# Patient Record
Sex: Male | Born: 1960 | Race: Black or African American | Hispanic: No | Marital: Married | Smoking: Current some day smoker
Health system: Southern US, Community
[De-identification: ages and names within clinical notes are randomized; demographics above are authoritative.]

## PROBLEM LIST (undated history)

## (undated) DIAGNOSIS — T7840XA Allergy, unspecified, initial encounter: Secondary | ICD-10-CM

## (undated) DIAGNOSIS — M1990 Unspecified osteoarthritis, unspecified site: Secondary | ICD-10-CM

## (undated) DIAGNOSIS — I10 Essential (primary) hypertension: Secondary | ICD-10-CM

## (undated) DIAGNOSIS — I639 Cerebral infarction, unspecified: Secondary | ICD-10-CM

## (undated) DIAGNOSIS — E119 Type 2 diabetes mellitus without complications: Secondary | ICD-10-CM

## (undated) HISTORY — DX: Unspecified osteoarthritis, unspecified site: M19.90

## (undated) HISTORY — DX: Allergy, unspecified, initial encounter: T78.40XA

## (undated) HISTORY — PX: FRACTURE SURGERY: SHX138

## (undated) HISTORY — DX: Essential (primary) hypertension: I10

## (undated) HISTORY — DX: Type 2 diabetes mellitus without complications: E11.9

---

## 2000-12-17 ENCOUNTER — Emergency Department (HOSPITAL_COMMUNITY): Admission: EM | Admit: 2000-12-17 | Discharge: 2000-12-17 | Payer: Self-pay | Admitting: Emergency Medicine

## 2001-05-11 ENCOUNTER — Emergency Department (HOSPITAL_COMMUNITY): Admission: EM | Admit: 2001-05-11 | Discharge: 2001-05-11 | Payer: Self-pay | Admitting: Emergency Medicine

## 2007-08-09 ENCOUNTER — Ambulatory Visit (HOSPITAL_COMMUNITY): Payer: Self-pay | Admitting: Marriage and Family Therapist

## 2007-12-04 ENCOUNTER — Emergency Department (HOSPITAL_COMMUNITY): Admission: EM | Admit: 2007-12-04 | Discharge: 2007-12-04 | Payer: Self-pay | Admitting: Emergency Medicine

## 2013-04-09 ENCOUNTER — Emergency Department (INDEPENDENT_AMBULATORY_CARE_PROVIDER_SITE_OTHER)
Admission: EM | Admit: 2013-04-09 | Discharge: 2013-04-09 | Disposition: A | Discharge status: Home / Self Care | Payer: Self-pay | Source: Home / Self Care | Attending: Emergency Medicine | Admitting: Emergency Medicine

## 2013-04-09 ENCOUNTER — Encounter (HOSPITAL_COMMUNITY): Payer: Self-pay | Admitting: Emergency Medicine

## 2013-04-09 DIAGNOSIS — T148XXA Other injury of unspecified body region, initial encounter: Secondary | ICD-10-CM

## 2013-04-09 DIAGNOSIS — Z23 Encounter for immunization: Secondary | ICD-10-CM

## 2013-04-09 DIAGNOSIS — IMO0002 Reserved for concepts with insufficient information to code with codable children: Secondary | ICD-10-CM

## 2013-04-09 MED ORDER — OXYCODONE-ACETAMINOPHEN 5-325 MG PO TABS: ORAL_TABLET | ORAL | Status: DC

## 2013-04-09 MED ORDER — HYDROCODONE-ACETAMINOPHEN 5-325 MG PO TABS
2.0000 | ORAL_TABLET | Freq: Once | ORAL | Status: AC
  Administered 2013-04-09: 2 via ORAL

## 2013-04-09 MED ORDER — AMOXICILLIN 500 MG PO CAPS: 500.0000 mg | ORAL_CAPSULE | Freq: Three times a day (TID) | ORAL | Status: DC

## 2013-04-09 MED ORDER — HYDROCODONE-ACETAMINOPHEN 5-325 MG PO TABS
ORAL_TABLET | ORAL | Status: AC
  Filled 2013-04-09: qty 2

## 2013-04-09 MED ORDER — TETANUS-DIPHTH-ACELL PERTUSSIS 5-2.5-18.5 LF-MCG/0.5 IM SUSP
0.5000 mL | Freq: Once | INTRAMUSCULAR | Status: AC
  Administered 2013-04-09: 0.5 mL via INTRAMUSCULAR

## 2013-04-09 MED ORDER — TETANUS-DIPHTH-ACELL PERTUSSIS 5-2.5-18.5 LF-MCG/0.5 IM SUSP
INTRAMUSCULAR | Status: AC
  Filled 2013-04-09: qty 0.5

## 2013-04-09 NOTE — Discharge Instructions (Signed)
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
## 2013-04-15 ENCOUNTER — Encounter (HOSPITAL_COMMUNITY): Payer: Self-pay | Admitting: Emergency Medicine

## 2013-04-15 ENCOUNTER — Emergency Department (INDEPENDENT_AMBULATORY_CARE_PROVIDER_SITE_OTHER)
Admission: EM | Admit: 2013-04-15 | Discharge: 2013-04-15 | Disposition: A | Discharge status: Home / Self Care | Payer: Self-pay | Source: Home / Self Care | Attending: Emergency Medicine | Admitting: Emergency Medicine

## 2013-04-15 DIAGNOSIS — S01511A Laceration without foreign body of lip, initial encounter: Secondary | ICD-10-CM

## 2013-04-15 DIAGNOSIS — Z4802 Encounter for removal of sutures: Secondary | ICD-10-CM

## 2013-04-15 MED ORDER — OXYCODONE-ACETAMINOPHEN 5-325 MG PO TABS: ORAL_TABLET | ORAL | Status: DC

## 2013-04-15 MED ORDER — CLINDAMYCIN HCL 300 MG PO CAPS: 300.0000 mg | ORAL_CAPSULE | Freq: Four times a day (QID) | ORAL | Status: DC
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
## 2013-04-15 NOTE — Discharge Instructions (Signed)
Wash with soap and water. Do not apply antibiotic ointment.   °

## 2013-04-15 NOTE — ED Notes (Signed)
Pt is here for a f/u and to have stitches removed Voices no new concerns... Alert w/no signs of acute distress.

## 2014-01-27 ENCOUNTER — Encounter (HOSPITAL_COMMUNITY): Payer: Self-pay | Admitting: Emergency Medicine

## 2014-01-27 ENCOUNTER — Emergency Department (HOSPITAL_COMMUNITY)
Admission: EM | Admit: 2014-01-27 | Discharge: 2014-01-28 | Disposition: A | Discharge status: Home / Self Care | Payer: Self-pay | Attending: Emergency Medicine | Admitting: Emergency Medicine

## 2014-01-27 ENCOUNTER — Emergency Department (HOSPITAL_COMMUNITY): Payer: Self-pay

## 2014-01-27 DIAGNOSIS — R109 Unspecified abdominal pain: Secondary | ICD-10-CM | POA: Insufficient documentation

## 2014-01-27 DIAGNOSIS — Z79899 Other long term (current) drug therapy: Secondary | ICD-10-CM | POA: Insufficient documentation

## 2014-01-27 DIAGNOSIS — K5900 Constipation, unspecified: Secondary | ICD-10-CM | POA: Insufficient documentation

## 2014-01-27 DIAGNOSIS — I10 Essential (primary) hypertension: Secondary | ICD-10-CM | POA: Insufficient documentation

## 2014-01-27 LAB — CBC WITH DIFFERENTIAL/PLATELET
Basophils Absolute: 0.1 10*3/uL (ref 0.0–0.1)
Basophils Relative: 1 % (ref 0–1)
Eosinophils Absolute: 0.2 10*3/uL (ref 0.0–0.7)
Eosinophils Relative: 2 % (ref 0–5)
HCT: 43.2 % (ref 39.0–52.0)
Hemoglobin: 14.5 g/dL (ref 13.0–17.0)
LYMPHS ABS: 3.3 10*3/uL (ref 0.7–4.0)
LYMPHS PCT: 35 % (ref 12–46)
MCH: 29.5 pg (ref 26.0–34.0)
MCHC: 33.6 g/dL (ref 30.0–36.0)
MCV: 88 fL (ref 78.0–100.0)
Monocytes Absolute: 1 10*3/uL (ref 0.1–1.0)
Monocytes Relative: 10 % (ref 3–12)
NEUTROS ABS: 4.9 10*3/uL (ref 1.7–7.7)
NEUTROS PCT: 52 % (ref 43–77)
PLATELETS: 343 10*3/uL (ref 150–400)
RBC: 4.91 MIL/uL (ref 4.22–5.81)
RDW: 13.6 % (ref 11.5–15.5)
WBC: 9.3 10*3/uL (ref 4.0–10.5)

## 2014-01-27 LAB — COMPREHENSIVE METABOLIC PANEL
ALK PHOS: 84 U/L (ref 39–117)
ALT: 16 U/L (ref 0–53)
AST: 15 U/L (ref 0–37)
Albumin: 3.8 g/dL (ref 3.5–5.2)
Anion gap: 13 (ref 5–15)
BUN: 6 mg/dL (ref 6–23)
CO2: 26 meq/L (ref 19–32)
Calcium: 9.1 mg/dL (ref 8.4–10.5)
Chloride: 98 mEq/L (ref 96–112)
Creatinine, Ser: 0.77 mg/dL (ref 0.50–1.35)
GLUCOSE: 282 mg/dL — AB (ref 70–99)
POTASSIUM: 3.9 meq/L (ref 3.7–5.3)
SODIUM: 137 meq/L (ref 137–147)
Total Bilirubin: 0.2 mg/dL — ABNORMAL LOW (ref 0.3–1.2)
Total Protein: 7.5 g/dL (ref 6.0–8.3)

## 2014-01-27 LAB — LIPASE, BLOOD: Lipase: 63 U/L — ABNORMAL HIGH (ref 11–59)

## 2014-01-27 MED ORDER — DICYCLOMINE HCL 10 MG/ML IM SOLN
20.0000 mg | Freq: Once | INTRAMUSCULAR | Status: AC
  Administered 2014-01-27: 20 mg via INTRAMUSCULAR
  Filled 2014-01-27: qty 2

## 2014-01-27 MED ORDER — MORPHINE SULFATE 4 MG/ML IJ SOLN
4.0000 mg | Freq: Once | INTRAMUSCULAR | Status: AC
  Administered 2014-01-27: 4 mg via INTRAVENOUS

## 2014-01-27 MED ORDER — MORPHINE SULFATE 4 MG/ML IJ SOLN
4.0000 mg | Freq: Once | INTRAMUSCULAR | Status: DC
  Filled 2014-01-27: qty 1

## 2014-01-27 NOTE — ED Provider Notes (Signed)
CSN: [id number]     Arrival date & time 01/27/14  2145 History   First MD Initiated Contact with Patient 01/27/14 2205     Chief Complaint  Patient presents with  . Abdominal Pain  . Constipation     (Consider location/radiation/quality/duration/timing/severity/associated sxs/prior Treatment) HPI Comments: Patient is an otherwise healthy 53 year old male presenting to the emergency department for complaint of left-sided waxing and waning sharp cramping abdominal pain. He states his symptoms all began 1 week ago when he became constipated. He states he has attempted to use a laxative and stool softener as advised for previous bouts of constipation. He states he has had some loose nonbloody bowel movements but continues to feel constipated and continued to have the abdominal pain. He states that his abdominal pain worsens when bearing down to have a bowel movement.no abdominal surgical history.  Patient is a 53 y.o. male presenting with abdominal pain and constipation.  Abdominal Pain Associated symptoms: constipation   Associated symptoms: no nausea and no vomiting   Constipation Associated symptoms: abdominal pain   Associated symptoms: no nausea and no vomiting     History reviewed. No pertinent past medical history. History reviewed. No pertinent past surgical history. History reviewed. No pertinent family history. History  Substance Use Topics  . Smoking status: Never Smoker   . Smokeless tobacco: Not on file  . Alcohol Use: Yes     Comment: occasionally    Review of Systems  Gastrointestinal: Positive for abdominal pain and constipation. Negative for nausea and vomiting.  All other systems reviewed and are negative.     Allergies  Review of patient's allergies indicates no known allergies.  Home Medications   Prior to Admission medications   Medication Sig Start Date End Date Taking? Authorizing Provider  acetaminophen (TYLENOL) 500 MG tablet Take 1,000 mg by mouth  every 6 (six) hours as needed (pain).   Yes Historical Provider, MD  amoxicillin (AMOXIL) 500 MG capsule Take 1 capsule (500 mg total) by mouth 3 (three) times daily. Patient not taking: Reported on 01/27/2014 04/09/13   Reuben Likesavid C Keller, MD  clindamycin (CLEOCIN) 300 MG capsule Take 1 capsule (300 mg total) by mouth 4 (four) times daily. Patient not taking: Reported on 01/27/2014 04/15/13   Reuben Likesavid C Keller, MD  dicyclomine (BENTYL) 20 MG tablet Take 1 tablet (20 mg total) by mouth 2 (two) times daily. 01/28/14   Wallis Spizzirri L Shandell Jallow, PA-C  docusate sodium (COLACE) 100 MG capsule Take 1 capsule (100 mg total) by mouth every 12 (twelve) hours. 01/28/14   Ainsleigh Kakos L Diante Barley, PA-C  oxyCODONE-acetaminophen (PERCOCET) 5-325 MG per tablet 1 to 2 tablets every 6 hours as needed for pain. Patient not taking: Reported on 01/27/2014 04/09/13   Reuben Likesavid C Keller, MD  oxyCODONE-acetaminophen (PERCOCET) 5-325 MG per tablet 1 to 2 tablets every 6 hours as needed for pain. Patient not taking: Reported on 01/27/2014 04/15/13   Reuben Likesavid C Keller, MD  polyethylene glycol powder (GLYCOLAX/MIRALAX) powder Take 17 g by mouth 2 (two) times daily. Until daily soft stools  OTC 01/28/14   Trannie Bardales L Kielyn Kardell, PA-C   BP 162/77 mmHg  Pulse 82  Temp(Src) 98.9 F (37.2 C) (Oral)  Resp   SpO2 99% Physical Exam  Constitutional: He is oriented to person, place, and time. He appears well-developed and well-nourished. No distress.  HENT:  Head: Normocephalic and atraumatic.  Right Ear: External ear normal.  Left Ear: External ear normal.  Nose: Nose normal.  Mouth/Throat: Oropharynx is  clear and moist.  Eyes: Conjunctivae are normal.  Neck: Normal range of motion. Neck supple.  Cardiovascular: Normal rate, regular rhythm and normal heart sounds.   Pulmonary/Chest: Effort normal and breath sounds normal.  Abdominal: Soft. He exhibits no distension. There is no tenderness. There is no rebound and no guarding.  Musculoskeletal:  Normal range of motion.  Neurological: He is alert and oriented to person, place, and time.  Skin: Skin is warm and dry. He is not diaphoretic.  Psychiatric: He has a normal mood and affect.  Nursing note and vitals reviewed.   ED Course  Procedures (including critical care time) Medications  dicyclomine (BENTYL) injection 20 mg (20 mg Intramuscular Given 01/27/14 2227)  morphine 4 MG/ML injection 4 mg (4 mg Intravenous Given 01/27/14 2326)    Labs Review Labs Reviewed  COMPREHENSIVE METABOLIC PANEL - Abnormal; Notable for the following:    Glucose, Bld 282 (*)    Total Bilirubin 0.2 (*)    All other components within normal limits  LIPASE, BLOOD - Abnormal; Notable for the following:    Lipase 63 (*)    All other components within normal limits  CBC WITH DIFFERENTIAL    Imaging Review Dg Abd 1 View  01/27/2014   CLINICAL DATA:  Sharp pain in the lateral left abdomen for 1 week. Constipation. Last bowel movement 1 week ago.  EXAM: ABDOMEN - 1 VIEW  COMPARISON:  None.  FINDINGS: Scattered gas and stool in the colon. No small or large bowel distention. No radiopaque stones. Mild degenerative changes in the spine and hips.  IMPRESSION: Nonobstructive bowel gas pattern.   Electronically Signed   By: Burman NievesWilliam  Stevens M.D.   On: 01/27/2014 23:47     EKG Interpretation None      MDM   Final diagnoses:  Abdominal pain  Constipation, unspecified constipation type  Essential hypertension    Filed Vitals:   01/27/14 2330  BP: 162/77  Pulse: 82  Temp:    Afebrile, NAD, non-toxic appearing, AAOx4.  Patient is nontoxic, nonseptic appearing, in no apparent distress.  Patient's pain and other symptoms adequately managed in emergency department. PO tolerated in emergency department without difficulty..  Labs, imaging and vitals reviewed.  Patient does not meet the SIRS or Sepsis criteria.  On repeat exam patient does not have a surgical abdomen and there are no peritoneal signs.  No  indication of appendicitis, bowel obstruction, bowel perforation, cholecystitis, diverticulitis.history and physical exam consistent with constipation. Mild elevation in lipase noted. Patient is not having any epigastric abdominal pain currently to discuss this with the patient advised a clear liquid diet for 1-2 days with follow-up with his PCP to ensure correction of lipase. Patient also noted to be hypertensive in the emergency department, at this time there is no evidence of hypertensive urgency or emergency. Discussed he needed a recheck in 1-2 weeks by his primary care physician. Patient discharged home with symptomatic treatment and given strict instructions for follow-up with their primary care physician.  I have also discussed reasons to return immediately to the ER.  Patient expresses understanding and agrees with plan.       Jeannetta EllisJennifer L Adya Wirz, PA-C 01/28/14 0051  Gerhard Munchobert Lockwood, MD 01/28/14 1520

## 2014-01-27 NOTE — ED Notes (Signed)
Patient arrives with complaint of abdominal pain and constipation. Explains that the pain began about 1 week ago and he has been taking laxatives but hasn't had a regular bowel movement in about 1 week. Denies nausea and vomiting, but states that he has had little appetite. States he ate 4 chicken wings today before feeling full. Pain is in LUQ.

## 2014-01-27 NOTE — ED Notes (Signed)
Patient returned from X-ray 

## 2014-01-27 NOTE — ED Notes (Signed)
Family at bedside. 

## 2014-01-28 MED ORDER — DICYCLOMINE HCL 20 MG PO TABS: 20.0000 mg | ORAL_TABLET | Freq: Two times a day (BID) | ORAL | Status: DC

## 2014-01-28 MED ORDER — DOCUSATE SODIUM 100 MG PO CAPS: 100.0000 mg | ORAL_CAPSULE | Freq: Two times a day (BID) | ORAL | Status: DC

## 2014-01-28 MED ORDER — POLYETHYLENE GLYCOL 3350 17 GM/SCOOP PO POWD: 17.0000 g | Freq: Two times a day (BID) | ORAL | Status: DC

## 2014-01-28 NOTE — Discharge Instructions (Signed)
Please follow up with your primary care physician in 1-2 days. If you do not have one please call [facility] listed above. Please take medications as prescribed. Please read all discharge instructions and return precautions.    Constipation Constipation is when a person has fewer than three bowel movements a week, has difficulty having a bowel movement, or has stools that are dry, hard, or larger than normal. As people grow older, constipation is more common. If you try to fix constipation with medicines that make you have a bowel movement (laxatives), the problem may get worse. Long-term laxative use may cause the muscles of the colon to become weak. A low-fiber diet, not taking in enough fluids, and taking certain medicines may make constipation worse.  CAUSES   Certain medicines, such as antidepressants, pain medicine, iron supplements, antacids, and water pills.   Certain diseases, such as diabetes, irritable bowel syndrome (IBS), thyroid disease, or depression.   Not drinking enough water.   Not eating enough fiber-rich foods.   Stress or travel.   Lack of physical activity or exercise.   Ignoring the urge to have a bowel movement.   Using laxatives too much.  SIGNS AND SYMPTOMS   Having fewer than three bowel movements a week.   Straining to have a bowel movement.   Having stools that are hard, dry, or larger than normal.   Feeling full or bloated.   Pain in the lower abdomen.   Not feeling relief after having a bowel movement.  DIAGNOSIS  Your health care provider will take a medical history and perform a physical exam. Further testing may be done for severe constipation. Some tests may include:  A barium enema X-ray to examine your rectum, colon, and, sometimes, your small intestine.   A sigmoidoscopy to examine your lower colon.   A colonoscopy to examine your entire colon. TREATMENT  Treatment will depend on the  severity of your constipation and what is causing it. Some dietary treatments include drinking more fluids and eating more fiber-rich foods. Lifestyle treatments may include regular exercise. If these diet and lifestyle recommendations do not help, your health care provider may recommend taking over-the-counter laxative medicines to help you have bowel movements. Prescription medicines may be prescribed if over-the-counter medicines do not work.  HOME CARE INSTRUCTIONS   Eat foods that have a lot of fiber, such as fruits, vegetables, whole grains, and beans.  Limit foods high in fat and processed sugars, such as french fries, hamburgers, cookies, candies, and soda.   A fiber supplement may be added to your diet if you cannot get enough fiber from foods.   Drink enough fluids to keep your urine clear or pale yellow.   Exercise regularly or as directed by your health care provider.   Go to the restroom when you have the urge to go. Do not hold it.   Only take over-the-counter or prescription medicines as directed by your health care provider. Do not take other medicines for constipation without talking to your health care provider first.  SEEK IMMEDIATE MEDICAL CARE IF:   You have bright red blood in your stool.   Your constipation lasts for more than 4 days or gets worse.   You have abdominal or rectal pain.   You have thin, pencil-like stools.   You have unexplained weight loss. MAKE SURE YOU:   Understand these instructions.  Will watch your condition.  Will get help right away if you  are not doing well or get worse. Document Released: 12/06/2003 Document Revised: 03/14/2013 Document Reviewed: 12/19/2012 [facility] Patient Information 2015 [place]. This information is not intended to replace advice given to you by your health care provider. Make sure you discuss any questions you have with your health care provider.  High-Fiber Diet Fiber is found in fruits,  vegetables, and grains. A high-fiber diet encourages the addition of more whole grains, legumes, fruits, and vegetables in your diet. The recommended amount of fiber for adult males is 38 g per day. For adult females, it is 25 g per day. Pregnant and lactating women should get 28 g of fiber per day. If you have a digestive or bowel problem, ask your caregiver for advice before adding high-fiber foods to your diet. Eat a variety of high-fiber foods instead of only a select few type of foods.  PURPOSE  To increase stool bulk.  To make bowel movements more regular to prevent constipation.  To lower cholesterol.  To prevent overeating. WHEN IS THIS DIET USED?  It may be used if you have constipation and hemorrhoids.  It may be used if you have uncomplicated diverticulosis (intestine condition) and irritable bowel syndrome.  It may be used if you need help with weight management.  It may be used if you want to add it to your diet as a protective measure against atherosclerosis, diabetes, and cancer. SOURCES OF FIBER  Whole-grain breads and cereals.  Fruits, such as apples, oranges, bananas, berries, prunes, and pears.  Vegetables, such as green peas, carrots, sweet potatoes, beets, broccoli, cabbage, spinach, and artichokes.  Legumes, such split peas, soy, lentils.  Almonds. FIBER CONTENT IN FOODS Starches and Grains / Dietary Fiber (g)  Cheerios, 1 cup / 3 g  Corn Flakes cereal, 1 cup / 0.7 g  Rice crispy treat cereal, 1 cup / 0.3 g  Instant oatmeal (cooked),  cup / 2 g  Frosted wheat cereal, 1 cup / 5.1 g  Brown, long-grain rice (cooked), 1 cup / 3.5 g  White, long-grain rice (cooked), 1 cup / 0.6 g  Enriched macaroni (cooked), 1 cup / 2.5 g Legumes / Dietary Fiber (g)  Baked beans (canned, plain, or vegetarian),  cup / 5.2 g  Kidney beans (canned),  cup / 6.8 g  Pinto beans (cooked),  cup / 5.5 g Breads and Crackers / Dietary Fiber (g)  Plain or honey  graham crackers, 2 squares / 0.7 g  Saltine crackers, 3 squares / 0.3 g  Plain, salted pretzels, 10 pieces / 1.8 g  Whole-wheat bread, 1 slice / 1.9 g  White bread, 1 slice / 0.7 g  Raisin bread, 1 slice / 1.2 g  Plain bagel, 3 oz / 2 g  Flour tortilla, 1 oz / 0.9 g  Corn tortilla, 1 small / 1.5 g  Hamburger or hotdog bun, 1 small / 0.9 g Fruits / Dietary Fiber (g)  Apple with skin, 1 medium / 4.4 g  Sweetened applesauce,  cup / 1.5 g  Banana,  medium / 1.5 g  Grapes, 10 grapes / 0.4 g  Orange, 1 small / 2.3 g  Raisin, 1.5 oz / 1.6 g  Melon, 1 cup / 1.4 g Vegetables / Dietary Fiber (g)  Green beans (canned),  cup / 1.3 g  Carrots (cooked),  cup / 2.3 g  Broccoli (cooked),  cup / 2.8 g  Peas (cooked),  cup / 4.4 g  Mashed potatoes,  cup / 1.6 g  Lettuce, 1 cup / 0.5 g  Corn (canned),  cup / 1.6 g  Tomato,  cup / 1.1 g Document Released: 03/09/2005 Document Revised: 09/08/2011 Document Reviewed: 06/11/2011 [facility] Patient Information 2015 [place], [place]. This information is not intended to replace advice given to you by your health care provider. Make sure you discuss any questions you have with your health care provider.

## 2015-08-07 ENCOUNTER — Encounter (HOSPITAL_COMMUNITY): Payer: Self-pay | Admitting: Emergency Medicine

## 2015-08-07 ENCOUNTER — Ambulatory Visit (HOSPITAL_COMMUNITY)
Admission: EM | Admit: 2015-08-07 | Discharge: 2015-08-07 | Disposition: A | Discharge status: Home / Self Care | Payer: BLUE CROSS/BLUE SHIELD | Attending: Emergency Medicine | Admitting: Emergency Medicine

## 2015-08-07 DIAGNOSIS — J029 Acute pharyngitis, unspecified: Secondary | ICD-10-CM | POA: Insufficient documentation

## 2015-08-07 DIAGNOSIS — R21 Rash and other nonspecific skin eruption: Secondary | ICD-10-CM | POA: Diagnosis not present

## 2015-08-07 LAB — POCT RAPID STREP A: Streptococcus, Group A Screen (Direct): NEGATIVE

## 2015-08-07 MED ORDER — CLOTRIMAZOLE-BETAMETHASONE 1-0.05 % EX CREA: TOPICAL_CREAM | CUTANEOUS | Status: DC

## 2015-08-07 MED ORDER — AMOXICILLIN 500 MG PO CAPS: 500.0000 mg | ORAL_CAPSULE | Freq: Three times a day (TID) | ORAL | Status: DC

## 2015-08-07 NOTE — ED Notes (Signed)
Pt has been suffering from a sore throat, nasal congestion, and a cough for 2-3 days.  Pt denies any fever.  Pt also has a rash bilaterally on his upper thighs and a few spots on his forearms, bilaterally for the last 2-3 months.  He states his brother has been in the hospital for 10 months and has MRSA and he is concerned the rash may be MRSA as well.

## 2015-08-07 NOTE — ED Provider Notes (Signed)
CSN: [id number]     Arrival date & time 08/07/15  1257 History   First MD Initiated Contact with Patient 08/07/15 1316     Chief Complaint  Patient presents with  . URI  . Rash   (Consider location/radiation/quality/duration/timing/severity/associated sxs/prior Treatment)  HPI He is a 55 year old man here for evaluation of sore throat. He states for the last 2-3 days he has had a sore throat, worse with swallowing. This is associated with nasal congestion and cough. He reports some night sweats, but denies fevers. No nausea or vomiting. 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  He also reports a rash on his thighs and arms. He states it's been there 1-2 months. It is not particularly itchy. Some of them are red bumps and others are scaly or plaques. He has tried putting Nizoral shampoo on them without improvement. He is concerned about MRSA as his brother has MRSA in the hospital.  History reviewed. No pertinent past medical history. History reviewed. No pertinent past surgical history. History reviewed. No pertinent family history. Social History  Substance Use Topics  . Smoking status: Never Smoker   . Smokeless tobacco: None  . Alcohol Use: Yes     Comment: occasionally    Review of Systems As in history of present illness Allergies  Review of patient's allergies indicates no known allergies.  Home Medications   Prior to Admission medications   Medication Sig Start Date End Date Taking? Authorizing Provider  acetaminophen (TYLENOL) 500 MG tablet Take 1,000 mg by mouth every 6 (six) hours as needed (pain).    Historical Provider, MD  amoxicillin (AMOXIL) 500 MG capsule Take 1 capsule (500 mg total) by mouth 3 (three) times daily. 08/07/15   Charm RingsErin J Azaryah Oleksy, MD  clotrimazole-betamethasone (LOTRISONE) cream Apply to affected area 2 times daily 08/07/15   Charm RingsErin J Calix Heinbaugh, MD  dicyclomine (BENTYL) 20 MG tablet Take 1 tablet (20 mg total) by mouth 2 (two)  times daily. 01/28/14   Jennifer Piepenbrink, PA-C  docusate sodium (COLACE) 100 MG capsule Take 1 capsule (100 mg total) by mouth every 12 (twelve) hours. 01/28/14   Jennifer Piepenbrink, PA-C  polyethylene glycol powder (GLYCOLAX/MIRALAX) powder Take 17 g by mouth 2 (two) times daily. Until daily soft stools  OTC 01/28/14   Francee PiccoloJennifer Piepenbrink, PA-C   Meds Ordered and Administered this Visit  Medications - No data to display  BP 150/85 mmHg  Pulse 96  Temp(Src) 98.5 F (36.9 C) (Oral)  SpO2 96% No data found.   Physical Exam  Constitutional: He is oriented to person, place, and time. He appears well-developed and well-nourished. No distress.  HENT:  Mouth/Throat: No oropharyngeal exudate.  Oropharynx with beefy erythema. Nasal discharge present. Nasal mucosa is nonerythematous. TMs normal bilaterally.  Neck: Neck supple.  Cardiovascular: Normal rate, regular rhythm and normal heart sounds.   No murmur heard. Pulmonary/Chest: Effort normal and breath sounds normal. No respiratory distress. He has no wheezes. He has no rales.  Lymphadenopathy:    He has no cervical adenopathy.  Neurological: He is alert and oriented to person, place, and time.  Skin:  He has 2 small erythematous papules on the right thigh. He also has a 1 x 1 cm scaly plaque in the right proximal thigh. This is a deep red color. He has a half centimeter lesion on the right forearm that is slightly erythematous and has a slight shine to it. He has a similar lesion  on the left forearm.    ED Course  Procedures (including critical care time)  Labs Review Labs Reviewed  POCT RAPID STREP A    Imaging Review No results found.   MDM   1. Pharyngitis   2. 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 Some of the spots appear to be bug bites. Some may be fungal. Trial of clotrimazole/betamethasone cream. If not improving, follow-up with  dermatology.    Charm Rings, MD 08/07/15 [phone]

## 2015-08-07 NOTE — Discharge Instructions (Signed)
Rapid strep test is negative. Given your work and how your throat looks, we are going to start you on antibiotics just in case. Take amoxicillin 3 times a day for 10 days. We will call you with your culture results in 2-3 days.  The rash does not look like MRSA. Some of them look like bug bites and others look like they might be a fungal infection.  Use the clotrimazole-betamethasone cream twice a day for the next week. If this is not improving, you will need to see a dermatologist.

## 2015-08-10 LAB — CULTURE, GROUP A STREP (THRC)

## 2017-03-17 ENCOUNTER — Emergency Department (HOSPITAL_COMMUNITY): Payer: Self-pay

## 2017-03-17 ENCOUNTER — Encounter (HOSPITAL_COMMUNITY): Payer: Self-pay | Admitting: Emergency Medicine

## 2017-03-17 ENCOUNTER — Emergency Department (HOSPITAL_COMMUNITY)
Admission: EM | Admit: 2017-03-17 | Discharge: 2017-03-17 | Disposition: A | Discharge status: Home / Self Care | Payer: Self-pay | Attending: Physician Assistant | Admitting: Physician Assistant

## 2017-03-17 ENCOUNTER — Other Ambulatory Visit: Payer: Self-pay

## 2017-03-17 DIAGNOSIS — Z79899 Other long term (current) drug therapy: Secondary | ICD-10-CM | POA: Insufficient documentation

## 2017-03-17 DIAGNOSIS — J069 Acute upper respiratory infection, unspecified: Secondary | ICD-10-CM | POA: Insufficient documentation

## 2017-03-17 LAB — CBC WITH DIFFERENTIAL/PLATELET
BASOS ABS: 0 10*3/uL (ref 0.0–0.1)
Basophils Relative: 0 %
Eosinophils Absolute: 0.3 10*3/uL (ref 0.0–0.7)
Eosinophils Relative: 3 %
HEMATOCRIT: 43.2 % (ref 39.0–52.0)
Hemoglobin: 14.5 g/dL (ref 13.0–17.0)
LYMPHS ABS: 2.7 10*3/uL (ref 0.7–4.0)
LYMPHS PCT: 22 %
MCH: 31.2 pg (ref 26.0–34.0)
MCHC: 33.6 g/dL (ref 30.0–36.0)
MCV: 92.9 fL (ref 78.0–100.0)
MONO ABS: 1.3 10*3/uL — AB (ref 0.1–1.0)
MONOS PCT: 11 %
NEUTROS ABS: 7.7 10*3/uL (ref 1.7–7.7)
Neutrophils Relative %: 64 %
Platelets: 259 10*3/uL (ref 150–400)
RBC: 4.65 MIL/uL (ref 4.22–5.81)
RDW: 14 % (ref 11.5–15.5)
WBC: 12 10*3/uL — ABNORMAL HIGH (ref 4.0–10.5)

## 2017-03-17 LAB — COMPREHENSIVE METABOLIC PANEL
ALBUMIN: 3.8 g/dL (ref 3.5–5.0)
ALT: 45 U/L (ref 17–63)
ANION GAP: 11 (ref 5–15)
AST: 37 U/L (ref 15–41)
Alkaline Phosphatase: 86 U/L (ref 38–126)
BUN: 5 mg/dL — ABNORMAL LOW (ref 6–20)
CHLORIDE: 96 mmol/L — AB (ref 101–111)
CO2: 29 mmol/L (ref 22–32)
Calcium: 8.8 mg/dL — ABNORMAL LOW (ref 8.9–10.3)
Creatinine, Ser: 0.75 mg/dL (ref 0.61–1.24)
GFR calc Af Amer: >60 mL/min (ref >60)
GFR calc non Af Amer: >60 mL/min (ref >60)
GLUCOSE: 272 mg/dL — AB (ref 65–99)
POTASSIUM: 3.3 mmol/L — AB (ref 3.5–5.1)
SODIUM: 136 mmol/L (ref 135–145)
TOTAL PROTEIN: 7.5 g/dL (ref 6.5–8.1)
Total Bilirubin: 1.1 mg/dL (ref 0.3–1.2)

## 2017-03-17 LAB — I-STAT CG4 LACTIC ACID, ED: LACTIC ACID, VENOUS: 1.57 mmol/L (ref 0.5–1.9)

## 2017-03-17 MED ORDER — ALBUTEROL SULFATE HFA 108 (90 BASE) MCG/ACT IN AERS
2.0000 | INHALATION_SPRAY | Freq: Once | RESPIRATORY_TRACT | Status: AC
  Administered 2017-03-17: 2 via RESPIRATORY_TRACT
  Filled 2017-03-17: qty 6.7

## 2017-03-17 MED ORDER — AZITHROMYCIN 250 MG PO TABS
ORAL_TABLET | ORAL | 0 refills | Status: DC
Start: 2017-03-17 — End: 2019-10-02

## 2017-03-17 MED ORDER — AZITHROMYCIN 250 MG PO TABS
500.0000 mg | ORAL_TABLET | Freq: Once | ORAL | Status: AC
  Administered 2017-03-17: 500 mg via ORAL
  Filled 2017-03-17: qty 2

## 2017-03-17 MED ORDER — AEROCHAMBER PLUS FLO-VU MEDIUM MISC
1.0000 | Freq: Once | Status: DC
  Filled 2017-03-17: qty 1

## 2017-03-17 MED ORDER — GUAIFENESIN-CODEINE 100-10 MG/5ML PO SOLN: 5.0000 mL | Freq: Four times a day (QID) | ORAL | 0 refills | Status: DC | PRN

## 2017-03-17 NOTE — ED Notes (Signed)
Pt reports has been taking over the counter medication without relief.

## 2017-03-17 NOTE — ED Notes (Signed)
Patient is very hostile to nursing and tech staff. Patient states he is frustrated with wait and lack of personal attention. Patient states that [facility]" and [facility] Patient requests that he be allowed to leave at this time.

## 2017-03-17 NOTE — ED Triage Notes (Signed)
Pt to ER for 2-3 days for nasal congestion and productive cough with yellow/green sputum.

## 2017-03-17 NOTE — ED Provider Notes (Signed)
[facility] EMERGENCY DEPARTMENT Provider Note   CSN: [id number] Arrival date & time: 03/17/17  1238     History   Chief Complaint Chief Complaint  Patient presents with  . Cough  . Nasal Congestion    HPI HAYVEN FATIMA is a 56 y.o. male.  HPI   Patient is a 56 year old male presented with cough and congestion.  Patient's been sick for 3 weeks.  He reports he is been taking over the counter medicine.  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  He reports reports that he has been running himself ragged.  He says he had nasal congestion cough unable to sleep much because of the cough.  Mild headaches.  Denies any chest pain.  Patient travels from work but only locally.  No leg swelling  Also strange report of a family member dying from unknown causes in the middle the night.  Investigated by homicide.  History reviewed. No pertinent past medical history.  There are no active problems to display for this patient.   History reviewed. No pertinent surgical history.     Home Medications    Prior to Admission medications   Medication Sig Start Date End Date Taking? Authorizing Provider  Ascorbic Acid (VITAMIN C) 100 MG tablet Take 100 mg by mouth daily.   Yes [provider]  Phenylephrine-Pheniramine-DM [facility] COLD & COUGH) 01-10-19 MG PACK Take 1 packet by mouth daily as needed (flu like symptoms).   Yes [provider]  vitamin B-12 (CYANOCOBALAMIN) 100 MCG tablet Take 100 mcg by mouth daily.   Yes [provider]  amoxicillin (AMOXIL) 500 MG capsule Take 1 capsule (500 mg total) by mouth 3 (three) times daily. Patient not taking: Reported on 03/17/2017 08/07/15   Charm Rings, MD  clotrimazole-betamethasone Thurmond Butts) cream Apply to affected area 2 times daily Patient not taking: Reported on 03/17/2017 08/07/15   Charm Rings, MD  dicyclomine (BENTYL) 20 MG tablet Take 1 tablet (20 mg total)  by mouth 2 (two) times daily. 01/28/14   Piepenbrink, Victorino Dike, PA-C  docusate sodium (COLACE) 100 MG capsule Take 1 capsule (100 mg total) by mouth every 12 (twelve) hours. Patient not taking: Reported on 03/17/2017 01/28/14   Piepenbrink, Victorino Dike, PA-C  polyethylene glycol powder (GLYCOLAX/MIRALAX) powder Take 17 g by mouth 2 (two) times daily. Until daily soft stools  OTC Patient not taking: Reported on 03/17/2017 01/28/14   Francee Piccolo, PA-C    Family History History reviewed. No pertinent family history.  Social History Social History   Tobacco Use  . Smoking status: Never Smoker  . Smokeless tobacco: Never Used  Substance Use Topics  . Alcohol use: Yes    Comment: occasionally  . Drug use: No     Allergies   Patient has no known allergies.   Review of Systems Review of Systems  Constitutional: Positive for fatigue. Negative for activity change.  HENT: Positive for congestion.   Respiratory: Positive for cough and shortness of breath.   Cardiovascular: Negative for chest pain.  Gastrointestinal: Negative for abdominal pain.  All other systems reviewed and are negative.    Physical Exam Updated Vital Signs BP (!) 197/87 (BP Location: Right Arm)   Pulse (!) 108   Temp 98.7 F (37.1 C) (Oral)   Resp 16   SpO2 (!) 88% Comment: Pt sts slight difficulty breathing, but NAD. NF notified.   Physical Exam  Constitutional: He is oriented  to person, place, and time. He appears well-nourished.  HENT:  Head: Normocephalic.  Mouth/Throat: Oropharynx is clear and moist.  Eyes: Conjunctivae are normal.  Neck: No tracheal deviation present.  Cardiovascular:  Slight tachycardia.  Pulmonary/Chest: Effort normal. No stridor. No respiratory distress.  Normal effort.  Occasional wheeze.  Abdominal: Soft. There is no tenderness. There is no guarding.  Musculoskeletal: Normal range of motion. He exhibits no edema.  Neurological: He is oriented to person, place, and  time. No cranial nerve deficit.  Skin: Skin is warm and dry. No rash noted. He is not diaphoretic.  Psychiatric: He has a normal mood and affect. His behavior is normal.  Nursing note and vitals reviewed.    ED Treatments / Results  Labs (all labs ordered are listed, but only abnormal results are displayed) Labs Reviewed  COMPREHENSIVE METABOLIC PANEL - Abnormal; Notable for the following components:      Result Value   Potassium 3.3 (*)    Chloride 96 (*)    Glucose, Bld 272 (*)    BUN 5 (*)    Calcium 8.8 (*)    All other components within normal limits  CBC WITH DIFFERENTIAL/PLATELET - Abnormal; Notable for the following components:   WBC 12.0 (*)    Monocytes Absolute 1.3 (*)    All other components within normal limits  I-STAT CG4 LACTIC ACID, ED  I-STAT CG4 LACTIC ACID, ED    EKG  EKG Interpretation None       Radiology Dg Chest 2 View  Result Date: 03/17/2017 CLINICAL DATA:  Productive cough.  Shortness of breath. EXAM: CHEST  2 VIEW COMPARISON:  12/04/2007. FINDINGS: Mediastinum and hilar structures are normal. Cardiomegaly with mild pulmonary venous congestion. Low lung volumes with mild basilar atelectasis. No pleural effusion or pneumothorax. IMPRESSION: 1.  Cardiomegaly with mild pulmonary venous congestion. 2.  Mild left mid lung field subsegmental atelectasis. Electronically Signed   By: Maisie Fushomas  Register   On: 03/17/2017 13:16    Procedures Procedures (including critical care time)  Medications Ordered in ED Medications  albuterol (PROVENTIL HFA;VENTOLIN HFA) 108 (90 Base) MCG/ACT inhaler 2 puff (not administered)  azithromycin (ZITHROMAX) tablet 500 mg (not administered)  AEROCHAMBER PLUS FLO-VU MEDIUM MISC 1 each (not administered)     Initial Impression / Assessment and Plan / ED Course  I have reviewed the triage vital signs and the nursing notes.  Pertinent labs & imaging results that were available during my care of the patient were reviewed  by me and considered in my medical decision making (see chart for details).    Patient is a 56 year old male presented with cough and congestion.  Patient's been sick for 3 weeks.  He reports he is been taking over the counter medicine.  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  He reports reports that he has been running himself ragged.  He says he had nasal congestion cough unable to sleep much because of the cough.  Mild headaches.  Denies any chest pain.  Patient travels from work but only locally.  No leg swelling  Also strange report of a family member dying from unknown causes in the middle the night.  Investigated by homicide.  4:42 PM Do not suspect ACS or PE in this gentleman given the symptoms of congestion.  Will give patient cough medicine.  Will give inhaler for inhaler for symptoms.  We will treat him presumptively for bronchitis with azithro.  Patient still  tachycardic however does not want to wait for fluids or recheck.  Will allow him to go home with strict return precautions.  Final Clinical Impressions(s) / ED Diagnoses   Final diagnoses:  None    ED Discharge Orders    None       Yatzari Jonsson, Cindee Saltourteney Lyn, MD 03/17/17 1723

## 2017-03-17 NOTE — ED Notes (Signed)
Patient refused to sign discharge paper.

## 2017-03-17 NOTE — ED Notes (Signed)
Dahlia ClientHannah, RN at [facility] requested pt be placed on 2L O2 via nasal cannula. Pt O2 up to 94% and HR 102, RR 16.

## 2017-03-17 NOTE — ED Notes (Signed)
Patient had removed all cabling and has resisted getting more vitals. Talk with patient and he allowed us to take vitals.

## 2017-03-17 NOTE — Discharge Instructions (Signed)
We think you likely have a viral infection.  However going to give antibiotics in the case that is bronchitis.  Please use the inhaler every 4-6 hours as needed to help with symptoms.  Additionally use the cough syrup with codeine.  Make sure that no children use it.  Follow-up with your having any increase in symptoms.

## 2018-08-22 DIAGNOSIS — Z8616 Personal history of COVID-19: Secondary | ICD-10-CM | POA: Insufficient documentation

## 2018-09-14 DIAGNOSIS — I2510 Atherosclerotic heart disease of native coronary artery without angina pectoris: Secondary | ICD-10-CM | POA: Diagnosis present

## 2019-10-02 ENCOUNTER — Telehealth (INDEPENDENT_AMBULATORY_CARE_PROVIDER_SITE_OTHER): Payer: 59 | Admitting: Primary Care

## 2019-10-02 ENCOUNTER — Other Ambulatory Visit: Payer: Self-pay

## 2019-10-02 ENCOUNTER — Encounter (INDEPENDENT_AMBULATORY_CARE_PROVIDER_SITE_OTHER): Payer: Self-pay | Admitting: Primary Care

## 2019-10-02 DIAGNOSIS — E119 Type 2 diabetes mellitus without complications: Secondary | ICD-10-CM | POA: Diagnosis not present

## 2019-10-02 DIAGNOSIS — W57XXXA Bitten or stung by nonvenomous insect and other nonvenomous arthropods, initial encounter: Secondary | ICD-10-CM | POA: Diagnosis not present

## 2019-10-02 DIAGNOSIS — Z7689 Persons encountering health services in other specified circumstances: Secondary | ICD-10-CM

## 2019-10-02 NOTE — Progress Notes (Signed)
Virtual Visit via Telephone Note  I connected with Brent Young on 10/02/19 at  1:50 PM EDT by telephone and verified that I am speaking with the correct person using two identifiers.  I connected with Brent Young by  and verified that I am speaking with the correct person using two identifiers. Patient is at home I discussed the limitations, risks, security and privacy concerns of performing an evaluation and management service by telephone and the availability of in person appointments. I also discussed with the patient that there may be a patient responsible charge related to this service. The patient expressed understanding and agreed to proceed.  Gwinda Passe Np-C at office I History of Present Illness: [name] is a 59 year old male started trying to have a virtual visit but had to change to tele . He is establishing care . 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 He wanted to know the symptoms of [place]  fever- Asked him what was his symptoms none. Than I explain symptoms of a tick bite. Pt last A1c was 11/2018 = 10.0 No past medical history on file.  Current Outpatient Medications on File Prior to Visit  Medication Sig Dispense Refill  . amLODipine (NORVASC) 10 MG tablet Take 10 mg by mouth daily.    . Ascorbic Acid (VITAMIN C) 100 MG tablet Take 100 mg by mouth daily.    Marland Kitchen gabapentin (NEURONTIN) 300 MG capsule Take 300 mg by mouth 3 (three) times daily.    Marland Kitchen glimepiride (AMARYL) 2 MG tablet Take 2 mg by mouth every morning.    . hydrochlorothiazide (HYDRODIURIL) 25 MG tablet Take 25 mg by mouth daily.    Marland Kitchen losartan (COZAAR) 25 MG tablet Take 25 mg by mouth daily.    . metFORMIN (GLUCOPHAGE) 1000 MG tablet Take 1,000 mg by mouth 2 (two) times daily.     No current facility-administered medications on file prior to visit.   Observations/Objective: Review of Systems   Musculoskeletal:       Sore feet walked several acres     Assessment and Plan: Brent Young was seen today for new patient (initial visit) and edema.  Diagnoses and all orders for this visit:  Encounter to establish care Gwinda Passe, NP-C will be your  (PCP) she is mastered prepared . Able to diagnosed and treatment also  answer health concern as well as continuing care of varied medical conditions, not limited by cause, organ system, or diagnosis.   Tick bite, initial encounter Advised to wear long sox to his knees and high top boots to go to his knees and discussed s/s to be aware 1. You have symptoms of a disease, such as: ? Pain in a muscle, joint, or bone. ? Trouble walking or moving your legs. ? Numbness in your legs. ? Inability to move (paralysis). ? A red rash that makes a circle (bull's-eye rash). ? Redness and swelling where the tick bit you. ? A fever. ? Throwing up (vomiting) over and over. ? Diarrhea. ? Weight loss. ? Tender and swollen lymph glands. ? Shortness of breath. ? Cough. ? Belly pain (abdominal pain). ? Headache. ? Being more tired than normal. ? A change in how alert (conscious) you are. ? Confusion.  Type 2 diabetes mellitus without complication, without long-term current use of insulin (HCC) He currently has diabetes medications and states taking  as directed Last A1C was 10 a year ago indicating uncontrolled. Patient to schedule in person appoint for follow up and labs.Reminded to decrease foods that are high in carbohydrates are the following rice, potatoes, breads, sugars, and pastas.  Reduction in the intake (eating) will assist in lowering your blood sugars..    Follow Up Instructions:    I discussed the assessment and treatment plan with the patient. The patient was provided an opportunity to ask questions and all were answered. The patient agreed with the plan and demonstrated an understanding of the instructions.   The patient was  advised to call back or seek an in-person evaluation if the symptoms worsen or if the condition fails to improve as anticipated.  I provided 20 minutes of non-face-to-face time during this encounter.   Grayce Sessions, NP

## 2019-10-02 NOTE — Progress Notes (Signed)
What are the symptoms of [place] fever- pt believes he may have been bitten by a tik    Pt last A1c was 11/2018 = 10.0

## 2019-10-11 ENCOUNTER — Other Ambulatory Visit: Payer: Self-pay

## 2019-10-11 ENCOUNTER — Encounter (INDEPENDENT_AMBULATORY_CARE_PROVIDER_SITE_OTHER): Payer: Self-pay | Admitting: Primary Care

## 2019-10-11 ENCOUNTER — Ambulatory Visit (INDEPENDENT_AMBULATORY_CARE_PROVIDER_SITE_OTHER): Payer: 59 | Admitting: Primary Care

## 2019-10-11 VITALS — BP 146/83 | HR 97 | Temp 98.0°F | Ht 72.5 in | Wt 278.0 lb

## 2019-10-11 DIAGNOSIS — Z114 Encounter for screening for human immunodeficiency virus [HIV]: Secondary | ICD-10-CM

## 2019-10-11 DIAGNOSIS — Z125 Encounter for screening for malignant neoplasm of prostate: Secondary | ICD-10-CM

## 2019-10-11 DIAGNOSIS — B377 Candidal sepsis: Secondary | ICD-10-CM

## 2019-10-11 DIAGNOSIS — E119 Type 2 diabetes mellitus without complications: Secondary | ICD-10-CM | POA: Diagnosis not present

## 2019-10-11 DIAGNOSIS — Z1211 Encounter for screening for malignant neoplasm of colon: Secondary | ICD-10-CM

## 2019-10-11 DIAGNOSIS — Z1159 Encounter for screening for other viral diseases: Secondary | ICD-10-CM

## 2019-10-11 LAB — POCT GLYCOSYLATED HEMOGLOBIN (HGB A1C): Hemoglobin A1C: 10.1 % — AB (ref 4.0–5.6)

## 2019-10-11 LAB — GLUCOSE, POCT (MANUAL RESULT ENTRY): POC Glucose: 173 mg/dl — AB (ref 70–99)

## 2019-10-11 MED ORDER — HYDROCHLOROTHIAZIDE 25 MG PO TABS: 25.0000 mg | ORAL_TABLET | Freq: Every day | ORAL | 1 refills | Status: DC

## 2019-10-11 MED ORDER — LOSARTAN POTASSIUM 25 MG PO TABS: 25.0000 mg | ORAL_TABLET | Freq: Every day | ORAL | 1 refills | Status: DC

## 2019-10-11 MED ORDER — METFORMIN HCL 1000 MG PO TABS: 1000.0000 mg | ORAL_TABLET | Freq: Two times a day (BID) | ORAL | 1 refills | Status: DC

## 2019-10-11 MED ORDER — AMLODIPINE BESYLATE 10 MG PO TABS: 10.0000 mg | ORAL_TABLET | Freq: Every day | ORAL | 1 refills | Status: DC

## 2019-10-11 MED ORDER — OXCARBAZEPINE 300 MG PO TABS: 300.0000 mg | ORAL_TABLET | Freq: Three times a day (TID) | ORAL | 1 refills | Status: DC

## 2019-10-11 MED ORDER — JANUMET 50-1000 MG PO TABS: 1.0000 | ORAL_TABLET | Freq: Two times a day (BID) | ORAL | 1 refills | Status: DC

## 2019-10-11 MED ORDER — GLIMEPIRIDE 4 MG PO TABS: 4.0000 mg | ORAL_TABLET | Freq: Every morning | ORAL | 1 refills | Status: DC

## 2019-10-11 MED ORDER — FLUCONAZOLE 150 MG PO TABS: 150.0000 mg | ORAL_TABLET | Freq: Once | ORAL | 0 refills | Status: AC

## 2019-10-11 MED FILL — JANUMET 50-1,000 MG TABLET: 50-1000 | 30 days supply | Qty: 60 | Fill #0

## 2019-10-11 MED FILL — GLIMEPIRIDE 4 MG TABS: 4 | 90 days supply | Qty: 90 | Fill #0

## 2019-10-11 MED FILL — FLUCONAZOLE 150 MG TABLET: 150 | 1 days supply | Qty: 1 | Fill #0

## 2019-10-11 NOTE — Patient Instructions (Signed)
How is vertigo treated?--If your doctor knows what is causing your vertigo, he or she will probably try to treat that problem directly. For instance, if you have calcium deposits in your inner ear, the doctor might try to get them out by moving your head in a specific way. Your doctor can also give you medicines that might help your vertigo and relieve nausea and vomiting. If your vertigo is really bad, your doctor might also suggest a treatment called "balance rehabilitation." This treatment teaches you exercises that can help you cope with your vertigo.  What can I do on my own to deal with my vertigo?--If you have trouble standing or walking because of vertigo, you are at risk of falling. To reduce the risk of falls, make your home as safe as possible. Get rid of loose electrical cords, clutter, and slippery rugs. Also, make sure that you wear sturdy, non-slip shoes, and that your walkways are clear and well lit.

## 2019-10-11 NOTE — Progress Notes (Signed)
Established Patient Office Visit  Subjective:  Patient ID: Brent Young, male    DOB: [birth year]  Age: 59 y.o. MRN: [id number]  CC:  Chief Complaint  Patient presents with  . Diabetes    Pt. is here for diabetes follow up.     HPI [name] presents for management of Type 2 Diabetes he admits to polyphagia. Elevated Bp -Denies shortness of breath, headaches, chest pain or lower extremity edema. He is concern with vertigo balance being off when he bends over -  History reviewed. No pertinent past medical history.  History reviewed. No pertinent surgical history.  History reviewed. No pertinent family history.  Social History   Socioeconomic History  . Marital status: Married    Spouse name: Not on file  . Number of children: Not on file  . Years of education: Not on file  . Highest education level: Not on file  Occupational History  . Not on file  Tobacco Use  . Smoking status: Never Smoker  . Smokeless tobacco: Never Used  Substance and Sexual Activity  . Alcohol use: Yes    Comment: occasionally  . Drug use: No  . Sexual activity: Not on file  Other Topics Concern  . Not on file  Social History Narrative  . Not on file   Social Determinants of Health   Financial Resource Strain:   . Difficulty of Paying Living Expenses:   Food Insecurity:   . Worried About Programme researcher, broadcasting/film/video in the Last Year:   . Barista in the Last Year:   Transportation Needs:   . Freight forwarder (Medical):   Marland Kitchen Lack of Transportation (Non-Medical):   Physical Activity:   . Days of Exercise per Week:   . Minutes of Exercise per Session:   Stress:   . Feeling of Stress :   Social Connections:   . Frequency of Communication with Friends and Family:   . Frequency of Social Gatherings with Friends and Family:   . Attends Religious Services:   . Active Member of Clubs or Organizations:   . Attends Banker Meetings:   Marland Kitchen Marital Status:    Intimate Partner Violence:   . Fear of Current or Ex-Partner:   . Emotionally Abused:   Marland Kitchen Physically Abused:   . Sexually Abused:     Outpatient Medications Prior to Visit  Medication Sig Dispense Refill  . Ascorbic Acid (VITAMIN C) 100 MG tablet Take 100 mg by mouth daily.    Marland Kitchen amLODipine (NORVASC) 10 MG tablet Take 10 mg by mouth daily.    Marland Kitchen gabapentin (NEURONTIN) 300 MG capsule Take 300 mg by mouth 3 (three) times daily.    Marland Kitchen glimepiride (AMARYL) 2 MG tablet Take 4 mg by mouth every morning.    . hydrochlorothiazide (HYDRODIURIL) 25 MG tablet Take 25 mg by mouth daily.    Marland Kitchen losartan (COZAAR) 25 MG tablet Take 25 mg by mouth daily.    . metFORMIN (GLUCOPHAGE) 1000 MG tablet Take 1,000 mg by mouth 2 (two) times daily.     No facility-administered medications prior to visit.    No Known Allergies  ROS Review of Systems  Gastrointestinal: Positive for abdominal distention and constipation.  Endocrine: Positive for polyphagia.  Neurological: Positive for dizziness and syncope.  All other systems reviewed and are negative.     Objective:     Vitals:   10/11/19 0956  BP: (!) 146/83  Pulse: 97  Temp: 98 F (36.7 C)  TempSrc: Oral  SpO2: 97%  Weight: 278 lb (126.1 kg)  Height: 6' 0.5" (1.842 m)   Physical Exam General: Vital signs reviewed.  Patient is well-developed obese male and well-nourished, in no acute distress and cooperative with exam.  Head: Normocephalic and atraumatic. Eyes: EOMI, conjunctivae normal, no scleral icterus.  Neck: Supple, trachea midline, normal ROM, no JVD, masses, thyromegaly, or carotid bruit present.  Cardiovascular: RRR, S1 normal, S2 normal, no murmurs, gallops, or rubs. Pulmonary/Chest: Clear to auscultation bilaterally, no wheezes, rales, or rhonchi. Abdominal: Soft, non-tender, distended, BS +, no masses, organomegaly, or guarding present.  Musculoskeletal: No joint deformities, erythema, or stiffness, ROM full and  nontender. Extremities: No lower extremity edema bilaterally,  pulses symmetric and intact bilaterally. No cyanosis or clubbing. Neurological: A&O x3, Strength is normal and symmetric bilaterally, cranial nerve II-XII are grossly intact, no focal motor deficit, sensory intact to light touch bilaterally.  Skin: Warm, dry and intact. No rashes or erythema. Psychiatric: Normal mood and affect. speech and behavior is normal. Cognition and memory are normal.  BP (!) 146/83 (BP Location: Left Arm, Patient Position: Sitting, Cuff Size: Normal)   Pulse 97   Temp 98 F (36.7 C) (Oral)   Ht 6' 0.5" (1.842 m)   Wt 278 lb (126.1 kg)   SpO2 97%   BMI 37.19 kg/m  Wt Readings from Last 3 Encounters:  10/11/19 278 lb (126.1 kg)     Health Maintenance Due  Topic Date Due  . Hepatitis C Screening  Never done  . COVID-19 Vaccine (1) Never done  . HIV Screening  Never done  . COLONOSCOPY  Never done    There are no preventive care reminders to display for this patient.  No results found for: TSH Lab Results  Component Value Date   WBC 12.0 (H) 03/17/2017   HGB 14.5 03/17/2017   HCT 43.2 03/17/2017   MCV 92.9 03/17/2017   PLT 259 03/17/2017   Lab Results  Component Value Date   NA 136 03/17/2017   K 3.3 (L) 03/17/2017   CO2 29 03/17/2017   GLUCOSE 272 (H) 03/17/2017   BUN 5 (L) 03/17/2017   CREATININE 0.75 03/17/2017   BILITOT 1.1 03/17/2017   ALKPHOS 86 03/17/2017   AST 37 03/17/2017   ALT 45 03/17/2017   PROT 7.5 03/17/2017   ALBUMIN 3.8 03/17/2017   CALCIUM 8.8 (L) 03/17/2017   ANIONGAP 11 03/17/2017   No results found for: CHOL No results found for: HDL No results found for: LDLCALC No results found for: TRIG No results found for: [facility] Lab Results  Component Value Date   HGBA1C 10.1 (A) 10/11/2019      Assessment & Plan:  [name] was seen today for diabetes.  Diagnoses and all orders for this visit:  Type 2 diabetes mellitus without complication, without  long-term current use of insulin (HCC) Goal of therapy: Less than 6.5 hemoglobin A1c.  Reference clinical  Decrease foods that are high in carbohydrates are the following rice, potatoes, breads, sugars, and pastas.  Reduction in the intake (eating) will assist in lowering your blood sugars. -     Glucose (CBG) -     HgB A1c 10 uncontrolled out of medication for appromately 3 months . He is adamant about not taking insulin we discussed complication. He is actively excerning and monitor his intake. Increased Amaryl 4mg  daily. Discontinue metformin change to janumet 50/1000 twice daily   Need for hepatitis C  screening test Health maintenance care gap  -     Hepatitis C Antibody  Colon cancer screening Normal colon cancer screening.  CDC recommends colorectal screening from ages 50-75.   Encounter for screening for HIV Health maintenance care gap   Other orders -     sitaGLIPtin-metformin (JANUMET) 50-1000 MG tablet; Take 1 tablet by mouth 2 (two) times daily with a meal. -     amLODipine (NORVASC) 10 MG tablet; Take 1 tablet (10 mg total) by mouth daily. -     hydrochlorothiazide (HYDRODIURIL) 25 MG tablet; Take 1 tablet (25 mg total) by mouth daily. -     losartan (COZAAR) 25 MG tablet; Take 1 tablet (25 mg total) by mouth daily. -     Oxcarbazepine (TRILEPTAL) 300 MG tablet; Take 1 tablet (300 mg total) by mouth 3 (three) times daily. -     glimepiride (AMARYL) 4 MG tablet; Take 1 tablet (4 mg total) by mouth every morning.    Meds ordered this encounter  Medications  . DISCONTD: metFORMIN (GLUCOPHAGE) 1000 MG tablet    Sig: Take 1 tablet (1,000 mg total) by mouth 2 (two) times daily.    Dispense:  180 tablet    Refill:  1  . DISCONTD: amLODipine (NORVASC) 10 MG tablet    Sig: Take 1 tablet (10 mg total) by mouth daily.    Dispense:  90 tablet    Refill:  1  . DISCONTD: hydrochlorothiazide (HYDRODIURIL) 25 MG tablet    Sig: Take 1 tablet (25 mg total) by mouth daily.     Dispense:  90 tablet    Refill:  1  . DISCONTD: losartan (COZAAR) 25 MG tablet    Sig: Take 1 tablet (25 mg total) by mouth daily.    Dispense:  90 tablet    Refill:  1  . DISCONTD: Oxcarbazepine (TRILEPTAL) 300 MG tablet    Sig: Take 1 tablet (300 mg total) by mouth 3 (three) times daily.    Dispense:  270 tablet    Refill:  1  . sitaGLIPtin-metformin (JANUMET) 50-1000 MG tablet    Sig: Take 1 tablet by mouth 2 (two) times daily with a meal.    Dispense:  120 tablet    Refill:  1  . amLODipine (NORVASC) 10 MG tablet    Sig: Take 1 tablet (10 mg total) by mouth daily.    Dispense:  90 tablet    Refill:  1  . hydrochlorothiazide (HYDRODIURIL) 25 MG tablet    Sig: Take 1 tablet (25 mg total) by mouth daily.    Dispense:  90 tablet    Refill:  1  . losartan (COZAAR) 25 MG tablet    Sig: Take 1 tablet (25 mg total) by mouth daily.    Dispense:  90 tablet    Refill:  1  . Oxcarbazepine (TRILEPTAL) 300 MG tablet    Sig: Take 1 tablet (300 mg total) by mouth 3 (three) times daily.    Dispense:  270 tablet    Refill:  1  . glimepiride (AMARYL) 4 MG tablet    Sig: Take 1 tablet (4 mg total) by mouth every morning.    Dispense:  90 tablet    Refill:  1    Follow-up: Return in about 3 months (around 01/11/2020) for in person. dm.    Grayce Sessions, NP

## 2019-10-12 ENCOUNTER — Telehealth: Payer: Self-pay | Admitting: Gastroenterology

## 2019-10-12 LAB — LIPID PANEL
Chol/HDL Ratio: 3.3 ratio (ref 0.0–5.0)
Cholesterol, Total: 132 mg/dL (ref 100–199)
HDL: 40 mg/dL (ref >39)
LDL Chol Calc (NIH): 79 mg/dL (ref 0–99)
Triglycerides: 64 mg/dL (ref 0–149)
VLDL Cholesterol Cal: 13 mg/dL (ref 5–40)

## 2019-10-12 LAB — CBC WITH DIFFERENTIAL/PLATELET
Basophils Absolute: 0.1 10*3/uL (ref 0.0–0.2)
Basos: 1 %
EOS (ABSOLUTE): 0.1 10*3/uL (ref 0.0–0.4)
Eos: 1 %
Hematocrit: 45.7 % (ref 37.5–51.0)
Hemoglobin: 15.1 g/dL (ref 13.0–17.7)
Immature Grans (Abs): 0 10*3/uL (ref 0.0–0.1)
Immature Granulocytes: 0 %
Lymphocytes Absolute: 3 10*3/uL (ref 0.7–3.1)
Lymphs: 39 %
MCH: 31.4 pg (ref 26.6–33.0)
MCHC: 33 g/dL (ref 31.5–35.7)
MCV: 95 fL (ref 79–97)
Monocytes Absolute: 1 10*3/uL — ABNORMAL HIGH (ref 0.1–0.9)
Monocytes: 13 %
Neutrophils Absolute: 3.5 10*3/uL (ref 1.4–7.0)
Neutrophils: 46 %
Platelets: 269 10*3/uL (ref 150–450)
RBC: 4.81 x10E6/uL (ref 4.14–5.80)
RDW: 13.1 % (ref 11.6–15.4)
WBC: 7.7 10*3/uL (ref 3.4–10.8)

## 2019-10-12 LAB — CMP14+EGFR
ALT: 19 IU/L (ref 0–44)
AST: 22 IU/L (ref 0–40)
Albumin/Globulin Ratio: 1.5 (ref 1.2–2.2)
Albumin: 4.7 g/dL (ref 3.8–4.9)
Alkaline Phosphatase: 160 IU/L — ABNORMAL HIGH (ref 48–121)
BUN/Creatinine Ratio: 17 (ref 10–24)
BUN: 14 mg/dL (ref 8–27)
Bilirubin Total: 0.8 mg/dL (ref 0.0–1.2)
CO2: 33 mmol/L — ABNORMAL HIGH (ref 20–29)
Calcium: 9.7 mg/dL (ref 8.6–10.2)
Chloride: 94 mmol/L — ABNORMAL LOW (ref 96–106)
Creatinine, Ser: 0.84 mg/dL (ref 0.76–1.27)
GFR calc Af Amer: 110 mL/min/{1.73_m2} (ref >59)
GFR calc non Af Amer: 95 mL/min/{1.73_m2} (ref >59)
Globulin, Total: 3.1 g/dL (ref 1.5–4.5)
Glucose: 171 mg/dL — ABNORMAL HIGH (ref 65–99)
Potassium: 4 mmol/L (ref 3.5–5.2)
Sodium: 143 mmol/L (ref 134–144)
Total Protein: 7.8 g/dL (ref 6.0–8.5)

## 2019-10-12 LAB — MICROALBUMIN / CREATININE URINE RATIO
Creatinine, Urine: 129.9 mg/dL
Microalb/Creat Ratio: 48 mg/g creat — ABNORMAL HIGH (ref 0–29)
Microalbumin, Urine: 61.8 ug/mL

## 2019-10-12 LAB — HIV ANTIBODY (ROUTINE TESTING W REFLEX): HIV Screen 4th Generation wRfx: NONREACTIVE

## 2019-10-12 LAB — HEPATITIS C ANTIBODY: Hep C Virus Ab: <0.1 s/co ratio (ref 0.0–0.9)

## 2019-10-12 LAB — PSA: Prostate Specific Ag, Serum: 0.5 ng/mL (ref 0.0–4.0)

## 2019-10-12 NOTE — Telephone Encounter (Signed)
I reviewed the records in Epic. I do not see any records from gastroenterologist. If this is for a screening colonoscopy, he may schedule the procedure. Thanks.

## 2019-10-12 NOTE — Telephone Encounter (Signed)
Hey Dr Orvan Falconer, this pt is being referred by [facility] for a colon cancer screening. This pt was seen at [facility] on 01/2019. Records are in epic for you to review. Please advise on scheduling

## 2019-10-13 ENCOUNTER — Encounter: Payer: Self-pay | Admitting: Gastroenterology

## 2019-10-19 ENCOUNTER — Telehealth (INDEPENDENT_AMBULATORY_CARE_PROVIDER_SITE_OTHER): Payer: Self-pay

## 2019-10-19 NOTE — Telephone Encounter (Signed)
-----   Message from Grayce Sessions, NP sent at 10/18/2019  1:54 PM EDT ----- Hepatitis results were negative, hemoglobin and hematocrit are normal.  Cholesterol panel was normal.  Elevation in LIVER enzyme alkaline phosphate but other enzymes are normal AST/ALT

## 2019-10-19 NOTE — Telephone Encounter (Signed)
Contacted patient. He verified date of birth. He is aware of all lab results per PCP. He verbalized understanding. Maryjean Morn, CMA

## 2019-11-08 ENCOUNTER — Telehealth: Payer: Self-pay

## 2019-11-08 NOTE — Telephone Encounter (Signed)
Patient no-showed today's appointment; appointment was for 11/08/2019 at 1:00 pm;  Attempted to leave a message for patient to call back to reschedule appt for pre visit on three separate occassions; no show letter will be sent to patient to call office and reschedule pre visit and procedure; pre visit appt and procedure has been cancelled;

## 2019-12-01 ENCOUNTER — Ambulatory Visit: Payer: 59

## 2019-12-01 ENCOUNTER — Other Ambulatory Visit: Payer: Self-pay

## 2019-12-01 VITALS — Ht 72.0 in | Wt 288.8 lb

## 2019-12-01 DIAGNOSIS — Z1211 Encounter for screening for malignant neoplasm of colon: Secondary | ICD-10-CM

## 2019-12-01 MED ORDER — SUTAB 1479-225-188 MG PO TABS: 1.0000 | ORAL_TABLET | ORAL | 0 refills | Status: DC

## 2019-12-01 NOTE — Progress Notes (Signed)
Denies allergies to eggs or soy products. Denies complication of anesthesia or sedation. Denies use of weight loss medication. Denies use of O2.   Emmi instructions given for colonoscopy.  Patient states that he completed his covid vaccination in July 2021. Patient took the J&J through [facility].

## 2019-12-13 ENCOUNTER — Encounter: Payer: 59 | Admitting: Gastroenterology

## 2020-01-09 ENCOUNTER — Other Ambulatory Visit: Payer: Self-pay

## 2020-01-09 ENCOUNTER — Encounter: Payer: Self-pay | Admitting: Gastroenterology

## 2020-01-09 ENCOUNTER — Ambulatory Visit: Payer: 59 | Admitting: Gastroenterology

## 2020-01-09 VITALS — BP 195/119 | HR 100 | Temp 96.2°F | Ht 73.0 in | Wt 288.0 lb

## 2020-01-09 MED ORDER — SODIUM CHLORIDE 0.9 % IV SOLN: 500.0000 mL | Freq: Once | INTRAVENOUS | Status: DC

## 2020-01-09 NOTE — Progress Notes (Signed)
Patient came in this morning for a colonoscopy and it was noted that his oxygen saturation was in the upper 70's to upper 80's. MD was made aware and talked to the patient. After further review the prep was not sufficient as well. MD wants patient to see his PCP and his O2 and then we will reschedule his colonoscopy.   Patient understands that he needs to go to his PCP and evaluate his O2. He agreed and said he would call today.

## 2020-01-09 NOTE — Progress Notes (Signed)
Vital signs checked by:SP  The patient states no changes in medical or surgical history since pre-visit screening on 12/01/2019.

## 2020-01-10 ENCOUNTER — Ambulatory Visit
Admission: EM | Admit: 2020-01-10 | Discharge: 2020-01-10 | Disposition: A | Discharge status: Other Acute Inpatient Hospital | Payer: 59

## 2020-01-10 NOTE — ED Triage Notes (Signed)
Pt states he fell on an escalator on Sunday evening and has left shoulder pain from the fall. Pt complains of no other injuries. Pt is aox4 and ambulatory.

## 2020-01-11 ENCOUNTER — Ambulatory Visit (INDEPENDENT_AMBULATORY_CARE_PROVIDER_SITE_OTHER): Payer: 59 | Admitting: Primary Care

## 2020-01-11 DIAGNOSIS — S42254A Nondisplaced fracture of greater tuberosity of right humerus, initial encounter for closed fracture: Secondary | ICD-10-CM | POA: Insufficient documentation

## 2020-01-11 DIAGNOSIS — J9601 Acute respiratory failure with hypoxia: Secondary | ICD-10-CM | POA: Insufficient documentation

## 2020-01-17 ENCOUNTER — Ambulatory Visit (INDEPENDENT_AMBULATORY_CARE_PROVIDER_SITE_OTHER): Payer: Self-pay | Admitting: *Deleted

## 2020-01-17 NOTE — Telephone Encounter (Signed)
Per initial encounter:  Pt is calling and requesting an appt with michelle today. Pt was d/c from hospital 2 days ago. Pt has duplicate medictions. Pt has stop taking all medication due to fogginess. Please advice also crm has been sent to office     Contacted pt regarding his concerns; he is not sure what medications to take; reviewed medications with pt per d/c instructions:   Patient Instructions:  Current Discharge Medication List   START taking these medications  Details  furosemide (LASIX) 40 MG tablet Take 1 tablet (40 mg total) by mouth at bedtime. Qty: 30 tablet, Refills: 2   oxyCODONE (ROXICODONE) 5 MG immediate release tablet Take 1 tablet (5 mg total) by mouth every 8 (eight) hours as needed for up to 5 days for Moderate pain (4-6). Qty: 15 tablet, Refills: 0   potassium chloride ER (KLOR-CON-M, K-DUR) 10 MEQ extended release tablet Take 1 tablet (10 mEq total) by mouth daily. Qty: 30 tablet, Refills: 2    CONTINUE these medications which have CHANGED  Details  aspirin 81 MG EC tablet *ANTIPLATELET* Take 1 tablet (81 mg total) by mouth daily. Qty: 90 tablet, Refills: 0   losartan (COZAAR) 50 MG tablet Take 0.5 tablets (25 mg total) by mouth daily. Qty: 15 tablet, Refills: 2  Associated Diagnoses: Essential hypertension    CONTINUE these medications which have NOT CHANGED  Details  amLODIPine (NORVASC) 10 MG tablet TAKE 1 TABLET BY MOUTH EVERY DAY Qty: 30 tablet, Refills: 11  Associated Diagnoses: Essential hypertension   gabapentin (NEURONTIN) 300 MG capsule TAKE 1 CAPSULE BY MOUTH THREE TIMES A DAY Qty: 90 capsule, Refills: 3  Associated Diagnoses: Type 2 diabetes mellitus with diabetic autonomic neuropathy, without long-term current use of insulin (HCC)   glimepiride (AMARYL) 4 MG tablet Take 4 mg by mouth daily before breakfast.   hydroCHLOROthiazide (HYDRODIURIL) 25 MG tablet TAKE 1 TABLET BY MOUTH EVERY DAY Qty: 30 tablet, Refills: 11  Associated  Diagnoses: Essential hypertension   metFORMIN (GLUCOPHAGE) 1000 MG tablet TAKE 1 TABLET (1,000 MG TOTAL) BY MOUTH 2 TIMES DAILY WITH MEALS. Qty: 60 tablet, Refills: 2  Associated Diagnoses: Type 2 diabetes mellitus without complication, unspecified whether long term insulin use (HCC)   OXcarbazepine (TRILEPTAL) 300 MG tablet Take 300 mg by mouth 3 times daily.    STOP taking these medications   SITagliptin-metFORMIN (JANUMET) 50-1,000 mg per tablet   The pt repeated the medications for accuracy; pt also notified of appt with Gwinda Passe, [facility], on 01/18/20 at 1430 (confirmed by Mallory); he verbalized understanding, and will bring his medications with him for review; will route to office for notification of encounter  Reason for Disposition . Caller has medicine question only, adult not sick, AND triager answers question  Answer Assessment - Initial Assessment Questions 1. NAME of MEDICATION: "What medicine are you calling about?"     All discharge medication 2. QUESTION: "What is your question?" (e.g., medication refill, side effect)  how should I take them? 3. PRESCRIBING HCP: "Who prescribed it?" Reason: if prescribed by specialist, call should be referred to that group.     Recent hospital discharge 4. SYMPTOMS: "Do you have any symptoms?"      5. SEVERITY: If symptoms are present, ask "Are they mild, moderate or severe?"      6. PREGNANCY:  "Is there any chance that you are pregnant?" "When was your last menstrual period?"    n/a  Protocols used: MEDICATION QUESTION CALL-A-AH

## 2020-01-17 NOTE — Telephone Encounter (Signed)
Patient has appt scheduled for 10/28.

## 2020-01-18 ENCOUNTER — Ambulatory Visit (INDEPENDENT_AMBULATORY_CARE_PROVIDER_SITE_OTHER): Payer: 59 | Admitting: Primary Care

## 2020-01-18 ENCOUNTER — Encounter (INDEPENDENT_AMBULATORY_CARE_PROVIDER_SITE_OTHER): Payer: Self-pay | Admitting: Primary Care

## 2020-01-18 ENCOUNTER — Other Ambulatory Visit: Payer: Self-pay

## 2020-01-18 VITALS — BP 121/81 | HR 99 | Temp 97.5°F

## 2020-01-18 DIAGNOSIS — E0821 Diabetes mellitus due to underlying condition with diabetic nephropathy: Secondary | ICD-10-CM

## 2020-01-18 DIAGNOSIS — E119 Type 2 diabetes mellitus without complications: Secondary | ICD-10-CM | POA: Diagnosis not present

## 2020-01-18 DIAGNOSIS — R739 Hyperglycemia, unspecified: Secondary | ICD-10-CM

## 2020-01-18 DIAGNOSIS — Z760 Encounter for issue of repeat prescription: Secondary | ICD-10-CM

## 2020-01-18 DIAGNOSIS — W19XXXD Unspecified fall, subsequent encounter: Secondary | ICD-10-CM | POA: Diagnosis not present

## 2020-01-18 DIAGNOSIS — I272 Pulmonary hypertension, unspecified: Secondary | ICD-10-CM

## 2020-01-18 LAB — POCT GLYCOSYLATED HEMOGLOBIN (HGB A1C): Hemoglobin A1C: 10.8 % — AB (ref 4.0–5.6)

## 2020-01-18 LAB — POCT URINALYSIS DIP (CLINITEK)
Bilirubin, UA: NEGATIVE
Blood, UA: NEGATIVE
Glucose, UA: 500 mg/dL — AB
Ketones, POC UA: NEGATIVE mg/dL
Leukocytes, UA: NEGATIVE
Nitrite, UA: NEGATIVE
POC PROTEIN,UA: NEGATIVE
Spec Grav, UA: 1.015 (ref 1.010–1.025)
Urobilinogen, UA: 0.2 E.U./dL
pH, UA: 5 (ref 5.0–8.0)

## 2020-01-18 LAB — GLUCOSE, POCT (MANUAL RESULT ENTRY): POC Glucose: 327 mg/dl — AB (ref 70–99)

## 2020-01-18 MED ORDER — GLIMEPIRIDE 4 MG PO TABS: 4.0000 mg | ORAL_TABLET | Freq: Every morning | ORAL | 1 refills | Status: DC

## 2020-01-18 MED ORDER — ATORVASTATIN CALCIUM 10 MG PO TABS: 10.0000 mg | ORAL_TABLET | Freq: Every day | ORAL | 3 refills | Status: DC

## 2020-01-18 MED ORDER — JANUMET 50-1000 MG PO TABS: 1.0000 | ORAL_TABLET | Freq: Two times a day (BID) | ORAL | 1 refills | Status: DC

## 2020-01-18 NOTE — Progress Notes (Signed)
Established Patient Office Visit  Subjective:  Patient ID: Brent Young, male    DOB: Apr 26, 1960  Age: 59 y.o. MRN: [id number]  CC:  Chief Complaint  Patient presents with  . Diabetes    HPI [name] is a 59 year old male who presents for the management of type 2 diabetes.  He has stopped taking all diabetic medication due to duplicates and unclear of what he needed to be on.  A1c today has increased to 10.8.  He endorses increase in polyuria, polyphagia and polydipsia.  Blood pressure is unremarkable 121/81. Denies shortness of breath, headaches, chest pain or lower extremity edema  Past Medical History:  Diagnosis Date  . Allergy   . Arthritis   . Diabetes mellitus without complication (HCC)   . Hypertension     Past Surgical History:  Procedure Laterality Date  . FRACTURE SURGERY Left    Patient fractured left arm    Family History  Problem Relation Age of Onset  . Colon cancer Maternal Grandmother   . Esophageal cancer Neg Hx   . Stomach cancer Neg Hx   . Rectal cancer Neg Hx     Social History   Socioeconomic History  . Marital status: Married    Spouse name: Not on file  . Number of children: Not on file  . Years of education: Not on file  . Highest education level: Not on file  Occupational History  . Not on file  Tobacco Use  . Smoking status: Never Smoker  . Smokeless tobacco: Never Used  Vaping Use  . Vaping Use: Never used  Substance and Sexual Activity  . Alcohol use: Yes    Comment: occasionally  . Drug use: No  . Sexual activity: Not on file  Other Topics Concern  . Not on file  Social History Narrative  . Not on file   Social Determinants of Health   Financial Resource Strain:   . Difficulty of Paying Living Expenses: Not on file  Food Insecurity:   . Worried About Programme researcher, broadcasting/film/video in the Last Year: Not on file  . Ran Out of Food in the Last Year: Not on file  Transportation Needs:   . Lack of Transportation  (Medical): Not on file  . Lack of Transportation (Non-Medical): Not on file  Physical Activity:   . Days of Exercise per Week: Not on file  . Minutes of Exercise per Session: Not on file  Stress:   . Feeling of Stress : Not on file  Social Connections:   . Frequency of Communication with Friends and Family: Not on file  . Frequency of Social Gatherings with Friends and Family: Not on file  . Attends Religious Services: Not on file  . Active Member of Clubs or Organizations: Not on file  . Attends Banker Meetings: Not on file  . Marital Status: Not on file  Intimate Partner Violence:   . Fear of Current or Ex-Partner: Not on file  . Emotionally Abused: Not on file  . Physically Abused: Not on file  . Sexually Abused: Not on file    Outpatient Medications Prior to Visit  Medication Sig Dispense Refill  . amLODipine (NORVASC) 10 MG tablet Take 1 tablet (10 mg total) by mouth daily. 90 tablet 1  . aspirin EC 81 MG tablet Take 81 mg by mouth daily. Swallow whole.    . furosemide (LASIX) 40 MG tablet Take 40 mg by mouth at bedtime.    Marland Kitchen  hydrochlorothiazide (HYDRODIURIL) 25 MG tablet Take 1 tablet (25 mg total) by mouth daily. 90 tablet 1  . KLOR-CON M10 10 MEQ tablet Take 10 mEq by mouth daily.    Marland Kitchen losartan (COZAAR) 25 MG tablet Take 1 tablet (25 mg total) by mouth daily. 90 tablet 1  . magnesium oxide (MAG-OX) 400 MG tablet Take 400 mg by mouth daily.    . Oxcarbazepine (TRILEPTAL) 300 MG tablet Take 1 tablet (300 mg total) by mouth 3 (three) times daily. 270 tablet 1  . oxyCODONE (OXY IR/ROXICODONE) 5 MG immediate release tablet Take 5 mg by mouth every 8 (eight) hours as needed.    . gabapentin (NEURONTIN) 300 MG capsule Take 300 mg by mouth 3 (three) times daily.    Marland Kitchen glimepiride (AMARYL) 4 MG tablet Take 1 tablet (4 mg total) by mouth every morning. 90 tablet 1  . sitaGLIPtin-metformin (JANUMET) 50-1000 MG tablet Take 1 tablet by mouth 2 (two) times daily with a meal.  120 tablet 1  . Ascorbic Acid (VITAMIN C) 100 MG tablet Take 100 mg by mouth daily.    Marland Kitchen aspirin 81 MG EC tablet Take 81 mg by mouth daily.     Facility-Administered Medications Prior to Visit  Medication Dose Route Frequency Provider Last Rate Last Admin  . 0.9 %  sodium chloride infusion  500 mL Intravenous Once Tressia Danas, MD        No Known Allergies  ROS Review of Systems  Respiratory: Positive for shortness of breath.        With exertion   Endocrine: Positive for polydipsia, polyphagia and polyuria.  Musculoskeletal:       Shoulder pain      Objective:    Physical Exam Vitals reviewed.  HENT:     Head: Normocephalic.     Nose: Nose normal.  Eyes:     Extraocular Movements: Extraocular movements intact.  Cardiovascular:     Rate and Rhythm: Normal rate and regular rhythm.  Pulmonary:     Effort: Pulmonary effort is normal.     Breath sounds: Normal breath sounds.  Abdominal:     General: Bowel sounds are normal. There is distension.     Palpations: Abdomen is soft.  Musculoskeletal:     Cervical back: Normal range of motion and neck supple.     Comments: Right arm fx in a sling  Good capillary reflex   Skin:    General: Skin is warm and dry.  Neurological:     Mental Status: He is alert and oriented to person, place, and time.  Psychiatric:        Mood and Affect: Mood normal.        Behavior: Behavior normal.        Thought Content: Thought content normal.        Judgment: Judgment normal.     BP 121/81 (BP Location: Left Arm, Patient Position: Sitting, Cuff Size: Normal)   Pulse 99   Temp (!) 97.5 F (36.4 C) (Temporal)   SpO2 95%  Wt Readings from Last 3 Encounters:  01/09/20 288 lb (130.6 kg)  12/01/19 288 lb 12.8 oz (131 kg)  10/11/19 278 lb (126.1 kg)     Health Maintenance Due  Topic Date Due  . COVID-19 Vaccine (1) Never done  . COLONOSCOPY  Never done    There are no preventive care reminders to display for this  patient.  No results found for: TSH Lab Results  Component Value Date  WBC 7.7 10/11/2019   HGB 15.1 10/11/2019   HCT 45.7 10/11/2019   MCV 95 10/11/2019   PLT 269 10/11/2019   Lab Results  Component Value Date   NA 143 10/11/2019   K 4.0 10/11/2019   CO2 33 (H) 10/11/2019   GLUCOSE 171 (H) 10/11/2019   BUN 14 10/11/2019   CREATININE 0.84 10/11/2019   BILITOT 0.8 10/11/2019   ALKPHOS 160 (H) 10/11/2019   AST 22 10/11/2019   ALT 19 10/11/2019   PROT 7.8 10/11/2019   ALBUMIN 4.7 10/11/2019   CALCIUM 9.7 10/11/2019   ANIONGAP 11 03/17/2017   Lab Results  Component Value Date   CHOL 132 10/11/2019   Lab Results  Component Value Date   HDL 40 10/11/2019   Lab Results  Component Value Date   LDLCALC 79 10/11/2019   Lab Results  Component Value Date   TRIG 64 10/11/2019   Lab Results  Component Value Date   CHOLHDL 3.3 10/11/2019   Lab Results  Component Value Date   HGBA1C 10.8 (A) 01/18/2020      Assessment & Plan:  Kennith was seen today for diabetes.  Diagnoses and all orders for this visit:  Type 2 diabetes mellitus without complication, without long-term current use of insulin (HCC) -     HgB A1c -     Glucose (CBG) -     glimepiride (AMARYL) 4 MG tablet; Take 1 tablet (4 mg total) by mouth every morning. -     sitaGLIPtin-metformin (JANUMET) 50-1000 MG tablet; Take 1 tablet by mouth 2 (two) times daily with a meal.  Hyperglycemia -     POCT URINALYSIS DIP (CLINITEK) -     glimepiride (AMARYL) 4 MG tablet; Take 1 tablet (4 mg total) by mouth every morning. -     sitaGLIPtin-metformin (JANUMET) 50-1000 MG tablet; Take 1 tablet by mouth 2 (two) times daily with a meal.  Fall, subsequent encounter acute hypoxic respiratory failure after falling and hurting his right shoulder. X-ray indicated  nondisplaced fracture involving the greater tuberosity of the humerus   Moderate to severe pulmonary hypertension (HCC) Acute hypoxic respiratory failure  likely secondary to volume overload #Right Ventricular Volume Overload #Moderate Pulmonary Hypertension  refer to cardiology  Medication refills Meds ordered this encounter  Medications  . glimepiride (AMARYL) 4 MG tablet    Sig: Take 1 tablet (4 mg total) by mouth every morning.    Dispense:  90 tablet    Refill:  1  . sitaGLIPtin-metformin (JANUMET) 50-1000 MG tablet    Sig: Take 1 tablet by mouth 2 (two) times daily with a meal.    Dispense:  120 tablet    Refill:  1   Reviewed hospital encounter from [facility] labs, imaging Follow-up: Return in about 3 months (around 04/19/2020) for DM/fasting labs.    Grayce Sessions, NP

## 2020-01-18 NOTE — Patient Instructions (Signed)

## 2020-01-31 ENCOUNTER — Other Ambulatory Visit (INDEPENDENT_AMBULATORY_CARE_PROVIDER_SITE_OTHER): Payer: Self-pay | Admitting: Primary Care

## 2020-01-31 NOTE — Telephone Encounter (Signed)
Medication Refill - Medication: Pt is calling requesting Oxycodone HCL for his shoulder fracture pain   oxyCODONE (OXY IR/ROXICODONE) 5 MG immediate release tablet  Pt is completely out, still in severe pain    Has the patient contacted their pharmacy? Yes.   (Agent: If no, request that the patient contact the pharmacy for the refill.) (Agent: If yes, when and what did the pharmacy advise?)  Preferred Pharmacy (with phone number or street name):  [facility] [phone] G[place] Otto, [place] - [address]  [address] [place] [address]  Phone: [phone] Fax: [phone]     Agent: Please be advised that RX refills may take up to 3 business days. We ask that you follow-up with your pharmacy.

## 2020-01-31 NOTE — Telephone Encounter (Signed)
Requested medication (s) are due for refill today: Yes  Requested medication (s) are on the active medication list: No  Last refill:  12/2019  Future visit scheduled: Yes  Notes to clinic:  Unable to refill, cannot delegate     Requested Prescriptions  Pending Prescriptions Disp Refills   oxyCODONE (OXY IR/ROXICODONE) 5 MG immediate release tablet 30 tablet     Sig: Take 1 tablet (5 mg total) by mouth every 8 (eight) hours as needed for up to 5 days.      Not Delegated - Analgesics:  Opioid Agonists Failed - 01/31/2020  4:25 PM      Failed - This refill cannot be delegated      Failed - Urine Drug Screen completed in last 360 days      Passed - Valid encounter within last 6 months    Recent Outpatient Visits           1 week ago Type 2 diabetes mellitus without complication, without long-term current use of insulin (HCC)   [facility] Gwinda Passe P, NP   3 months ago Type 2 diabetes mellitus without complication, without long-term current use of insulin (HCC)   [facility] Grayce Sessions, NP   4 months ago Encounter to establish care   [facility] Grayce Sessions, NP       Future Appointments             In 2 months Randa Evens, Kinnie Scales, NP [facility]

## 2020-02-01 ENCOUNTER — Telehealth: Payer: Self-pay | Admitting: Primary Care

## 2020-02-01 NOTE — Telephone Encounter (Signed)
Copied from CRM [id number]. Topic: General - Inquiry >> Feb 01, 2020 11:49 AM Daphine Deutscher D wrote: Reason for CRM: Pt called saying he needing clarification on his medications saying he is not sure what he is supposed to be taking and that some medications have been added  CB#  [phone]

## 2020-02-02 ENCOUNTER — Ambulatory Visit (INDEPENDENT_AMBULATORY_CARE_PROVIDER_SITE_OTHER): Payer: 59 | Admitting: Primary Care

## 2020-02-02 ENCOUNTER — Encounter (INDEPENDENT_AMBULATORY_CARE_PROVIDER_SITE_OTHER): Payer: Self-pay

## 2020-02-02 DIAGNOSIS — Z09 Encounter for follow-up examination after completed treatment for conditions other than malignant neoplasm: Secondary | ICD-10-CM | POA: Diagnosis not present

## 2020-02-02 DIAGNOSIS — Z760 Encounter for issue of repeat prescription: Secondary | ICD-10-CM

## 2020-02-02 DIAGNOSIS — E119 Type 2 diabetes mellitus without complications: Secondary | ICD-10-CM

## 2020-02-02 DIAGNOSIS — Z1211 Encounter for screening for malignant neoplasm of colon: Secondary | ICD-10-CM | POA: Diagnosis not present

## 2020-02-02 DIAGNOSIS — K7030 Alcoholic cirrhosis of liver without ascites: Secondary | ICD-10-CM

## 2020-02-04 MED ORDER — LOSARTAN POTASSIUM 25 MG PO TABS: 25.0000 mg | ORAL_TABLET | Freq: Every day | ORAL | 1 refills | Status: DC

## 2020-02-04 MED ORDER — HYDROCHLOROTHIAZIDE 25 MG PO TABS: 25.0000 mg | ORAL_TABLET | Freq: Every day | ORAL | 1 refills | Status: DC

## 2020-02-04 MED ORDER — AMLODIPINE BESYLATE 10 MG PO TABS: 10.0000 mg | ORAL_TABLET | Freq: Every day | ORAL | 1 refills | Status: DC

## 2020-02-04 MED ORDER — OXCARBAZEPINE 300 MG PO TABS: 300.0000 mg | ORAL_TABLET | Freq: Three times a day (TID) | ORAL | 1 refills | Status: DC

## 2020-02-04 NOTE — Progress Notes (Signed)
Established Patient Office Visit  Subjective:  Patient ID: Brent Young, male    DOB: 07-Aug-1960  Age: 59 y.o. MRN: [id number]  CC: No chief complaint on file.   HPI MR.Brent Young is a 59 year old male who presents for medication refills.  Will review medication with patient's 90 days were given with additional refills.  Medication will be renewed again at this visit.  He voices no complaints or concerns.  Since last office visit with PCP hospitalized for shortness of breath, shoulder pain and speech problem diagnosed with atypical pneumonia with acute respiratory failure with hypoxia and generalized weakness and shoulder injury subsequent a fall with Closed nondisplaced fracture of greater tuberosity of right humerus, He did present with arm in a sling with good capillary refills Past Medical History:  Diagnosis Date  . Allergy   . Arthritis   . Diabetes mellitus without complication (HCC)   . Hypertension     Past Surgical History:  Procedure Laterality Date  . FRACTURE SURGERY Left    Patient fractured left arm    Family History  Problem Relation Age of Onset  . Colon cancer Maternal Grandmother   . Esophageal cancer Neg Hx   . Stomach cancer Neg Hx   . Rectal cancer Neg Hx     Social History   Socioeconomic History  . Marital status: Married    Spouse name: Not on file  . Number of children: Not on file  . Years of education: Not on file  . Highest education level: Not on file  Occupational History  . Not on file  Tobacco Use  . Smoking status: Never Smoker  . Smokeless tobacco: Never Used  Vaping Use  . Vaping Use: Never used  Substance and Sexual Activity  . Alcohol use: Yes    Comment: occasionally  . Drug use: No  . Sexual activity: Not on file  Other Topics Concern  . Not on file  Social History Narrative  . Not on file   Social Determinants of Health   Financial Resource Strain:   . Difficulty of Paying Living Expenses: Not on file   Food Insecurity:   . Worried About Programme researcher, broadcasting/film/video in the Last Year: Not on file  . Ran Out of Food in the Last Year: Not on file  Transportation Needs:   . Lack of Transportation (Medical): Not on file  . Lack of Transportation (Non-Medical): Not on file  Physical Activity:   . Days of Exercise per Week: Not on file  . Minutes of Exercise per Session: Not on file  Stress:   . Feeling of Stress : Not on file  Social Connections:   . Frequency of Communication with Friends and Family: Not on file  . Frequency of Social Gatherings with Friends and Family: Not on file  . Attends Religious Services: Not on file  . Active Member of Clubs or Organizations: Not on file  . Attends Banker Meetings: Not on file  . Marital Status: Not on file  Intimate Partner Violence:   . Fear of Current or Ex-Partner: Not on file  . Emotionally Abused: Not on file  . Physically Abused: Not on file  . Sexually Abused: Not on file    Outpatient Medications Prior to Visit  Medication Sig Dispense Refill  . aspirin EC 81 MG tablet Take 81 mg by mouth daily. Swallow whole.    Marland Kitchen atorvastatin (LIPITOR) 10 MG tablet Take 1 tablet (10 mg  total) by mouth daily. 90 tablet 3  . furosemide (LASIX) 40 MG tablet Take 40 mg by mouth at bedtime.    Mar[place] Kitchen glimepiride (AMARYL) 4 MG tablet Take 1 tablet (4 mg total) by mouth every morning. 90 tablet 1  . KLOR-CON M10 10 MEQ tablet Take 10 mEq by mouth daily.    . magnesium oxide (MAG-OX) 400 MG tablet Take 400 mg by mouth daily.    . sitaGLIPtin-metformin (JANUMET) 50-1000 MG tablet Take 1 tablet by mouth 2 (two) times daily with a meal. 120 tablet 1  . amLODipine (NORVASC) 10 MG tablet Take 1 tablet (10 mg total) by mouth daily. 90 tablet 1  . hydrochlorothiazide (HYDRODIURIL) 25 MG tablet Take 1 tablet (25 mg total) by mouth daily. 90 tablet 1  . losartan (COZAAR) 25 MG tablet Take 1 tablet (25 mg total) by mouth daily. 90 tablet 1  . Oxcarbazepine  (TRILEPTAL) 300 MG tablet Take 1 tablet (300 mg total) by mouth 3 (three) times daily. 270 tablet 1   Facility-Administered Medications Prior to Visit  Medication Dose Route Frequency Provider Last Rate Last Admin  . 0.9 %  sodium chloride infusion  500 mL Intravenous Once Tressia Danas, MD        No Known Allergies  ROS Review of Systems  Musculoskeletal:       Shoulder pain status post fall  Neurological:       Numbness and tingling diabetic neuropathy  All other systems reviewed and are negative.     Objective:    Physical Exam Vitals reviewed.  Constitutional:      Appearance: Normal appearance.  HENT:     Head: Normocephalic.     Nose: Nose normal.  Cardiovascular:     Rate and Rhythm: Normal rate and regular rhythm.  Pulmonary:     Effort: Pulmonary effort is normal.     Breath sounds: Normal breath sounds.  Abdominal:     General: Bowel sounds are normal. There is distension.     Palpations: Abdomen is soft.  Musculoskeletal:        General: Normal range of motion.     Cervical back: Normal range of motion.     Comments: Decreased range of motion on right side upper extremity  Skin:    General: Skin is warm and dry.  Neurological:     Mental Status: He is alert and oriented to person, place, and time.  Psychiatric:        Mood and Affect: Mood normal.        Behavior: Behavior normal.        Thought Content: Thought content normal.        Judgment: Judgment normal.     There were no vitals taken for this visit. Wt Readings from Last 3 Encounters:  01/09/20 288 lb (130.6 kg)  12/01/19 288 lb 12.8 oz (131 kg)  10/11/19 278 lb (126.1 kg)     Health Maintenance Due  Topic Date Due  . COVID-19 Vaccine (1) Never done  . COLONOSCOPY  Never done    There are no preventive care reminders to display for this patient.  No results found for: TSH Lab Results  Component Value Date   WBC 7.7 10/11/2019   HGB 15.1 10/11/2019   HCT 45.7 10/11/2019    MCV 95 10/11/2019   PLT 269 10/11/2019   Lab Results  Component Value Date   NA 143 10/11/2019   K 4.0 10/11/2019   CO2 33 (  H) 10/11/2019   GLUCOSE 171 (H) 10/11/2019   BUN 14 10/11/2019   CREATININE 0.84 10/11/2019   BILITOT 0.8 10/11/2019   ALKPHOS 160 (H) 10/11/2019   AST 22 10/11/2019   ALT 19 10/11/2019   PROT 7.8 10/11/2019   ALBUMIN 4.7 10/11/2019   CALCIUM 9.7 10/11/2019   ANIONGAP 11 03/17/2017   Lab Results  Component Value Date   CHOL 132 10/11/2019   Lab Results  Component Value Date   HDL 40 10/11/2019   Lab Results  Component Value Date   LDLCALC 79 10/11/2019   Lab Results  Component Value Date   TRIG 64 10/11/2019   Lab Results  Component Value Date   CHOLHDL 3.3 10/11/2019   Lab Results  Component Value Date   HGBA1C 10.8 (A) 01/18/2020      Assessment & Plan:  Diagnoses and all orders for this visit:  Medication refill -     Oxcarbazepine (TRILEPTAL) 300 MG tablet; Take 1 tablet (300 mg total) by mouth 3 (three) times daily. -     hydrochlorothiazide (HYDRODIURIL) 25 MG tablet; Take 1 tablet (25 mg total) by mouth daily. -     losartan (COZAAR) 25 MG tablet; Take 1 tablet (25 mg total) by mouth daily. -     amLODipine (NORVASC) 10 MG tablet; Take 1 tablet (10 mg total) by mouth daily.  Type 2 diabetes mellitus without complication, without long-term current use of insulin (HCC) Uncontrolled diabetes willful reluctance he to insulin during hospitalization he was placed on 20 units of Lantus daily and to continue glimepiride 4 mg and Metformin Metformin had been discontinued and replaced with Janumet 50/1000 twice daily.  Hospital recommended GLP-1 versus an SGLT2    Colon cancer screening -     Ambulatory referral to Gastroenterology  Hospital discharge follow-up Presented to the emergency room at [facility] with acute hypoxic respiratory failure after falling and hurting his right shoulder he was trying to get on  a escalator. Recommended follow-ups orthopedist in 2 weeks and a referral will place for gastrology.  Alcoholic cirrhosis, unspecified whether ascites present (HCC) Incidental finding of liver nodularity on CT scan of the chest was further assessed with ultrasound for ascites.  Elevated liver enzymes but hepatic panel was negative  -     Ambulatory referral to Gastroenterology    Meds ordered this encounter  Medications  . Oxcarbazepine (TRILEPTAL) 300 MG tablet    Sig: Take 1 tablet (300 mg total) by mouth 3 (three) times daily.    Dispense:  270 tablet    Refill:  1  . hydrochlorothiazide (HYDRODIURIL) 25 MG tablet    Sig: Take 1 tablet (25 mg total) by mouth daily.    Dispense:  90 tablet    Refill:  1  . losartan (COZAAR) 25 MG tablet    Sig: Take 1 tablet (25 mg total) by mouth daily.    Dispense:  90 tablet    Refill:  1  . amLODipine (NORVASC) 10 MG tablet    Sig: Take 1 tablet (10 mg total) by mouth daily.    Dispense:  90 tablet    Refill:  1    Follow-up: Return in about 3 months (around 05/04/2020) for HTN/DM/.    Gray[place] Sessions, NP

## 2020-02-28 DIAGNOSIS — D649 Anemia, unspecified: Secondary | ICD-10-CM | POA: Insufficient documentation

## 2020-02-28 DIAGNOSIS — R29810 Facial weakness: Secondary | ICD-10-CM | POA: Insufficient documentation

## 2020-02-29 DIAGNOSIS — I160 Hypertensive urgency: Secondary | ICD-10-CM | POA: Insufficient documentation

## 2020-03-26 ENCOUNTER — Encounter: Payer: Self-pay | Admitting: Occupational Therapy

## 2020-03-26 ENCOUNTER — Ambulatory Visit: Payer: 59 | Attending: Physical Medicine and Rehabilitation

## 2020-03-26 ENCOUNTER — Other Ambulatory Visit: Payer: Self-pay

## 2020-03-26 ENCOUNTER — Ambulatory Visit: Payer: 59 | Admitting: Occupational Therapy

## 2020-03-26 DIAGNOSIS — R41844 Frontal lobe and executive function deficit: Secondary | ICD-10-CM | POA: Diagnosis present

## 2020-03-26 DIAGNOSIS — R2681 Unsteadiness on feet: Secondary | ICD-10-CM | POA: Diagnosis present

## 2020-03-26 DIAGNOSIS — R41840 Attention and concentration deficit: Secondary | ICD-10-CM

## 2020-03-26 DIAGNOSIS — R278 Other lack of coordination: Secondary | ICD-10-CM | POA: Insufficient documentation

## 2020-03-26 DIAGNOSIS — M25511 Pain in right shoulder: Secondary | ICD-10-CM | POA: Diagnosis present

## 2020-03-26 DIAGNOSIS — R262 Difficulty in walking, not elsewhere classified: Secondary | ICD-10-CM | POA: Insufficient documentation

## 2020-03-26 DIAGNOSIS — M6281 Muscle weakness (generalized): Secondary | ICD-10-CM

## 2020-03-26 DIAGNOSIS — I69851 Hemiplegia and hemiparesis following other cerebrovascular disease affecting right dominant side: Secondary | ICD-10-CM | POA: Insufficient documentation

## 2020-03-26 DIAGNOSIS — M25611 Stiffness of right shoulder, not elsewhere classified: Secondary | ICD-10-CM | POA: Diagnosis present

## 2020-03-26 DIAGNOSIS — R2689 Other abnormalities of gait and mobility: Secondary | ICD-10-CM | POA: Diagnosis present

## 2020-03-26 NOTE — Therapy (Signed)
[facility] [address] [place], [place], [address] Phone: [phone]   Fax:  [phone]  Physical Therapy Evaluation  Patient Details  Name: Brent Young MRN: [id number] Date of Birth: [birth year] Referring Provider (PT): Alison Murray, MD.   Encounter Date: 03/26/2020   PT End of Session - 03/26/20 0927    Visit Number 1    Number of Visits 13    Date for PT Re-Evaluation 06/24/20   POC for 8 weeks, Cert for 90 days   Authorization Type [employer]    PT Start Time [phone]    PT Stop Time 1013    PT Time Calculation (min) 45 min    Equipment Utilized During Treatment Gait belt    Activity Tolerance Patient tolerated treatment well    Behavior During Therapy [facility] for tasks assessed/performed           Past Medical History:  Diagnosis Date  . Allergy   . Arthritis   . Diabetes mellitus without complication (HCC)   . Hypertension     Past Surgical History:  Procedure Laterality Date  . FRACTURE SURGERY Left    Patient fractured left arm    There were no vitals filed for this visit.    Subjective Assessment - 03/26/20 0929    Subjective Patient had hospitlization in October 2021 with acute hypoxic respirtatory failure along with R humerus fracture. Patient reports that he went back to ED in December on for right facial weakess and slurred speech. Patient recieved inpatient rehab services and was discharged home. Patient has a sling for the R Humerus Fracture, and does still have pain in the shoulder. Patient reports that he is having difficulty with the stairs, and is worried about falling. Patient reports no falls since being home. Reports that he feels balance is off. Patient not ambulating with cane into session, but reports using a cane at home.    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 Yes    Pain Score 7     Pain Location Shoulder    Pain Orientation Right;Anterior    Pain Descriptors / Indicators Aching    Pain Type Chronic pain    Pain Onset More than a month ago    Pain Frequency Intermittent    Aggravating Factors  movement    Pain Relieving Factors rest              [facility] PT Assessment - 03/26/20 0001      Assessment   Medical Diagnosis CVA - Left Pons    Referring Provider (PT) Alison Murray, MD.    Onset Date/Surgical Date 03/12/20    Hand Dominance Right    Prior Therapy Inpatient Rehab      Precautions   Precautions Fall      Restrictions   Weight Bearing Restrictions Yes    RUE Weight Bearing Non weight bearing    Other Position/Activity Restrictions has not been cleared according to OT      Balance Screen   Has the patient fallen in the past 6 months Yes    How many times? 3-4    Has the patient had a decrease in activity level because of a fear of falling?  Yes  Is the patient reluctant to leave their home because of a fear of falling?  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    Leisure Golf      Cognition   Overall Cognitive Status Within Functional Limits for tasks assessed      Observation/Other Assessments   Focus on  Therapeutic Outcomes (FOTO)  58%      Sensation   Light Touch Impaired by gross assessment    Additional Comments Impaired sensation at bottom of feet due to diabetic neuropathy      Coordination   Gross Motor Movements are Fluid and Coordinated No      ROM / Strength   AROM / PROM / Strength AROM;Strength      AROM   Overall AROM  Within functional limits for tasks performed      Strength   Overall Strength Deficits    Strength Assessment Site Hip;Knee;Ankle    Right/Left Hip Right;Left    Right Hip Flexion 4+/5    Right Hip ABduction 4/5    Right Hip ADduction 4+/5    Left Hip Flexion 4-/5    Left Hip ABduction 4+/5    Left Hip ADduction 4/5    Right/Left Knee Right;Left    Right Knee Flexion 4+/5    Right Knee Extension 4+/5    Left Knee Flexion 4+/5    Left Knee Extension 4+/5    Right/Left Ankle Right;Left    Right Ankle Dorsiflexion 4-/5    Left Ankle Dorsiflexion 4-/5      Bed Mobility   Bed Mobility --   reports challenging, but no assistance required     Transfers   Transfers Sit to Stand;Stand to Sit    Sit to Stand 5: Supervision    Five time sit to stand comments  31.07 secs with UE support from standard chair.    Stand to Sit 5: Supervision      Ambulation/Gait   Ambulation/Gait Yes    Ambulation/Gait Assistance 5: Supervision    Ambulation/Gait Assistance Details completed ambulation without AD, mild fatigue after ambulation    Ambulation Distance (Feet) 115 Feet    Assistive device None    Gait Pattern Step-through pattern    Ambulation Surface Level;Indoor    Gait velocity 12 secs = 2.73 ft/sec    Stairs Yes    Stairs Assistance 4: Min guard;4: Min assist    Stairs Assistance Details (indicate cue type and reason) completed ascend/descend with LUE to potential NWB on RUE. patient able to ascend with reciprocal pattern, but complete step to pattern with descent.    Stair Management Technique One rail Left;Alternating pattern;Step to  pattern;Forwards    Number of Stairs 4    Height of Stairs 6  Standardized Balance Assessment   Standardized Balance Assessment Timed Up and Go Test;Berg Balance Test      Berg Balance Test   Sit to Stand Able to stand  independently using hands    Standing Unsupported Able to stand safely 2 minutes    Sitting with Back Unsupported but Feet Supported on Floor or Stool Able to sit safely and securely 2 minutes    Stand to Sit Controls descent by using hands    Transfers Able to transfer safely, definite need of hands    Standing Unsupported with Eyes Closed Able to stand 10 seconds with supervision    Standing Unsupported with Feet Together Able to place feet together independently and stand for 1 minute with supervision    From Standing, Reach Forward with Outstretched Arm Can reach forward >12 cm safely (5")    From Standing Position, Pick up Object from Floor Able to pick up shoe, needs supervision    From Standing Position, Turn to Look Behind Over each Shoulder Turn sideways only but maintains balance    Turn 360 Degrees Able to turn 360 degrees safely but slowly    Standing Unsupported, Alternately Place Feet on Step/Stool Able to complete 4 steps without aid or supervision    Standing Unsupported, One Foot in Front Able to take small step independently and hold 30 seconds    Standing on One Leg Tries to lift leg/unable to hold 3 seconds but remains standing independently    Total Score 38    Berg comment: 38/56      Timed Up and Go Test   TUG Normal TUG    Normal TUG (seconds) 14.81                      Objective measurements completed on examination: See above findings.               PT Education - 03/26/20 1021    Education Details Educated on [employer]) Educated Patient    Methods Explanation    Comprehension Verbalized understanding            PT Short Term Goals - 03/26/20 1210      PT SHORT TERM GOAL #1    Title Patient will be independent with initial balance/strengthening HEP (all STGs Due: 04/23/20)    Baseline no HEP established    Time 4    Period Weeks    Status New    Target Date 04/23/20      PT SHORT TERM GOAL #2   Title Patient will improve 5x sit <> stand to </= 25 seconds to demonstrate improved mobility and balance.    Baseline 31.07 secs w/ UE    Time 4    Period Weeks    Status New      PT SHORT TERM GOAL #3   Title Patient will improve Berg Balance to >/= 42/56 to demonstrate improved balance and reduced risk for falls    Baseline 38/56    Time 4    Period Weeks    Status New      PT SHORT TERM GOAL #4   Title Patient will demonstrate ability to ascend/descend 12 stairs with SPC vs. rail with supervision for improved household mobility    Baseline L Rail CGA x 4 stairs    Time 4    Period Weeks    Status New  PT Long Term Goals - 03/26/20 1213      PT LONG TERM GOAL #1   Title Patient will be independent with Final HEP for strengthening/balance (All LTGs Due: 05/21/20)    Baseline no HEP established    Time 8    Period Weeks    Status New    Target Date 05/21/20      PT LONG TERM GOAL #2   Title Patient will improve Berg Balance to >/= 45/56 to demonstrate improved balance and reduced risk for falls    Baseline 38/56    Time 8    Period Weeks    Status New      PT LONG TERM GOAL #3   Title Patient will improve TUG to </= 12 seconds with LRAD to demonstrate reduced fall risk    Baseline 14.81 secs    Time 8    Period Weeks    Status New      PT LONG TERM GOAL #4   Title Patient will improve 5x sit <> stand to </= 20 seconds to demonstrate improved mobility and reduced fall risk    Baseline 31.07 secs    Time 8    Period Weeks    Status New      PT LONG TERM GOAL #5   Title Patient will improve gait speed to >/= 3.0 ft/sec to demonstrate improved community mobility    Baseline 2.78 ft/sec    Time 8    Period Weeks    Status New       Additional Long Term Goals   Additional Long Term Goals Yes      PT LONG TERM GOAL #6   Title Patient will improve FOTO to >/= 72% upon discharge    Baseline 58%    Time 8    Period Weeks    Status New                  Plan - 03/26/20 1205    Clinical Impression Statement Patient is a 60 y.o. male that was referred to Neuro OPPT services for CVA. 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 Patient ambulating into session without AD, but reports using SPC for functional mobility. Upon evaluation patient scored 38/56 on Berg Balance, 14.81 secs on TUG, and 31.07 secs on 5x sit <> stand, all outcome measures showing patient is at an increased risk for falls. Patient presents with the following impairments: abnormal gait, decreased balance, impaired sensation, decreased strength, decreased coordination, impaired UE functional use, and increased risk for falls. patient will benefit from skilled PT services to address impairments, maximize functional mobility, and reduce fall risk.    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, obesity    Examination-Activity Limitations Bathing;Dressing;Locomotion Level;Stand;Stairs    Examination-Participation Restrictions Occupation;Driving;Community Activity    Stability/Clinical Decision Making Evolving/Moderate complexity    Clinical Decision Making Moderate    Rehab Potential Good    PT Frequency 1x / week    PT Duration 4 weeks   initially due to transportation, followed by 2x/week for 4 weeks   PT Treatment/Interventions ADLs/Self Care Home Management;Cryotherapy;Electrical Stimulation;Moist Heat;DME  Instruction;Gait training;Stair training;Functional mobility training;Therapeutic activities;Therapeutic exercise;Balance training;Neuromuscular  re-education;Patient/family education;Orthotic Fit/Training;Manual techniques;Vestibular;Passive range of motion    PT Next Visit Plan Initiate HEP focused on strength/balance. Train stairs with SPC due to NWB as of right now on RUE    Recommended Other Services Occupational Therapy, Speech Therapy    Consulted and Agree with Plan of Care Patient           Patient will benefit from skilled therapeutic intervention in order to improve the following deficits and impairments:  Abnormal gait,Decreased balance,Decreased endurance,Difficulty walking,Impaired sensation,Pain,Impaired UE functional use,Decreased strength,Decreased safety awareness,Decreased knowledge of use of DME,Decreased coordination,Decreased activity tolerance,Dizziness  Visit Diagnosis: Muscle weakness (generalized)  Unsteadiness on feet  Difficulty in walking, not elsewhere classified  Other abnormalities of gait and mobility     Problem List There are no problems to display for this patient.   Jones Bales, PT, DPT 03/26/2020, 12:17 PM  [place] [address] [place], [place], [address] Phone: [phone]   Fax:  [phone]  Name: Brent Young MRN: [id number] Date of Birth: Nov 18, 1960

## 2020-03-26 NOTE — Therapy (Signed)
[facility] [address] [place], [place], [address] Phone: [phone]   Fax:  [phone]  Occupational Therapy Evaluation  Patient Details  Name: Brent Young MRN: [id number] Date of Birth: Jan 26, 1961 No data recorded  Encounter Date: 03/26/2020   OT End of Session - 03/26/20 1050    Visit Number 1    Number of Visits 13    Date for OT Re-Evaluation 05/21/20    Authorization Type [employer]    OT Start Time 6962    OT Stop Time 1100    OT Time Calculation (min) 45 min    Activity Tolerance Patient tolerated treatment well    Behavior During Therapy [facility] for tasks assessed/performed           Past Medical History:  Diagnosis Date  . Allergy   . Arthritis   . Diabetes mellitus without complication ([facility])   . Hypertension     Past Surgical History:  Procedure Laterality Date  . FRACTURE SURGERY Left    Patient fractured left arm    There were no vitals filed for this visit.   Subjective Assessment - 03/26/20 1046    Subjective  Pt reports difficulty with his RUE s/p fall in Oct 2021 resulting in fx humerus. Pt also reports difficulty with word finding s/p CVA in Dec 2021. 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    Pertinent History PMH: CVA, HTN, RUE fx humerus (12/2019), Covid Dec 2020, Peripheral Neuropathy, CHF, cirrhosis of liver    Limitations No driving. 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 Yes    Pain Score 7     Pain Location Shoulder    Pain Orientation Right;Anterior    Pain Descriptors / Indicators Aching    Pain Type Chronic pain    Pain Onset More than a month ago    Pain Frequency Constant    Aggravating Factors  movement    Pain Relieving Factors rest             [facility] OT Assessment - 03/26/20 1022      Assessment   Medical Diagnosis CVA  - left pons    Onset Date/Surgical Date 03/12/20    Hand Dominance Right    Prior Therapy inpatient rehab      Precautions   Precautions Fall      Restrictions   Weight Bearing Restrictions Yes    RUE Weight Bearing Non weight bearing   has not been cleared since initial onset 01/10/20     Balance Screen   Has the patient fallen in the past 6 months Yes    How many times? [place] expects to be discharged to: Private residence    Living Arrangements Parent   mother, son, sister and niece and grand nieces/nephews   Available Help at Discharge Family   son is primary assistance   Type of Home House    Home Access Stairs   1   Home Layout Two level   bed and bath upstairs   Bathroom Shower/Tub Tub/Shower unit    Home Equipment Toilet riser;Cane - single point;Shower seat   toilet riser not installed yet. 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    Leisure [place], gardening      ADL   Eating/Feeding Needs assist with cutting food    Grooming Modified independent   increased time and has to take breaks   Upper Body Bathing Modified independent    Lower Body Bathing Modified independent    Upper Body Dressing Increased time    Lower Body Dressing Minimal assistance   son assists with socks and shoes   Toilet Transfer Supervision/safety    Toileting - Clothing Manipulation Increased time    Toileting -  Hygiene Increase time;Minimal assistance   not receiving assistance but reports difficulty   Tub/Shower Transfer Min guard      IADL   Prior Level of Function Shopping independent    Shopping Needs to be accompanied on any shopping trip   son is currently doing the grocery shopping   Prior Level of Function Light Housekeeping independent    Light Housekeeping Needs help with all home maintenance  tasks   does some laundry but has trouble taking it back upstairs   Prior Level of Function Meal Prep independently    Meal Prep Prepares adequate meal if supplied with ingredients   some help with RUE   Prior Level of Function Community Mobility driving independenly prior    [employer] Relies on family or friends for transportation    Prior Level of Function Medication Managment independent    Medication Management Is responsible for taking medication in correct dosages at correct time   uses pill box and fills up weekly   Prior Level of Function Financial Management independent    Financial Management Requires supervision/minimal cuing   son is taking care of finances at this time     Mobility   Mobility Status Comments ambulation but high risk of falls      Vision - History   Baseline Vision Wears glasses all the time   glasses currently broken. wearing Rx sunglasses at times   Additional Comments pt does not report any vision changes.      Cognition   Overall Cognitive Status No family/caregiver present to determine baseline cognitive functioning    Memory Impaired   unable to recall dates, precautions and overall condition     Observation/Other Assessments   Focus on Therapeutic Outcomes (FOTO)  58%      Sensation   Light Touch Appears Intact   BUE. Reports some numbness on left pinky and bilateral feet d/t neuropathy   Hot/Cold Appears Intact      Coordination   9 Hole Peg Test Right;Left    Right 9 Hole Peg Test 35.34    Left 9 Hole Peg Test 32.25      ROM / Strength   AROM / PROM / Strength AROM;Strength      AROM   Overall AROM  Deficits    Overall AROM Comments RUE Deficits; LUE WFL    AROM Assessment Site Shoulder    Right/Left Shoulder Right;Left    Right Shoulder Flexion 110 Degrees    Right Shoulder ABduction 100 Degrees    Left Shoulder Flexion 150 Degrees    Left Shoulder ABduction 130 Degrees      Strength   Overall Strength Deficits    Overall  Strength Comments RUE unable to test d/t unclear precautions; LUE WFL      Hand Function  Right Hand Grip (lbs) unabe to test    Left Hand Grip (lbs) 54.6                             OT Short Term Goals - 03/26/20 1453      OT SHORT TERM GOAL #1   Title Pt will be independent with HEP 04/23/20    Time 4    Period Weeks    Status New    Target Date 04/23/20      OT SHORT TERM GOAL #2   Title Pt will perform LB dressing (with socks and shoe) with supervision    Baseline min A    Time 4    Period Weeks    Status New      OT SHORT TERM GOAL #3   Title Pt will verbalize understanding of memory compensatory strategies    Time 4    Period Weeks    Status New      OT SHORT TERM GOAL #4   Title Pt will demonstrate improved shoulder flexion of at least 120 degrees    Baseline 110 RUE    Time 4    Period Weeks    Status New      OT SHORT TERM GOAL #5   Title Pt will perform environmental scanning in a moderately distracting environment with 75% accuracy.    Time 4    Period Weeks    Status New             OT Long Term Goals - 03/26/20 1652      OT LONG TERM GOAL #1   Title Pt will be independent with updated HEP 05/21/2020    Time 8    Period Weeks    Status New    Target Date 05/21/20      OT LONG TERM GOAL #2   Title Pt will perform physical and cognitive task simultaneously with 95% accuracy    Time 8    Period Weeks    Status New      OT LONG TERM GOAL #3   Title Pt will complete mod complex cooking with supervision and good safety awareness    Time 8    Period Weeks    Status New      OT LONG TERM GOAL #4   Title Pt will demonstrate improved shoulder flexion of at least 130 degrees to obtain object from high level shelf with pain equal to or less than 5/10    Baseline RUE 110    Time 8    Period Weeks    Status New      OT LONG TERM GOAL #5   Title Pt will improve RUE range of motion and report greater ease and comfort for toilet  hygiene.    Time 8    Period Weeks    Status New      Long Term Additional Goals   Additional Long Term Goals Yes      OT LONG TERM GOAL #6   Title Pt will complete all basic ADLs with mod I    Time 8    Period Weeks    Status New                 Plan - 03/26/20 1052    Clinical Impression Statement Pt is a 60 year old male presenting to Neuro OPOT s/p CVA 02/2020 with PMH positive for  HTN, DM, peripheral neuropathy, cirrhosis of the liver, RUE humerus fracture s/p fall 12/2019 with unresolved and unclear follow up. Pt presents with limited range of motion and strength in RUE however strength was not assessed d/t unclear weight bearing precautions. Pt has decrease independence with ADLs and IADLs d/t deficit areas. Skilled OT is recommended to target listed areas of deficit and increase indepenedence and decrease caregiver burden upon discharge.    OT Occupational Profile and History Problem Focused Assessment - Including review of records relating to presenting problem    Occupational performance deficits (Please refer to evaluation for details): ADL's    Body Structure / Function / Physical Skills ADL;Balance;Decreased knowledge of precautions;Decreased knowledge of use of DME;IADL;GMC;Strength;Pain;UE functional use;ROM;Mobility;FMC    Cognitive Skills Thought;Understand;Problem Solve;Memory    Rehab Potential Good    Clinical Decision Making Limited treatment options, no task modification necessary    Comorbidities Affecting Occupational Performance: None    Modification or Assistance to Complete Evaluation  No modification of tasks or assist necessary to complete eval    OT Frequency --   1x/week for 4 weeks and 2x/week for 4 weeks   OT Duration 8 weeks   1x/week for 4 weeks then 2x/week for 4 weeks or total of 12 visits over extended period d/t scheduling difficulties   OT Treatment/Interventions Self-care/ADL training;Cryotherapy;Aquatic Therapy;Moist Heat;Electrical  Stimulation;Ultrasound;Fluidtherapy;Paraffin;Therapeutic exercise;Neuromuscular education;DME and/or AE instruction;Manual Therapy;Functional Mobility Training;Passive range of motion;Cognitive remediation/compensation;Therapeutic activities;Patient/family education;Energy conservation    Plan supine shoulder HEP, get  clarification on shoulder precautions    Consulted and Agree with Plan of Care Patient           Patient will benefit from skilled therapeutic intervention in order to improve the following deficits and impairments:   Body Structure / Function / Physical Skills: ADL,Balance,Decreased knowledge of precautions,Decreased knowledge of use of DME,IADL,GMC,Strength,Pain,UE functional use,ROM,Mobility,FMC Cognitive Skills: Thought,Understand,Problem Solve,Memory     Visit Diagnosis: Hemiplegia and hemiparesis following other cerebrovascular disease affecting right dominant side (HCC) - Plan: Ot plan of care cert/re-cert, CANCELED: Ot plan of care cert/re-cert  Muscle weakness (generalized) - Plan: Ot plan of care cert/re-cert, CANCELED: Ot plan of care cert/re-cert  Unsteadiness on feet - Plan: Ot plan of care cert/re-cert, CANCELED: Ot plan of care cert/re-cert  Other abnormalities of gait and mobility - Plan: Ot plan of care cert/re-cert, CANCELED: Ot plan of care cert/re-cert  Other lack of coordination - Plan: Ot plan of care cert/re-cert, CANCELED: Ot plan of care cert/re-cert  Attention and concentration deficit - Plan: Ot plan of care cert/re-cert, CANCELED: Ot plan of care cert/re-cert  Frontal lobe and executive function deficit - Plan: Ot plan of care cert/re-cert, CANCELED: Ot plan of care cert/re-cert  Stiffness of right shoulder, not elsewhere classified - Plan: Ot plan of care cert/re-cert, CANCELED: Ot plan of care cert/re-cert  Acute pain of right shoulder - Plan: Ot plan of care cert/re-cert, CANCELED: Ot plan of care cert/re-cert    Problem List There are  no problems to display for this patient.   Osborne Casco Ronalee Scheunemann MOT, OTR/L  03/26/2020, 5:09 PM  [facility] [address] [place], [place], [address] Phone: [phone]   Fax:  [phone]  Name: Brent Young MRN: [id number] Date of Birth: March 10, 1961

## 2020-04-04 ENCOUNTER — Ambulatory Visit: Payer: 59 | Admitting: Occupational Therapy

## 2020-04-04 ENCOUNTER — Ambulatory Visit: Payer: 59 | Admitting: Physical Therapy

## 2020-04-09 ENCOUNTER — Ambulatory Visit: Payer: 59

## 2020-04-09 ENCOUNTER — Ambulatory Visit: Payer: 59 | Admitting: Occupational Therapy

## 2020-04-16 ENCOUNTER — Ambulatory Visit (INDEPENDENT_AMBULATORY_CARE_PROVIDER_SITE_OTHER): Payer: 59 | Admitting: Primary Care

## 2020-04-16 ENCOUNTER — Other Ambulatory Visit: Payer: Self-pay

## 2020-04-16 ENCOUNTER — Encounter (INDEPENDENT_AMBULATORY_CARE_PROVIDER_SITE_OTHER): Payer: Self-pay | Admitting: Primary Care

## 2020-04-16 VITALS — BP 180/119 | HR 105 | Temp 97.3°F | Ht 73.0 in | Wt 265.8 lb

## 2020-04-16 DIAGNOSIS — I10 Essential (primary) hypertension: Secondary | ICD-10-CM | POA: Diagnosis not present

## 2020-04-16 DIAGNOSIS — E0821 Diabetes mellitus due to underlying condition with diabetic nephropathy: Secondary | ICD-10-CM

## 2020-04-16 DIAGNOSIS — E119 Type 2 diabetes mellitus without complications: Secondary | ICD-10-CM | POA: Diagnosis not present

## 2020-04-16 DIAGNOSIS — Z760 Encounter for issue of repeat prescription: Secondary | ICD-10-CM

## 2020-04-16 DIAGNOSIS — M25511 Pain in right shoulder: Secondary | ICD-10-CM

## 2020-04-16 DIAGNOSIS — G8929 Other chronic pain: Secondary | ICD-10-CM

## 2020-04-16 DIAGNOSIS — Z09 Encounter for follow-up examination after completed treatment for conditions other than malignant neoplasm: Secondary | ICD-10-CM

## 2020-04-16 LAB — POCT CBG (FASTING - GLUCOSE)-MANUAL ENTRY: Glucose Fasting, POC: 10.8 mg/dL — AB (ref 70–99)

## 2020-04-16 LAB — POCT GLYCOSYLATED HEMOGLOBIN (HGB A1C): Hemoglobin A1C: 10.8 % — AB (ref 4.0–5.6)

## 2020-04-16 MED ORDER — CLONIDINE HCL 0.1 MG PO TABS
0.2000 mg | ORAL_TABLET | Freq: Once | ORAL | Status: AC
  Administered 2020-04-16: 0.2 mg via ORAL

## 2020-04-16 MED ORDER — GABAPENTIN 100 MG PO CAPS: 100.0000 mg | ORAL_CAPSULE | Freq: Three times a day (TID) | ORAL | 3 refills | Status: DC

## 2020-04-16 MED ORDER — GLIMEPIRIDE 4 MG PO TABS: 4.0000 mg | ORAL_TABLET | Freq: Every morning | ORAL | 1 refills | Status: DC

## 2020-04-16 MED ORDER — JANUMET 50-1000 MG PO TABS: 1.0000 | ORAL_TABLET | Freq: Two times a day (BID) | ORAL | 1 refills | Status: DC

## 2020-04-16 NOTE — Progress Notes (Signed)
Subjective:  Patient ID: Brent Young, male    DOB: 06-25-1960  Age: 60 y.o. MRN: [id number]  CC: Follow-up (Diabetes /)   HPI Brent Young presents for follow-up of diabetes. Patient does not check blood sugar at home on a regular bases. Blood pressure elevated tx with .2mg  of clonidine in office. He did not take his Bp medication this morning. Denies shortness of breath, headaches, chest pain or lower extremity edema, sudden onset, vision changes, unilateral weakness, dizziness, paresthesias  He complains of right shoulder pain which happen falling on the escalator in October 2021 . Follow up from the hospital. Admit date to the hospital was 02/28/2020 , patient was discharged to rehab facility for 1 week and asked to have rehab at home. Discharge dx CVA (cerebral vascular accident) -with resultant slurred speech and right facial droop. Compliant with meds - Yes Checking CBGs? Yes  Fasting avg -  120-350 Exercising regularly? - Yes Watching carbohydrate intake? - No Neuropathy ? - yES Hypoglycemic events - No:   Pertinent ROS:  Polyuria - Yes Polydipsia - No Vision problems - Yes but he has broken his prescription glasses  Medications as noted below. Taking them regularly without complication/adverse reaction being reported today.   History Brent Young has a past medical history of Allergy, Arthritis, Diabetes mellitus without complication (HCC), and Hypertension.   He has a past surgical history that includes Fracture surgery (Left).   His family history includes Colon cancer in his maternal grandmother.He reports that he has never smoked. He has never used smokeless tobacco. He reports current alcohol use. He reports that he does not use drugs.  Current Outpatient Medications on File Prior to Visit  Medication Sig Dispense Refill  . aspirin EC 81 MG tablet Take 81 mg by mouth daily. Swallow whole.    Viviann Spare atorvastatin (LIPITOR) 10 MG tablet Take 1 tablet (10 mg total) by mouth  daily. 90 tablet 3  . clopidogrel (PLAVIX) 75 MG tablet Take 75 mg by mouth daily.    . hydrochlorothiazide (HYDRODIURIL) 25 MG tablet Take 1 tablet (25 mg total) by mouth daily. 90 tablet 1  . Oxcarbazepine (TRILEPTAL) 300 MG tablet Take 1 tablet (300 mg total) by mouth 3 (three) times daily. 270 tablet 1  . amLODipine (NORVASC) 10 MG tablet Take 1 tablet (10 mg total) by mouth daily. 90 tablet 1  . furosemide (LASIX) 40 MG tablet Take 40 mg by mouth at bedtime.    Marland Kitchen KLOR-CON M10 10 MEQ tablet Take 10 mEq by mouth daily.    Marland Kitchen losartan (COZAAR) 25 MG tablet Take 1 tablet (25 mg total) by mouth daily. 90 tablet 1  . magnesium oxide (MAG-OX) 400 MG tablet Take 400 mg by mouth daily.    . metoprolol succinate (TOPROL-XL) 100 MG 24 hr tablet Take 100 mg by mouth daily.    Marland Kitchen oxyCODONE (OXY IR/ROXICODONE) 5 MG immediate release tablet SMARTSIG:1 By Mouth 4-5 Times Daily    . potassium chloride (KLOR-CON) 10 MEQ tablet Take 10 mEq by mouth 2 (two) times daily.     Current Facility-Administered Medications on File Prior to Visit  Medication Dose Route Frequency Provider Last Rate Last Admin  . 0.9 %  sodium chloride infusion  500 mL Intravenous Once Marland Kitchen, MD        ROS Review of Systems  Genitourinary: Positive for dysuria.  Musculoskeletal:       Right shoulder pain s/p fall  All other systems reviewed and  are negative.   Objective:  BP (!) 180/119 (BP Location: Left Arm, Patient Position: Sitting, Cuff Size: Large)   Pulse (!) 105   Temp (!) 97.3 F (36.3 C) (Temporal)   Ht 6\' 1"  (1.854 m)   Wt 265 lb 12.8 oz (120.6 kg)   SpO2 93%   BMI 35.07 kg/m   BP Readings from Last 3 Encounters:  04/16/20 (!) 180/119  01/18/20 121/81  01/10/20 (!) 174/121    Wt Readings from Last 3 Encounters:  04/16/20 265 lb 12.8 oz (120.6 kg)  01/09/20 288 lb (130.6 kg)  12/01/19 288 lb 12.8 oz (131 kg)    Physical Exam Vitals reviewed.  Constitutional:      Appearance: He is obese.   HENT:     Right Ear: External ear normal.     Left Ear: External ear normal.  Eyes:     Extraocular Movements: Extraocular movements intact.  Cardiovascular:     Rate and Rhythm: Normal rate and regular rhythm.  Pulmonary:     Effort: Pulmonary effort is normal.     Breath sounds: Normal breath sounds.  Abdominal:     General: Bowel sounds are normal. There is distension.     Palpations: Abdomen is soft.  Musculoskeletal:        General: Normal range of motion.     Cervical back: Normal range of motion.  Skin:    General: Skin is warm and dry.  Neurological:     Mental Status: He is alert and oriented to person, place, and time.  Psychiatric:        Mood and Affect: Mood normal.        Behavior: Behavior normal.        Thought Content: Thought content normal.        Judgment: Judgment normal.     Lab Results  Component Value Date   HGBA1C 10.8 (A) 01/18/2020   HGBA1C 10.1 (A) 10/11/2019    Lab Results  Component Value Date   WBC 7.7 10/11/2019   HGB 15.1 10/11/2019   HCT 45.7 10/11/2019   PLT 269 10/11/2019   GLUCOSE 171 (H) 10/11/2019   CHOL 132 10/11/2019   TRIG 64 10/11/2019   HDL 40 10/11/2019   LDLCALC 79 10/11/2019   ALT 19 10/11/2019   AST 22 10/11/2019   NA 143 10/11/2019   K 4.0 10/11/2019   CL 94 (L) 10/11/2019   CREATININE 0.84 10/11/2019   BUN 14 10/11/2019   CO2 33 (H) 10/11/2019   HGBA1C 10.8 (A) 01/18/2020     Assessment & Plan:   Brent Young was seen today for follow-up.  Diagnoses and all orders for this visit: Brent Young was seen today for follow-up.  Diagnoses and all orders for this visit:  Type 2 diabetes mellitus without complication, without long-term current use of insulin (HCC) ADA recommends the following therapeutic goals for glycemic control related to A1c measurements: Goal of therapy: Less than 6.5 hemoglobin A1c.  Reference clinical practice recommendations. Foods that are high in carbohydrates are the following rice,  potatoes, breads, sugars, and pastas.  Reduction in the intake (eating) will assist in lowering your blood sugars. -     Glucose (CBG), Fasting -     HgB A1c -     glimepiride (AMARYL) 4 MG tablet; Take 1 tablet (4 mg total) by mouth every morning. -     sitaGLIPtin-metformin (JANUMET) 50-1000 MG tablet; Take 1 tablet by mouth 2 (two) times daily with a  meal.  Diabetic nephropathy associated with secondary diabetes mellitus (HCC) Prescribed gabpentin 100mg  TID for numbness and tingling bilateral feet   Medication refill -     glimepiride (AMARYL) 4 MG tablet; Take 1 tablet (4 mg total) by mouth every morning. -     sitaGLIPtin-metformin (JANUMET) 50-1000 MG tablet; Take 1 tablet by mouth 2 (two) times daily with a meal.  Essential hypertension Unclear if Bp is uncontrol current 4 Bp medications are prescribe states he is taking them as order reviewed list all were 3 months with 1 refill. Patient will return for Bp ck on medication. blood pressure goal is less than 130/80, low-sodium, DASH diet, medication compliance, 150 minutes of moderate intensity exercise per week. Discussed medication compliance, adverse effects -      Chronic right shoulder pain Decrease ROM increase pain with lifting unable to reach behind him -     Ambulatory referral to Orthopedic Surgery  Hospital discharge follow-up  Vascular to follow after d/c. Continue DAPT x 90 days till 05/29/2020. After 90 days De-escalate to aspirin 325mg  daily form 05/30/2020. Scheduled follow up with neurology. Patient is unclear were dx of seizure explained in his hospital not and he has not been taking Oxc arbazepine (TRILEPTAL) 300 MG tablet. Defer to neurology   I am having A. Groom maintain his furosemide, magnesium oxide, [place]-Con M10, aspirin EC, glimepiride, Janumet, atorvastatin, Oxcarbazepine, hydrochlorothiazide, losartan, amLODipine, clopidogrel, metoprolol succinate,  and potassium chloride. Meds ordered this  encounter  Medications  . glimepiride (AMARYL) 4 MG tablet    Sig: Take 1 tablet (4 mg total) by mouth every morning.    Dispense:  90 tablet    Refill:  1  . sitaGLIPtin-metformin (JANUMET) 50-1000 MG tablet    Sig: Take 1 tablet by mouth 2 (two) times daily with a meal.    Dispense:  120 tablet    Refill:  1  . gabapentin (NEURONTIN) 100 MG capsule    Sig: Take 1 capsule (100 mg total) by mouth 3 (three) times daily.    Dispense:  90 capsule    Refill:  3     Follow-up:   Return in about 4 weeks (around 05/14/2020) for BP recheck .  The above assessment and management plan was discussed with the patient. The patient verbalized understanding of and has agreed to the management plan. Patient is aware to call the clinic if symptoms fail to improve or worsen. Patient is aware when to return to the clinic for a follow-up visit. Patient educated on when it is appropriate to go to the emergency department.   Viviann Spare, NP-C

## 2020-04-16 NOTE — Patient Instructions (Signed)
Diabetes Mellitus Action Plan Following a diabetes action plan is a way for you to manage your diabetes (diabetes mellitus) symptoms. The plan is color-coded to help you understand what actions you need to take based on any symptoms you are having.  If you have symptoms in the red zone, you need medical care right away.  If you have symptoms in the yellow zone, you are having problems.  If you have symptoms in the green zone, you are doing well. Learning about and understanding diabetes can take time. Follow the plan that you develop with your health care provider. Know the target range for your blood sugar (glucose) level, and review your treatment plan with your health care provider at each visit. The target range for my blood sugar level is __________________________ mg/dL. Red zone Get medical help right away if you have any of the following symptoms:  A blood sugar test result that is below 54 mg/dL (3 mmol/L).  A blood sugar test result that is at or above 240 mg/dL (13.3 mmol/L) for 2 days in a row.  Confusion or trouble thinking clearly.  Difficulty breathing.  Sickness or a fever for 2 or more days that is not getting better.  Moderate or large ketone levels in your urine.  Feeling tired or having no energy. If you have any red zone symptoms, do not wait to see if the symptoms will go away. Get medical help right away. Call your local emergency services (911 in the [place] Do not drive yourself to the hospital. If you have severely low blood sugar (severe hypoglycemia) and you cannot eat or drink, you may need glucagon. Make sure a family member or close friend knows how to check your blood sugar and how to give you glucagon. You may need to be treated in a hospital for this condition.   Yellow zone If you have any of the following symptoms, your diabetes is not under control and you may need to make some changes:  A blood sugar test result that is at or above 240 mg/dL (13.3  mmol/L) for 2 days in a row.  Blood sugar test results that are below 70 mg/dL (3.9 mmol/L).  Other symptoms of hypoglycemia, such as: ? Shaking or feeling light-headed. ? Confusion or irritability. ? Feeling hungry. ? Having a fast heartbeat. If you have any yellow zone symptoms:  Treat your hypoglycemia by eating or drinking 15 grams of a rapid-acting carbohydrate. Follow the 15:15 rule: ? Take 15 grams of a rapid-acting carbohydrate, such as:  1 tube of glucose gel.  4 glucose pills.  4 oz (120 mL) of fruit juice.  4 oz (120 mL) of regular (not diet) soda. ? Check your blood sugar 15 minutes after you take the carbohydrate. ? If the repeat blood sugar test is still at or below 70 mg/dL (3.9 mmol/L), take 15 grams of a carbohydrate again. ? If your blood sugar does not increase above 70 mg/dL (3.9 mmol/L) after 3 tries, get medical help right away. ? After your blood sugar returns to normal, eat a meal or a snack within 1 hour.  Keep taking your daily medicines as told by your health care provider.  Check your blood sugar more often than you normally would. ? Write down your results. ? Call your health care provider if you have trouble keeping your blood sugar in your target range.   Green zone These signs mean you are doing well and you can continue what you   are doing to manage your diabetes:  Your blood sugar is within your personal target range. For most people, a blood sugar level before a meal (preprandial) should be 80-130 mg/dL (4.4-7.2 mmol/L).  You feel well, and you are able to do daily activities. If you are in the green zone, continue to manage your diabetes as told by your health care provider. To do this:  Eat a healthy diet.  Exercise regularly.  Check your blood sugar as told by your health care provider.  Take your medicines as told by your health care provider.   Where to find more information  American Diabetes Association (ADA):  diabetes.org  Association of Diabetes Care & Education Specialists (ADCES): diabeteseducator.org Summary  Following a diabetes action plan is a way for you to manage your diabetes symptoms. The plan is color-coded to help you understand what actions you need to take based on any symptoms you are having.  Follow the plan that you develop with your health care provider. Make sure you know your personal target blood sugar level.  Review your treatment plan with your health care provider at each visit. This information is not intended to replace advice given to you by your health care provider. Make sure you discuss any questions you have with your health care provider. Document Revised: 09/14/2019 Document Reviewed: 09/14/2019 Elsevier Patient Education  2021 Elsevier Inc.  

## 2020-04-17 LAB — CBC WITH DIFFERENTIAL/PLATELET
Basophils Absolute: 0.1 10*3/uL (ref 0.0–0.2)
Basos: 1 %
EOS (ABSOLUTE): 0.1 10*3/uL (ref 0.0–0.4)
Eos: 1 %
Hematocrit: 51.5 % — ABNORMAL HIGH (ref 37.5–51.0)
Hemoglobin: 16.9 g/dL (ref 13.0–17.7)
Immature Grans (Abs): 0 10*3/uL (ref 0.0–0.1)
Immature Granulocytes: 0 %
Lymphocytes Absolute: 2.4 10*3/uL (ref 0.7–3.1)
Lymphs: 32 %
MCH: 31 pg (ref 26.6–33.0)
MCHC: 32.8 g/dL (ref 31.5–35.7)
MCV: 94 fL (ref 79–97)
Monocytes Absolute: 0.9 10*3/uL (ref 0.1–0.9)
Monocytes: 11 %
Neutrophils Absolute: 4.1 10*3/uL (ref 1.4–7.0)
Neutrophils: 55 %
Platelets: 239 10*3/uL (ref 150–450)
RBC: 5.46 x10E6/uL (ref 4.14–5.80)
RDW: 13.6 % (ref 11.6–15.4)
WBC: 7.5 10*3/uL (ref 3.4–10.8)

## 2020-04-17 LAB — COMPREHENSIVE METABOLIC PANEL
ALT: 26 IU/L (ref 0–44)
AST: 30 IU/L (ref 0–40)
Albumin/Globulin Ratio: 1.2 (ref 1.2–2.2)
Albumin: 4.7 g/dL (ref 3.8–4.9)
Alkaline Phosphatase: 176 IU/L — ABNORMAL HIGH (ref 44–121)
BUN/Creatinine Ratio: 13 (ref 10–24)
BUN: 10 mg/dL (ref 8–27)
Bilirubin Total: 1.4 mg/dL — ABNORMAL HIGH (ref 0.0–1.2)
CO2: 23 mmol/L (ref 20–29)
Calcium: 9.7 mg/dL (ref 8.6–10.2)
Chloride: 91 mmol/L — ABNORMAL LOW (ref 96–106)
Creatinine, Ser: 0.8 mg/dL (ref 0.76–1.27)
GFR calc Af Amer: 112 mL/min/{1.73_m2} (ref >59)
GFR calc non Af Amer: 97 mL/min/{1.73_m2} (ref >59)
Globulin, Total: 3.9 g/dL (ref 1.5–4.5)
Glucose: 331 mg/dL — ABNORMAL HIGH (ref 65–99)
Potassium: 3.9 mmol/L (ref 3.5–5.2)
Sodium: 136 mmol/L (ref 134–144)
Total Protein: 8.6 g/dL — ABNORMAL HIGH (ref 6.0–8.5)

## 2020-04-17 LAB — LIPID PANEL
Chol/HDL Ratio: 4.5 ratio (ref 0.0–5.0)
Cholesterol, Total: 181 mg/dL (ref 100–199)
HDL: 40 mg/dL (ref >39)
LDL Chol Calc (NIH): 115 mg/dL — ABNORMAL HIGH (ref 0–99)
Triglycerides: 147 mg/dL (ref 0–149)
VLDL Cholesterol Cal: 26 mg/dL (ref 5–40)

## 2020-04-18 ENCOUNTER — Ambulatory Visit: Payer: 59 | Admitting: Occupational Therapy

## 2020-04-18 ENCOUNTER — Ambulatory Visit: Payer: 59 | Admitting: Physical Therapy

## 2020-04-19 ENCOUNTER — Telehealth (INDEPENDENT_AMBULATORY_CARE_PROVIDER_SITE_OTHER): Payer: Self-pay

## 2020-04-19 NOTE — Telephone Encounter (Signed)
-----   Message from Grayce Sessions, NP sent at 04/18/2020  2:00 PM EST ----- Your LDL is not in range. Your LDL is the bad cholesterol that can lead to heart attack and stroke. To lower your number you can decrease your fatty foods, red meat, cheese, milk and increase fiber like whole grains and veggies.   Patient is suppose to be taking atorvastatin 10mg  at bedtime Alkaline Phosphatase is elevated indication of liver function if drinking alcohol consider stopping or taking tylenol

## 2020-04-19 NOTE — Telephone Encounter (Signed)
Patient is aware that cholesterol is elevated. Advised him to be sure to take atorvastatin at bedtime. Increase veggies and whole grains and avoid red meat, fatty foods, cheese and milk. Stop taking tylenol due to elevated liver function. He verbalized understanding of results. Maryjean Morn, CMA

## 2020-04-22 ENCOUNTER — Ambulatory Visit: Payer: 59 | Admitting: Pharmacist

## 2020-04-23 ENCOUNTER — Ambulatory Visit: Payer: Self-pay

## 2020-04-23 ENCOUNTER — Ambulatory Visit: Payer: Self-pay | Admitting: Occupational Therapy

## 2020-04-24 ENCOUNTER — Ambulatory Visit: Payer: 59 | Admitting: Orthopedic Surgery

## 2020-04-30 ENCOUNTER — Other Ambulatory Visit: Payer: Self-pay

## 2020-04-30 ENCOUNTER — Ambulatory Visit: Payer: Self-pay

## 2020-04-30 ENCOUNTER — Ambulatory Visit: Payer: Self-pay | Attending: Physical Medicine and Rehabilitation

## 2020-04-30 ENCOUNTER — Ambulatory Visit: Payer: Self-pay | Admitting: Occupational Therapy

## 2020-04-30 ENCOUNTER — Encounter: Payer: Self-pay | Admitting: Occupational Therapy

## 2020-04-30 VITALS — BP 148/86 | HR 82

## 2020-04-30 DIAGNOSIS — R1313 Dysphagia, pharyngeal phase: Secondary | ICD-10-CM

## 2020-04-30 DIAGNOSIS — R278 Other lack of coordination: Secondary | ICD-10-CM

## 2020-04-30 DIAGNOSIS — R41844 Frontal lobe and executive function deficit: Secondary | ICD-10-CM | POA: Insufficient documentation

## 2020-04-30 DIAGNOSIS — R471 Dysarthria and anarthria: Secondary | ICD-10-CM

## 2020-04-30 DIAGNOSIS — I69851 Hemiplegia and hemiparesis following other cerebrovascular disease affecting right dominant side: Secondary | ICD-10-CM

## 2020-04-30 DIAGNOSIS — M25611 Stiffness of right shoulder, not elsewhere classified: Secondary | ICD-10-CM

## 2020-04-30 DIAGNOSIS — R2689 Other abnormalities of gait and mobility: Secondary | ICD-10-CM

## 2020-04-30 DIAGNOSIS — M6281 Muscle weakness (generalized): Secondary | ICD-10-CM

## 2020-04-30 DIAGNOSIS — R2681 Unsteadiness on feet: Secondary | ICD-10-CM | POA: Insufficient documentation

## 2020-04-30 DIAGNOSIS — R41840 Attention and concentration deficit: Secondary | ICD-10-CM

## 2020-04-30 DIAGNOSIS — R262 Difficulty in walking, not elsewhere classified: Secondary | ICD-10-CM | POA: Insufficient documentation

## 2020-04-30 DIAGNOSIS — M25511 Pain in right shoulder: Secondary | ICD-10-CM | POA: Insufficient documentation

## 2020-04-30 NOTE — Therapy (Signed)
[facility] [address] [place], [place], [address] Phone: [phone]   Fax:  [phone]  Occupational Therapy Treatment  Patient Details  Name: Brent Young MRN: [id number] Date of Birth: 1960-09-17 No data recorded  Encounter Date: 04/30/2020   OT End of Session - 04/30/20 1017    Visit Number 2    Number of Visits 13    Date for OT Re-Evaluation 05/21/20    Authorization Type [employer]    OT Start Time 1017    OT Stop Time 1100    OT Time Calculation (min) 43 min    Activity Tolerance Patient tolerated treatment well    Behavior During Therapy [facility] for tasks assessed/performed           Past Medical History:  Diagnosis Date  . Allergy   . Arthritis   . Diabetes mellitus without complication (HCC)   . Hypertension     Past Surgical History:  Procedure Laterality Date  . FRACTURE SURGERY Left    Patient fractured left arm    There were no vitals filed for this visit.   Subjective Assessment - 04/30/20 1018    Subjective  I feel a litle bit of pain when I pull up my pants and reaching behind. I couldn't get a confirmation about my appt and then I missed it.    Pertinent History PMH: CVA, HTN, RUE fx humerus (12/2019), Covid Dec 2020, Peripheral Neuropathy, CHF, cirrhosis of liver    Limitations No driving. 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 Yes    Pain Score 3     Pain Location Shoulder    Pain Orientation Right    Pain Descriptors / Indicators Aching    Pain Type Chronic pain    Pain Onset More than a month ago    Pain Frequency Constant    Aggravating Factors  movement - go to 7-8 with using. worse at night                        OT Treatments/Exercises (OP) - 04/30/20 1025      ADLs   Medication Management reviewed strategies and tips for medication  management - pt verbalized understanding      Exercises   Exercises Shoulder      Shoulder Exercises: Seated   Flexion AAROM;Both;10 reps   using unweighted cane   Other Seated Exercises closed chain with unweighted dowel, shoulder flexion, rotation, chest press, circumduction, diagonals x 10      Shoulder Exercises: ROM/Strengthening   Other ROM/Strengthening Exercises towel stretch behind back      Functional Reaching Activities   Mid Level rubber washers at varying heights for repetition with RUE shoulder for increase in shoulder range of motion and conditioning                  OT Education - 04/30/20 1022    Education Details Reviewed goals and medication management strategies    Person(s) Educated Patient    Methods Explanation    Comprehension Verbalized understanding            OT Short Term Goals - 04/30/20 1029      OT SHORT TERM GOAL #1   Title Pt will be independent with HEP 04/23/20    Time 4  Period Weeks    Status On-going    Target Date 04/23/20      OT SHORT TERM GOAL #2   Title Pt will perform LB dressing (with socks and shoe) with supervision    Baseline min A    Time 4    Period Weeks    Status On-going      OT SHORT TERM GOAL #3   Title Pt will verbalize understanding of memory compensatory strategies    Time 4    Period Weeks    Status New      OT SHORT TERM GOAL #4   Title Pt will demonstrate improved shoulder flexion of at least 120 degrees    Baseline 110 RUE    Time 4    Period Weeks    Status On-going      OT SHORT TERM GOAL #5   Title Pt will perform environmental scanning in a moderately distracting environment with 75% accuracy.    Time 4    Period Weeks    Status New             OT Long Term Goals - 03/26/20 1652      OT LONG TERM GOAL #1   Title Pt will be independent with updated HEP 05/21/2020    Time 8    Period Weeks    Status New    Target Date 05/21/20      OT LONG TERM GOAL #2   Title Pt will  perform physical and cognitive task simultaneously with 95% accuracy    Time 8    Period Weeks    Status New      OT LONG TERM GOAL #3   Title Pt will complete mod complex cooking with supervision and good safety awareness    Time 8    Period Weeks    Status New      OT LONG TERM GOAL #4   Title Pt will demonstrate improved shoulder flexion of at least 130 degrees to obtain object from high level shelf with pain equal to or less than 5/10    Baseline RUE 110    Time 8    Period Weeks    Status New      OT LONG TERM GOAL #5   Title Pt will improve RUE range of motion and report greater ease and comfort for toilet hygiene.    Time 8    Period Weeks    Status New      Long Term Additional Goals   Additional Long Term Goals Yes      OT LONG TERM GOAL #6   Title Pt will complete all basic ADLs with mod I    Time 8    Period Weeks    Status New                 Plan - 04/30/20 1052    Clinical Impression Statement Pt is progressing towards goals with increased range of motion and functional use of RUE.    OT Occupational Profile and History Problem Focused Assessment - Including review of records relating to presenting problem    Occupational performance deficits (Please refer to evaluation for details): ADL's    Body Structure / Function / Physical Skills ADL;Balance;Decreased knowledge of precautions;Decreased knowledge of use of DME;IADL;GMC;Strength;Pain;UE functional use;ROM;Mobility;FMC    Cognitive Skills Thought;Understand;Problem Solve;Memory    Rehab Potential Good    Clinical Decision Making Limited treatment options, no task modification necessary  Comorbidities Affecting Occupational Performance: None    Modification or Assistance to Complete Evaluation  No modification of tasks or assist necessary to complete eval    OT Frequency --   1x/week for 4 weeks and 2x/week for 4 weeks   OT Duration 8 weeks   1x/week for 4 weeks then 2x/week for 4 weeks or total  of 12 visits over extended period d/t scheduling difficulties   OT Treatment/Interventions Self-care/ADL training;Cryotherapy;Aquatic Therapy;Moist Heat;Electrical Stimulation;Ultrasound;Fluidtherapy;Paraffin;Therapeutic exercise;Neuromuscular education;DME and/or AE instruction;Manual Therapy;Functional Mobility Training;Passive range of motion;Cognitive remediation/compensation;Therapeutic activities;Patient/family education;Energy conservation    Plan supine shoulder HEP, get  clarification on shoulder precautions    Consulted and Agree with Plan of Care Patient           Patient will benefit from skilled therapeutic intervention in order to improve the following deficits and impairments:   Body Structure / Function / Physical Skills: ADL,Balance,Decreased knowledge of precautions,Decreased knowledge of use of DME,IADL,GMC,Strength,Pain,UE functional use,ROM,Mobility,FMC Cognitive Skills: Thought,Understand,Problem Solve,Memory     Visit Diagnosis: 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   Junious Dresser MOT, OTR/L  04/30/2020, 12:06 PM  [facility] [address] [place], [place], [address] Phone: [phone]   Fax:  [phone]  Name: TEVON BERHANE MRN: [id number] Date of Birth: 03-03-1961

## 2020-04-30 NOTE — Therapy (Signed)
[facility] [address] [place], [place], [address] Phone: [phone]   Fax:  [phone]  Speech Language Pathology Evaluation  Patient Details  Name: Brent Young MRN: [id number] Date of Birth: 19-Oct-1960 Referring Provider (SLP): Alison Murray, MD   Encounter Date: 04/30/2020   End of Session - 04/30/20 1312    Visit Number 1    Number of Visits 17    Date for SLP Re-Evaluation 07/26/20    SLP Start Time 1103    SLP Stop Time  1145    SLP Time Calculation (min) 42 min    Activity Tolerance Patient tolerated treatment well           Past Medical History:  Diagnosis Date  . Allergy   . Arthritis   . Diabetes mellitus without complication (HCC)   . Hypertension     Past Surgical History:  Procedure Laterality Date  . FRACTURE SURGERY Left    Patient fractured left arm    There were no vitals filed for this visit.   Subjective Assessment - 04/30/20 1101    Subjective "My sister tells me I slur my words."              SLP Evaluation [facility] - 04/30/20 1101      SLP Visit Information   SLP Received On 04/30/20    Referring Provider (SLP) Alison Murray, MD    Onset Date 02-29-20    Medical Diagnosis lt pontine CVA, lt parietal CVA      Subjective   Subjective "My speech was the thing that sent me to the hospital - my sister said my speech was slurred."      Pain Assessment   Currently in Pain? Yes    Pain Score 3     Pain Location Shoulder    Pain Orientation Right    Pain Type Chronic pain    Pain Onset More than a month ago    Pain Frequency Constant      Balance Screen   Has the patient fallen in the past 6 months Yes    How many times? 1      Prior Functional Status   Cognitive/Linguistic Baseline Within functional limits    Type of Home House     Lives With Alone    Education PhD    Vocation Full time employment   Nanoscience     Cognition   Overall Cognitive Status  Within Functional Limits for tasks assessed      Auditory Comprehension   Overall Auditory Comprehension Appears within functional limits for tasks assessed      Verbal Expression   Overall Verbal Expression Appears within functional limits for tasks assessed      Oral Motor/Sensory Function   Overall Oral Motor/Sensory Function Impaired    Labial ROM Within Functional Limits    Labial Symmetry Within Functional Limits    Labial Coordination Reduced    Lingual ROM Within Functional Limits    Lingual Symmetry Within Functional Limits    Lingual Strength Reduced Right    Lingual Coordination Reduced      Motor Speech   Overall Motor Speech Impaired    Articulation Within functional limitis    Intelligibility Intelligible    Effective Techniques Over-articulate                    Pt had modified barium swallow (MBS) at [facility] outpatient services on 03-12-20. Pt tells SLP today  that frequency of cough has decreased since d/c from this time (late December 2021). Results copied below:  Today's MBS findings revealed mild pharyngeal phase dysphagia. While aspiration was not visualized during the confines of this study, chronic penetration with thin liquids was observed with and without use of strategies/postures. The following penetration events were noted: - Penetration of thin liquids during the swallow via cup - Flash penetration of thin liquids during the swallow via straw - Penetration of thin liquids before and during the swallow via straw with use of chin tuck posture - Penetration of thin liquids during the swallow with use of head turn to the R  Pharyngeal deficits include: - Delayed initiation of the swallow with delayed epiglottic inversion - Reduced vestibular closure - Intermittent backflow of barium into the pyriform sinuses contributing up to min residues after the swallow  At this time, SLP recommends continuing with a diet of regular consistency and thin  liquids and meds whole in puree. Fortuantely, Pt has been without any respiratory compromise to date. Pt notes he has the tendency to eat/drink finds and states that when he slows down and takes smaller sips, his symptoms of dysphagia reduce significantly. If Pt can continue with this behavioral change of taking small bites/sips and consuming PO at a slow rate, Pt should be safe to continue thin liquids. Offer H2O above other liquids. Ongoing ST services warranted in inpatient rehab setting.   Prognosis: Good Prognosis Considerations: Co-morbidities  Recommendations:  Nutritional Recommendation: Regular diet;Thin Liquids/0 Recommended Form of Meds: Whole;With puree Compensatory Swallowing Strategies: Feed only when fully awake/alert;Remain upright 20-30 min after eating;Upright as possible for oral intake;Watch for swallow before next bite/sip;Small, single bites/sips;Eat/feed slowly           SLP Education - 04/30/20 1638    Education Details eval results, possible goals, effortful swallow, dysarthria HEP (strength)    Person(s) Educated Patient    Methods Explanation;Demonstration;Verbal cues;Handout    Comprehension Verbalized understanding;Returned demonstration;Verbal cues required;Need further instruction            SLP Short Term Goals - 04/30/20 1641      SLP SHORT TERM GOAL #1   Title pt will maintain 100% intelligibility in 15 minutes mod complex/complex conversation with dysarthria compensations PRN over 3 visits    Time 4    Period Weeks    Status New      SLP SHORT TERM GOAL #2   Title pt will complete HEP for oral articulator strength with rare min A in 2 visits    Time 4    Period Weeks    Status New      SLP SHORT TERM GOAL #3   Title pt will demonstrate no overt s/sx aspiration during PO liquids during 3 therapy sessions    Time 4    Period Weeks    Status New            SLP Long Term Goals - 04/30/20 1646      SLP LONG TERM GOAL #1   Title pt  will maintain 100% intelligibility in 15 minutes complex conversation with dysarthria compensations PRN over 3 visits    Time 8    Period Weeks   or 17 sessions, for all LTGs   Status New      SLP LONG TERM GOAL #2   Title pt will complete HEP for oral articulator strength with rare min A in 2 visits    Time 8    Period  Weeks    Status New      SLP LONG TERM GOAL #3   Title pt will demo WNL procedure in dysphagia HEP over 2 sessions    Time 8    Period Weeks    Status New            Plan - 04/30/20 1312    Clinical Impression Statement Brent Young presents today with mild dysarthria and possibly mild dysphagia (reports coughing frequency has decr'd since modified (MBSS) in late December but pt is still "careful" when he swallows); pt was 100% intelligible in mod complex conversation today throughout the eval, however Brent Young demonstrates some oral weakness and incoordination and will benefit from skilled ST with an HEP targeting strengthening his articulators in order to improve speech clarity to PLOF, and for training how to habitualize compensatory measures for his dysarthria. Today, pt was also provided effortful swallow to incr muscle strength to keep tight seal of epiglottis over trachea Brent Young will be taught next session). Pt reports, "My sister tells me I slur my words".    Speech Therapy Frequency 2x / week    Duration 8 weeks   or 17 visits   Treatment/Interventions Aspiration precaution training;Pharyngeal strengthening exercises;Diet toleration management by SLP;Trials of upgraded texture/liquids;Internal/external aids;Patient/family education;Compensatory strategies;SLP instruction and feedback;Functional tasks;Oral motor exercises    Potential to Achieve Goals Good    Potential Considerations --    SLP Home Exercise Plan provided today    Consulted and Agree with Plan of Care Patient           Patient will benefit from skilled therapeutic intervention in order  to improve the following deficits and impairments:   Dysarthria and anarthria  Dysphagia, pharyngeal phase    Problem List There are no problems to display for this patient.   [facility] ,MS, CCC-SLP  04/30/2020, 4:49 PM  [facility] [address] [place], [place], [address] Phone: [phone]   Fax:  [phone]  Name: Brent Young MRN: [id number] Date of Birth: September 12, 1960

## 2020-04-30 NOTE — Patient Instructions (Addendum)
.         Lie on back holding wand. Raise arms over head. Hold 5sec. Repeat 10 times per set.  Do 2-3 sessions per day.            Press-Up With Wand   Press wand up until elbows are straight, then reach wand over head to a pain free range. Hold 5 seconds. Repeat 10 times. Do 2-3 sessions per day.      

## 2020-04-30 NOTE — Patient Instructions (Signed)
Speech Exercises  Repeat each phrase 2-3 times, 2-3 times a day  Call the cat "Buttercup" A calendar of [place] Four floors to cover Yellow oil ointment Fellow lovers of felines Catastrophe in [place] Plump plumbers' plums The church's chimes chimed Telling time 'til eleven Five valve levers Keep the gate closed Go see that guy Fat cows give milk [employer] Gophers Fat frogs flip freely [employer] into bed Get that game to [employer] Thick thistles stick together Cinnamon aluminum linoleum Black bugs blood Lovely lemon linament Red leather, yellow leather  Big grocery buggy    Purple baby carriage [facility] Proper copper coffee pot Ripe purple cabbage Three free throws [employer] tackled  [employer] dipped the dessert  [employer] Buckle that [employer] Shirts shrink, shells shouldn't [place] 49ers Take the tackle box File the flash message Give me five flapjacks Fundamental relatives Dye the pets purple Talking [place] time after time Dark chocolate chunks Political landscape of the kingdom Actuary genius We played yo-yos yesterday   Effortful swallow  - Swallow like you are swallowing a golf ball, or a saltine with peanut butter 10-15x, 2-3x a day

## 2020-04-30 NOTE — Therapy (Signed)
[facility] [address] [place], [place], [address] Phone: [phone]   Fax:  [phone]  Physical Therapy Treatment  Patient Details  Name: Brent Young MRN: [id number] Date of Birth: 1960/07/05 Referring Provider (PT): Alison Murray, MD.   Encounter Date: 04/30/2020   PT End of Session - 04/30/20 0939    Visit Number 2    Number of Visits 13    Date for PT Re-Evaluation 06/24/20   POC for 8 weeks, Cert for 90 days   Authorization Type [employer]    PT Start Time 0930    PT Stop Time 1012    PT Time Calculation (min) 42 min    Equipment Utilized During Treatment Gait belt    Activity Tolerance Patient tolerated treatment well    Behavior During Therapy [facility] for tasks assessed/performed           Past Medical History:  Diagnosis Date  . Allergy   . Arthritis   . Diabetes mellitus without complication (HCC)   . Hypertension     Past Surgical History:  Procedure Laterality Date  . FRACTURE SURGERY Left    Patient fractured left arm    Vitals:   04/30/20 0941 04/30/20 1005  BP: 130/82 (!) 148/86  Pulse: 80 82     Subjective Assessment - 04/30/20 0932    Subjective Patient has been seen by PCP. They scheduled an orthopedic doctor, but he did not attend this appointment. Patient has not rescheduled at this time. No other new changes/complaints since last visit. No falls.    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 Yes    Pain Score 3    during rest; 7-8 with movement   Pain Location Shoulder    Pain Orientation Right    Pain Descriptors / Indicators Aching    Pain Type Chronic pain    Pain Onset More than a month ago    Aggravating Factors  movement    Pain Relieving Factors rest               [facility] Adult PT Treatment/Exercise - 04/30/20 0001      Transfers   Transfers Sit to Stand;Stand to Sit    Sit to Stand 5: Supervision    Stand to Sit 5: Supervision    Comments completed 2 x 5 reps of sit <> stands from mat with UE support on legs. Rest break required between sets due to fatigue.      Ambulation/Gait   Ambulation/Gait Yes    Ambulation/Gait Assistance 5: Supervision    Ambulation/Gait Assistance Details completed ambulation into therapy session with [facility] however patient carrying SPC instead of utilziing. increased balance challenge. PT educating on use of SPC for improved safety.    Ambulation Distance (Feet) 50 Feet    Assistive device Straight cane    Gait Pattern Step-through pattern    Ambulation Surface Level;Indoor      Exercises   Exercises Other Exercises    Other Exercises  Established initial HEP focused on seated/standing strengthening.           Completed all of the following exercises as established initial HEP. Verbal cues required for proper completion. PT educating on continue to NWB on RUE at this  time.   Access Code: [email or web address] URL: [email or web address] Date: 04/30/2020 Prepared by: Jethro Bastos  Exercises Sit to Stand with Hands on Knees - 1 x daily - 5 x weekly - 3 sets - 5 reps Seated March with Resistance - 1 x daily - 5 x weekly - 2 sets - 10 reps Standing Heel Raise with Support - 1 x daily - 5 x weekly - 2 sets - 10 reps Standing Hip Abduction with Counter Support - 1 x daily - 5 x weekly - 2 sets - 10 reps        PT Education - 04/30/20 1103    Education Details Educated on Initial HEP    Person(s) Educated Patient    Methods Explanation;Demonstration;Handout    Comprehension Verbalized understanding;Returned demonstration;Verbal cues required            PT Short Term Goals - 04/30/20 1105      PT SHORT TERM GOAL #1   Title Patient will be independent with initial balance/strengthening HEP  (all STGs Due: 05/21/20)    Baseline no HEP established    Time 4    Period Weeks    Status New    Target Date 05/21/20      PT SHORT TERM GOAL #2   Title Patient will improve 5x sit <> stand to </= 25 seconds to demonstrate improved mobility and balance.    Baseline 31.07 secs w/ UE    Time 4    Period Weeks    Status New      PT SHORT TERM GOAL #3   Title Patient will improve Berg Balance to >/= 42/56 to demonstrate improved balance and reduced risk for falls    Baseline 38/56    Time 4    Period Weeks    Status New      PT SHORT TERM GOAL #4   Title Patient will demonstrate ability to ascend/descend 12 stairs with SPC vs. rail with supervision for improved household mobility    Baseline L Rail CGA x 4 stairs    Time 4    Period Weeks    Status New             PT Long Term Goals - 04/30/20 1106      PT LONG TERM GOAL #1   Title Patient will be independent with Final HEP for strengthening/balance (All LTGs Due: 06/11/20)    Baseline no HEP established    Time 8    Period Weeks    Status New    Target Date 06/11/20      PT LONG TERM GOAL #2   Title Patient will improve Berg Balance to >/= 45/56 to demonstrate improved balance and reduced risk for falls    Baseline 38/56    Time 8    Period Weeks    Status New      PT LONG TERM GOAL #3   Title Patient will improve TUG to </= 12 seconds with LRAD to demonstrate reduced fall risk    Baseline 14.81 secs    Time 8    Period Weeks    Status New      PT LONG TERM GOAL #4   Title Patient will improve 5x sit <> stand to </= 20 seconds to demonstrate improved mobility and reduced fall risk    Baseline 31.07 secs    Time 8    Period Weeks    Status New      PT LONG TERM GOAL #  5   Title Patient will improve gait speed to >/= 3.0 ft/sec to demonstrate improved community mobility    Baseline 2.78 ft/sec    Time 8    Period Weeks    Status New      PT LONG TERM GOAL #6   Title Patient will improve FOTO to >/= 72%  upon discharge    Baseline 58%    Time 8    Period Weeks    Status New                 Plan - 04/30/20 1103    Clinical Impression Statement Today's skilled PT session focused on establishing initial HEP for strengthening activites in seated/standing. Patient tolreating well, but did require frequent rest breaks due to fatigue and SOB. Vitals were WNL. Patient ambulating into session with SPC but not utilizing AD appropraitely, PT educating on proper use for improved balance and safety. Will continue to progress toward all LTGs. STG and LTG goal date pushed back due to absence from therapy services.    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, obesity    Examination-Activity Limitations Bathing;Dressing;Locomotion Level;Stand;Stairs    Examination-Participation Restrictions Occupation;Driving;Community Activity    Stability/Clinical Decision Making Evolving/Moderate complexity    Rehab Potential Good    PT Frequency 1x / week    PT Duration 4 weeks   initially due to transportation, followed by 2x/week for 4 weeks   PT Treatment/Interventions ADLs/Self Care Home Management;Cryotherapy;Electrical Stimulation;Moist Heat;DME Instruction;Gait training;Stair training;Functional mobility training;Therapeutic activities;Therapeutic exercise;Balance training;Neuromuscular re-education;Patient/family education;Orthotic Fit/Training;Manual techniques;Vestibular;Passive range of motion    PT Next Visit Plan How was HEP? 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   Tempie Donning, PT, DPT 04/30/2020, 11:09 AM  [facility] [address] [place], [place], [address] Phone: [phone]   Fax:  [phone]  Name: Brent Young MRN: [id number] Date of Birth: Sep 12, 1960

## 2020-04-30 NOTE — Patient Instructions (Signed)
Access Code: [email or web address] URL: [email or web address] Date: 04/30/2020 Prepared by: Jethro Bastos  Exercises Sit to Stand with Hands on Knees - 1 x daily - 5 x weekly - 3 sets - 5 reps Seated March with Resistance - 1 x daily - 5 x weekly - 2 sets - 10 reps Standing Heel Raise with Support - 1 x daily - 5 x weekly - 2 sets - 10 reps Standing Hip Abduction with Counter Support - 1 x daily - 5 x weekly - 2 sets - 10 reps

## 2020-05-02 ENCOUNTER — Ambulatory Visit (INDEPENDENT_AMBULATORY_CARE_PROVIDER_SITE_OTHER): Payer: Self-pay | Admitting: Surgical

## 2020-05-02 ENCOUNTER — Encounter: Payer: Self-pay | Admitting: Surgical

## 2020-05-02 ENCOUNTER — Ambulatory Visit: Payer: Self-pay | Admitting: Occupational Therapy

## 2020-05-02 ENCOUNTER — Ambulatory Visit: Payer: Self-pay

## 2020-05-02 ENCOUNTER — Other Ambulatory Visit: Payer: Self-pay

## 2020-05-02 ENCOUNTER — Encounter: Payer: Self-pay | Admitting: Occupational Therapy

## 2020-05-02 ENCOUNTER — Ambulatory Visit (INDEPENDENT_AMBULATORY_CARE_PROVIDER_SITE_OTHER): Payer: Self-pay

## 2020-05-02 VITALS — BP 144/88

## 2020-05-02 DIAGNOSIS — R2689 Other abnormalities of gait and mobility: Secondary | ICD-10-CM

## 2020-05-02 DIAGNOSIS — M25511 Pain in right shoulder: Secondary | ICD-10-CM

## 2020-05-02 DIAGNOSIS — R471 Dysarthria and anarthria: Secondary | ICD-10-CM

## 2020-05-02 DIAGNOSIS — M6281 Muscle weakness (generalized): Secondary | ICD-10-CM

## 2020-05-02 DIAGNOSIS — R262 Difficulty in walking, not elsewhere classified: Secondary | ICD-10-CM

## 2020-05-02 DIAGNOSIS — I69851 Hemiplegia and hemiparesis following other cerebrovascular disease affecting right dominant side: Secondary | ICD-10-CM

## 2020-05-02 DIAGNOSIS — R2681 Unsteadiness on feet: Secondary | ICD-10-CM

## 2020-05-02 DIAGNOSIS — S42251S Displaced fracture of greater tuberosity of right humerus, sequela: Secondary | ICD-10-CM

## 2020-05-02 DIAGNOSIS — M25611 Stiffness of right shoulder, not elsewhere classified: Secondary | ICD-10-CM

## 2020-05-02 DIAGNOSIS — R1313 Dysphagia, pharyngeal phase: Secondary | ICD-10-CM

## 2020-05-02 DIAGNOSIS — R278 Other lack of coordination: Secondary | ICD-10-CM

## 2020-05-02 DIAGNOSIS — R41840 Attention and concentration deficit: Secondary | ICD-10-CM

## 2020-05-02 NOTE — Progress Notes (Signed)
Office Visit Note   Patient: Brent Young           Date of Birth: Aug 08, 1960           MRN: [id number] Visit Date: 05/02/2020 Requested by: Grayce Sessions, NP [address] [place],  [place] [address] PCP: Grayce Sessions, NP  Subjective: Chief Complaint  Patient presents with  . Other    Eval right shoulder s/p injury 12/2019 Prior treatment for injury done in [facility]    HPI: Brent Young is a 60 y.o. male who presents to the office complaining of right shoulder pain.  Patient states that he fell down an escalator in October 2021 which resulted in a fracture to the right shoulder.  He was hospitalized and seen by an orthopedic physician who recommended nonoperative treatment with no significant displacement of the fracture at that time.  Saw an orthopedic provider from [facility].  He states that he has had significant improvement in pain and function in the last several months but he has somewhat hit a plateau at this point.  Localizes pain to the anterior and lateral aspects of the right shoulder.  Denies any significant radiation of pain, neck pain.  Occasional numbness and tingling in the left pinky but this is the only such symptoms that he feels.  He enjoys playing golf and bicycling but he also had a recent CVA which has prevented him from doing the activities he wants to do.  He was initially on dual antiplatelet therapy following the CVA but now he is just on aspirin.  He is currently in physical therapy where they are primarily working with him on passive and active range of motion.  He states that he was referred here by his therapist so that he can have an evaluation to see if he is okay to start strengthening.  Reports his therapy facility is a current facility.  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  Does have a history of diabetes with his last A1c around 10.              ROS: All systems reviewed are negative as they relate to the chief  complaint within the history of present illness.  Patient denies fevers or chills.  Assessment & Plan: Visit Diagnoses:  1. Closed displaced fracture of greater tuberosity of right humerus, sequela   2. Right shoulder pain, unspecified chronicity     Plan: Patient is a 60 year old male who presents for evaluation of right shoulder injury from October 2021.  Hospital records from that time reviewed.  Seems patient sustained a nondisplaced greater tuberosity fracture of the right shoulder.  He was treated nonoperatively and today's radiographs do show evidence of prior fracture but seems that the fracture is healed well with some minor displacement.  There is also some sclerosis that extends from the greater tuberosity to the inferior articular surface of the humeral head with some fragmentation in this area on the axillary view that suggest there may have been some further fracture.  No evidence of any CT scan that was available in the records.  Regardless, he has excellent function of the right shoulder with active range of motion and passive range of motion of his abduction and forward flexion of the right shoulder equivalent to each other.  Excellent strength of all the rotator cuff muscles.  Should be okay for patient to start some strengthening exercises in his therapy regimen.  Plan for him  to follow-up in 6 weeks for clinical recheck with Dr. August Saucer.  He will follow-up sooner if he has any concerns or new problems that arise.  He also complains of left shoulder pain so that will be evaluated with radiographs and exam on the next visit.  Follow-Up Instructions: No follow-ups on file.   Orders:  Orders Placed This Encounter  Procedures  . XR Shoulder Right   No orders of the defined types were placed in this encounter.     Procedures: No procedures performed   Clinical Data: No additional findings.  Objective: Vital Signs: There were no vitals taken for this visit.  Physical Exam:   Constitutional: Patient appears well-developed HEENT:  Head: Normocephalic Eyes:EOM are normal Neck: Normal range of motion Cardiovascular: Normal rate Pulmonary/chest: Effort normal Neurologic: Patient is alert Skin: Skin is warm Psychiatric: Patient has normal mood and affect  Ortho Exam:  Passive ROM Right Left  External Rotation  30  45  Abduction  85  115  Forward Flexion  130  180  No crepitus with ROM 5/5 motor strength of right supraspinatus, infraspinatus, subscapularis 5/5 motor strength of grip strength, finger abduction, pronation/supination, bicep, tricep, deltoid bilaterally No tenderness to palpation over the bicipital groove, AC joint Positive Neers and negative hawkins impingement signs.  Negative O'Brien sign.  Negative drop arm test. No pain with c-spine ROM  Specialty Comments:  No specialty comments available.  Imaging: No results found.   PMFS History: There are no problems to display for this patient.  Past Medical History:  Diagnosis Date  . Allergy   . Arthritis   . Diabetes mellitus without complication (HCC)   . Hypertension     Family History  Problem Relation Age of Onset  . Colon cancer Maternal Grandmother   . Esophageal cancer Neg Hx   . Stomach cancer Neg Hx   . Rectal cancer Neg Hx     Past Surgical History:  Procedure Laterality Date  . FRACTURE SURGERY Left    Patient fractured left arm   Social History   Occupational History  . Not on file  Tobacco Use  . Smoking status: Never Smoker  . Smokeless tobacco: Never Used  Vaping Use  . Vaping Use: Never used  Substance and Sexual Activity  . Alcohol use: Yes    Comment: occasionally  . Drug use: No  . Sexual activity: Not on file

## 2020-05-02 NOTE — Therapy (Signed)
[facility] [address] [place], [place], [address] Phone: [phone]   Fax:  [phone]  Occupational Therapy Treatment  Patient Details  Name: Brent Young MRN: [id number] Date of Birth: 1960/10/12 No data recorded  Encounter Date: 05/02/2020   OT End of Session - 05/02/20 1017    Visit Number 3    Number of Visits 13    Date for OT Re-Evaluation 05/21/20    Authorization Type [employer]    OT Start Time 1017    OT Stop Time 1055    OT Time Calculation (min) 38 min    Activity Tolerance Patient tolerated treatment well    Behavior During Therapy [facility] for tasks assessed/performed           Past Medical History:  Diagnosis Date  . Allergy   . Arthritis   . Diabetes mellitus without complication (HCC)   . Hypertension     Past Surgical History:  Procedure Laterality Date  . FRACTURE SURGERY Left    Patient fractured left arm    There were no vitals filed for this visit.   Subjective Assessment - 05/02/20 1017    Subjective  "Shoulder is a little nagging"    Pertinent History PMH: CVA, HTN, RUE fx humerus (12/2019), Covid Dec 2020, Peripheral Neuropathy, CHF, cirrhosis of liver    Limitations No driving. 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 Yes    Pain Score 7     Pain Location Shoulder    Pain Orientation Right    Pain Descriptors / Indicators Nagging    Pain Type Acute pain    Pain Onset More than a month ago    Pain Frequency Intermittent    Aggravating Factors  7/10 with reaching behind, 5/10 in front                        OT Treatments/Exercises (OP) - 05/02/20 1039      Exercises   Exercises Work Hardening      Shoulder Exercises: Seated   Other Seated Exercises using medium size ball for shoulder flexion, horizontal abudction, chest press x 10 reps.  Shoulder extension x 10 with RUE      Shoulder Exercises: ROM/Strengthening   Ball on Wall x 10      Work Hardening Exercises   UBE (Upper Arm Bike) level 2 x 6 minutes forward and x 6 minutes backward. minimal pain reported - mostly sore/fatigue                    OT Short Term Goals - 05/02/20 1037      OT SHORT TERM GOAL #1   Title Pt will be independent with HEP 04/23/20    Time 4    Period Weeks    Status On-going    Target Date 04/23/20      OT SHORT TERM GOAL #2   Title Pt will perform LB dressing (with socks and shoe) with supervision    Baseline min A    Time 4    Period Weeks    Status On-going      OT SHORT TERM GOAL #3   Title Pt will verbalize understanding of memory compensatory strategies    Time 4    Period Weeks  Status On-going      OT SHORT TERM GOAL #4   Title Pt will demonstrate improved shoulder flexion of at least 120 degrees    Baseline 110 RUE    Time 4    Period Weeks    Status On-going      OT SHORT TERM GOAL #5   Title Pt will perform environmental scanning in a moderately distracting environment with 75% accuracy.    Time 4    Period Weeks    Status New             OT Long Term Goals - 03/26/20 1652      OT LONG TERM GOAL #1   Title Pt will be independent with updated HEP 05/21/2020    Time 8    Period Weeks    Status New    Target Date 05/21/20      OT LONG TERM GOAL #2   Title Pt will perform physical and cognitive task simultaneously with 95% accuracy    Time 8    Period Weeks    Status New      OT LONG TERM GOAL #3   Title Pt will complete mod complex cooking with supervision and good safety awareness    Time 8    Period Weeks    Status New      OT LONG TERM GOAL #4   Title Pt will demonstrate improved shoulder flexion of at least 130 degrees to obtain object from high level shelf with pain equal to or less than 5/10    Baseline RUE 110    Time 8    Period Weeks    Status New      OT LONG TERM GOAL  #5   Title Pt will improve RUE range of motion and report greater ease and comfort for toilet hygiene.    Time 8    Period Weeks    Status New      Long Term Additional Goals   Additional Long Term Goals Yes      OT LONG TERM GOAL #6   Title Pt will complete all basic ADLs with mod I    Time 8    Period Weeks    Status New                 Plan - 05/02/20 1037    Clinical Impression Statement Pt is progressing towards goals. Pt is going to orthopedist today to get clarification for precautions for RUE.    OT Occupational Profile and History Problem Focused Assessment - Including review of records relating to presenting problem    Occupational performance deficits (Please refer to evaluation for details): ADL's    Body Structure / Function / Physical Skills ADL;Balance;Decreased knowledge of precautions;Decreased knowledge of use of DME;IADL;GMC;Strength;Pain;UE functional use;ROM;Mobility;FMC    Cognitive Skills Thought;Understand;Problem Solve;Memory    Rehab Potential Good    Clinical Decision Making Limited treatment options, no task modification necessary    Comorbidities Affecting Occupational Performance: None    Modification or Assistance to Complete Evaluation  No modification of tasks or assist necessary to complete eval    OT Frequency --   1x/week for 4 weeks and 2x/week for 4 weeks   OT Duration 8 weeks   1x/week for 4 weeks then 2x/week for 4 weeks or total of 12 visits over extended period d/t scheduling difficulties   OT Treatment/Interventions Self-care/ADL training;Cryotherapy;Aquatic Therapy;Moist Heat;Electrical Stimulation;Ultrasound;Fluidtherapy;Paraffin;Therapeutic exercise;Neuromuscular education;DME and/or AE instruction;Manual Therapy;Functional Mobility Training;Passive  range of motion;Cognitive remediation/compensation;Therapeutic activities;Patient/family education;Energy conservation    Plan supine shoulder HEP, get  clarification on shoulder  precautions    Consulted and Agree with Plan of Care Patient           Patient will benefit from skilled therapeutic intervention in order to improve the following deficits and impairments:   Body Structure / Function / Physical Skills: ADL,Balance,Decreased knowledge of precautions,Decreased knowledge of use of DME,IADL,GMC,Strength,Pain,UE functional use,ROM,Mobility,FMC Cognitive Skills: Thought,Understand,Problem Solve,Memory     Visit Diagnosis: 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   Junious Dresser MOT, OTR/L  05/02/2020, 11:38 AM  [facility] [address] [place], [place], [address] Phone: [phone]   Fax:  [phone]  Name: Brent Young MRN: [id number] Date of Birth: 1960/10/28

## 2020-05-02 NOTE — Therapy (Signed)
[facility] [address] [place], [place], [address] Phone: [phone]   Fax:  [phone]  Speech Language Pathology Treatment  Patient Details  Name: Brent Young MRN: [id number] Date of Birth: [birth year] Referring Provider (SLP): Alison Murray, MD   Encounter Date: 05/02/2020   End of Session - 05/02/20 0930    Visit Number 2    Number of Visits 17    Date for SLP Re-Evaluation 07/26/20    SLP Start Time 0847    SLP Stop Time  0930    SLP Time Calculation (min) 43 min    Activity Tolerance Patient tolerated treatment well           Past Medical History:  Diagnosis Date  . Allergy   . Arthritis   . Diabetes mellitus without complication (HCC)   . Hypertension     Past Surgical History:  Procedure Laterality Date  . FRACTURE SURGERY Left    Patient fractured left arm    There were no vitals filed for this visit.   Subjective Assessment - 05/02/20 0851    Subjective "I'm having a rough day" re: insurance copay    Currently in Pain? No/denies                 ADULT SLP TREATMENT - 05/02/20 0857      General Information   Behavior/Cognition Alert;Cooperative;Pleasant mood      Treatment Provided   Treatment provided Cognitive-Linquistic;Dysphagia      Dysphagia Treatment   Treatment Methods Therapeutic exercise;Patient/caregiver education    Other treatment/comments SLP provided handout and education re: Mendelsohn manuever for mild pharyngeal deficits/ epiglottic inversion and oral strengthening exercises for articulators. SLP provided extensive modeling and tactile cues for accurate demonstration of Mendelsohn.      Cognitive-Linquistic Treatment   Treatment focused on Dysarthria;Patient/family/caregiver education    Skilled Treatment Pt inquired about co-pay for OP therapeutic services. SLP explained ST charge for this session. SLP provided resources and recommended calling  insurance provided with questions. HEP updated for dysarthria for additional exercises to target. SLP modeling provided for recommended labial and lingual exercises. Min A required for oral stregthening exercises for accuracy.      Assessment / Recommendations / Plan   Plan Continue with current plan of care      Progression Toward Goals   Progression toward goals Progressing toward goals            SLP Education - 05/02/20 0920    Education Details Dysarthria and dysphagia HEP, Mendelsohn, oral stregthening exercises    Person(s) Educated Patient    Methods Explanation;Demonstration;Tactile cues;Verbal cues;Handout    Comprehension Verbalized understanding;Returned demonstration;Tactile cues required;Verbal cues required;Need further instruction            SLP Short Term Goals - 05/02/20 0929      SLP SHORT TERM GOAL #1   Title pt will maintain 100% intelligibility in 15 minutes mod complex/complex conversation with dysarthria compensations PRN over 3 visits    Time 4    Period Weeks    Status On-going      SLP SHORT TERM GOAL #2   Title pt will complete HEP for oral articulator strength with rare min A in 2 visits    Time 4    Period Weeks    Status On-going      SLP SHORT TERM GOAL #3   Title pt will demonstrate no overt s/sx aspiration during PO liquids during 3 therapy  sessions    Time 4    Period Weeks    Status On-going            SLP Long Term Goals - 05/02/20 0941      SLP LONG TERM GOAL #1   Title pt will maintain 100% intelligibility in 15 minutes complex conversation with dysarthria compensations PRN over 3 visits    Time 8    Period Weeks   or 17 sessions, for all LTGs   Status On-going      SLP LONG TERM GOAL #2   Title pt will complete HEP for oral articulator strength with rare min A in 2 visits    Time 8    Period Weeks    Status On-going      SLP LONG TERM GOAL #3   Title pt will demo WNL procedure in dysphagia HEP over 2 sessions     Time 8    Period Weeks    Status On-going            Plan - 05/02/20 0925    Clinical Impression Statement Brent Young presents today with mild dysarthria and possibly mild dysphagia(reports coughing frequency has decr'd since MBS in late December but pt is still "careful" when he swallows); pt was 100% intelligible in mod complex conversation today throughout session, however Peirce demonstrates some oral weakness and incoordination and will benefit from skilled ST with an HEP targeting strengthening his articulators in order to improve speech clarity to PLOF, and for training how to habitualize compensatory measures for his dysarthria. Today, SLP educated and modeled Mendelsohn exercises for pharyngeal deficits and labial/lingual exercises for oral strengthening. Recommend skilled ST to maximize communication and swallow function.    Speech Therapy Frequency 2x / week    Duration 8 weeks    Treatment/Interventions Compensatory strategies;Cueing hierarchy;Functional tasks;Patient/family education;Pharyngeal strengthening exercises;SLP instruction and feedback    Potential to Achieve Goals Good    Potential Considerations Cooperation/participation level    SLP Home Exercise Plan updated HEP for dysarthria and dysphagia    Consulted and Agree with Plan of Care Patient           Patient will benefit from skilled therapeutic intervention in order to improve the following deficits and impairments:   Dysarthria and anarthria  Dysphagia, pharyngeal phase    Problem List There are no problems to display for this patient.  Session documented by Janann Colonel, MA CCC-SLP; however, treatment provided by Verdie Mosher, MA CCC-SLP  Janann Colonel 05/02/2020, 9:49 AM  [facility] [address] [place], [place], [address] Phone: [phone]   Fax:  [phone]   Name: Brent Young MRN: [id number] Date of Birth:  April 09, 1960

## 2020-05-02 NOTE — Patient Instructions (Addendum)
Purpose: To improve lip and tongue strength in order to help people understand you. These exercises can be done while looking in a mirror.  Do them twice a day.  Lips  1. Press hard and briefly hold all the beginning sounds in these words: Repeat each set 2 times     "ma ma ma"    "boo boo boo"  "pa pa pa."          "moo moo moo" "bye bye bye"  "pie pie pie"          "me me me"  "bee bee bee"  "pea pea pea"   2. Pucker your lips tightly and say "OOOO," then smile wide and say "EEEE" x10 repetitions.  Repeat _2_ times.  3. Practice whistling for __30__ seconds.  4. "Blow out" candles.  Repeat _10___ times.  5. Blow kisses - make them LONG and LOUD!  Repeat __10__ times.   Tip of Tongue  1. Say the sound  "ta ta ta," "la la la,"          and "Research scientist (physical sciences)."  Repeat __2__ times.               "tee tee tee" "lee lee lee"   "dee dee dee"        "too too too" "loo loo loo"  "do do do"  2. Say "time," "took," "take," "town," and "Tom."  Repeat __2__ times.  3. Say "long," "look," "low," "lay" and "lie."  Repeat _2__ times.  4. Say "name," "neck," "not," "new," and "no."  Repeat __2_ times.  5. Say "down," "dip," "dot," "Don," and "do."  Repeat __2__ times   Back of Tongue  1. Say the sounds "ka ka ka" and "go go go."   Repeat ___2_ times.       "cow cow cow" "guy guy guy"       "koo koo koo" "goo goo goo"  2. Say "kick," "cake," "Jae Dire," "key," "keep," "kite," and "Ken."  Repeat __2__ times.  3. Say "gate," "gag," "got," "get," "gone," and "go."  Repeat __2__ times.      [employer]  . Try to do a long, slow swallow.  At the height of your swallow (when you gulp), hold your throat in the upright position for 3-5 seconds, then relax. . Aim for 3-4 sets of 10 swallows, with a 5 minute rest in between.

## 2020-05-02 NOTE — Patient Instructions (Signed)
Access Code: [email or web address] URL: [email or web address] Date: 05/02/2020 Prepared by: Jethro Bastos  Exercises Sit to Stand with Hands on Knees - 1 x daily - 5 x weekly - 3 sets - 5 reps Seated March with Resistance - 1 x daily - 5 x weekly - 2 sets - 10 reps Standing Heel Raise with Support - 1 x daily - 5 x weekly - 2 sets - 10 reps Standing Hip Abduction with Counter Support - 1 x daily - 5 x weekly - 2 sets - 10 reps Standing with Head Rotation on Pillow - 1 x daily - 5 x weekly - 2 sets - 10 reps

## 2020-05-02 NOTE — Therapy (Signed)
[facility] [address] [place], [place], [address] Phone: [phone]   Fax:  [phone]  Physical Therapy Treatment  Patient Details  Name: Brent Young MRN: [id number] Date of Birth: 1960-10-07 Referring Provider (PT): Alison Murray, MD.   Encounter Date: 05/02/2020   PT End of Session - 05/02/20 0933    Visit Number 3    Number of Visits 13    Date for PT Re-Evaluation 06/24/20   POC for 8 weeks, Cert for 90 days   Authorization Type [employer]    PT Start Time [phone]   patient talking with supervisor about self pay and billing prior to session and needing to use bathroom as well   PT Stop Time 1015    PT Time Calculation (min) 27 min    Equipment Utilized During Treatment Gait belt    Activity Tolerance Patient tolerated treatment well    Behavior During Therapy [facility] for tasks assessed/performed           Past Medical History:  Diagnosis Date  . Allergy   . Arthritis   . Diabetes mellitus without complication (HCC)   . Hypertension     Past Surgical History:  Procedure Laterality Date  . FRACTURE SURGERY Left    Patient fractured left arm    Vitals:   05/02/20 0953  BP: (!) 144/88     Subjective Assessment - 05/02/20 0932    Subjective Patient got apppt scheduled with Orthopedic Doctor for Shoulder. Reports exercises went well.    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 No/denies    Pain Onset More than a month ago                [facility] Adult PT Treatment/Exercise - 05/02/20 0001      Ambulation/Gait   Ambulation/Gait Yes    Ambulation/Gait Assistance 5: Supervision;4: Min guard    Ambulation/Gait Assistance Details completed ambulation x 230 ft around therapy gym with PT  providing CGA and verbal cues for improved step length to promote reciprocal gait pattern.    Ambulation Distance (Feet) 230 Feet    Assistive device None    Gait Pattern Step-through pattern    Ambulation Surface Level;Indoor    Gait Comments intermittent balance challenge at times but able to maintain balance without assistance from PT.      Neuro Re-ed    Neuro Re-ed Details  Completed toe taps to 6" steps, 2 x 10 reps. Icreased challengewith SLS on RLE, with CGA required due to imbalance. Intermittent rest breaks required due to SOB (5/10 noted), Vitals WNL.               Balance Exercises - 05/02/20 0001      Balance Exercises: Standing   Standing Eyes Opened Narrow base of support (BOS);Head turns;Foam/compliant surface;Solid surface;Limitations    Standing Eyes Opened Limitations initially on firm surface with narrow BOS completed horizontal/vertical head turns x 10 reps each direction. Progressed to completed with narrow BOS on complaint surface, x 10 reps of horizontal/vertical head turns. continue to demo increased challenge with vertical > horizontal.    Standing Eyes Closed Narrow base of support (BOS);Head turns;Foam/compliant surface;Solid surface;Limitations    Standing Eyes Closed Limitations with narrow BOS on firm  surface completed eyes closed 2 x 30 seconds, minimal sway noted. progressed to completing on complaint surface (blue mat) with eyes closed 3 x 30 seconds increased sway noted on complaint surface. With feet shoulder width on complaint surface completed standing eye closed with horizontal/vertical head turns 2 x 10 reps each direction. Increased challenge with vertical > horizontal.    Other Standing Exercises Comments Add standing head turns with eyes closed to HEP. Educated to complete activity in corner with chair placed in front for improved safety.          Access Code: [email or web address] URL: [email or web address] Date: 05/02/2020 Prepared by:  Jethro Bastos  Exercises Sit to Stand with Hands on Knees - 1 x daily - 5 x weekly - 3 sets - 5 reps Seated March with Resistance - 1 x daily - 5 x weekly - 2 sets - 10 reps Standing Heel Raise with Support - 1 x daily - 5 x weekly - 2 sets - 10 reps Standing Hip Abduction with Counter Support - 1 x daily - 5 x weekly - 2 sets - 10 reps Standing with Head Rotation on Pillow - 1 x daily - 5 x weekly - 2 sets - 10 reps     PT Education - 05/02/20 1053    Education Details HEP Update    Person(s) Educated Patient    Methods Explanation;Demonstration;Handout    Comprehension Verbalized understanding;Returned demonstration            PT Short Term Goals - 04/30/20 1105      PT SHORT TERM GOAL #1   Title Patient will be independent with initial balance/strengthening HEP (all STGs Due: 05/21/20)    Baseline no HEP established    Time 4    Period Weeks    Status New    Target Date 05/21/20      PT SHORT TERM GOAL #2   Title Patient will improve 5x sit <> stand to </= 25 seconds to demonstrate improved mobility and balance.    Baseline 31.07 secs w/ UE    Time 4    Period Weeks    Status New      PT SHORT TERM GOAL #3   Title Patient will improve Berg Balance to >/= 42/56 to demonstrate improved balance and reduced risk for falls    Baseline 38/56    Time 4    Period Weeks    Status New      PT SHORT TERM GOAL #4   Title Patient will demonstrate ability to ascend/descend 12 stairs with SPC vs. rail with supervision for improved household mobility    Baseline L Rail CGA x 4 stairs    Time 4    Period Weeks    Status New             PT Long Term Goals - 04/30/20 1106      PT LONG TERM GOAL #1   Title Patient will be independent with Final HEP for strengthening/balance (All LTGs Due: 06/11/20)    Baseline no HEP established    Time 8    Period Weeks    Status New    Target Date 06/11/20      PT LONG TERM GOAL #2   Title Patient will improve Berg Balance to >/=  45/56 to demonstrate improved balance and reduced risk for falls    Baseline 38/56    Time 8    Period Weeks    Status New  PT LONG TERM GOAL #3   Title Patient will improve TUG to </= 12 seconds with LRAD to demonstrate reduced fall risk    Baseline 14.81 secs    Time 8    Period Weeks    Status New      PT LONG TERM GOAL #4   Title Patient will improve 5x sit <> stand to </= 20 seconds to demonstrate improved mobility and reduced fall risk    Baseline 31.07 secs    Time 8    Period Weeks    Status New      PT LONG TERM GOAL #5   Title Patient will improve gait speed to >/= 3.0 ft/sec to demonstrate improved community mobility    Baseline 2.78 ft/sec    Time 8    Period Weeks    Status New      PT LONG TERM GOAL #6   Title Patient will improve FOTO to >/= 72% upon discharge    Baseline 58%    Time 8    Period Weeks    Status New                 Plan - 05/02/20 0933    Clinical Impression Statement Session limited due to patient using bathroom prior to session and speaking with Supervisor regarding billing questions. Completed gait training without AD working toward improved step length bilaterally, intermittent CGA required for proper completion. Continued balance actviites working on complaint surfaces and promoting improved SLS. Increased challenge noted with RLE SLS activity. Will continue to progress toward all LTGs.    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, obesity    Examination-Activity Limitations Bathing;Dressing;Locomotion Level;Stand;Stairs    Examination-Participation Restrictions Occupation;Driving;Community Activity    Stability/Clinical Decision Making Evolving/Moderate complexity    Rehab Potential Good    PT Frequency 1x / week    PT Duration 4 weeks    initially due to transportation, followed by 2x/week for 4 weeks   PT Treatment/Interventions ADLs/Self Care Home Management;Cryotherapy;Electrical Stimulation;Moist Heat;DME Instruction;Gait training;Stair training;Functional mobility training;Therapeutic activities;Therapeutic exercise;Balance training;Neuromuscular re-education;Patient/family education;Orthotic Fit/Training;Manual techniques;Vestibular;Passive range of motion    PT Next Visit Plan How was HEP? Continued balance/strengthening    Consulted and Agree with Plan of Care Patient           Patient will benefit from skilled therapeutic intervention in order to improve the following deficits and impairments:  Abnormal gait,Decreased balance,Decreased endurance,Difficulty walking,Impaired sensation,Pain,Impaired UE functional use,Decreased strength,Decreased safety awareness,Decreased knowledge of use of DME,Decreased coordination,Decreased activity tolerance,Dizziness  Visit Diagnosis: Difficulty in walking, not elsewhere classified  Muscle weakness (generalized)  Other abnormalities of gait and mobility  Unsteadiness on feet     Problem List There are no problems to display for this patient.   Tempie Donning, PT, DPT 05/02/2020, 10:55 AM  [facility] [address] [place], [place], [address] Phone: [phone]   Fax:  [phone]  Name: Brent Young MRN: [id number] Date of Birth: [birth year]

## 2020-05-07 ENCOUNTER — Ambulatory Visit: Payer: Self-pay

## 2020-05-07 ENCOUNTER — Ambulatory Visit: Payer: Self-pay | Admitting: Occupational Therapy

## 2020-05-09 ENCOUNTER — Ambulatory Visit: Payer: Self-pay

## 2020-05-09 ENCOUNTER — Other Ambulatory Visit: Payer: Self-pay

## 2020-05-09 ENCOUNTER — Ambulatory Visit: Payer: Self-pay | Admitting: Occupational Therapy

## 2020-05-09 VITALS — BP 176/92 | HR 88

## 2020-05-09 DIAGNOSIS — R262 Difficulty in walking, not elsewhere classified: Secondary | ICD-10-CM

## 2020-05-09 DIAGNOSIS — R2681 Unsteadiness on feet: Secondary | ICD-10-CM

## 2020-05-09 DIAGNOSIS — M6281 Muscle weakness (generalized): Secondary | ICD-10-CM

## 2020-05-09 DIAGNOSIS — R2689 Other abnormalities of gait and mobility: Secondary | ICD-10-CM

## 2020-05-09 NOTE — Therapy (Signed)
[facility] [address] [place], [place], [address] Phone: [phone]   Fax:  [phone]  Physical Therapy Arrived - No Charge  Patient Details  Name: Brent Young MRN: [id number] Date of Birth: 1960/10/14 Referring Provider (PT): Alison Murray, MD.   Encounter Date: 05/09/2020   PT End of Session - 05/09/20 0930    Visit Number 3   arrived - no charge   Number of Visits 13    Date for PT Re-Evaluation 06/24/20   POC for 8 weeks, Cert for 90 days   Authorization Type [employer]    PT Start Time 0930    PT Stop Time --    PT Time Calculation (min) --    Equipment Utilized During Treatment Gait belt    Activity Tolerance Patient tolerated treatment well    Behavior During Therapy [facility] for tasks assessed/performed           Past Medical History:  Diagnosis Date  . Allergy   . Arthritis   . Diabetes mellitus without complication (HCC)   . Hypertension     Past Surgical History:  Procedure Laterality Date  . FRACTURE SURGERY Left    Patient fractured left arm    Vitals:   05/09/20 0942 05/09/20 0952  BP: (!) 179/96 (!) 176/92  Pulse: 98 88     Subjective Assessment - 05/09/20 0931    Subjective Patient reports that appointment with Orthopedic Doctor went well. Patient reports that he would like to decrease freq to 1x/week. Patient reports that he tripped and fell yesterday, shoulder is sore. No injuries reported. Reports that he did not take his BP medications this morning.    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 Yes    Pain Score 5     Pain Location Shoulder    Pain Orientation Right    Pain Descriptors / Indicators Aching    Pain Type Acute pain    Pain Onset Yesterday             Patient arrived to therapy session today. Upon BP assessment, elevated BP readings noted including 176/96, followed by 176/92 after 5 minute rest break. Patient reporting no concerning signs/symptoms. Upon further questioning patient reporting that he did not take his BP medications this morning due to reports of side effects. PT educating on importance of taking medications as prescribed due to prior history of CVA. Patient verbalizing understanding. Patient reporting that if medication continues to produce side effects will reach out to MD regarding switching medications, PT verbalizing agreement. PT provided OT with clearance noted from Orthopedic Doctor. Patient also requesting to decrease freq to 1x/week, and PT helped patient get scheduled additional visits. PT educating to continue to monitor BP and if any concerning signs/symptoms of CVA ("BE FAST") arise to go to ED or call 911.        PT Short Term Goals - 04/30/20 1105      PT SHORT TERM GOAL #1   Title Patient will be independent with initial balance/strengthening HEP (all STGs Due: 05/21/20)    Baseline no HEP established    Time 4    Period Weeks    Status New    Target Date 05/21/20  PT SHORT TERM GOAL #2   Title Patient will improve 5x sit <> stand to </= 25 seconds to demonstrate improved mobility and balance.    Baseline 31.07 secs w/ UE    Time 4    Period Weeks    Status New      PT SHORT TERM GOAL #3   Title Patient will improve Berg Balance to >/= 42/56 to demonstrate improved balance and reduced risk for falls    Baseline 38/56    Time 4    Period Weeks    Status New      PT SHORT TERM GOAL #4   Title Patient will demonstrate ability to ascend/descend 12 stairs with SPC vs. rail with supervision for improved household mobility    Baseline L Rail CGA x 4 stairs    Time 4    Period Weeks    Status New             PT Long Term Goals - 04/30/20 1106      PT LONG TERM GOAL #1   Title Patient  will be independent with Final HEP for strengthening/balance (All LTGs Due: 06/11/20)    Baseline no HEP established    Time 8    Period Weeks    Status New    Target Date 06/11/20      PT LONG TERM GOAL #2   Title Patient will improve Berg Balance to >/= 45/56 to demonstrate improved balance and reduced risk for falls    Baseline 38/56    Time 8    Period Weeks    Status New      PT LONG TERM GOAL #3   Title Patient will improve TUG to </= 12 seconds with LRAD to demonstrate reduced fall risk    Baseline 14.81 secs    Time 8    Period Weeks    Status New      PT LONG TERM GOAL #4   Title Patient will improve 5x sit <> stand to </= 20 seconds to demonstrate improved mobility and reduced fall risk    Baseline 31.07 secs    Time 8    Period Weeks    Status New      PT LONG TERM GOAL #5   Title Patient will improve gait speed to >/= 3.0 ft/sec to demonstrate improved community mobility    Baseline 2.78 ft/sec    Time 8    Period Weeks    Status New      PT LONG TERM GOAL #6   Title Patient will improve FOTO to >/= 72% upon discharge    Baseline 58%    Time 8    Period Weeks    Status New                  Patient will benefit from skilled therapeutic intervention in order to improve the following deficits and impairments:     Visit Diagnosis: Other abnormalities of gait and mobility  Unsteadiness on feet  Muscle weakness (generalized)  Difficulty in walking, not elsewhere classified     Problem List There are no problems to display for this patient.   Tempie Donning, PT, DPT 05/09/2020, 9:52 AM  [facility] [address] [place], [place], [address] Phone: [phone]   Fax:  [phone]  Name: HADRIEL NORTHUP MRN: [id number] Date of Birth: [birth year]

## 2020-05-14 ENCOUNTER — Ambulatory Visit: Payer: Self-pay

## 2020-05-14 ENCOUNTER — Ambulatory Visit: Payer: Self-pay | Admitting: Occupational Therapy

## 2020-05-15 ENCOUNTER — Telehealth (INDEPENDENT_AMBULATORY_CARE_PROVIDER_SITE_OTHER): Payer: Self-pay | Admitting: Primary Care

## 2020-05-15 ENCOUNTER — Encounter (INDEPENDENT_AMBULATORY_CARE_PROVIDER_SITE_OTHER): Payer: Self-pay | Admitting: Primary Care

## 2020-05-15 ENCOUNTER — Other Ambulatory Visit: Payer: Self-pay

## 2020-05-15 DIAGNOSIS — E119 Type 2 diabetes mellitus without complications: Secondary | ICD-10-CM

## 2020-05-15 DIAGNOSIS — I10 Essential (primary) hypertension: Secondary | ICD-10-CM

## 2020-05-15 MED ORDER — METFORMIN HCL 1000 MG PO TABS: 1000.0000 mg | ORAL_TABLET | Freq: Two times a day (BID) | ORAL | 3 refills | Status: DC

## 2020-05-15 MED ORDER — SITAGLIPTIN PHOSPHATE 50 MG PO TABS: 50.0000 mg | ORAL_TABLET | Freq: Every day | ORAL | 1 refills | Status: DC

## 2020-05-15 NOTE — Progress Notes (Signed)
I connected with  Brent Young on 05/15/20 by a video enabled telemedicine application and verified that I am speaking with the correct person using two identifiers.   I discussed the limitations of evaluation and management by telemedicine. The patient expressed understanding and agreed to proceed.     Pt states he takes his medication daily  Unable to check today he is in [place]  He has taken medication today  Pt does not wasn't combo pill for DM wants metformin

## 2020-05-15 NOTE — Progress Notes (Signed)
Telephone Note  I connected with [name] on 05/15/20 at 10:30 AM EST by telephone and verified that I am speaking with the correct person using two identifiers.  Location: Patient: Brent Young  Provider: Grayce Sessions @[facility]    I discussed the limitations, risks, security and privacy concerns of performing an evaluation and management service by telephone and the availability of in person appointments. I also discussed with the patient that there may be a patient responsible charge related to this service. The patient expressed understanding and agreed to proceed.   History of Present Illness: Brent Young is a 60 year old male having a visit for Bp not taking Bp at home he has a monitor but he also has therapy PT/OT/ST  weekly and they take his Bp at visit. He admits not taking 67 would not pay for it. Change to 2 separate medications. Patient is to leave a message if issues with Januvia    Observations/Objective: There were no vitals taken for this visit.  Pertinent negative and positive noted in HPI   Assessment and Plan: Diagnoses and all orders for this visit:  Type 2 diabetes mellitus without complication, without long-term current use of insulin (HCC) Medication change due to insurance did not want to pay for combo , will try sending in separate. Monitor foods that are high in carbohydrates are the following rice, potatoes, breads, sugars, and pastas.  Reduction in the intake (eating) will assist in lowering your blood sugars. -     metFORMIN (GLUCOPHAGE) 1000 MG tablet; Take 1 tablet (1,000 mg total) by mouth 2 (two) times daily with a meal. -     sitaGLIPtin (JANUVIA) 50 MG tablet; Take 1 tablet (50 mg total) by mouth daily.  Essential hypertension States no reading over 130/90 when therapist checks weekly feels medication is work and monitoring low-sodium, DASH diet, medication compliance, 150 minutes of moderate intensity exercise per  week. Discussed medication compliance, adverse effects.   Follow Up Instructions:    I discussed the assessment and treatment plan with the patient. The patient was provided an opportunity to ask questions and all were answered. The patient agreed with the plan and demonstrated an understanding of the instructions.   The patient was advised to call back or seek an in-person evaluation if the symptoms worsen or if the condition fails to improve as anticipated.  I provided 20  minutes of non-face-to-face time during this encounter.   [employer], NP

## 2020-05-16 ENCOUNTER — Ambulatory Visit: Payer: Self-pay | Admitting: Occupational Therapy

## 2020-05-16 ENCOUNTER — Ambulatory Visit: Payer: Self-pay

## 2020-05-16 ENCOUNTER — Ambulatory Visit (INDEPENDENT_AMBULATORY_CARE_PROVIDER_SITE_OTHER): Payer: Self-pay | Admitting: *Deleted

## 2020-05-16 ENCOUNTER — Telehealth (INDEPENDENT_AMBULATORY_CARE_PROVIDER_SITE_OTHER): Payer: Self-pay | Admitting: Primary Care

## 2020-05-16 NOTE — Telephone Encounter (Signed)
Sent to PCP ?

## 2020-05-16 NOTE — Telephone Encounter (Signed)
Pt called stating his BP was 141/84 on 05/15/20 at 1330 and 119/90 on 05/16/20 at 1330; these readings were obtained at [facility]; they were taken on his left upper arm; the pt says he was recently released from rehab and resumed his meds 3 weeks ago: Metoprolol XL 100 mg (daily), Cozaar 100 mg (daily), HCTZ 25 mg (daily), Amlodipine 10 mg daily (evening),  Furosemide 40 mg daily (evening) The pt says he has "vertigo" x 6 months; he had multiple falls with the last one being last week;he also has dizziness the pt says he does not have dizziness when he is sitting (room 3-4 months ago; dizziness was last week); the pt says he is in currently not having vertigo or dizziness; recommendations made per nurse triage protocol; he would like an office visit after 05/21/20; decision tree completed; pt offered and accepted appt with Gwinda Passe. [facility], 05/28/20 at 1030; pt aso placed on wait list for a sooner appt; will route to office for notification of encounter; he can be contacted at [phone].   Reason for Disposition . Dizziness is a chronic symptom (recurrent or ongoing AND present > 4 weeks)  Answer Assessment - Initial Assessment Questions 1. DESCRIPTION: "Describe your dizziness."     Light headed and vertigo 2. LIGHTHEADED: "Do you feel lightheaded?" (e.g., somewhat faint, woozy, weak upon standing)    Light headed last week; vertigo 3- 83months ao 3. VERTIGO: "Do you feel like either you or the room is spinning or tilting?" (i.e. vertigo)   4. SEVERITY: "How bad is it?"  "Do you feel like you are going to faint?" "Can you stand and walk?"   - MILD: Feels slightly dizzy, but walking normally.   - MODERATE: Feels very unsteady when walking, but not falling; interferes with normal activities (e.g., school, work) .   - SEVERE: Unable to walk without falling, or requires assistance to walk without falling; feels like passing out now.       5. ONSET:  "When did the dizziness begin?"      6. AGGRAVATING  FACTORS: "Does anything make it worse?" (e.g., standing, change in head position)    7. HEART RATE: "Can you tell me your heart rate?" "How many beats in 15 seconds?"  (Note: not all patients can do this)       8. CAUSE: "What do you think is causing the dizziness?"      9. RECURRENT SYMPTOM: "Have you had dizziness before?" If Yes, ask: "When was the last time?" "What happened that time?"     Ongoing x 3-6 months 10. OTHER SYMPTOMS: "Do you have any other symptoms?" (e.g., fever, chest pain, vomiting, diarrhea, bleeding)       no 11. PREGNANCY: "Is there any chance you are pregnant?" "When was your last menstrual period?"     n/a  Protocols used: DIZZINESS [facility]

## 2020-05-16 NOTE — Telephone Encounter (Signed)
Pt called to speak with Marcelino Duster about his RX for sitaGLIPtin (JANUVIA) 50 MG tablet  Being $300 a month copay / pt states this is too expensive and he didn't pick that medication up /he asked for another option/ please advise    Pt reported his BP as 2.23.22 -  BP of 141/84 2.24.22 - BP of 119/90 and experiencing dizziness and vertigo

## 2020-05-17 ENCOUNTER — Encounter (INDEPENDENT_AMBULATORY_CARE_PROVIDER_SITE_OTHER): Payer: Self-pay | Admitting: Primary Care

## 2020-05-17 NOTE — Telephone Encounter (Signed)
Sent to PCP ?

## 2020-05-17 NOTE — Progress Notes (Signed)
Called patient did not answer phone. Bp reading is normal the dizziness if from body adjusting from elevation to normal. If continues will re-evaluate Bp meds. If unable to afford [place]. Check insurance for what  Medication is covered

## 2020-05-23 ENCOUNTER — Ambulatory Visit: Payer: Self-pay | Admitting: Physical Therapy

## 2020-05-23 ENCOUNTER — Ambulatory Visit: Payer: Self-pay | Admitting: Occupational Therapy

## 2020-05-28 ENCOUNTER — Encounter (INDEPENDENT_AMBULATORY_CARE_PROVIDER_SITE_OTHER): Payer: Self-pay | Admitting: Primary Care

## 2020-05-28 ENCOUNTER — Other Ambulatory Visit: Payer: Self-pay

## 2020-05-28 ENCOUNTER — Ambulatory Visit (INDEPENDENT_AMBULATORY_CARE_PROVIDER_SITE_OTHER): Payer: Self-pay | Admitting: Primary Care

## 2020-05-28 VITALS — BP 127/84 | HR 116 | Temp 97.3°F | Ht 73.0 in | Wt 271.2 lb

## 2020-05-28 DIAGNOSIS — I10 Essential (primary) hypertension: Secondary | ICD-10-CM

## 2020-05-28 DIAGNOSIS — E0821 Diabetes mellitus due to underlying condition with diabetic nephropathy: Secondary | ICD-10-CM

## 2020-05-28 DIAGNOSIS — Z760 Encounter for issue of repeat prescription: Secondary | ICD-10-CM

## 2020-05-28 DIAGNOSIS — E119 Type 2 diabetes mellitus without complications: Secondary | ICD-10-CM

## 2020-05-28 DIAGNOSIS — E612 Magnesium deficiency: Secondary | ICD-10-CM

## 2020-05-28 MED ORDER — GABAPENTIN 300 MG PO CAPS: 300.0000 mg | ORAL_CAPSULE | Freq: Three times a day (TID) | ORAL | 1 refills | Status: DC

## 2020-05-28 MED ORDER — HYDROCHLOROTHIAZIDE 25 MG PO TABS: 25.0000 mg | ORAL_TABLET | Freq: Every day | ORAL | 1 refills | Status: DC

## 2020-05-28 MED ORDER — AMLODIPINE BESYLATE 10 MG PO TABS: 10.0000 mg | ORAL_TABLET | Freq: Every day | ORAL | 1 refills | Status: DC

## 2020-05-28 MED ORDER — LOSARTAN POTASSIUM 25 MG PO TABS: 25.0000 mg | ORAL_TABLET | Freq: Every day | ORAL | 1 refills | Status: DC

## 2020-05-28 MED ORDER — METFORMIN HCL 1000 MG PO TABS: 1000.0000 mg | ORAL_TABLET | Freq: Two times a day (BID) | ORAL | 3 refills | Status: DC

## 2020-05-28 MED ORDER — ATORVASTATIN CALCIUM 10 MG PO TABS: 10.0000 mg | ORAL_TABLET | Freq: Every day | ORAL | 3 refills | Status: DC

## 2020-05-28 NOTE — Progress Notes (Signed)
Established Patient Office Visit  Subjective:  Patient ID: Brent Young, male    DOB: [birth year]  Age: 60 y.o. MRN: [id number]  CC:  Chief Complaint  Patient presents with   Blood Pressure Check   Dizziness    HPI Mr.Brent Young  Is a 60 year old male who presents for hypertension management and review medications. Patient is confused on what he is on. He has several duplicates of the same medication and forgets what he has taken and retakes meds. All the same meds were combined in the pill bottle informed do not pick up any medications until he can see the bottom of the bill bottle. The medication that were discontinued was taken to [facility] for disposal. Denies shortness of breath, headaches, chest pain or lower extremity edema, sudden onset, vision changes, unilateral weakness, dizziness, paresthesias  Past Medical History:  Diagnosis Date   Allergy    Arthritis    Diabetes mellitus without complication ([place])    Hypertension     Past Surgical History:  Procedure Laterality Date   FRACTURE SURGERY Left    Patient fractured left arm    Family History  Problem Relation Age of Onset   Colon cancer Maternal Grandmother    Esophageal cancer Neg Hx    Stomach cancer Neg Hx    Rectal cancer Neg Hx     Social History   Socioeconomic History   Marital status: Married    Spouse name: Not on file   Number of children: Not on file   Years of education: Not on file   Highest education level: Not on file  Occupational History   Not on file  Tobacco Use   Smoking status: Never Smoker   Smokeless tobacco: Never Used  Vaping Use   Vaping Use: Never used  Substance and Sexual Activity   Alcohol use: Yes    Comment: occasionally   Drug use: No   Sexual activity: Not on file  Other Topics Concern   Not on file  Social History Narrative   Not on file   Social Determinants of Health   Financial Resource Strain: Not on file  Food Insecurity:  Not on file  Transportation Needs: Not on file  Physical Activity: Not on file  Stress: Not on file  Social Connections: Not on file  Intimate Partner Violence: Not on file    Outpatient Medications Prior to Visit  Medication Sig Dispense Refill   aspirin EC 81 MG tablet Take 81 mg by mouth daily. Swallow whole.     glimepiride (AMARYL) 4 MG tablet Take 1 tablet (4 mg total) by mouth every morning. 90 tablet 1   Oxcarbazepine (TRILEPTAL) 300 MG tablet Take 1 tablet (300 mg total) by mouth 3 (three) times daily. 270 tablet 1   sitaGLIPtin (JANUVIA) 50 MG tablet Take 1 tablet (50 mg total) by mouth daily. 90 tablet 1   amLODipine (NORVASC) 10 MG tablet Take 1 tablet (10 mg total) by mouth daily. 90 tablet 1   atorvastatin (LIPITOR) 10 MG tablet Take 1 tablet (10 mg total) by mouth daily. 90 tablet 3   clopidogrel (PLAVIX) 75 MG tablet Take 75 mg by mouth daily.     furosemide (LASIX) 40 MG tablet Take 40 mg by mouth at bedtime.     gabapentin (NEURONTIN) 100 MG capsule Take 1 capsule (100 mg total) by mouth 3 (three) times daily. 90 capsule 3   hydrochlorothiazide (HYDRODIURIL) 25 MG tablet Take 1 tablet (  25 mg total) by mouth daily. 90 tablet 1   KLOR-CON M10 10 MEQ tablet Take 10 mEq by mouth daily.     losartan (COZAAR) 25 MG tablet Take 1 tablet (25 mg total) by mouth daily. 90 tablet 1   magnesium oxide (MAG-OX) 400 MG tablet Take 400 mg by mouth daily.     metFORMIN (GLUCOPHAGE) 1000 MG tablet Take 1 tablet (1,000 mg total) by mouth 2 (two) times daily with a meal. 180 tablet 3   metoprolol succinate (TOPROL-XL) 100 MG 24 hr tablet Take 100 mg by mouth daily.     oxyCODONE (OXY IR/ROXICODONE) 5 MG immediate release tablet SMARTSIG:1 By Mouth 4-5 Times Daily     potassium chloride (KLOR-CON) 10 MEQ tablet Take 10 mEq by mouth 2 (two) times daily.     0.9 %  sodium chloride infusion      No facility-administered medications prior to visit.    No Known  Allergies  ROS Review of Systems Pertinent positive and negative noted in HPI    Objective:     Wt Readings from Last 3 Encounters:  05/28/20 271 lb 3.2 oz (123 kg)  04/16/20 265 lb 12.8 oz (120.6 kg)  01/09/20 288 lb (130.6 kg)   Physical Exam General: Vital signs reviewed.  Patient is well-developed and well-nourished, obese male, in no acute distress and cooperative with exam.  Head: Normocephalic and atraumatic. Eyes: EOMI, conjunctivae normal  Neck: Supple, trachea midline, normal ROM, no JVD.  Cardiovascular: RRR, S1 normal, S2 normal, no murmurs, gallops, or rubs. Pulmonary/Chest: Clear to auscultation bilaterally, no wheezes, rales, or rhonchi. Abdominal: Soft, non-tender, non-distended, BS + Musculoskeletal: No joint deformities, erythema, or stiffness, ROM full and nontender. Extremities: No lower extremity edema bilaterally Neurological: A&O x3, Strength is normal and symmetric bilaterally Skin: Warm, dry and intact. No rashes or erythema. Psychiatric: Normal mood and affect. speech and behavior is normal. Cognition and memory decline  Health Maintenance Due  Topic Date Due   COLONOSCOPY (Pts 45-70yr Insurance coverage will need to be confirmed)  Never done   COVID-19 Vaccine (2 - Moderna 3-dose series) 04/07/2020    There are no preventive care reminders to display for this patient.  No results found for: TSH Lab Results  Component Value Date   WBC 7.5 04/16/2020   HGB 16.9 04/16/2020   HCT 51.5 (H) 04/16/2020   MCV 94 04/16/2020   PLT 239 04/16/2020   Lab Results  Component Value Date   NA 136 04/16/2020   K 3.9 04/16/2020   CO2 23 04/16/2020   GLUCOSE 331 (H) 04/16/2020   BUN 10 04/16/2020   CREATININE 0.80 04/16/2020   BILITOT 1.4 (H) 04/16/2020   ALKPHOS 176 (H) 04/16/2020   AST 30 04/16/2020   ALT 26 04/16/2020   PROT 8.6 (H) 04/16/2020   ALBUMIN 4.7 04/16/2020   CALCIUM 9.7 04/16/2020   ANIONGAP 11 03/17/2017   Lab Results   Component Value Date   CHOL 181 04/16/2020   Lab Results  Component Value Date   HDL 40 04/16/2020   Lab Results  Component Value Date   LDLCALC 115 (H) 04/16/2020   Lab Results  Component Value Date   TRIG 147 04/16/2020   Lab Results  Component Value Date   CHOLHDL 4.5 04/16/2020   Lab Results  Component Value Date   HGBA1C 10.8 (A) 04/16/2020      Assessment & Plan:  [place] seen today for blood pressure check and dizziness.  Diagnoses and all orders for this visit:  Essential hypertension Blood pressure well controlled on tripple therapy met guidelines <130/80. Continue to follow  low-sodium, DASH diet, medication compliance, 150 minutes of moderate intensity exercise per week. Discussed medication compliance, adverse effects. -     CMP14+EGFR; Future  Medication refill -     hydrochlorothiazide (HYDRODIURIL) 25 MG tablet; Take 1 tablet (25 mg total) by mouth daily. -     losartan (COZAAR) 25 MG tablet; Take 1 tablet (25 mg total) by mouth daily. -     amLODipine (NORVASC) 10 MG tablet; Take 1 tablet (10 mg total) by mouth daily.  Type 2 diabetes mellitus without complication, without long-term current use of insulin (HCC)  Goal of therapy: Less than 6.5 hemoglobin A1c.  10.8 uncontrolled  On oral medications  Adjusted previous visit. Follow up he needs to be on insulin to decrease diabetes complications . Monitor foods that are high in carbohydrates are the following rice, potatoes, breads, sugars, and pastas.  Reduction in the intake (eating) will assist in lowering your blood sugars. -     gabapentin (NEURONTIN) 300 MG capsule; Take 1 capsule (300 mg total) by mouth 3 (three) times daily. -     atorvastatin (LIPITOR) 10 MG tablet; Take 1 tablet (10 mg total) by mouth daily. -     metFORMIN (GLUCOPHAGE) 1000 MG tablet; Take 1 tablet (1,000 mg total) by mouth 2 (two) times daily with a meal.  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  Magnesium deficiency -     Magnesium; Future     Meds ordered this encounter  Medications   hydrochlorothiazide (HYDRODIURIL) 25 MG tablet    Sig: Take 1 tablet (25 mg total) by mouth daily.    Dispense:  90 tablet    Refill:  1   losartan (COZAAR) 25 MG tablet    Sig: Take 1 tablet (25 mg total) by mouth daily.    Dispense:  90 tablet    Refill:  1   gabapentin (NEURONTIN) 300 MG capsule    Sig: Take 1 capsule (300 mg total) by mouth 3 (three) times daily.    Dispense:  90 capsule    Refill:  1   amLODipine (NORVASC) 10 MG tablet    Sig: Take 1 tablet (10 mg total) by mouth daily.    Dispense:  90 tablet    Refill:  1   atorvastatin (LIPITOR) 10 MG tablet    Sig: Take 1 tablet (10 mg total) by mouth daily.    Dispense:  90 tablet    Refill:  3   metFORMIN (GLUCOPHAGE) 1000 MG tablet    Sig: Take 1 tablet (1,000 mg total) by mouth 2 (two) times daily with a meal.    Dispense:  180 tablet    Refill:  3    Follow-up: Return in about 1 month (around 06/28/2020) for DM/ A1C/ .    Kerin Perna, NP

## 2020-05-29 ENCOUNTER — Ambulatory Visit: Payer: Self-pay | Admitting: Occupational Therapy

## 2020-05-29 ENCOUNTER — Ambulatory Visit: Payer: Self-pay | Attending: Physical Medicine and Rehabilitation

## 2020-05-29 ENCOUNTER — Encounter: Payer: Self-pay | Admitting: Occupational Therapy

## 2020-05-29 VITALS — BP 118/74 | HR 118

## 2020-05-29 DIAGNOSIS — R1313 Dysphagia, pharyngeal phase: Secondary | ICD-10-CM | POA: Insufficient documentation

## 2020-05-29 DIAGNOSIS — M25611 Stiffness of right shoulder, not elsewhere classified: Secondary | ICD-10-CM | POA: Insufficient documentation

## 2020-05-29 DIAGNOSIS — R278 Other lack of coordination: Secondary | ICD-10-CM | POA: Insufficient documentation

## 2020-05-29 DIAGNOSIS — I69851 Hemiplegia and hemiparesis following other cerebrovascular disease affecting right dominant side: Secondary | ICD-10-CM | POA: Insufficient documentation

## 2020-05-29 DIAGNOSIS — R2681 Unsteadiness on feet: Secondary | ICD-10-CM | POA: Insufficient documentation

## 2020-05-29 DIAGNOSIS — R471 Dysarthria and anarthria: Secondary | ICD-10-CM | POA: Insufficient documentation

## 2020-05-29 DIAGNOSIS — M25511 Pain in right shoulder: Secondary | ICD-10-CM

## 2020-05-29 DIAGNOSIS — M6281 Muscle weakness (generalized): Secondary | ICD-10-CM

## 2020-05-29 DIAGNOSIS — R262 Difficulty in walking, not elsewhere classified: Secondary | ICD-10-CM | POA: Insufficient documentation

## 2020-05-29 DIAGNOSIS — R2689 Other abnormalities of gait and mobility: Secondary | ICD-10-CM | POA: Insufficient documentation

## 2020-05-29 NOTE — Therapy (Signed)
[facility] [address] [place], [place], [address] Phone: [phone]   Fax:  [phone]  Occupational Therapy Treatment  Patient Details  Name: Brent Young MRN: [id number] Date of Birth: July 16, 1960 No data recorded  Encounter Date: 05/29/2020   OT End of Session - 05/29/20 1102    Visit Number 4    Number of Visits 13    Date for OT Re-Evaluation 05/21/20    Authorization Type self pay    OT Start Time 1102    OT Stop Time 1145    OT Time Calculation (min) 43 min    Activity Tolerance Patient tolerated treatment well    Behavior During Therapy [facility] for tasks assessed/performed           Past Medical History:  Diagnosis Date  . Allergy   . Arthritis   . Diabetes mellitus without complication (HCC)   . Hypertension     Past Surgical History:  Procedure Laterality Date  . FRACTURE SURGERY Left    Patient fractured left arm    There were no vitals filed for this visit.   Subjective Assessment - 05/29/20 1107    Subjective  "just my shoulder a little bit"    Pertinent History PMH: CVA, HTN, RUE fx humerus (12/2019), Covid Dec 2020, Peripheral Neuropathy, CHF, cirrhosis of liver    Limitations No driving. 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 Yes    Pain Score 3     Pain Location Shoulder    Pain Orientation Right    Pain Descriptors / Indicators Aching    Pain Type Acute pain    Pain Onset More than a month ago    Pain Frequency Intermittent    Aggravating Factors  sleeping/malpositioning    Pain Relieving Factors rest                        OT Treatments/Exercises (OP) - 05/29/20 1316      ADLs   Toileting education provided on timed void schedules for addressing difficulty with incontinence and making it to the bathroom. pt at increased risk for falls d/t rushing  to the bathroom.    Medication Management assisted and educated patient on medication management and identifying what medications are for and possible side effects for managing symptoms (i.e. frequent urination)      Shoulder Exercises: Seated   Other Seated Exercises isometric shoulder with unweighted cane for shoulder flexion, extension and chest press      Shoulder Exercises: Standing   Other Standing Exercises wall push ups x 5 with SBA for stability. Pt did isometric holds of ball on wall x 5. wall raises with ball x 10                    OT Short Term Goals - 05/29/20 1110      OT SHORT TERM GOAL #1   Title Pt will be independent with HEP 04/23/20    Time 4    Period Weeks    Status On-going    Target Date 04/23/20      OT SHORT TERM GOAL #2   Title Pt will perform LB dressing (with socks and shoe) with supervision    Baseline min A    Time 4    Period  Weeks    Status Achieved      OT SHORT TERM GOAL #3   Title Pt will verbalize understanding of memory compensatory strategies    Time 4    Period Weeks    Status Achieved      OT SHORT TERM GOAL #4   Title Pt will demonstrate improved shoulder flexion of at least 120 degrees    Baseline 110 RUE    Time 4    Period Weeks    Status Achieved   120* with RUe     OT SHORT TERM GOAL #5   Title Pt will perform environmental scanning in a moderately distracting environment with 75% accuracy.    Time 4    Period Weeks    Status New             OT Long Term Goals - 05/29/20 1116      OT LONG TERM GOAL #1   Title Pt will be independent with updated HEP 05/21/2020    Time 8    Period Weeks    Status New      OT LONG TERM GOAL #2   Title Pt will perform physical and cognitive task simultaneously with 95% accuracy    Time 8    Period Weeks    Status New      OT LONG TERM GOAL #3   Title Pt will complete mod complex cooking with supervision and good safety awareness    Time 8    Period Weeks    Status  New      OT LONG TERM GOAL #4   Title Pt will demonstrate improved shoulder flexion of at least 130 degrees to obtain object from high level shelf with pain equal to or less than 5/10    Baseline RUE 110    Time 8    Period Weeks    Status On-going   120* with RUE shoulder     OT LONG TERM GOAL #5   Title Pt will improve RUE range of motion and report greater ease and comfort for toilet hygiene.    Time 8    Period Weeks    Status On-going   still a struggle but getting easier     OT LONG TERM GOAL #6   Title Pt will complete all basic ADLs with mod I    Time 8    Period Weeks    Status Achieved   pt completing independently but takes increased time                Plan - 05/29/20 1429    Clinical Impression Statement Pt is progressing towards goals. Pt continues ot have some pain in RUE shoulder with strengthening so d/c strengthening with weights and moved to isometrics.    OT Occupational Profile and History Problem Focused Assessment - Including review of records relating to presenting problem    Occupational performance deficits (Please refer to evaluation for details): ADL's    Body Structure / Function / Physical Skills ADL;Balance;Decreased knowledge of precautions;Decreased knowledge of use of DME;IADL;GMC;Strength;Pain;UE functional use;ROM;Mobility;FMC    Cognitive Skills Thought;Understand;Problem Solve;Memory    Rehab Potential Good    Clinical Decision Making Limited treatment options, no task modification necessary    Comorbidities Affecting Occupational Performance: None    Modification or Assistance to Complete Evaluation  No modification of tasks or assist necessary to complete eval    OT Frequency --   1x/week for 4 weeks and  2x/week for 4 weeks   OT Duration 8 weeks   1x/week for 4 weeks then 2x/week for 4 weeks or total of 12 visits over extended period d/t scheduling difficulties   OT Treatment/Interventions Self-care/ADL training;Cryotherapy;Aquatic  Therapy;Moist Heat;Electrical Stimulation;Ultrasound;Fluidtherapy;Paraffin;Therapeutic exercise;Neuromuscular education;DME and/or AE instruction;Manual Therapy;Functional Mobility Training;Passive range of motion;Cognitive remediation/compensation;Therapeutic activities;Patient/family education;Energy conservation    Plan supine shoulder HEP, light strengthening of RUE    Consulted and Agree with Plan of Care Patient           Patient will benefit from skilled therapeutic intervention in order to improve the following deficits and impairments:   Body Structure / Function / Physical Skills: ADL,Balance,Decreased knowledge of precautions,Decreased knowledge of use of DME,IADL,GMC,Strength,Pain,UE functional use,ROM,Mobility,FMC Cognitive Skills: Thought,Understand,Problem Solve,Memory     Visit Diagnosis: Muscle weakness (generalized)  Other lack of coordination  Stiffness of right shoulder, not elsewhere classified  Acute pain of right shoulder  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   Junious Dresser MOT, OTR/L  05/29/2020, 2:34 PM  [place] [facility] [address] [place], [place], [address] Phone: [phone]   Fax:  [phone]  Name: Brent Young MRN: [id number] Date of Birth: November 20, 1960

## 2020-05-29 NOTE — Therapy (Signed)
[facility] [address] [place], [place], [address] Phone: [phone]   Fax:  [phone]  Physical Therapy Treatment  Patient Details  Name: Brent Young MRN: [id number] Date of Birth: 13-Feb-1961 Referring Provider (PT): Leandro Reasoner, MD.   Encounter Date: 05/29/2020   PT End of Session - 05/29/20 1028    Visit Number 4    Number of Visits 13    Date for PT Re-Evaluation 06/24/20   POC for 8 weeks, Cert for 90 days   Authorization Type [employer]    PT Start Time 1025   patient arriving late   PT Stop Time 1100    PT Time Calculation (min) 35 min    Equipment Utilized During Treatment Gait belt    Activity Tolerance Patient tolerated treatment well    Behavior During Therapy WFL for tasks assessed/performed           Past Medical History:  Diagnosis Date  . Allergy   . Arthritis   . Diabetes mellitus without complication ([place])   . Hypertension     Past Surgical History:  Procedure Laterality Date  . FRACTURE SURGERY Left    Patient fractured left arm    Vitals:   05/29/20 1029 05/29/20 1057  BP: (!) 156/88 118/74  Pulse: (!) 101 (!) 118     Subjective Assessment - 05/29/20 1031    Subjective Patient reports had MD appointment yesterday, and it went well. No new changes. BP has been a little better, did take medications this morning.    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 No/denies    Pain Onset Yesterday                [facility] Adult PT Treatment/Exercise - 05/29/20 0001      Transfers   Transfers Sit to Stand;Stand to Sit    Sit to Stand 5: Supervision    Five time sit to stand comments  16.50 secs with UE support from mat at standard chair height.    Stand to Sit 5:  Supervision      Ambulation/Gait   Ambulation/Gait Yes    Ambulation/Gait Assistance 5: Supervision    Ambulation/Gait Assistance Details ambulation around therapy gym x 115 ft with supervisoin. PT continue to provide verbal cues for improved and equal step length bilaterally.    Ambulation Distance (Feet) 115 Feet    Assistive device None    Gait Pattern Step-through pattern    Ambulation Surface Level;Indoor    Stairs Yes    Stairs Assistance 5: Supervision;4: Min guard    Stairs Assistance Details (indicate cue type and reason) completed initially ascending with single rail and step to pattern x 8 steps, second set completed alternating pattern x 8 steps, increased balance challenge noted with reciprocal.  HR after completion: 118 bpm.    Stair Management Technique One rail Right;Step to pattern;Alternating pattern;Forwards    Number of Stairs 16    Height of Stairs 6               Balance Exercises - 05/29/20 0001      Balance Exercises: Standing   Standing Eyes Opened Narrow base of support (BOS);Foam/compliant surface;Limitations    Standing Eyes Opened Limitations completed on airex:  completed horizontal/vertical head turns x 10 reps each. increased challenge with R horizontal > L horixontal.    Standing Eyes Closed Wide (BOA);Foam/compliant surface;Limitations    Standing Eyes Closed Limitations standing with wide BOS on foam surface . completed standing eyes closed 3 x 30 seconds. increased sway noted with vision removed.    SLS with Vectors Solid surface;Intermittent upper extremity assist;Limitations    SLS with Vectors Limitations completed standing on firm surface completing alternating toe taps to cones x 10 reps. intermittent touch A to chair and CGA from PT for balance. Completed x 2 set with working toward improved control with stance on LLE.             PT Education - 05/29/20 1057    Education Details progress toward STG. continue to monitor HR/BP     Person(s) Educated Patient    Methods Explanation    Comprehension Verbalized understanding            PT Short Term Goals - 05/29/20 1031      PT SHORT TERM GOAL #1   Title Patient will be independent with initial balance/strengthening HEP (all STGs Due: 05/21/20)    Baseline reports independence with exercises not completing daily    Time 4    Period Weeks    Status Partially Met    Target Date 05/21/20      PT SHORT TERM GOAL #2   Title Patient will improve 5x sit <> stand to </= 25 seconds to demonstrate improved mobility and balance.    Baseline 31.07 secs w/ UE; 16.50 secs w/ UE    Time 4    Period Weeks    Status Achieved      PT SHORT TERM GOAL #3   Title Patient will improve Berg Balance to >/= 42/56 to demonstrate improved balance and reduced risk for falls    Baseline 38/56; deferred as assesed only two visits ago    Time 4    Period Weeks    Status Deferred      PT SHORT TERM GOAL #4   Title Patient will demonstrate ability to ascend/descend 12 stairs with SPC vs. rail with supervision for improved household mobility    Baseline L Rail CGA x 4 stairs; Supervisoin/CGA for 16 stairs with single rail    Time 4    Period Weeks    Status Partially Met             PT Long Term Goals - 04/30/20 1106      PT LONG TERM GOAL #1   Title Patient will be independent with Final HEP for strengthening/balance (All LTGs Due: 06/11/20)    Baseline no HEP established    Time 8    Period Weeks    Status New    Target Date 06/11/20      PT LONG TERM GOAL #2   Title Patient will improve Berg Balance to >/= 45/56 to demonstrate improved balance and reduced risk for falls    Baseline 38/56    Time 8    Period Weeks    Status New      PT LONG TERM GOAL #3   Title Patient will improve TUG to </= 12 seconds with LRAD to demonstrate reduced fall risk    Baseline 14.81 secs    Time 8    Period Weeks    Status New      PT LONG TERM GOAL #4   Title Patient will improve  5x sit <> stand to </= 20 seconds to demonstrate improved mobility and reduced fall risk    Baseline 31.07 secs    Time 8    Period Weeks    Status New      PT LONG TERM GOAL #5   Title Patient will improve gait speed to >/= 3.0 ft/sec to demonstrate improved community mobility    Baseline 2.78 ft/sec    Time 8    Period Weeks    Status New      PT LONG TERM GOAL #6   Title Patient will improve FOTO to >/= 72% upon discharge    Baseline 58%    Time 8    Period Weeks    Status New                 Plan - 05/29/20 1204    Clinical Impression Statement Patient arriving late for session, so limited today. BP readings were better today, however HR continues to be elevated with physical activity. Assessed progress toward STG with patient able to meet STG #2, and partially meet STG #1 and 4. Paitent improved 5x sit <> stand to 16.50 seconds with UE support. Paitent demonstrating increased challenge with complaint surface and SLS today. Will continue to progress toward all LTGs.    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, obesity    Examination-Activity Limitations Bathing;Dressing;Locomotion Level;Stand;Stairs    Examination-Participation Restrictions Occupation;Driving;Community Activity    Stability/Clinical Decision Making Evolving/Moderate complexity    Rehab Potential Good    PT Frequency 1x / week    PT Duration 4 weeks   initially due to transportation, followed by 2x/week for 4 weeks   PT Treatment/Interventions ADLs/Self Care Home Management;Cryotherapy;Electrical Stimulation;Moist Heat;DME Instruction;Gait training;Stair training;Functional mobility training;Therapeutic activities;Therapeutic exercise;Balance training;Neuromuscular re-education;Patient/family education;Orthotic  Fit/Training;Manual techniques;Vestibular;Passive range of motion    PT Next Visit Plan Continue to progress HEP as needed. Continue balance/strengthening. Work on activites to [employer]. Vestibular Screen due to patient reports of vertigo?    Consulted and Agree with Plan of Care Patient           Patient will benefit from skilled therapeutic intervention in order to improve the following deficits and impairments:  Abnormal gait,Decreased balance,Decreased endurance,Difficulty walking,Impaired sensation,Pain,Impaired UE functional use,Decreased strength,Decreased safety awareness,Decreased knowledge of use of DME,Decreased coordination,Decreased activity tolerance,Dizziness  Visit Diagnosis: Other abnormalities of gait and mobility  Unsteadiness on feet  Muscle weakness (generalized)  Difficulty in walking, not elsewhere classified     Problem List There are no problems to display for this patient.   Jones Bales, PT, DPT 05/29/2020, 12:08 PM  [facility] [address] [place], [place], [address] Phone: [phone]   Fax:  [phone]  Name: KOWEN KLUTH MRN: [id number] Date of Birth: April 01, 1960

## 2020-06-04 ENCOUNTER — Other Ambulatory Visit: Payer: Self-pay

## 2020-06-04 ENCOUNTER — Ambulatory Visit: Payer: Self-pay

## 2020-06-04 DIAGNOSIS — R471 Dysarthria and anarthria: Secondary | ICD-10-CM

## 2020-06-04 DIAGNOSIS — R1313 Dysphagia, pharyngeal phase: Secondary | ICD-10-CM

## 2020-06-04 NOTE — Therapy (Signed)
[facility] [address] [place], [place], [address] Phone: [phone]   Fax:  [phone]  Speech Language Pathology Treatment  Patient Details  Name: Brent Young MRN: [id number] Date of Birth: 1960/06/13 Referring Provider (SLP): Alison Murray, MD   Encounter Date: 06/04/2020   End of Session - 06/04/20 1701    Visit Number 3    Number of Visits 17    Date for SLP Re-Evaluation 07/26/20    SLP Start Time 1319    SLP Stop Time  1402    SLP Time Calculation (min) 43 min    Activity Tolerance Patient tolerated treatment well           Past Medical History:  Diagnosis Date  . Allergy   . Arthritis   . Diabetes mellitus without complication ([facility])   . Hypertension     Past Surgical History:  Procedure Laterality Date  . FRACTURE SURGERY Left    Patient fractured left arm    There were no vitals filed for this visit.   Subjective Assessment - 06/04/20 1324    Subjective Pt reports he is unsure how to do [place].    Currently in Pain? No/denies                 ADULT SLP TREATMENT - 06/04/20 1325      General Information   Behavior/Cognition Alert;Cooperative;Pleasant mood      Treatment Provided   Treatment provided Cognitive-Linquistic      Dysphagia Treatment   Treatment Methods Therapeutic exercise;Patient/caregiver education    Other treatment/comments Pt reports he hasn't done Clair Gulling since last ST due to unsure how to do it. SLP reviewed this with pt today and pt independent with Mendelsohn by end of session. Pt with questions to SLP about whether a fall can trigger a CVA - pt thinking of legal action re: a fall while on an escalator. SLP suggested pt ask referring MD or a neurologist this question.      Cognitive-Linquistic Treatment   Treatment focused on Dysarthria;Patient/family/caregiver education    Skilled Treatment Pt looking HEP updated for dysarthria for additional  exercises to target. SLP modeling provided for recommended labial and lingual exercises. Min A faded to modified independence for exaggerated articulator movement for oral stregthening. Pt voiced interest in going to once a week or once every other week due to his insurance situation.      Assessment / Recommendations / Plan   Plan Continue with current plan of care      Progression Toward Goals   Progression toward goals Progressing toward goals            SLP Education - 06/04/20 1701    Education Details [email or web address]    Person(s) Educated Patient    Methods Explanation;Demonstration;Verbal cues    Comprehension Verbalized understanding;Returned demonstration;Verbal cues required;Need further instruction            SLP Short Term Goals - 06/04/20 1324      SLP SHORT TERM GOAL #1   Title pt will maintain 100% intelligibility in 15 minutes mod complex/complex conversation with dysarthria compensations PRN over 3 visits    Time 3    Period Weeks    Status On-going      SLP SHORT TERM GOAL #2   Title pt will complete HEP for oral articulator strength with rare min A in 2 visits    Baseline 06-04-20    Time 3    Period  Weeks    Status On-going      SLP SHORT TERM GOAL #3   Title pt will demonstrate no overt s/sx aspiration during PO liquids during 3 therapy sessions    Time 3    Period Weeks    Status On-going            SLP Long Term Goals - 06/04/20 1706      SLP LONG TERM GOAL #1   Title pt will maintain 100% intelligibility in 15 minutes complex conversation with dysarthria compensations PRN over 3 visits    Time 7    Period Weeks   or 17 sessions, for all LTGs   Status On-going      SLP LONG TERM GOAL #2   Title pt will complete HEP for oral articulator strength with rare min A in 2 visits    Time 7    Period Weeks    Status On-going      SLP LONG TERM GOAL #3   Title pt will demo WNL procedure in dysphagia HEP over 2 sessions    Time 7    Period Weeks     Status On-going            Plan - 06/04/20 1704    Clinical Impression Statement Brent Young presents today with mild dysarthria and possibly mild dysphagia(reports coughing frequency has decr'd since MBS in late December but pt is still "careful" when he swallows); pt remained 100% intelligible in mod complex conversation today throughout session, however Brent Young demonstrated some oral weakness and incoordination when going through his dysarthria HEP, and will benefit from skilled ST continueing to target strengthening his articulators with an HEP, in order to improve speech clarity to PLOF. Training how to habitualize compensatory measures for his dysarthria will be helpful as well. Today, SLP educated and modeled Mendelsohn exercises for pharyngeal deficitsand pt modified independent at session end. Recommend skilled ST to maximize communication and swallow function.    Speech Therapy Frequency 2x / week    Duration 8 weeks    Treatment/Interventions Compensatory strategies;Cueing hierarchy;Functional tasks;Patient/family education;Pharyngeal strengthening exercises;SLP instruction and feedback    Potential to Achieve Goals Good    Potential Considerations Cooperation/participation level    SLP Home Exercise Plan updated HEP for dysarthria and dysphagia    Consulted and Agree with Plan of Care Patient           Patient will benefit from skilled therapeutic intervention in order to improve the following deficits and impairments:   Dysphagia, pharyngeal phase  Dysarthria and anarthria    Problem List There are no problems to display for this patient.   [facility] ,MS, CCC-SLP  06/04/2020, 5:07 PM  [facility] [address] [place], [place], [address] Phone: [phone]   Fax:  [phone]   Name: Brent Young MRN: [id number] Date of Birth: [birth year]

## 2020-06-06 ENCOUNTER — Ambulatory Visit: Payer: Self-pay | Admitting: Occupational Therapy

## 2020-06-06 ENCOUNTER — Other Ambulatory Visit: Payer: Self-pay

## 2020-06-06 ENCOUNTER — Encounter: Payer: Self-pay | Admitting: Occupational Therapy

## 2020-06-06 ENCOUNTER — Ambulatory Visit: Payer: Self-pay

## 2020-06-06 VITALS — BP 98/62 | HR 110

## 2020-06-06 VITALS — BP 109/67 | HR 114

## 2020-06-06 DIAGNOSIS — R262 Difficulty in walking, not elsewhere classified: Secondary | ICD-10-CM

## 2020-06-06 DIAGNOSIS — M25511 Pain in right shoulder: Secondary | ICD-10-CM

## 2020-06-06 DIAGNOSIS — R2681 Unsteadiness on feet: Secondary | ICD-10-CM

## 2020-06-06 DIAGNOSIS — R278 Other lack of coordination: Secondary | ICD-10-CM

## 2020-06-06 DIAGNOSIS — R2689 Other abnormalities of gait and mobility: Secondary | ICD-10-CM

## 2020-06-06 DIAGNOSIS — M25611 Stiffness of right shoulder, not elsewhere classified: Secondary | ICD-10-CM

## 2020-06-06 DIAGNOSIS — M6281 Muscle weakness (generalized): Secondary | ICD-10-CM

## 2020-06-06 NOTE — Therapy (Signed)
[facility] [address] [place], [place], [address] Phone: [phone]   Fax:  [phone]  Physical Therapy Treatment  Patient Details  Name: Brent Young MRN: [id number] Date of Birth: [birth year] Referring Provider (PT): Leandro Reasoner, MD.   Encounter Date: 06/06/2020   PT End of Session - 06/06/20 1316    Visit Number 5    Number of Visits 13    Date for PT Re-Evaluation 06/24/20   POC for 8 weeks, Cert for 90 days   Authorization Type [employer]    PT Start Time 1319   finishing up with OT   PT Stop Time 1400    PT Time Calculation (min) 41 min    Equipment Utilized During Treatment Gait belt    Activity Tolerance Patient tolerated treatment well    Behavior During Therapy [facility] for tasks assessed/performed           Past Medical History:  Diagnosis Date  . Allergy   . Arthritis   . Diabetes mellitus without complication ([place])   . Hypertension     Past Surgical History:  Procedure Laterality Date  . FRACTURE SURGERY Left    Patient fractured left arm    Vitals:   06/06/20 1324  BP: 98/62  Pulse: (!) 110     Subjective Assessment - 06/06/20 1317    Subjective Patient reports that he has been doing well. No falls. Patietn reports that he is having some dizziness intermittently. Reports it may be due to medications.    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 Yes    Pain Score 3     Pain Location Shoulder    Pain Orientation Right    Pain Descriptors / Indicators Aching    Pain Type Acute pain    Pain Onset More than a month ago            Vestibular Assessment - 06/06/20 0001      Symptom Behavior   Subjective history of current problem Patietn reports intermittent dizziness  that occurs after taking his medications. Difficult to describe but feels lightheaded at times. BP: 94/64 upon assessment today.      Oculomotor Exam   Oculomotor Alignment Normal    Ocular ROM WNL    Spontaneous Absent    Gaze-induced  Absent    Smooth Pursuits Intact    Saccades Poor trajectory;Slow      Oculomotor Exam-Fixation Suppressed    Left Head Impulse Normal    Right Head Impulse Normal      Vestibulo-Ocular Reflex   VOR 1 Head Only (x 1 viewing) Normal. No Dizziness             [facility] Adult PT Treatment/Exercise - 06/06/20 0001      Transfers   Transfers Sit to Stand;Stand to Sit    Sit to Stand 5: Supervision    Stand to Sit 5: Supervision      Ambulation/Gait   Ambulation/Gait Yes    Ambulation/Gait Assistance 5: Supervision    Ambulation/Gait Assistance Details ambulation throughout therapy session with activities    Assistive device None    Gait Pattern Step-through pattern    Ambulation Surface Level;Indoor      Exercises   Exercises Knee/Hip  Knee/Hip Exercises: Aerobic   Other Aerobic Completed SciFit on Level 3.0 with BLE only x 5 minutes for improved strengthening/endurance/activity tolerance.             Balance Exercises - 06/06/20 0001      Balance Exercises: Standing   Standing Eyes Opened Foam/compliant surface;Limitations;Head turns;Wide (BOA)    Standing Eyes Opened Limitations completed on airex: completed horizontal/vertical head turns x 10 reps each. increased challenge with vertical > horizontal.    Standing Eyes Closed Wide (BOA);Foam/compliant surface;3 reps;Time;Limitations    Standing Eyes Closed Time 20-25    Standing Eyes Closed Limitations standing with wide BOS on foam surface . completed standing eyes closed 3 x 20- 25 seconds. increased sway noted with vision removed.    SLS with Vectors Solid surface;Intermittent upper extremity assist;Limitations    SLS with Vectors Limitations completed standing on firm surface  completing alternating toe taps to 6" x 10 reps. intermittent UE support from // bars.    Rockerboard Anterior/posterior;EO;UE support;Limitations    Rockerboard Limitations standing on rockerboard positioned A/P with UE support completed weight shifts x 15 reps. verbal cues for technique.    Step Ups Forward;6 inch;Intermittent UE support;Limitations    Step Ups Limitations completed step ups with intermittent UE support, completed alternating step ups in // bars to 6" step. verbal cues for foot positioning intermittently.               PT Short Term Goals - 05/29/20 1031      PT SHORT TERM GOAL #1   Title Patient will be independent with initial balance/strengthening HEP (all STGs Due: 05/21/20)    Baseline reports independence with exercises not completing daily    Time 4    Period Weeks    Status Partially Met    Target Date 05/21/20      PT SHORT TERM GOAL #2   Title Patient will improve 5x sit <> stand to </= 25 seconds to demonstrate improved mobility and balance.    Baseline 31.07 secs w/ UE; 16.50 secs w/ UE    Time 4    Period Weeks    Status Achieved      PT SHORT TERM GOAL #3   Title Patient will improve Berg Balance to >/= 42/56 to demonstrate improved balance and reduced risk for falls    Baseline 38/56; deferred as assesed only two visits ago    Time 4    Period Weeks    Status Deferred      PT SHORT TERM GOAL #4   Title Patient will demonstrate ability to ascend/descend 12 stairs with SPC vs. rail with supervision for improved household mobility    Baseline L Rail CGA x 4 stairs; Supervisoin/CGA for 16 stairs with single rail    Time 4    Period Weeks    Status Partially Met             PT Long Term Goals - 04/30/20 1106      PT LONG TERM GOAL #1   Title Patient will be independent with Final HEP for strengthening/balance (All LTGs Due: 06/11/20)    Baseline no HEP established    Time 8    Period Weeks    Status New    Target Date 06/11/20       PT LONG TERM GOAL #2   Title Patient will improve Berg Balance to >/= 45/56 to demonstrate improved balance and reduced risk for falls    Baseline 38/56  Time 8    Period Weeks    Status New      PT LONG TERM GOAL #3   Title Patient will improve TUG to </= 12 seconds with LRAD to demonstrate reduced fall risk    Baseline 14.81 secs    Time 8    Period Weeks    Status New      PT LONG TERM GOAL #4   Title Patient will improve 5x sit <> stand to </= 20 seconds to demonstrate improved mobility and reduced fall risk    Baseline 31.07 secs    Time 8    Period Weeks    Status New      PT LONG TERM GOAL #5   Title Patient will improve gait speed to >/= 3.0 ft/sec to demonstrate improved community mobility    Baseline 2.78 ft/sec    Time 8    Period Weeks    Status New      PT LONG TERM GOAL #6   Title Patient will improve FOTO to >/= 72% upon discharge    Baseline 58%    Time 8    Period Weeks    Status New                 Plan - 06/06/20 1357    Clinical Impression Statement Today's session focused on further assesment of vestibular system due to patient reports of intermittent dizziness. Patient reporting that dizziness occurs after taking medications. BP lower today than normal (BP: 94/64) with patient reporting feelings of lightheadedness. PT also educating on central component of dizziness due to recent CVA.  Rest of session spent working on balance activities on complaint surfaces and reduced UE support. INtermittent rest breaks required due to fatigue. Will continue to progress toward all LTGs.    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, obesity    Examination-Activity Limitations Bathing;Dressing;Locomotion Level;Stand;Stairs    Examination-Participation Restrictions  Occupation;Driving;Community Activity    Stability/Clinical Decision Making Evolving/Moderate complexity    Rehab Potential Good    PT Frequency 1x / week    PT Duration 4 weeks   initially due to transportation, followed by 2x/week for 4 weeks   PT Treatment/Interventions ADLs/Self Care Home Management;Cryotherapy;Electrical Stimulation;Moist Heat;DME Instruction;Gait training;Stair training;Functional mobility training;Therapeutic activities;Therapeutic exercise;Balance training;Neuromuscular re-education;Patient/family education;Orthotic Fit/Training;Manual techniques;Vestibular;Passive range of motion    PT Next Visit Plan With monitoring BP before/after medications? How has it been? Continue to progress HEP as needed. Continue balance/strengthening. Work on activites to [employer]. How has BP been?    Consulted and Agree with Plan of Care Patient           Patient will benefit from skilled therapeutic intervention in order to improve the following deficits and impairments:  Abnormal gait,Decreased balance,Decreased endurance,Difficulty walking,Impaired sensation,Pain,Impaired UE functional use,Decreased strength,Decreased safety awareness,Decreased knowledge of use of DME,Decreased coordination,Decreased activity tolerance,Dizziness  Visit Diagnosis: Other abnormalities of gait and mobility  Difficulty in walking, not elsewhere classified  Muscle weakness (generalized)  Unsteadiness on feet     Problem List There are no problems to display for this patient.   Jones Bales, PT, DPT 06/06/2020, 2:05 PM  [facility] [address] [place], [place], [address] Phone: [phone]   Fax:  [phone]  Name: Brent Young MRN: [id number] Date of  Birth: 09/20/1960

## 2020-06-06 NOTE — Therapy (Signed)
[facility] [address] [place], [place], [address] Phone: [phone]   Fax:  [phone]  Occupational Therapy Treatment  Patient Details  Name: Brent Young MRN: [id number] Date of Birth: December 24, 1960 No data recorded  Encounter Date: 06/06/2020   OT End of Session - 06/06/20 1326    Visit Number 5    Number of Visits 13    Date for OT Re-Evaluation 05/21/20    Authorization Type self pay    OT Start Time 1236    OT Stop Time 1316    OT Time Calculation (min) 40 min    Activity Tolerance Patient tolerated treatment well    Behavior During Therapy [facility] for tasks assessed/performed           Past Medical History:  Diagnosis Date  . Allergy   . Arthritis   . Diabetes mellitus without complication (HCC)   . Hypertension     Past Surgical History:  Procedure Laterality Date  . FRACTURE SURGERY Left    Patient fractured left arm    Vitals:   06/06/20 1246  BP: 109/67  Pulse: (!) 114     Subjective Assessment - 06/06/20 1241    Subjective  Pt reports incr pain at end range shouder movement    Pertinent History PMH: CVA, HTN, RUE fx humerus (12/2019), Covid Dec 2020, Peripheral Neuropathy, CHF, cirrhosis of liver    Limitations No driving. 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 Yes    Pain Score 3    up to 6-8/10 with exercise   Pain Location Shoulder    Pain Orientation Right    Pain Onset More than a month ago    Pain Frequency Intermittent    Aggravating Factors  sleep/malpositioning, IR    Pain Relieving Factors rest           Supine, shoulder circumduction at 90* with 1lb wt without pain.    Arm bike x5 min level 4 for conditioning and reciprocal movement without rest, forward/backwards.        OT Education - 06/06/20 1325    Education Details Updated HEP (added  shoulder abduction and ER/IR with cane, modified flex and chest press to hold at handle with R hand with thumb up.  Added yellow theraband exercises).--see pt instructions    Person(s) Educated Patient    Methods Explanation;Demonstration;Verbal cues;Handout    Comprehension Verbalized understanding;Returned demonstration;Verbal cues required            OT Short Term Goals - 06/06/20 1330      OT SHORT TERM GOAL #1   Title Pt will be independent with HEP 04/23/20--extend through 06/13/20 due to decr frequency    Time 4    Period Weeks    Status On-going      OT SHORT TERM GOAL #2   Title Pt will perform LB dressing (with socks and shoe) with supervision    Baseline min A    Time 4    Period Weeks    Status Achieved      OT SHORT TERM GOAL #3   Title Pt will verbalize understanding of memory compensatory strategies    Time 4    Period Weeks    Status Achieved      OT SHORT TERM GOAL #4   Title Pt will demonstrate  improved shoulder flexion of at least 120 degrees    Baseline 110 RUE    Time 4    Period Weeks    Status Achieved   120* with RUe     OT SHORT TERM GOAL #5   Title Pt will perform environmental scanning in a moderately distracting environment with 75% accuracy.    Time 4    Period Weeks    Status New             OT Long Term Goals - 05/29/20 1116      OT LONG TERM GOAL #1   Title Pt will be independent with updated HEP 05/21/2020    Time 8    Period Weeks    Status New      OT LONG TERM GOAL #2   Title Pt will perform physical and cognitive task simultaneously with 95% accuracy    Time 8    Period Weeks    Status New      OT LONG TERM GOAL #3   Title Pt will complete mod complex cooking with supervision and good safety awareness    Time 8    Period Weeks    Status New      OT LONG TERM GOAL #4   Title Pt will demonstrate improved shoulder flexion of at least 130 degrees to obtain object from high level shelf with pain equal to or less than 5/10     Baseline RUE 110    Time 8    Period Weeks    Status On-going   120* with RUE shoulder     OT LONG TERM GOAL #5   Title Pt will improve RUE range of motion and report greater ease and comfort for toilet hygiene.    Time 8    Period Weeks    Status On-going   still a struggle but getting easier     OT LONG TERM GOAL #6   Title Pt will complete all basic ADLs with mod I    Time 8    Period Weeks    Status Achieved   pt completing independently but takes increased time                Plan - 06/06/20 1327    Clinical Impression Statement Pt is progressing towards goals.  Pt continues to have some pain in RUE with end range motion, but tolerated low range theraband exercises with minimal pain.    OT Occupational Profile and History Problem Focused Assessment - Including review of records relating to presenting problem    Occupational performance deficits (Please refer to evaluation for details): ADL's    Body Structure / Function / Physical Skills ADL;Balance;Decreased knowledge of precautions;Decreased knowledge of use of DME;IADL;GMC;Strength;Pain;UE functional use;ROM;Mobility;FMC    Cognitive Skills Thought;Understand;Problem Solve;Memory    Rehab Potential Good    Clinical Decision Making Limited treatment options, no task modification necessary    Comorbidities Affecting Occupational Performance: None    Modification or Assistance to Complete Evaluation  No modification of tasks or assist necessary to complete eval    OT Frequency --   1x/week for 4 weeks and 2x/week for 4 weeks   OT Duration 8 weeks   1x/week for 4 weeks then 2x/week for 4 weeks or total of 12 visits over extended period d/t scheduling difficulties   OT Treatment/Interventions Self-care/ADL training;Cryotherapy;Aquatic Therapy;Moist Heat;Electrical Stimulation;Ultrasound;Fluidtherapy;Paraffin;Therapeutic exercise;Neuromuscular education;DME and/or AE instruction;Manual Therapy;Functional Mobility  Training;Passive range of motion;Cognitive remediation/compensation;Therapeutic activities;Patient/family education;Energy  conservation    Plan review updated HEP (extend goals due as pt not seen frequency).  Check remaining STGs.    Consulted and Agree with Plan of Care Patient           Patient will benefit from skilled therapeutic intervention in order to improve the following deficits and impairments:   Body Structure / Function / Physical Skills: ADL,Balance,Decreased knowledge of precautions,Decreased knowledge of use of DME,IADL,GMC,Strength,Pain,UE functional use,ROM,Mobility,FMC Cognitive Skills: Thought,Understand,Problem Solve,Memory     Visit Diagnosis: Stiffness of right shoulder, not elsewhere classified  Acute pain of right shoulder  Other lack of coordination  Muscle weakness (generalized)    Problem List There are no problems to display for this patient.   [facility] 06/06/2020, 1:34 PM  [facility] [address] [place], [place], [address] Phone: [phone]   Fax:  [phone]  Name: Brent Young MRN: [id number] Date of Birth: 07/03/1960   Willa Frater, OTR/L [facility] [address] [place], [place]  [address] [phone] phone [phone] 06/06/20 1:34 PM

## 2020-06-06 NOTE — Patient Instructions (Signed)
   Lie on back holding HANDLE OF THE CANE WITH RIGHT HAND. Raise arms over head. Hold 5sec. Repeat 10 times per set.  Do 2 sessions per day.   ROM: Abduction - Wand   Holding wand with RIGHT hand palm up HOLDING HANDLE OF CANE, push wand directly out to side, leading with other hand palm down, until stretch is felt. Hold 5 seconds. Repeat 10 times per set. Do 2 sessions per day. (Lying down)    Press-Up With Wand   Press wand up until elbows are straight, HOLDING HANDLE OF CANE WITH RIGHT HAND Hold 5 seconds. Repeat 10 times. Do 2 sessions per day.    Cane Exercise: External Rotation    Lie with elbows on surface, even with shoulders. Hold cane above chest, palms toward toes. Lower arms back as far as possible. Keep elbows BENT. Hold 3 seconds. Repeat 10times. Do 2 sessions per day.     Strengthening: Resisted Flexion   Attach tube to door.  Hold tubing with one arm at side. Pull forward and up with elbow straight. Move shoulder through pain-free range of motion, no further than shoulder height. Repeat 10 times per set.  Do 1 sessions per day.    Strengthening: Resisted Extension   Attach one end to door.  Hold tubing in one hand, arm forward. Pull arm back, elbow straight. Repeat 10 times per set. Do 1 sessions per day.   Scapular Retraction: Bilateral    Facing anchor, pull arms back, bringing shoulder blades together. Repeat 10 times per set.  Do 1 sessions per day.    Shoulder Retraction / External Rotation With Band    With band held in front, keep elbows at side while rotating hands apart and pulling shoulder blades back. Hold 2 seconds. Repeat 10 times. Do 1 sessions per day.  THEN REPEAT 10X WITH ELBOWS STRAIGHT

## 2020-06-12 ENCOUNTER — Ambulatory Visit: Payer: Medicaid Other | Admitting: Orthopedic Surgery

## 2020-06-13 ENCOUNTER — Telehealth: Payer: Self-pay

## 2020-06-13 ENCOUNTER — Ambulatory Visit: Payer: Self-pay | Admitting: Occupational Therapy

## 2020-06-13 ENCOUNTER — Other Ambulatory Visit (INDEPENDENT_AMBULATORY_CARE_PROVIDER_SITE_OTHER): Payer: Self-pay | Admitting: Primary Care

## 2020-06-13 ENCOUNTER — Telehealth: Payer: Self-pay | Admitting: Physical Therapy

## 2020-06-13 ENCOUNTER — Ambulatory Visit: Payer: Medicaid Other

## 2020-06-13 ENCOUNTER — Ambulatory Visit: Payer: Self-pay | Admitting: Physical Therapy

## 2020-06-13 ENCOUNTER — Encounter: Payer: Self-pay | Admitting: Physical Therapy

## 2020-06-13 ENCOUNTER — Other Ambulatory Visit: Payer: Self-pay

## 2020-06-13 VITALS — BP 85/40 | HR 119

## 2020-06-13 DIAGNOSIS — R2681 Unsteadiness on feet: Secondary | ICD-10-CM

## 2020-06-13 DIAGNOSIS — M6281 Muscle weakness (generalized): Secondary | ICD-10-CM

## 2020-06-13 DIAGNOSIS — M25611 Stiffness of right shoulder, not elsewhere classified: Secondary | ICD-10-CM

## 2020-06-13 NOTE — Therapy (Signed)
[facility] [address] [place], [place], [address] Phone: [phone]   Fax:  [phone]  Physical Therapy Treatment  Patient Details  Name: Brent Young MRN: [id number] Date of Birth: Apr 01, 1960 Referring Provider (PT): Leandro Reasoner, MD.   Encounter Date: 06/13/2020   PT End of Session - 06/13/20 1102    Visit Number 6    Number of Visits 13    Date for PT Re-Evaluation 06/24/20   POC for 8 weeks, Cert for 90 days   Authorization Type [employer]    PT Start Time 1104    PT Stop Time 1138    PT Time Calculation (min) 34 min    Equipment Utilized During Treatment Gait belt    Activity Tolerance Patient tolerated treatment well    Behavior During Therapy [facility] for tasks assessed/performed           Past Medical History:  Diagnosis Date  . Allergy   . Arthritis   . Diabetes mellitus without complication ([place])   . Hypertension     Past Surgical History:  Procedure Laterality Date  . FRACTURE SURGERY Left    Patient fractured left arm    Vitals:   06/13/20 1106 06/13/20 1115 06/13/20 1126  BP: 95/66 (!) 84/65 (!) 85/40  Pulse: (!) 115 (!) 117 (!) 119    (Sitting)   (Standing)   (Standing)    Subjective Assessment - 06/13/20 1106    Subjective I have had some falls-2 falls in the past week.  Had a fall messing with my mom's dog last week, and then this morning missing the last step carrying things down the steps.  I've been sleeping more during the day.  Missed my appt for my shoulder yesterday-slept through it.    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 Yes    Pain Score 5     Pain Location Shoulder    Pain Orientation Right    Pain Descriptors / Indicators Aching    Pain Onset More than  a month ago    Pain Frequency Intermittent    Aggravating Factors  malpositioning, worse from fall    Pain Relieving Factors rest                             [facility] Adult PT Treatment/Exercise - 06/13/20 0001      Transfers   Transfers Sit to Stand;Stand to Sit    Sit to Stand 5: Supervision;From bed;With upper extremity assist    Stand to Sit 5: Supervision;Without upper extremity assist;To bed;With upper extremity assist    Comments Performed x 2 reps to assess standing BP measures (85/66 and 85/40 in standing).  Pt requires min guard in standing with side BOS with increased sway.      Self-Care   Self-Care Other Self-Care Comments    Other Self-Care Comments  Discussed pt's recent falls, and discussed fall prevention education (printed handout but forgot to give).  Especially discussed in light of pt's low blood pressure readings (since MD visit 3/8 with ?change in medications), making sure to stay well hydrated and to perform gentle seated leg exercises prior to standing after prolonged sitting.  Educated pt that if  he hasn't heard from MD by later today, to contact MD to ask about how to proceed given low BP readings.      Exercises   Exercises Other Exercises    Other Exercises  Performed seated ankle pumps, 2 sets x 10, seated marching 2 sets x 5 reps, seated LAQ 2 sets x 5 reps, seated step out and in, x 5 reps each leg.                  PT Education - 06/13/20 1324    Education Details Fall prevention education; BP measures-to contact MD if they have not followed up with patient today    Person(s) Educated Patient    Methods Explanation    Comprehension Verbalized understanding            PT Short Term Goals - 05/29/20 1031      PT SHORT TERM GOAL #1   Title Patient will be independent with initial balance/strengthening HEP (all STGs Due: 05/21/20)    Baseline reports independence with exercises not completing daily    Time 4    Period Weeks     Status Partially Met    Target Date 05/21/20      PT SHORT TERM GOAL #2   Title Patient will improve 5x sit <> stand to </= 25 seconds to demonstrate improved mobility and balance.    Baseline 31.07 secs w/ UE; 16.50 secs w/ UE    Time 4    Period Weeks    Status Achieved      PT SHORT TERM GOAL #3   Title Patient will improve Berg Balance to >/= 42/56 to demonstrate improved balance and reduced risk for falls    Baseline 38/56; deferred as assesed only two visits ago    Time 4    Period Weeks    Status Deferred      PT SHORT TERM GOAL #4   Title Patient will demonstrate ability to ascend/descend 12 stairs with SPC vs. rail with supervision for improved household mobility    Baseline L Rail CGA x 4 stairs; Supervisoin/CGA for 16 stairs with single rail    Time 4    Period Weeks    Status Partially Met             PT Long Term Goals - 04/30/20 1106      PT LONG TERM GOAL #1   Title Patient will be independent with Final HEP for strengthening/balance (All LTGs Due: 06/11/20)    Baseline no HEP established    Time 8    Period Weeks    Status New    Target Date 06/11/20      PT LONG TERM GOAL #2   Title Patient will improve Berg Balance to >/= 45/56 to demonstrate improved balance and reduced risk for falls    Baseline 38/56    Time 8    Period Weeks    Status New      PT LONG TERM GOAL #3   Title Patient will improve TUG to </= 12 seconds with LRAD to demonstrate reduced fall risk    Baseline 14.81 secs    Time 8    Period Weeks    Status New      PT LONG TERM GOAL #4   Title Patient will improve 5x sit <> stand to </= 20 seconds to demonstrate improved mobility and reduced fall risk    Baseline 31.07 secs    Time 8  Period Weeks    Status New      PT LONG TERM GOAL #5   Title Patient will improve gait speed to >/= 3.0 ft/sec to demonstrate improved community mobility    Baseline 2.78 ft/sec    Time 8    Period Weeks    Status New      PT LONG TERM  GOAL #6   Title Patient will improve FOTO to >/= 72% upon discharge    Baseline 58%    Time 8    Period Weeks    Status New                 Plan - 06/13/20 1326    Clinical Impression Statement Continued low blood pressure readings today in both sitting and standing.  Education provided on fall prevention and low blood pressure measures.  Pt performed sitting exercises only this visit; c/o lightheadedness upon standing.  MD made aware.  Pt will likely continue to benefit from skilled PT to further address balance and mobility, but may need to consider placing patient on hold while BP/medication is controlled.    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, obesity    Examination-Activity Limitations Bathing;Dressing;Locomotion Level;Stand;Stairs    Examination-Participation Restrictions Occupation;Driving;Community Activity    Stability/Clinical Decision Making Evolving/Moderate complexity    Rehab Potential Good    PT Frequency 1x / week    PT Duration 4 weeks   initially due to transportation, followed by 2x/week for 4 weeks   PT Treatment/Interventions ADLs/Self Care Home Management;Cryotherapy;Electrical Stimulation;Moist Heat;DME Instruction;Gait training;Stair training;Functional mobility training;Therapeutic activities;Therapeutic exercise;Balance training;Neuromuscular re-education;Patient/family education;Orthotic Fit/Training;Manual techniques;Vestibular;Passive range of motion    PT Next Visit Plan With monitoring BP before/after medications? How has it been? Pt only has one additional scheduled visit; please check LTGs and need to decide recert/discharge and schedule more appts.  Continue to progress HEP as needed. Continue balance/strengthening. Work on activites to [employer]. How has BP been?     Consulted and Agree with Plan of Care Patient           Patient will benefit from skilled therapeutic intervention in order to improve the following deficits and impairments:  Abnormal gait,Decreased balance,Decreased endurance,Difficulty walking,Impaired sensation,Pain,Impaired UE functional use,Decreased strength,Decreased safety awareness,Decreased knowledge of use of DME,Decreased coordination,Decreased activity tolerance,Dizziness  Visit Diagnosis: Unsteadiness on feet  Muscle weakness (generalized)     Problem List There are no problems to display for this patient.   Aissatou Fronczak W. 06/13/2020, 1:31 PM Frazier Butt., PT  [facility] [address] [place], [place], [address] Phone: [phone]   Fax:  [phone]  Name: Brent Young MRN: [id number] Date of Birth: [birth year]

## 2020-06-13 NOTE — Telephone Encounter (Signed)
Spoke with someone at PCP office to let them know of pt's low BP readings during PT session today.  They report they will give message to the PCP and provide follow-up phone call to patient.    PT made patient aware.  Lonia Blood, PT 06/13/20 1:33 PM Phone: [phone] Fax: [phone]

## 2020-06-13 NOTE — Patient Instructions (Signed)

## 2020-06-13 NOTE — Telephone Encounter (Signed)
Copied from CRM [id number]. Topic: General - Other >> Jun 13, 2020 11:29 AM Pawlus, Maxine Glenn A wrote: Reason for CRM: [facility] rehab wanted to let Marcelino Duster know that the Pt had low BP readings, 95/66 and has had some dizziness and falls, Pt is currently at the rehab center, please advise.

## 2020-06-13 NOTE — Therapy (Signed)
[facility] [address] [place], [place], [address] Phone: [phone]   Fax:  [phone]  Patient Details  Name: Brent Young MRN: [id number] Date of Birth: 16-Sep-1960 Referring Provider:  Alison Murray, MD  Encounter Date: 06/13/2020   Pt arrived and participated in education with PT regarding low blood pressure and recent fall. Blood pressure too low for therapy and activity this day .  Junious Dresser MOT, OTR/L  06/13/2020, 11:50 AM  [facility] [address] [place], [place], [address] Phone: [phone]   Fax:  [phone]

## 2020-06-14 NOTE — Telephone Encounter (Signed)
Patient is able to check BP at home. He is aware to D/c HCTZ and hold amlodipine and losartan if Bp is <100/60.

## 2020-06-20 ENCOUNTER — Ambulatory Visit: Payer: Self-pay

## 2020-06-20 ENCOUNTER — Ambulatory Visit: Payer: Self-pay | Admitting: Occupational Therapy

## 2020-06-20 ENCOUNTER — Ambulatory Visit (INDEPENDENT_AMBULATORY_CARE_PROVIDER_SITE_OTHER): Payer: Self-pay | Admitting: *Deleted

## 2020-06-20 NOTE — Telephone Encounter (Signed)
Pt calling to report BP fluctuating. States was to go to P.T. Alerted them of BP and was told to call PCP. See messages from 3/24-3/25. States BP presently, home monitor 290/90 then 140/40. Both taken sitting. In reviewing messages, pt stated he did stop the HCTZ but was not aware of parameters to hold Losartan and amlodipine. States will follow instructions. Denies any dizziness, no headache. Assured   NT would route to practice for PCPs review and final disposition. Pt verbalizes understanding.  CB# [phone]  Reason for Disposition . [1] Caller has NON-URGENT medicine question about med that PCP prescribed AND [2] triager unable to answer question  Answer Assessment - Initial Assessment Questions 1. NAME of MEDICATION: "What medicine are you calling about?"     See summary 2. QUESTION: "What is your question?" (e.g., medication refill, side effect)     *No Answer* 3. PRESCRIBING HCP: "Who prescribed it?" Reason: if prescribed by specialist, call should be referred to that group.     *No Answer* 4. SYMPTOMS: "Do you have any symptoms?"     *No Answer* 5. SEVERITY: If symptoms are present, ask "Are they mild, moderate or severe?"     *No Answer* 6. PREGNANCY:  "Is there any chance that you are pregnant?" "When was your last menstrual period?"     *No Answer*  Protocols used: MEDICATION QUESTION CALL-A-AH

## 2020-06-21 ENCOUNTER — Ambulatory Visit: Payer: Self-pay

## 2020-06-23 NOTE — Telephone Encounter (Signed)
Called patient to discuss Bp reading no answer asked to called office Monday.

## 2020-06-28 ENCOUNTER — Other Ambulatory Visit: Payer: Self-pay

## 2020-06-28 ENCOUNTER — Encounter (INDEPENDENT_AMBULATORY_CARE_PROVIDER_SITE_OTHER): Payer: Self-pay | Admitting: Primary Care

## 2020-06-28 ENCOUNTER — Ambulatory Visit (INDEPENDENT_AMBULATORY_CARE_PROVIDER_SITE_OTHER): Payer: Self-pay | Admitting: Primary Care

## 2020-06-28 VITALS — BP 149/84 | HR 102 | Temp 97.3°F | Ht 73.0 in | Wt 267.0 lb

## 2020-06-28 DIAGNOSIS — E119 Type 2 diabetes mellitus without complications: Secondary | ICD-10-CM

## 2020-06-28 DIAGNOSIS — I10 Essential (primary) hypertension: Secondary | ICD-10-CM

## 2020-06-28 LAB — POCT GLYCOSYLATED HEMOGLOBIN (HGB A1C): Hemoglobin A1C: 8.7 % — AB (ref 4.0–5.6)

## 2020-06-28 LAB — GLUCOSE, POCT (MANUAL RESULT ENTRY): POC Glucose: 160 mg/dl — AB (ref 70–99)

## 2020-06-28 MED ORDER — HYDROCHLOROTHIAZIDE 25 MG PO TABS: 25.0000 mg | ORAL_TABLET | Freq: Every day | ORAL | 3 refills | Status: DC

## 2020-06-28 NOTE — Progress Notes (Signed)
Established Patient Office Visit  Subjective:  Patient ID: Brent Young, male    DOB: 1960/04/06  Age: 60 y.o. MRN: [id number]  CC:  Chief Complaint  Patient presents with  . Diabetes    HPI Brent Young is a 60 year old obese male who presents for diabetes management-he denies polyuria. Endorse  polydipsia and polyphagia, no visual changes.  Hypertension is elevated today at 149/84 Denies shortness of breath, headaches, chest pain or lower extremity edema, sudden onset, vision changes, unilateral weakness, dizziness, paresthesias.  He states he is compliant  with taking all prescribed medications. Past Medical History:  Diagnosis Date  . Allergy   . Arthritis   . Diabetes mellitus without complication (HCC)   . Hypertension     Past Surgical History:  Procedure Laterality Date  . FRACTURE SURGERY Left    Patient fractured left arm    Family History  Problem Relation Age of Onset  . Colon cancer Maternal Grandmother   . Esophageal cancer Neg Hx   . Stomach cancer Neg Hx   . Rectal cancer Neg Hx     Social History   Socioeconomic History  . Marital status: Married    Spouse name: Not on file  . Number of children: Not on file  . Years of education: Not on file  . Highest education level: Not on file  Occupational History  . Not on file  Tobacco Use  . Smoking status: Never Smoker  . Smokeless tobacco: Never Used  Vaping Use  . Vaping Use: Never used  Substance and Sexual Activity  . Alcohol use: Yes    Comment: occasionally  . Drug use: No  . Sexual activity: Not on file  Other Topics Concern  . Not on file  Social History Narrative  . Not on file   Social Determinants of Health   Financial Resource Strain: Not on file  Food Insecurity: Not on file  Transportation Needs: Not on file  Physical Activity: Not on file  Stress: Not on file  Social Connections: Not on file  Intimate Partner Violence: Not on file    Outpatient Medications  Prior to Visit  Medication Sig Dispense Refill  . amLODipine (NORVASC) 10 MG tablet Take 1 tablet (10 mg total) by mouth daily. 90 tablet 1  . aspirin EC 81 MG tablet Take 81 mg by mouth daily. Swallow whole.    Marland Kitchen atorvastatin (LIPITOR) 10 MG tablet Take 1 tablet (10 mg total) by mouth daily. 90 tablet 3  . gabapentin (NEURONTIN) 300 MG capsule Take 1 capsule (300 mg total) by mouth 3 (three) times daily. 90 capsule 1  . glimepiride (AMARYL) 4 MG tablet Take 1 tablet (4 mg total) by mouth every morning. 90 tablet 1  . losartan (COZAAR) 25 MG tablet Take 1 tablet (25 mg total) by mouth daily. 90 tablet 1  . metFORMIN (GLUCOPHAGE) 1000 MG tablet Take 1 tablet (1,000 mg total) by mouth 2 (two) times daily with a meal. 180 tablet 3  . Oxcarbazepine (TRILEPTAL) 300 MG tablet Take 1 tablet (300 mg total) by mouth 3 (three) times daily. 270 tablet 1  . sitaGLIPtin (JANUVIA) 50 MG tablet Take 1 tablet (50 mg total) by mouth daily. 90 tablet 1   No facility-administered medications prior to visit.    No Known Allergies  ROS Review of Systems  Gastrointestinal: Positive for abdominal distention.  Endocrine: Positive for polydipsia and polyuria.  All other systems reviewed and are negative.  Objective:    Physical Exam Vitals reviewed.  Constitutional:      Appearance: He is obese.  HENT:     Head: Normocephalic.     Right Ear: External ear normal.     Left Ear: External ear normal.  Eyes:     Extraocular Movements: Extraocular movements intact.  Cardiovascular:     Rate and Rhythm: Normal rate and regular rhythm.  Pulmonary:     Effort: Pulmonary effort is normal.     Breath sounds: Normal breath sounds.  Abdominal:     General: Bowel sounds are normal. There is distension.     Palpations: Abdomen is soft.  Musculoskeletal:        General: Normal range of motion.     Cervical back: Normal range of motion and neck supple.  Skin:    General: Skin is warm and dry.   Neurological:     Mental Status: He is alert and oriented to person, place, and time.  Psychiatric:        Mood and Affect: Mood normal.        Behavior: Behavior normal.        Thought Content: Thought content normal.        Judgment: Judgment normal.     BP (!) 149/84 (BP Location: Right Arm, Patient Position: Sitting, Cuff Size: Normal)   Pulse (!) 102   Temp (!) 97.3 F (36.3 C) (Temporal)   Ht 6\' 1"  (1.854 m)   Wt 267 lb (121.1 kg)   SpO2 92%   BMI 35.23 kg/m  Wt Readings from Last 3 Encounters:  06/28/20 267 lb (121.1 kg)  05/28/20 271 lb 3.2 oz (123 kg)  04/16/20 265 lb 12.8 oz (120.6 kg)     Health Maintenance Due  Topic Date Due  . COLONOSCOPY (Pts 45-105yrs Insurance coverage will need to be confirmed)  Never done  . COVID-19 Vaccine (2 - Moderna 3-dose series) 04/07/2020    There are no preventive care reminders to display for this patient.  No results found for: TSH Lab Results  Component Value Date   WBC 7.5 04/16/2020   HGB 16.9 04/16/2020   HCT 51.5 (H) 04/16/2020   MCV 94 04/16/2020   PLT 239 04/16/2020   Lab Results  Component Value Date   NA 136 04/16/2020   K 3.9 04/16/2020   CO2 23 04/16/2020   GLUCOSE 331 (H) 04/16/2020   BUN 10 04/16/2020   CREATININE 0.80 04/16/2020   BILITOT 1.4 (H) 04/16/2020   ALKPHOS 176 (H) 04/16/2020   AST 30 04/16/2020   ALT 26 04/16/2020   PROT 8.6 (H) 04/16/2020   ALBUMIN 4.7 04/16/2020   CALCIUM 9.7 04/16/2020   ANIONGAP 11 03/17/2017   Lab Results  Component Value Date   CHOL 181 04/16/2020   Lab Results  Component Value Date   HDL 40 04/16/2020   Lab Results  Component Value Date   LDLCALC 115 (H) 04/16/2020   Lab Results  Component Value Date   TRIG 147 04/16/2020   Lab Results  Component Value Date   CHOLHDL 4.5 04/16/2020   Lab Results  Component Value Date   HGBA1C 8.7 (A) 06/28/2020      Assessment & Plan:  Alekai was seen today for diabetes.  Diagnoses and all orders for  this visit:  Type 2 diabetes mellitus without complication, without long-term current use of insulin (HCC) -     HgB A1c 8.7 not at glycemic control but improvement  from previous A1c of 10.8-      Glucose (CBG) No changes in current medication. Continue Januvia 50 mg daily Amaryl 4 mg daily Metformin at 1000 mg daily. Continue to monitor foods that are high in carbohydrates are the following rice, potatoes, breads, sugars, and pastas.  Reduction in the intake (eating) will assist in lowering your blood sugars.  Essential hypertension Counseled on blood pressure goal of less than 130/80, low-sodium, DASH diet, medication compliance, 150 minutes of moderate intensity exercise per week. Discussed medication compliance, adverse effects. -     hydrochlorothiazide (HYDRODIURIL) 25 MG tablet; Take 1 tablet (25 mg total) by mouth daily.    No orders of the defined types were placed in this encounter.   Follow-up: No follow-ups on file.    Grayce Sessions, NP

## 2020-06-28 NOTE — Patient Instructions (Signed)

## 2020-07-10 ENCOUNTER — Telehealth (INDEPENDENT_AMBULATORY_CARE_PROVIDER_SITE_OTHER): Payer: Self-pay | Admitting: Primary Care

## 2020-07-10 ENCOUNTER — Other Ambulatory Visit (INDEPENDENT_AMBULATORY_CARE_PROVIDER_SITE_OTHER): Payer: Self-pay | Admitting: Primary Care

## 2020-07-10 DIAGNOSIS — E119 Type 2 diabetes mellitus without complications: Secondary | ICD-10-CM

## 2020-07-10 MED ORDER — SITAGLIPTIN PHOSPHATE 50 MG PO TABS: 50.0000 mg | ORAL_TABLET | Freq: Every day | ORAL | 0 refills | Status: DC

## 2020-07-10 NOTE — Telephone Encounter (Signed)
Incomplete call.    See next refill request for the Januvia.   Refill approved for 90 days supply, 0 refills on 07/10/2020.

## 2020-08-15 ENCOUNTER — Ambulatory Visit: Payer: Self-pay | Admitting: Occupational Therapy

## 2020-08-15 ENCOUNTER — Ambulatory Visit: Payer: Self-pay

## 2020-08-15 ENCOUNTER — Ambulatory Visit: Payer: Medicaid Other

## 2020-09-26 ENCOUNTER — Ambulatory Visit (INDEPENDENT_AMBULATORY_CARE_PROVIDER_SITE_OTHER): Payer: Medicaid Other | Admitting: Primary Care

## 2020-10-08 ENCOUNTER — Other Ambulatory Visit: Payer: Self-pay

## 2020-10-08 ENCOUNTER — Encounter (INDEPENDENT_AMBULATORY_CARE_PROVIDER_SITE_OTHER): Payer: Self-pay | Admitting: Primary Care

## 2020-10-08 ENCOUNTER — Ambulatory Visit (INDEPENDENT_AMBULATORY_CARE_PROVIDER_SITE_OTHER): Payer: Self-pay | Admitting: Primary Care

## 2020-10-08 VITALS — BP 191/86 | HR 92 | Temp 97.5°F | Ht 73.0 in | Wt 269.6 lb

## 2020-10-08 DIAGNOSIS — R2689 Other abnormalities of gait and mobility: Secondary | ICD-10-CM

## 2020-10-08 DIAGNOSIS — Z760 Encounter for issue of repeat prescription: Secondary | ICD-10-CM

## 2020-10-08 DIAGNOSIS — E119 Type 2 diabetes mellitus without complications: Secondary | ICD-10-CM

## 2020-10-08 LAB — POCT GLYCOSYLATED HEMOGLOBIN (HGB A1C): Hemoglobin A1C: 9.1 % — AB (ref 4.0–5.6)

## 2020-10-08 MED ORDER — GLIMEPIRIDE 4 MG PO TABS: 4.0000 mg | ORAL_TABLET | Freq: Every morning | ORAL | 1 refills | Status: DC

## 2020-10-08 MED ORDER — SITAGLIPTIN PHOSPHATE 50 MG PO TABS: 50.0000 mg | ORAL_TABLET | Freq: Every day | ORAL | 0 refills | Status: DC

## 2020-10-08 NOTE — Progress Notes (Signed)
[facility]     Brent Young is a 60 y.o. male who presents for an follow up  evaluation of Type 2 diabetes mellitus.  Current symptoms/problems include paresthesia of the feet and polyuria and have been worsening. Symptoms have been present for 2 months.  Current diabetic medications include oral agents (triple therapy): metformin 1000mg  bid, Amaryl 4mg  daily  januvia 50mg   The patient was initially diagnosed with Type 2 diabetes mellitus based on the following criteria:  ADA guidelines .  Current monitoring regimen: none Home blood sugar records:  none Any episodes of hypoglycemia? no  Known diabetic complications: peripheral neuropathy and cardiovascular disease Cardiovascular risk factors: advanced age (older than 47 for men, 23 for women), diabetes mellitus, hypertension, male gender, and obesity (BMI >= 30 kg/m2) Eye exam current (within one year): no Weight trend: stable Prior visit with CDE:Yes each visit  Current diet: in general, an "unhealthy" diet Current exercise: cardiovascular workout on exercise equipment and walking Medication Compliance?  No  Hypertension management - not taking Bp meds daily forgets sometimes and is elevated last visit discussed risk factor obese, AA, previous CVA, >60- Equip with information he has to decide what he is going to do about his health  Is He on ACE inhibitor or angiotensin II receptor blocker?  Yes  ARB- Losartan    Review of Systems  Genitourinary:  Positive for frequency and urgency.  Musculoskeletal:        Balance problem unstable gait fell 2 in the last 2 weeks   All other systems reviewed and are negative.  Objective:    There were no vitals taken for this visit.  Physical Exam  General: No apparent distress. Eyes: Extraocular eye movements intact, pupils equal and round. Neck: Supple, trachea midline. Thyroid: No enlargement, mobile without fixation, no tenderness. Cardiovascular: Regular rhythm  and rate, no murmur, normal radial pulses. Respiratory: Normal respiratory effort, clear to auscultation. Gastrointestinal: Normal pitch active bowel sounds, nontender abdomen without distention or appreciable hepatomegaly. Neurologic: A&O Musculoskeletal: Normal muscle tone, no tenderness on palpation of tibia Skin: Appropriate warmth, no visible rash. Mental status: Alert, conversant, speech clear, thought logical, appropriate mood Hematologic/lymphatic: No cervical adenopathy, no visible ecchymoses.  Lab Review Glucose (mg/dL)  Date Value  331 (H)  10/11/2019 171 (H)   Glucose, Bld (mg/dL)  Date Value  76 272 (H)  01/27/2014 282 (H)   CO2 (mmol/L)  Date Value  04/16/2020 23  10/11/2019 33 (H)  03/17/2017 29   BUN (mg/dL)  Date Value  04/18/2020 10  10/11/2019 14  03/17/2017 5 (L)  01/27/2014 6   Creatinine, Ser (mg/dL)  Date Value  10/13/2019 0.80  10/11/2019 0.84  03/17/2017 0.75   Current Outpatient Medications on File Prior to Visit  Medication Sig Dispense Refill   amLODipine (NORVASC) 10 MG tablet Take 1 tablet (10 mg total) by mouth daily. 90 tablet 1   aspirin EC 81 MG tablet Take 81 mg by mouth daily. Swallow whole.     atorvastatin (LIPITOR) 10 MG tablet Take 1 tablet (10 mg total) by mouth daily. 90 tablet 3   gabapentin (NEURONTIN) 300 MG capsule Take 1 capsule (300 mg total) by mouth 3 (three) times daily. 90 capsule 1   glimepiride (AMARYL) 4 MG tablet Take 1 tablet (4 mg total) by mouth every morning. 90 tablet 1   hydrochlorothiazide (HYDRODIURIL) 25 MG tablet Take 1 tablet (25 mg total) by mouth daily. 90 tablet 3  losartan (COZAAR) 25 MG tablet Take 1 tablet (25 mg total) by mouth daily. 90 tablet 1   metFORMIN (GLUCOPHAGE) 1000 MG tablet Take 1 tablet (1,000 mg total) by mouth 2 (two) times daily with a meal. 180 tablet 3   Oxcarbazepine (TRILEPTAL) 300 MG tablet Take 1 tablet (300 mg total) by mouth 3 (three) times daily. 270  tablet 1   sitaGLIPtin (JANUVIA) 50 MG tablet Take 1 tablet (50 mg total) by mouth daily. 90 tablet 0   No current facility-administered medications on file prior to visit.       Assessment:   Type 2 diabetes mellitus without complication, without long-term current use of insulin (HCC) -     Lipid Panel -     HgB A1c  Diabetes Mellitus type II, under unsatisfactory control.   There are no diagnoses linked to this encounter.    Plan:    1.  Rx changes: none  2.  Education: Reviewed 'ABCs' of diabetes management (respective goals in parentheses):  A1C (<7), blood pressure (<130/80), and cholesterol (LDL <100). Counseled Patient on the risk factors of co- morbidities with uncontrol diabetes  Complications -diabetic retinopathy, (close your eyes ? What do you see nothing) nephropathy decrease in kidney function- can lead to dialysis-on a machine 3 days a week to filter your kidney, neuropathy- numbness and tinging in your hands and feet,  increase risk of heart attack and stroke, and amputation due to decrease wound healing and circulation. Decrease your risk by taking medication daily as prescribed, monitor carbohydrates- foods that are high in carbohydrates are the following rice, potatoes, breads, sugars, and pastas.  Reduction in the intake (eating) will assist in lowering your blood sugars. Exercise daily at least 30 minutes daily.   3. CHO counting diet discussed.  Reviewed CHO amount in various foods and how to read nutrition labels.  Discussed recommended serving sizes.   4.  Recommend check BG 3  times a day  5. Recommended increase physical activity - goal is 150 minutes per week  -     Medication refill -     glimepiride (AMARYL) 4 MG tablet; Take 1 tablet (4 mg total) by mouth every morning. sitaGLIPtin (JANUVIA) 50 MG tablet; Take 1 tablet (50 mg total) by mouth daily. -      Balance problem Hx of CVA -     Ambulatory referral to Neurology    This note has been created  with Dragon speech recognition software and Paediatric nurse. Any transcriptional errors are unintentional.   Grayce Sessions, NP 10/08/2020, 10:43 AM

## 2020-10-09 ENCOUNTER — Encounter: Payer: Self-pay | Admitting: Neurology

## 2020-10-09 LAB — LIPID PANEL
Chol/HDL Ratio: 3.3 ratio (ref 0.0–5.0)
Cholesterol, Total: 141 mg/dL (ref 100–199)
HDL: 43 mg/dL (ref >39)
LDL Chol Calc (NIH): 75 mg/dL (ref 0–99)
Triglycerides: 130 mg/dL (ref 0–149)
VLDL Cholesterol Cal: 23 mg/dL (ref 5–40)

## 2020-11-08 ENCOUNTER — Ambulatory Visit: Payer: Medicaid Other | Admitting: Pharmacist

## 2020-12-13 ENCOUNTER — Ambulatory Visit: Payer: Medicaid Other | Admitting: Neurology

## 2020-12-16 ENCOUNTER — Other Ambulatory Visit (INDEPENDENT_AMBULATORY_CARE_PROVIDER_SITE_OTHER): Payer: Self-pay | Admitting: Primary Care

## 2020-12-16 DIAGNOSIS — E0821 Diabetes mellitus due to underlying condition with diabetic nephropathy: Secondary | ICD-10-CM

## 2020-12-16 DIAGNOSIS — E119 Type 2 diabetes mellitus without complications: Secondary | ICD-10-CM

## 2020-12-16 NOTE — Telephone Encounter (Signed)
Sent to PCP ?

## 2021-01-08 ENCOUNTER — Ambulatory Visit (INDEPENDENT_AMBULATORY_CARE_PROVIDER_SITE_OTHER): Payer: Medicaid Other | Admitting: Primary Care

## 2021-02-16 ENCOUNTER — Other Ambulatory Visit: Payer: Self-pay

## 2021-02-16 ENCOUNTER — Emergency Department (HOSPITAL_COMMUNITY): Payer: Medicaid Other

## 2021-02-16 ENCOUNTER — Encounter (HOSPITAL_COMMUNITY): Payer: Self-pay | Admitting: *Deleted

## 2021-02-16 ENCOUNTER — Inpatient Hospital Stay (HOSPITAL_COMMUNITY)
Admission: EM | Admit: 2021-02-16 | Discharge: 2021-02-27 | DRG: 065 | Disposition: A | Discharge status: Rehabilitation Facility | Payer: Medicaid Other | Attending: Internal Medicine | Admitting: Internal Medicine

## 2021-02-16 DIAGNOSIS — F40240 Claustrophobia: Secondary | ICD-10-CM | POA: Diagnosis present

## 2021-02-16 DIAGNOSIS — Z5320 Procedure and treatment not carried out because of patient's decision for unspecified reasons: Secondary | ICD-10-CM | POA: Diagnosis present

## 2021-02-16 DIAGNOSIS — Z713 Dietary counseling and surveillance: Secondary | ICD-10-CM

## 2021-02-16 DIAGNOSIS — I6782 Cerebral ischemia: Secondary | ICD-10-CM | POA: Diagnosis present

## 2021-02-16 DIAGNOSIS — Z6836 Body mass index (BMI) 36.0-36.9, adult: Secondary | ICD-10-CM

## 2021-02-16 DIAGNOSIS — Z20822 Contact with and (suspected) exposure to covid-19: Secondary | ICD-10-CM | POA: Diagnosis present

## 2021-02-16 DIAGNOSIS — I700 Atherosclerosis of aorta: Secondary | ICD-10-CM | POA: Diagnosis present

## 2021-02-16 DIAGNOSIS — F17210 Nicotine dependence, cigarettes, uncomplicated: Secondary | ICD-10-CM | POA: Diagnosis present

## 2021-02-16 DIAGNOSIS — E876 Hypokalemia: Secondary | ICD-10-CM | POA: Diagnosis present

## 2021-02-16 DIAGNOSIS — W19XXXA Unspecified fall, initial encounter: Secondary | ICD-10-CM

## 2021-02-16 DIAGNOSIS — K5900 Constipation, unspecified: Secondary | ICD-10-CM | POA: Diagnosis present

## 2021-02-16 DIAGNOSIS — M25511 Pain in right shoulder: Secondary | ICD-10-CM

## 2021-02-16 DIAGNOSIS — R1310 Dysphagia, unspecified: Secondary | ICD-10-CM | POA: Diagnosis present

## 2021-02-16 DIAGNOSIS — Z8719 Personal history of other diseases of the digestive system: Secondary | ICD-10-CM

## 2021-02-16 DIAGNOSIS — I672 Cerebral atherosclerosis: Secondary | ICD-10-CM | POA: Diagnosis present

## 2021-02-16 DIAGNOSIS — W06XXXA Fall from bed, initial encounter: Secondary | ICD-10-CM | POA: Diagnosis present

## 2021-02-16 DIAGNOSIS — I10 Essential (primary) hypertension: Secondary | ICD-10-CM

## 2021-02-16 DIAGNOSIS — R29707 NIHSS score 7: Secondary | ICD-10-CM | POA: Diagnosis present

## 2021-02-16 DIAGNOSIS — J811 Chronic pulmonary edema: Secondary | ICD-10-CM | POA: Diagnosis not present

## 2021-02-16 DIAGNOSIS — Z716 Tobacco abuse counseling: Secondary | ICD-10-CM

## 2021-02-16 DIAGNOSIS — I119 Hypertensive heart disease without heart failure: Secondary | ICD-10-CM | POA: Diagnosis present

## 2021-02-16 DIAGNOSIS — Z7984 Long term (current) use of oral hypoglycemic drugs: Secondary | ICD-10-CM

## 2021-02-16 DIAGNOSIS — Z8673 Personal history of transient ischemic attack (TIA), and cerebral infarction without residual deficits: Secondary | ICD-10-CM

## 2021-02-16 DIAGNOSIS — R471 Dysarthria and anarthria: Secondary | ICD-10-CM | POA: Diagnosis present

## 2021-02-16 DIAGNOSIS — R778 Other specified abnormalities of plasma proteins: Secondary | ICD-10-CM

## 2021-02-16 DIAGNOSIS — R9431 Abnormal electrocardiogram [ECG] [EKG]: Secondary | ICD-10-CM

## 2021-02-16 DIAGNOSIS — D72829 Elevated white blood cell count, unspecified: Secondary | ICD-10-CM | POA: Diagnosis present

## 2021-02-16 DIAGNOSIS — R0600 Dyspnea, unspecified: Secondary | ICD-10-CM | POA: Diagnosis not present

## 2021-02-16 DIAGNOSIS — I248 Other forms of acute ischemic heart disease: Secondary | ICD-10-CM | POA: Diagnosis present

## 2021-02-16 DIAGNOSIS — E1165 Type 2 diabetes mellitus with hyperglycemia: Secondary | ICD-10-CM | POA: Diagnosis present

## 2021-02-16 DIAGNOSIS — G8194 Hemiplegia, unspecified affecting left nondominant side: Secondary | ICD-10-CM | POA: Diagnosis present

## 2021-02-16 DIAGNOSIS — I639 Cerebral infarction, unspecified: Secondary | ICD-10-CM | POA: Diagnosis present

## 2021-02-16 DIAGNOSIS — M19011 Primary osteoarthritis, right shoulder: Secondary | ICD-10-CM | POA: Diagnosis present

## 2021-02-16 DIAGNOSIS — E785 Hyperlipidemia, unspecified: Secondary | ICD-10-CM | POA: Diagnosis present

## 2021-02-16 DIAGNOSIS — H02402 Unspecified ptosis of left eyelid: Secondary | ICD-10-CM | POA: Diagnosis present

## 2021-02-16 DIAGNOSIS — I6329 Cerebral infarction due to unspecified occlusion or stenosis of other precerebral arteries: Principal | ICD-10-CM | POA: Diagnosis present

## 2021-02-16 DIAGNOSIS — E1169 Type 2 diabetes mellitus with other specified complication: Secondary | ICD-10-CM

## 2021-02-16 DIAGNOSIS — F1290 Cannabis use, unspecified, uncomplicated: Secondary | ICD-10-CM | POA: Diagnosis present

## 2021-02-16 DIAGNOSIS — Z79899 Other long term (current) drug therapy: Secondary | ICD-10-CM

## 2021-02-16 DIAGNOSIS — E669 Obesity, unspecified: Secondary | ICD-10-CM | POA: Diagnosis present

## 2021-02-16 DIAGNOSIS — R270 Ataxia, unspecified: Secondary | ICD-10-CM | POA: Diagnosis present

## 2021-02-16 DIAGNOSIS — R29898 Other symptoms and signs involving the musculoskeletal system: Secondary | ICD-10-CM

## 2021-02-16 HISTORY — DX: Cerebral infarction, unspecified: I63.9

## 2021-02-16 LAB — COMPREHENSIVE METABOLIC PANEL
ALT: 17 U/L (ref 0–44)
AST: 25 U/L (ref 15–41)
Albumin: 3.8 g/dL (ref 3.5–5.0)
Alkaline Phosphatase: 99 U/L (ref 38–126)
Anion gap: 12 (ref 5–15)
BUN: 7 mg/dL — ABNORMAL LOW (ref 8–23)
CO2: 27 mmol/L (ref 22–32)
Calcium: 9.2 mg/dL (ref 8.9–10.3)
Chloride: 98 mmol/L (ref 98–111)
Creatinine, Ser: 0.88 mg/dL (ref 0.61–1.24)
GFR, Estimated: >60 mL/min (ref >60)
Glucose, Bld: 181 mg/dL — ABNORMAL HIGH (ref 70–99)
Potassium: 3.1 mmol/L — ABNORMAL LOW (ref 3.5–5.1)
Sodium: 137 mmol/L (ref 135–145)
Total Bilirubin: 1.5 mg/dL — ABNORMAL HIGH (ref 0.3–1.2)
Total Protein: 7.8 g/dL (ref 6.5–8.1)

## 2021-02-16 LAB — MAGNESIUM: Magnesium: 1.8 mg/dL (ref 1.7–2.4)

## 2021-02-16 LAB — DIFFERENTIAL
Abs Immature Granulocytes: 0.06 10*3/uL (ref 0.00–0.07)
Basophils Absolute: 0.1 10*3/uL (ref 0.0–0.1)
Basophils Relative: 0 %
Eosinophils Absolute: 0 10*3/uL (ref 0.0–0.5)
Eosinophils Relative: 0 %
Immature Granulocytes: 1 %
Lymphocytes Relative: 20 %
Lymphs Abs: 2.4 10*3/uL (ref 0.7–4.0)
Monocytes Absolute: 1.5 10*3/uL — ABNORMAL HIGH (ref 0.1–1.0)
Monocytes Relative: 12 %
Neutro Abs: 8.4 10*3/uL — ABNORMAL HIGH (ref 1.7–7.7)
Neutrophils Relative %: 67 %

## 2021-02-16 LAB — CBC
HCT: 49.8 % (ref 39.0–52.0)
Hemoglobin: 16.8 g/dL (ref 13.0–17.0)
MCH: 30.9 pg (ref 26.0–34.0)
MCHC: 33.7 g/dL (ref 30.0–36.0)
MCV: 91.5 fL (ref 80.0–100.0)
Platelets: 341 10*3/uL (ref 150–400)
RBC: 5.44 MIL/uL (ref 4.22–5.81)
RDW: 16.1 % — ABNORMAL HIGH (ref 11.5–15.5)
WBC: 12.4 10*3/uL — ABNORMAL HIGH (ref 4.0–10.5)
nRBC: 0 % (ref 0.0–0.2)

## 2021-02-16 LAB — I-STAT CHEM 8, ED
BUN: 7 mg/dL — ABNORMAL LOW (ref 8–23)
Calcium, Ion: 1.06 mmol/L — ABNORMAL LOW (ref 1.15–1.40)
Chloride: 98 mmol/L (ref 98–111)
Creatinine, Ser: 0.7 mg/dL (ref 0.61–1.24)
Glucose, Bld: 187 mg/dL — ABNORMAL HIGH (ref 70–99)
HCT: 57 % — ABNORMAL HIGH (ref 39.0–52.0)
Hemoglobin: 19.4 g/dL — ABNORMAL HIGH (ref 13.0–17.0)
Potassium: 3.1 mmol/L — ABNORMAL LOW (ref 3.5–5.1)
Sodium: 141 mmol/L (ref 135–145)
TCO2: 34 mmol/L — ABNORMAL HIGH (ref 22–32)

## 2021-02-16 LAB — PROTIME-INR
INR: 1.2 (ref 0.8–1.2)
Prothrombin Time: 15 seconds (ref 11.4–15.2)

## 2021-02-16 LAB — CK: Total CK: 574 U/L — ABNORMAL HIGH (ref 49–397)

## 2021-02-16 LAB — CBG MONITORING, ED: Glucose-Capillary: 188 mg/dL — ABNORMAL HIGH (ref 70–99)

## 2021-02-16 LAB — RESP PANEL BY RT-PCR (FLU A&B, COVID) ARPGX2
Influenza A by PCR: NEGATIVE
Influenza B by PCR: NEGATIVE
SARS Coronavirus 2 by RT PCR: NEGATIVE

## 2021-02-16 LAB — TROPONIN I (HIGH SENSITIVITY): Troponin I (High Sensitivity): 37 ng/L — ABNORMAL HIGH (ref <18)

## 2021-02-16 LAB — APTT: aPTT: 32 seconds (ref 24–36)

## 2021-02-16 MED ORDER — IOHEXOL 350 MG/ML SOLN
75.0000 mL | Freq: Once | INTRAVENOUS | Status: AC | PRN
  Administered 2021-02-16: 21:00:00 75 mL via INTRAVENOUS

## 2021-02-16 MED ORDER — LACTATED RINGERS IV BOLUS
500.0000 mL | Freq: Once | INTRAVENOUS | Status: AC
  Administered 2021-02-17: 01:00:00 500 mL via INTRAVENOUS

## 2021-02-16 MED ORDER — ACETAMINOPHEN 500 MG PO TABS
1000.0000 mg | ORAL_TABLET | Freq: Once | ORAL | Status: DC
  Filled 2021-02-16: qty 2

## 2021-02-16 MED ORDER — SODIUM CHLORIDE 0.9% FLUSH
3.0000 mL | Freq: Once | INTRAVENOUS | Status: AC
  Administered 2021-02-16: 21:00:00 3 mL via INTRAVENOUS

## 2021-02-16 NOTE — Consult Note (Signed)
NEUROLOGY CONSULTATION NOTE   Date of service: February 16, 2021 Patient Name: Brent Young MRN:  [id number] DOB:  [birth year] Reason for consult: "left sided weakness" Requesting Provider: Hayden Rasmussen, MD _ _ _   _ __   _ __ _ _  __ __   _ __   __ _  History of Present Illness  Brent Young is a 60 y.o. 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 He went to bed at 2000 on 02/15/21 and woke up at 0700 on 02/16/21 with left arm and leg weakness. He rolled out of the bed and fell on the floor. Was unable to get up. Family came in later in the day and EMS called. He has been on the floor all day.  mRS: 0 tNKase: outside window Thrombectomy: just outside window by the time he arrived. LKW: 2000 on 02/15/21. NIHSS components Score: Comment  1a Level of Conscious 0[x]  1[]  2[]  3[]      1b LOC Questions 0[]  1[x]  2[]       1c LOC Commands 0[x]  1[]  2[]       2 Best Gaze 0[x]  1[]  2[]       3 Visual 0[x]  1[]  2[]  3[]      4 Facial Palsy 0[x]  1[]  2[]  3[]      5a Motor Arm - left 0[]  1[x]  2[]  3[]  4[]  UN[]    5b Motor Arm - Right 0[x]  1[]  2[]  3[]  4[]  UN[]    6a Motor Leg - Left 0[]  1[]  2[]  3[x]  4[]  UN[]    6b Motor Leg - Right 0[x]  1[]  2[]  3[]  4[]  UN[]    7 Limb Ataxia 0[]  1[x]  2[]  3[]  UN[]     8 Sensory 0[]  1[x]  2[]  UN[]      9 Best Language 0[x]  1[]  2[]  3[]      10 Dysarthria 0[x]  1[]  2[]  UN[]      11 Extinct. and Inattention 0[x]  1[]  2[]       TOTAL: 7      ROS   Constitutional Denies weight loss, fever and chills.   HEENT Denies changes in vision and hearing.   Respiratory Denies SOB and cough.   CV Denies palpitations and CP   GI Denies abdominal pain, nausea, vomiting and diarrhea.   GU Denies dysuria and urinary frequency.   MSK Denies myalgia and joint pain.   Skin Denies rash and pruritus.   Neurological Denies headache and syncope.   Psychiatric Denies  recent changes in mood. Denies anxiety and depression.    Past History   Past Medical History:  Diagnosis Date   Allergy    Arthritis    Diabetes mellitus without complication ([place])    Hypertension    Past Surgical History:  Procedure Laterality Date   FRACTURE SURGERY Left    Patient fractured left arm   Family History  Problem Relation Age of Onset   Colon cancer Maternal Grandmother    Esophageal cancer Neg Hx    Stomach cancer Neg Hx    Rectal cancer Neg Hx    Social History   Socioeconomic History   Marital status: Married    Spouse name: Not on file   Number of children: Not on file   Years of education: Not on file   Highest education level: Not on file  Occupational History   Not on file  Tobacco Use   Smoking status: Never  Smokeless tobacco: Never  Vaping Use   Vaping Use: Never used  Substance and Sexual Activity   Alcohol use: Yes    Comment: occasionally   Drug use: No   Sexual activity: Not on file  Other Topics Concern   Not on file  Social History Narrative   Not on file   Social Determinants of Health   Financial Resource Strain: Not on file  Food Insecurity: Not on file  Transportation Needs: Not on file  Physical Activity: Not on file  Stress: Not on file  Social Connections: Not on file   No Known Allergies  Medications  (Not in a hospital admission)    Vitals   There were no vitals filed for this visit.   There is no height or weight on file to calculate BMI.  Physical Exam   General: Laying comfortably in bed; in no acute distress.  HENT: Normal oropharynx and mucosa. Normal external appearance of ears and nose.  Neck: Supple, no pain or tenderness  CV: No JVD. No peripheral edema.  Pulmonary: Symmetric Chest rise. Normal respiratory effort.  Abdomen: Soft to touch, non-tender.  Ext: No cyanosis, edema, or deformity  Skin: No rash. Normal palpation of skin.   Musculoskeletal: Normal digits and nails by inspection.  No clubbing.   Neurologic Examination  Mental status/Cognition: Alert, oriented to self, place, but not to month. Oriented to year thou, good attention.  Speech/language: Fluent, comprehension intact, object naming intact, repetition intact.  Cranial nerves:   CN II Pupils equal and reactive to light, no VF deficits    CN III,IV,VI EOM intact, no gaze preference or deviation, no nystagmus    CN V normal sensation in V1, V2, and V3 segments bilaterally   CN VII no asymmetry, no nasolabial fold flattening   CN VIII normal hearing to speech   CN IX & X normal palatal elevation, no uvular deviation   CN XI 5/5 head turn and 5/5 shoulder shrug bilaterally   CN XII midline tongue protrusion   Motor:  Muscle bulk: normal, tone normal, tremor none Mvmt Root Nerve  Muscle Right Left Comments  SA C5/6 Ax Deltoid 5 4+   EF C5/6 Mc Biceps 5 4+   EE C6/7/8 Rad Triceps 5 4+   WF C6/7 Med FCR     WE C7/8 PIN ECU     F Ab C8/T1 U ADM/FDI 5 4   HF L1/2/3 Fem Illopsoas 5 0   KE L2/3/4 Fem Quad 5 0   DF L4/5 D Peron Tib Ant 5 0   PF S1/2 Tibial Grc/Sol 5 0    Reflexes:  Right Left Comments  Pectoralis      Biceps (C5/6)     Brachioradialis (C5/6)      Triceps (C6/7)      Patellar (L3/4)      Achilles (S1)      Hoffman      Plantar     Jaw jerk    Sensation:  Light touch Decreased in RUE to touch   Pin prick    Temperature    Vibration   Proprioception    Coordination/Complex Motor:  - Finger to Nose with ataxia and dysmettria in LUE - Heel to shin unable to do given the extent of the weakness. - Rapid alternating movement are slowed in LUE - Gait: unsafe to assess as he is plegic in LLE now.  Labs   CBC: No results for input(s): WBC, NEUTROABS, HGB, HCT, MCV,  PLT in the last 168 hours.  Basic Metabolic Panel:  Lab Results  Component Value Date   NA 136 04/16/2020   K 3.9 04/16/2020   CO2 23 04/16/2020   GLUCOSE 331 (H) 04/16/2020   BUN 10 04/16/2020   CREATININE 0.80  04/16/2020   CALCIUM 9.7 04/16/2020   GFRNONAA 97 04/16/2020   GFRAA 112 04/16/2020   Lipid Panel:  Lab Results  Component Value Date   LDLCALC 75 10/08/2020   HgbA1c:  Lab Results  Component Value Date   HGBA1C 9.1 (A) 10/08/2020   Urine Drug Screen: No results found for: LABOPIA, COCAINSCRNUR, LABBENZ, AMPHETMU, THCU, LABBARB  Alcohol Level No results found for: ETH  CT Head without contrast(Personally reviewed): CTH was negative for a large hypodensity concerning for a large territory infarct or hyperdensity concerning for an ICH.   Impression   Brent Young is a 60 y.o. male with PMH significant for DM2, HTN, prior lacunar strokes, hx of L BG and hippocampal ICH who presents with LUE and LLE weakness. High suspicion for stroke.His neurologic examination is notable for LUE ataxia and LLE plegia.  Primary Diagnosis:  Cerebral infarction, unspecified.  Secondary Diagnosis: Essential (primary) hypertension and Type 2 diabetes mellitus w/o complications  Recommendations  Plan:  Recommend that primary team order following: - Frequent Neuro checks per stroke unit protocol - Recommend brain imaging with MRI Brain without contrast - Recommend obtaining TTE - Recommend obtaining Lipid panel with LDL - Please start statin if LDL > 70 - Recommend HbA1c - Antithrombotic - Aspirin 81mg  daily. - Recommend DVT ppx - SBP goal - permissive hypertension first 24 h < 220/110. Held home meds.  - Recommend Telemetry monitoring for arrythmia - Recommend bedside swallow screen prior to PO intake. - Stroke education booklet - Recommend PT/OT/SLP consult - Recommend Urine Tox screen.  ______________________________________________________________________  Plan discussed with Dr. Melina Copa with the ED team over secure chat.  Thank you for the opportunity to take part in the care of this patient. If you have any further questions, please contact the neurology consultation  attending.  Signed,  [place] Pager Number [phone] _ _ _   _ __   _ __ _ _  __ __   _ __   __ _

## 2021-02-16 NOTE — ED Triage Notes (Signed)
Pt here via [facility] for L sided weakness.  LSN 8 pm yesterday when he went to bed.  Pt fell while getting out of bed this am.  Laid on the floor all day until family came home at 1900.  Ao x 4.  Vs per [place]:  195/109 93% ra Hr 115 Cbg 115

## 2021-02-16 NOTE — Code Documentation (Signed)
Responded to Code Stroke called at 1954 for L sided weakness, LSN-2000 yesterday. Pt arrived at 2004, CBG-188, NIH-7, CT head negative for acute changes. TNK not given-pt outside window. Plan MRI/stroke workup.

## 2021-02-17 ENCOUNTER — Inpatient Hospital Stay (HOSPITAL_COMMUNITY): Payer: Medicaid Other

## 2021-02-17 ENCOUNTER — Encounter (HOSPITAL_COMMUNITY): Payer: Self-pay | Admitting: Family Medicine

## 2021-02-17 DIAGNOSIS — I679 Cerebrovascular disease, unspecified: Secondary | ICD-10-CM

## 2021-02-17 DIAGNOSIS — R1310 Dysphagia, unspecified: Secondary | ICD-10-CM | POA: Diagnosis present

## 2021-02-17 DIAGNOSIS — R471 Dysarthria and anarthria: Secondary | ICD-10-CM | POA: Diagnosis present

## 2021-02-17 DIAGNOSIS — R0600 Dyspnea, unspecified: Secondary | ICD-10-CM | POA: Diagnosis not present

## 2021-02-17 DIAGNOSIS — I6329 Cerebral infarction due to unspecified occlusion or stenosis of other precerebral arteries: Secondary | ICD-10-CM | POA: Diagnosis present

## 2021-02-17 DIAGNOSIS — I63531 Cerebral infarction due to unspecified occlusion or stenosis of right posterior cerebral artery: Secondary | ICD-10-CM

## 2021-02-17 DIAGNOSIS — I639 Cerebral infarction, unspecified: Secondary | ICD-10-CM | POA: Diagnosis present

## 2021-02-17 DIAGNOSIS — I700 Atherosclerosis of aorta: Secondary | ICD-10-CM | POA: Diagnosis present

## 2021-02-17 DIAGNOSIS — R270 Ataxia, unspecified: Secondary | ICD-10-CM | POA: Diagnosis present

## 2021-02-17 DIAGNOSIS — E669 Obesity, unspecified: Secondary | ICD-10-CM | POA: Diagnosis present

## 2021-02-17 DIAGNOSIS — E785 Hyperlipidemia, unspecified: Secondary | ICD-10-CM | POA: Diagnosis present

## 2021-02-17 DIAGNOSIS — Z8673 Personal history of transient ischemic attack (TIA), and cerebral infarction without residual deficits: Secondary | ICD-10-CM

## 2021-02-17 DIAGNOSIS — M19011 Primary osteoarthritis, right shoulder: Secondary | ICD-10-CM | POA: Diagnosis present

## 2021-02-17 DIAGNOSIS — E876 Hypokalemia: Secondary | ICD-10-CM | POA: Diagnosis present

## 2021-02-17 DIAGNOSIS — Z6836 Body mass index (BMI) 36.0-36.9, adult: Secondary | ICD-10-CM | POA: Diagnosis not present

## 2021-02-17 DIAGNOSIS — I119 Hypertensive heart disease without heart failure: Secondary | ICD-10-CM | POA: Diagnosis present

## 2021-02-17 DIAGNOSIS — I6782 Cerebral ischemia: Secondary | ICD-10-CM | POA: Diagnosis present

## 2021-02-17 DIAGNOSIS — J811 Chronic pulmonary edema: Secondary | ICD-10-CM | POA: Diagnosis not present

## 2021-02-17 DIAGNOSIS — E1165 Type 2 diabetes mellitus with hyperglycemia: Secondary | ICD-10-CM | POA: Diagnosis present

## 2021-02-17 DIAGNOSIS — R9431 Abnormal electrocardiogram [ECG] [EKG]: Secondary | ICD-10-CM

## 2021-02-17 DIAGNOSIS — I672 Cerebral atherosclerosis: Secondary | ICD-10-CM | POA: Diagnosis present

## 2021-02-17 DIAGNOSIS — G8194 Hemiplegia, unspecified affecting left nondominant side: Secondary | ICD-10-CM | POA: Diagnosis present

## 2021-02-17 DIAGNOSIS — F17210 Nicotine dependence, cigarettes, uncomplicated: Secondary | ICD-10-CM | POA: Diagnosis present

## 2021-02-17 DIAGNOSIS — R778 Other specified abnormalities of plasma proteins: Secondary | ICD-10-CM

## 2021-02-17 DIAGNOSIS — Z20822 Contact with and (suspected) exposure to covid-19: Secondary | ICD-10-CM | POA: Diagnosis present

## 2021-02-17 DIAGNOSIS — F1290 Cannabis use, unspecified, uncomplicated: Secondary | ICD-10-CM | POA: Diagnosis present

## 2021-02-17 DIAGNOSIS — K5900 Constipation, unspecified: Secondary | ICD-10-CM | POA: Diagnosis present

## 2021-02-17 DIAGNOSIS — E1169 Type 2 diabetes mellitus with other specified complication: Secondary | ICD-10-CM

## 2021-02-17 DIAGNOSIS — I6389 Other cerebral infarction: Secondary | ICD-10-CM

## 2021-02-17 DIAGNOSIS — I69354 Hemiplegia and hemiparesis following cerebral infarction affecting left non-dominant side: Secondary | ICD-10-CM | POA: Diagnosis not present

## 2021-02-17 DIAGNOSIS — D72829 Elevated white blood cell count, unspecified: Secondary | ICD-10-CM | POA: Diagnosis present

## 2021-02-17 DIAGNOSIS — W06XXXA Fall from bed, initial encounter: Secondary | ICD-10-CM | POA: Diagnosis present

## 2021-02-17 DIAGNOSIS — I248 Other forms of acute ischemic heart disease: Secondary | ICD-10-CM | POA: Diagnosis present

## 2021-02-17 DIAGNOSIS — I10 Essential (primary) hypertension: Secondary | ICD-10-CM

## 2021-02-17 DIAGNOSIS — R29707 NIHSS score 7: Secondary | ICD-10-CM | POA: Diagnosis present

## 2021-02-17 LAB — URINALYSIS, ROUTINE W REFLEX MICROSCOPIC
Bilirubin Urine: NEGATIVE
Glucose, UA: 50 mg/dL — AB
Hgb urine dipstick: NEGATIVE
Ketones, ur: 20 mg/dL — AB
Leukocytes,Ua: NEGATIVE
Nitrite: NEGATIVE
Protein, ur: 100 mg/dL — AB
Specific Gravity, Urine: >1.046 — ABNORMAL HIGH (ref 1.005–1.030)
pH: 5 (ref 5.0–8.0)

## 2021-02-17 LAB — HEMOGLOBIN A1C
Hgb A1c MFr Bld: 9.9 % — ABNORMAL HIGH (ref 4.8–5.6)
Mean Plasma Glucose: 237 mg/dL

## 2021-02-17 LAB — RAPID URINE DRUG SCREEN, HOSP PERFORMED
Amphetamines: NOT DETECTED
Barbiturates: NOT DETECTED
Benzodiazepines: NOT DETECTED
Cocaine: NOT DETECTED
Opiates: NOT DETECTED
Tetrahydrocannabinol: POSITIVE — AB

## 2021-02-17 LAB — CBG MONITORING, ED
Glucose-Capillary: 183 mg/dL — ABNORMAL HIGH (ref 70–99)
Glucose-Capillary: 157 mg/dL — ABNORMAL HIGH (ref 70–99)

## 2021-02-17 LAB — TROPONIN I (HIGH SENSITIVITY)
Troponin I (High Sensitivity): 55 ng/L — ABNORMAL HIGH (ref <18)
Troponin I (High Sensitivity): 46 ng/L — ABNORMAL HIGH (ref <18)
Troponin I (High Sensitivity): 41 ng/L — ABNORMAL HIGH (ref <18)

## 2021-02-17 LAB — LIPID PANEL
Cholesterol: 148 mg/dL (ref 0–200)
HDL: 26 mg/dL — ABNORMAL LOW (ref >40)
LDL Cholesterol: 98 mg/dL (ref 0–99)
Total CHOL/HDL Ratio: 5.7 RATIO
Triglycerides: 120 mg/dL (ref <150)
VLDL: 24 mg/dL (ref 0–40)

## 2021-02-17 LAB — ECHOCARDIOGRAM COMPLETE
AR max vel: 1.96 cm2
AV Area VTI: 1.9 cm2
AV Area mean vel: 1.94 cm2
AV Mean grad: 12.5 mmHg
AV Peak grad: 23.7 mmHg
Ao pk vel: 2.44 m/s
Height: 73 in
S' Lateral: 3.3 cm
Weight: 4400 oz

## 2021-02-17 LAB — HIV ANTIBODY (ROUTINE TESTING W REFLEX): HIV Screen 4th Generation wRfx: NONREACTIVE

## 2021-02-17 MED ORDER — DIAZEPAM 5 MG/ML IJ SOLN
5.0000 mg | Freq: Once | INTRAMUSCULAR | Status: AC
  Administered 2021-02-17: 05:00:00 5 mg via INTRAVENOUS
  Filled 2021-02-17: qty 2

## 2021-02-17 MED ORDER — ACETAMINOPHEN 160 MG/5ML PO SOLN: 650.0000 mg | ORAL | Status: DC | PRN

## 2021-02-17 MED ORDER — GABAPENTIN 300 MG PO CAPS
300.0000 mg | ORAL_CAPSULE | Freq: Three times a day (TID) | ORAL | Status: DC
  Administered 2021-02-18 – 2021-02-27 (×28): 300 mg via ORAL
  Filled 2021-02-17 (×29): qty 1

## 2021-02-17 MED ORDER — SODIUM CHLORIDE 0.9 % IV SOLN: INTRAVENOUS | Status: DC

## 2021-02-17 MED ORDER — LINAGLIPTIN 5 MG PO TABS
5.0000 mg | ORAL_TABLET | Freq: Every day | ORAL | Status: DC
  Administered 2021-02-18 – 2021-02-27 (×10): 5 mg via ORAL
  Filled 2021-02-17 (×10): qty 1

## 2021-02-17 MED ORDER — PERFLUTREN LIPID MICROSPHERE
1.0000 mL | INTRAVENOUS | Status: AC | PRN
Start: 2021-02-17 — End: 2021-02-17
  Filled 2021-02-17: qty 10

## 2021-02-17 MED ORDER — POTASSIUM CHLORIDE 10 MEQ/100ML IV SOLN
10.0000 meq | INTRAVENOUS | Status: AC
  Administered 2021-02-17 (×3): 10 meq via INTRAVENOUS
  Filled 2021-02-17 (×2): qty 100

## 2021-02-17 MED ORDER — STROKE: EARLY STAGES OF RECOVERY BOOK: Freq: Once | Status: DC

## 2021-02-17 MED ORDER — ATORVASTATIN CALCIUM 40 MG PO TABS
40.0000 mg | ORAL_TABLET | Freq: Every day | ORAL | Status: DC
  Administered 2021-02-18 – 2021-02-27 (×10): 40 mg via ORAL
  Filled 2021-02-17 (×10): qty 1

## 2021-02-17 MED ORDER — ASPIRIN EC 325 MG PO TBEC
325.0000 mg | DELAYED_RELEASE_TABLET | Freq: Every day | ORAL | Status: DC
  Administered 2021-02-18: 325 mg via ORAL
  Filled 2021-02-17: qty 1

## 2021-02-17 MED ORDER — POTASSIUM CHLORIDE 10 MEQ/100ML IV SOLN
INTRAVENOUS | Status: AC
  Administered 2021-02-17: 09:00:00 10 meq via INTRAVENOUS
  Filled 2021-02-17: qty 100

## 2021-02-17 MED ORDER — INSULIN ASPART 100 UNIT/ML IJ SOLN: 0.0000 [IU] | Freq: Four times a day (QID) | INTRAMUSCULAR | Status: DC | PRN

## 2021-02-17 MED ORDER — ACETAMINOPHEN 650 MG RE SUPP: 650.0000 mg | RECTAL | Status: DC | PRN

## 2021-02-17 MED ORDER — KETOROLAC TROMETHAMINE 15 MG/ML IJ SOLN
15.0000 mg | Freq: Four times a day (QID) | INTRAMUSCULAR | Status: AC | PRN
  Administered 2021-02-17 – 2021-02-20 (×4): 15 mg via INTRAVENOUS
  Filled 2021-02-17 (×4): qty 1

## 2021-02-17 MED ORDER — OXCARBAZEPINE 300 MG PO TABS
300.0000 mg | ORAL_TABLET | Freq: Three times a day (TID) | ORAL | Status: DC
  Administered 2021-02-18 – 2021-02-27 (×18): 300 mg via ORAL
  Filled 2021-02-17 (×26): qty 1

## 2021-02-17 MED ORDER — ACETAMINOPHEN 325 MG PO TABS
650.0000 mg | ORAL_TABLET | ORAL | Status: DC | PRN
  Administered 2021-02-18 – 2021-02-20 (×2): 650 mg via ORAL
  Filled 2021-02-17 (×3): qty 2

## 2021-02-17 MED ORDER — ASPIRIN 300 MG RE SUPP: 300.0000 mg | Freq: Every day | RECTAL | Status: DC

## 2021-02-17 MED ORDER — HYDRALAZINE HCL 25 MG PO TABS
25.0000 mg | ORAL_TABLET | Freq: Four times a day (QID) | ORAL | Status: DC | PRN
  Administered 2021-02-18 – 2021-02-22 (×8): 25 mg via ORAL
  Filled 2021-02-17 (×6): qty 1

## 2021-02-17 NOTE — ED Notes (Signed)
Patient remains in MRI 

## 2021-02-17 NOTE — ED Notes (Signed)
PT began drinking water to take tylenol.  Began choking.  2nd swallow eval - pt able to swallow better without straw, but still chokes while drinking with straw.  PT eval swallow ordered per MD.

## 2021-02-17 NOTE — Consult Note (Signed)
Chief Complaint: Patient was seen in consultation today for diagnostic cerebral angiogram  Referring Physician(s): Marvel Plan  Supervising Physician: Baldemar Lenis  Patient Status: [facility] - ED  History of Present Illness: Brent Young is a 60 y.o. male with a past medical history significant for arthritis, DM, HTN, previous lacunar strokes who presented to [facility] ED on 02/16/21 with complaints of left arm and leg weakness. Brent Young reported he had gone to bed without symptoms and then when he awoke around 7:00 am on 11/27 he fell out of bed and was unable to get up because of the weakness of his left side, he was found later that day by family and EMS was called. Code stroke was initiated at [facility] and he underwent CT/CTA head/neck which showed:  1. Poor opacification of the right vertebral artery throughout its course, with increased focal stenosis just distal to its take-off. The right V4 is likely occluded proximal to the takeoff of the right PICA, with no definitive flow seen in the right PICA, which is new from prior exam. 2. Redemonstrated moderate to severe calcified plaque in the carotid siphons, right greater than left, overall unchanged. 3. 65-70% stenosis of the proximal left ICA, secondary to calcified and noncalcified plaque.  MRI brain today shows: 1. Acute to subacute perforator infarcts at the right basal ganglia and corona radiata. 2. Extensive chronic small vessel ischemia  IR has been consulted for diagnostic cerebral angiogram to further evaluate these imaging findings.  Patient seen in ED, he denies complaints currently except that he is hungry. He states he does not want to have another CT scan and that he "already did a dye test" (referring to CT) so he doesn't think he wants to do another one but he will consider it. He still feels weak on the left side but denies any other neurologic complaints.  Past Medical History:  Diagnosis Date    Allergy    Arthritis    Diabetes mellitus without complication (HCC)    Hypertension    Stroke [facility])     Past Surgical History:  Procedure Laterality Date   FRACTURE SURGERY Left    Patient fractured left arm    Allergies: Patient has no known allergies.  Medications: Prior to Admission medications   Medication Sig Start Date End Date Taking? Authorizing Provider  amLODipine (NORVASC) 10 MG tablet Take 1 tablet (10 mg total) by mouth daily. 05/28/20  Yes Grayce Sessions, NP  atorvastatin (LIPITOR) 10 MG tablet Take 1 tablet (10 mg total) by mouth daily. 05/28/20  Yes Grayce Sessions, NP  ergocalciferol (VITAMIN D2) 1.25 MG (50000 UT) capsule Take 50,000 Units by mouth every Wednesday. 01/03/21  Yes [provider]  gabapentin (NEURONTIN) 300 MG capsule TAKE 1 CAPSULE BY MOUTH THREE TIMES A DAY Patient taking differently: Take 300 mg by mouth 3 (three) times daily. 12/16/20  Yes Grayce Sessions, NP  glimepiride (AMARYL) 4 MG tablet Take 1 tablet (4 mg total) by mouth every morning. 10/08/20  Yes Grayce Sessions, NP  hydrochlorothiazide (HYDRODIURIL) 25 MG tablet Take 1 tablet (25 mg total) by mouth daily. 06/28/20  Yes Grayce Sessions, NP  losartan (COZAAR) 25 MG tablet Take 1 tablet (25 mg total) by mouth daily. 05/28/20  Yes Grayce Sessions, NP  metFORMIN (GLUCOPHAGE) 1000 MG tablet Take 1 tablet (1,000 mg total) by mouth 2 (two) times daily with a meal. 05/28/20  Yes Grayce Sessions, NP  metoprolol  succinate (TOPROL-XL) 25 MG 24 hr tablet Take 25 mg by mouth 3 (three) times daily. 12/30/20  Yes [provider]  Oxcarbazepine (TRILEPTAL) 300 MG tablet Take 1 tablet (300 mg total) by mouth 3 (three) times daily. 02/04/20  Yes Grayce Sessions, NP  sitaGLIPtin (JANUVIA) 50 MG tablet Take 1 tablet (50 mg total) by mouth daily. 10/08/20  Yes Grayce Sessions, NP     Family History  Problem Relation Age of Onset   Colon cancer Maternal Grandmother     Esophageal cancer Neg Hx    Stomach cancer Neg Hx    Rectal cancer Neg Hx     Social History   Socioeconomic History   Marital status: Married    Spouse name: Not on file   Number of children: Not on file   Years of education: Not on file   Highest education level: Not on file  Occupational History   Not on file  Tobacco Use   Smoking status: Some Days    Types: Cigarettes   Smokeless tobacco: Never  Vaping Use   Vaping Use: Never used  Substance and Sexual Activity   Alcohol use: Yes    Comment: occasionally   Drug use: Yes    Types: Marijuana   Sexual activity: Not on file  Other Topics Concern   Not on file  Social History Narrative   Not on file   Social Determinants of Health   Financial Resource Strain: Not on file  Food Insecurity: Not on file  Transportation Needs: Not on file  Physical Activity: Not on file  Stress: Not on file  Social Connections: Not on file     Review of Systems: A 12 point ROS discussed and pertinent positives are indicated in the HPI above.  All other systems are negative.  Review of Systems  Constitutional:  Negative for chills and fever.  Respiratory:  Negative for cough and shortness of breath.   Cardiovascular:  Negative for chest pain.  Gastrointestinal:  Negative for abdominal pain, nausea and vomiting.  Neurological:  Positive for weakness (left leg and arm). Negative for dizziness, light-headedness, numbness and headaches.   Vital Signs: BP (!) 164/111   Pulse (!) 101   Temp 98.4 F (36.9 C) (Oral)   Resp 18   Ht 6\' 1"  (1.854 m)   Wt 275 lb (124.7 kg)   SpO2 92%   BMI 36.28 kg/m   Physical Exam Vitals and nursing note reviewed.  Constitutional:      General: He is not in acute distress. HENT:     Head: Normocephalic.  Cardiovascular:     Rate and Rhythm: Regular rhythm. Tachycardia present.  Pulmonary:     Effort: Pulmonary effort is normal.     Breath sounds: Normal breath sounds.  Abdominal:      General: There is no distension.     Palpations: Abdomen is soft.     Tenderness: There is no abdominal tenderness.  Skin:    General: Skin is warm and dry.  Neurological:     Mental Status: He is alert.  Alert, awake, and oriented x 4 Speech and comprehension in tact Visual fields grossly in tact No obvious facial asymmetry. Tongue midline Motor power - full RUE/RLE, 0/5 LLE (cannot lift leg at all per patient) and 3-4/5 LUE (poor participation in exam, patient distracted by TV/cell phone)  Imaging: CT ANGIO HEAD W OR WO CONTRAST  Result Date: 02/16/2021 CLINICAL DATA:  Left-sided weakness EXAM: CT  ANGIOGRAPHY HEAD AND NECK TECHNIQUE: Multidetector CT imaging of the head and neck was performed using the standard protocol during bolus administration of intravenous contrast. Multiplanar CT image reconstructions and MIPs were obtained to evaluate the vascular anatomy. Carotid stenosis measurements (when applicable) are obtained utilizing NASCET criteria, using the distal internal carotid diameter as the denominator. CONTRAST:  19mL OMNIPAQUE IOHEXOL 350 MG/ML SOLN COMPARISON:  02/28/2020, correlation is made with same day CT head FINDINGS: CT HEAD FINDINGS For noncontrast findings, please see same day CT head. CTA NECK FINDINGS Aortic arch: Two-vessel arch with a common origin of the brachiocephalic and left common carotid arteries. Imaged portion shows no evidence of aneurysm or dissection. No significant stenosis of the major arch vessel origins. Moderate calcified and noncalcified plaque. Right carotid system: No evidence of dissection, stenosis (50% or greater) or occlusion. Mild narrowing at the bifurcation, secondary to calcified and noncalcified plaque, which is not hemodynamically significant. Left carotid system: 65-70% stenosis of the proximal left ICA, secondary to calcified and noncalcified plaque. No evidence of dissection or occlusion. Vertebral arteries: Severe stenosis at the origin  of the right vertebral artery (series 8, images 295-300), which appears worse than on the prior exam. Minimal flow in the right V2 and V3 segments. The extracranial left vertebral artery is without significant stenosis. Skeleton: No acute osseous abnormality. Degenerative changes in the cervical spine. Other neck: Negative. Upper chest: Mild ground-glass opacities, which are nonspecific may represent pulmonary edema. No pleural effusion. Unchanged prominent mediastinal lymph nodes. Review of the MIP images confirms the above findings CTA HEAD FINDINGS Anterior circulation: Redemonstrated moderate to severe calcified plaque in the carotid siphons, right greater than left, overall unchanged. A1 segments patent. Normal anterior communicating artery. Anterior cerebral arteries are patent to their distal aspects. No M1 stenosis or occlusion. Normal MCA bifurcations. Distal MCA branches perfused and symmetric. Posterior circulation: Focal plaque in the left V4, which is otherwise patent. The diminutive right V4 is poorly opacified intracranially, with near complete occlusion just proximal to the take-off of the right PICA and only intermittent faint flow to the vertebrobasilar junction, some of which may be retrograde. The right PICA is not opacified. The left PICA is patent. Basilar patent to its distal aspect. Superior cerebellar arteries patent bilaterally. PCAs perfused to their distal aspects without stenosis. The bilateral posterior communicating arteries are not visualized Venous sinuses: As permitted by contrast timing, patent. Anatomic variants: None significant Review of the MIP images confirms the above findings IMPRESSION: 1. Poor opacification of the right vertebral artery throughout its course, with increased focal stenosis just distal to its take-off. The right V4 is likely occluded proximal to the takeoff of the right PICA, with no definitive flow seen in the right PICA, which is new from prior exam. 2.  Redemonstrated moderate to severe calcified plaque in the carotid siphons, right greater than left, overall unchanged. 3. 65-70% stenosis of the proximal left ICA, secondary to calcified and noncalcified plaque. Electronically Signed   By: Wiliam Ke M.D.   On: 02/16/2021 21:08   DG Chest 1 View  Result Date: 02/16/2021 CLINICAL DATA:  Status post fall with left-sided weakness. EXAM: CHEST  1 VIEW COMPARISON:  December 26, 2020 FINDINGS: Mild to moderate severity, predominant stable areas of scarring and/or atelectasis are noted within the bilateral lung bases there is no evidence of a pleural effusion or pneumothorax. The cardiac silhouette is mildly enlarged and unchanged in size. The visualized skeletal structures are unremarkable. IMPRESSION: Stable cardiomegaly with  mild to moderate severity, predominant stable areas of bibasilar scarring and/or atelectasis. Electronically Signed   By: Aram Candela M.D.   On: 02/16/2021 23:51   DG Shoulder Right  Result Date: 02/16/2021 CLINICAL DATA:  Fall. EXAM: RIGHT SHOULDER - 2+ VIEW COMPARISON:  Right shoulder x-ray 05/02/2020 FINDINGS: Healed greater tuberosity fracture is present. No definite acute fracture or dislocation. There are stable mild degenerative changes of the acromioclavicular joint. There are vascular calcifications in the soft tissues. IMPRESSION: 1. No acute bony abnormality. Electronically Signed   By: Darliss Cheney M.D.   On: 02/16/2021 23:38   CT ANGIO NECK W OR WO CONTRAST  Result Date: 02/16/2021 CLINICAL DATA:  Left-sided weakness EXAM: CT ANGIOGRAPHY HEAD AND NECK TECHNIQUE: Multidetector CT imaging of the head and neck was performed using the standard protocol during bolus administration of intravenous contrast. Multiplanar CT image reconstructions and MIPs were obtained to evaluate the vascular anatomy. Carotid stenosis measurements (when applicable) are obtained utilizing NASCET criteria, using the distal internal carotid  diameter as the denominator. CONTRAST:  75mL OMNIPAQUE IOHEXOL 350 MG/ML SOLN COMPARISON:  02/28/2020, correlation is made with same day CT head FINDINGS: CT HEAD FINDINGS For noncontrast findings, please see same day CT head. CTA NECK FINDINGS Aortic arch: Two-vessel arch with a common origin of the brachiocephalic and left common carotid arteries. Imaged portion shows no evidence of aneurysm or dissection. No significant stenosis of the major arch vessel origins. Moderate calcified and noncalcified plaque. Right carotid system: No evidence of dissection, stenosis (50% or greater) or occlusion. Mild narrowing at the bifurcation, secondary to calcified and noncalcified plaque, which is not hemodynamically significant. Left carotid system: 65-70% stenosis of the proximal left ICA, secondary to calcified and noncalcified plaque. No evidence of dissection or occlusion. Vertebral arteries: Severe stenosis at the origin of the right vertebral artery (series 8, images 295-300), which appears worse than on the prior exam. Minimal flow in the right V2 and V3 segments. The extracranial left vertebral artery is without significant stenosis. Skeleton: No acute osseous abnormality. Degenerative changes in the cervical spine. Other neck: Negative. Upper chest: Mild ground-glass opacities, which are nonspecific may represent pulmonary edema. No pleural effusion. Unchanged prominent mediastinal lymph nodes. Review of the MIP images confirms the above findings CTA HEAD FINDINGS Anterior circulation: Redemonstrated moderate to severe calcified plaque in the carotid siphons, right greater than left, overall unchanged. A1 segments patent. Normal anterior communicating artery. Anterior cerebral arteries are patent to their distal aspects. No M1 stenosis or occlusion. Normal MCA bifurcations. Distal MCA branches perfused and symmetric. Posterior circulation: Focal plaque in the left V4, which is otherwise patent. The diminutive right V4  is poorly opacified intracranially, with near complete occlusion just proximal to the take-off of the right PICA and only intermittent faint flow to the vertebrobasilar junction, some of which may be retrograde. The right PICA is not opacified. The left PICA is patent. Basilar patent to its distal aspect. Superior cerebellar arteries patent bilaterally. PCAs perfused to their distal aspects without stenosis. The bilateral posterior communicating arteries are not visualized Venous sinuses: As permitted by contrast timing, patent. Anatomic variants: None significant Review of the MIP images confirms the above findings IMPRESSION: 1. Poor opacification of the right vertebral artery throughout its course, with increased focal stenosis just distal to its take-off. The right V4 is likely occluded proximal to the takeoff of the right PICA, with no definitive flow seen in the right PICA, which is new from prior exam. 2.  Redemonstrated moderate to severe calcified plaque in the carotid siphons, right greater than left, overall unchanged. 3. 65-70% stenosis of the proximal left ICA, secondary to calcified and noncalcified plaque. Electronically Signed   By: Wiliam Ke M.D.   On: 02/16/2021 21:08   MR BRAIN WO CONTRAST  Result Date: 02/17/2021 CLINICAL DATA:  Stroke follow-up EXAM: MRI HEAD WITHOUT CONTRAST TECHNIQUE: Multiplanar, multiecho pulse sequences of the brain and surrounding structures were obtained without intravenous contrast. COMPARISON:  CT and CTA from yesterday FINDINGS: Brain: Wedges of restricted diffusion spanning the right corona radiata anteriorly and extending from the posterior putamen to the caudate nucleus posteriorly. Lacune with facilitated peripheral diffusion in the periventricular white matter on the right. Extensive chronic small vessel ischemia in the hemispheric white matter with confluent periventricular ischemic gliosis and multiple chronic lacunar infarcts. Small remote right  cerebellar infarction. Vascular: Absent right vertebral flow void correlating with prior CTA. Skull and upper cervical spine: Normal marrow signal. Sinuses/Orbits: Negative. IMPRESSION: 1. Acute to subacute perforator infarcts at the right basal ganglia and corona radiata. 2. Extensive chronic small vessel ischemia. Electronically Signed   By: Tiburcio Pea M.D.   On: 02/17/2021 06:38   DG Knee Complete 4 Views Left  Result Date: 02/16/2021 CLINICAL DATA:  Status post fall. EXAM: LEFT KNEE - COMPLETE 4+ VIEW COMPARISON:  None. FINDINGS: No evidence of an acute fracture or dislocation. No evidence of arthropathy or other focal bone abnormality. Moderate to marked severity vascular calcification is seen. A trace joint effusion is seen. IMPRESSION: 1. No acute osseous abnormality. 2. Trace joint effusion. Electronically Signed   By: Aram Candela M.D.   On: 02/16/2021 23:52   DG Knee Complete 4 Views Right  Result Date: 02/16/2021 CLINICAL DATA:  Status post fall. EXAM: RIGHT KNEE - COMPLETE 4+ VIEW COMPARISON:  None. FINDINGS: No evidence of an acute fracture or dislocation. No evidence of arthropathy or other focal bone abnormality. Moderate to marked severity vascular calcification is seen. A very small joint effusion is seen. IMPRESSION: 1. No acute fracture or dislocation. 2. Very small joint effusion. Electronically Signed   By: Aram Candela M.D.   On: 02/16/2021 23:47   CT HEAD CODE STROKE WO CONTRAST  Result Date: 02/16/2021 CLINICAL DATA:  Code stroke.  Left-sided weakness EXAM: CT HEAD WITHOUT CONTRAST TECHNIQUE: Contiguous axial images were obtained from the base of the skull through the vertex without intravenous contrast. COMPARISON:  12/26/2020 FINDINGS: Brain: No acute cortical infarction. Periventricular white matter changes, likely the sequela of chronic small vessel ischemic disease, with redemonstrated small vessel infarcts in the bilateral periventricular white matter,  basal ganglia, left pons, and right cerebellar hemisphere. No acute hemorrhage, mass mass effect, or midline shift. No extra-axial collection or hydrocephalus. Vascular: No hyperdense vessel or unexpected calcification. Skull: Normal. Negative for fracture or focal lesion. Sinuses/Orbits: No acute finding. Other: The mastoid air cells are well aerated. ASPECTS [facility] Stroke Program Early CT Score) - Ganglionic level infarction (caudate, lentiform nuclei, internal capsule, insula, M1-M3 cortex): 7 - Supraganglionic infarction (M4-M6 cortex): 3 Total score (0-10 with 10 being normal): 10 IMPRESSION: 1. No acute intracranial process. 2. ASPECTS is 10 Code stroke imaging results were communicated on 02/16/2021 at 8:27 pm to provider Dr. Derry Lory via secure text paging. Electronically Signed   By: Wiliam Ke M.D.   On: 02/16/2021 20:29    Labs:  CBC: Recent Labs    04/16/20 0954 02/16/21 2013 02/16/21 2100  WBC 7.5  --  12.4*  HGB 16.9 19.4* 16.8  HCT 51.5* 57.0* 49.8  PLT 239  --  341    COAGS: Recent Labs    02/16/21 2100  INR 1.2  APTT 32    BMP: Recent Labs    04/16/20 0954 02/16/21 2013 02/16/21 2100  NA 136 141 137  K 3.9 3.1* 3.1*  CL 91* 98 98  CO2 23  --  27  GLUCOSE 331* 187* 181*  BUN 10 7* 7*  CALCIUM 9.7  --  9.2  CREATININE 0.80 0.70 0.88  GFRNONAA 97  --  >60  GFRAA 112  --   --     LIVER FUNCTION TESTS: Recent Labs    04/16/20 0954 02/16/21 2100  BILITOT 1.4* 1.5*  AST 30 25  ALT 26 17  ALKPHOS 176* 99  PROT 8.6* 7.8  ALBUMIN 4.7 3.8    TUMOR MARKERS: No results for input(s): AFPTM, CEA, CA199, CHROMGRNA in the last 8760 hours.  Assessment and Plan:  60 y/o M with history of DM, HTN, previous CVA who presented to [facility] ED on 11/27 with left sided weakness - code stroke initiated and imaging findings concerning for acute to subacute perforator infarcts at the right basal ganglia and corona radiata as well as right vertebral artery stenosis  with no definitive flow in the right PICA, left ICA stenosis seen today for diagnostic cerebral angiogram for further evaluate these imaging findings.   Discussed procedure in detail with patient today who states that he is undecided about proceeding and wants time to think about it, he is hesitant to proceed with another test that uses IV dye. We discussed the indications of the procedure as well as the risks of foregoing this test however he continues to state that he is not ready to proceed. He states he will let the nurse know when/if he is ready to proceed.  Per chart review patient NPO pending SLP evaluation due to coughing with medicine/liquids -- if patient agrees to procedure today we can potentially move forward this afternoon however it is more likely that this would be done tomorrow (if he is willing). I will place an order for him to be NPO at midnight and we will revisit him in the AM to discuss proceeding.  No consent signed today.  Thank you for this interesting consult.  I greatly enjoyed meeting Brent Young and look forward to participating in their care.  A copy of this report was sent to the requesting provider on this date.  Electronically Signed: Villa Herb, PA-C 02/17/2021, 10:31 AM   I spent a total of 40 Minutes  in face to face in clinical consultation, greater than 50% of which was counseling/coordinating care for diagnostic cerebral angiogram.

## 2021-02-17 NOTE — Progress Notes (Signed)
  Echocardiogram 2D Echocardiogram has been performed.  Brent Young F 02/17/2021, 3:56 PM

## 2021-02-17 NOTE — ED Notes (Signed)
Notified MD pt's BP elevated 195/107. Will allow for permissive htn up to 220 systolic.

## 2021-02-17 NOTE — Progress Notes (Addendum)
STROKE TEAM PROGRESS NOTE   ATTENDING NOTE: I reviewed above note and agree with the assessment and plan. Pt was seen and examined.   60 year old male with history of diabetes, hypertension, obesity, CHF, cirrhosis admitted for left-sided weakness, fell at home not able to get up.  CT negative.  CT head and neck again showed left ICA 65 to 70% stenosis, bilateral siphon high-grade stenosis right more than left.  Right V4 occluded before PICA.  MRI showed right BG/CR infarct.  EF 60 to 65%, LDL 98, A1c pending, creatinine 0.88, WBC 12.4.  02/2020 admitted at [facility] for slurred speech, right facial droop, and fall.  CT possible left pontine infarct.  CTA head and neck left ICA 75% stenosis, bilateral siphon moderate to severe stenosis right more than left, right VA diminutive and occluded after PICA takeoff.  MRI left pontine infarct and left periventricular white matter small infarcts, as well as old lacunar infarcts.  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  A1c 10.4, LDL 72, renal artery ultrasound negative.  Discharged on aspirin Plavix DAPT for 90 days and Lipitor 10.  12/2020 admitted for confusion, CT negative, EF 55 to 60%, THC positive, alcohol 33.  A1c 8.9, DVT negative.  MRI was not done due to claustrophobia.  Patient discharged on aspirin 325 and Lipitor.  On exam, patient AAOx3, no aphasia, follows simple commands.  Found to have left hemiplegia and hemiparesthesia.  Etiology for current stroke likely due to right ICA siphon high-grade stenosis.  Plan for cerebral angiogram in a.m. with Dr. Sherlon Handing.  Currently on aspirin PR 300 given n.p.o. status.  Will consider DAPT once p.o. access.  Increase Lipitor to 40 once p.o. access.  BP goal 130-160 for now given intracranial high-grade stenosis.  Aggressive risk factor modification.  PT/OT pending.  We will follow  For detailed assessment and plan, please refer to above as I have made changes wherever  appropriate.   Marvel Plan, MD PhD Stroke Neurology 02/17/2021 6:22 PM  I discussed with Dr. Sherlon Handing. I spent  35 minutes in total face-to-face time with the patient, more than 50% of which was spent in counseling and coordination of care, reviewing test results, images and medication, and discussing the diagnosis, treatment plan and potential prognosis. This patient's care requiresreview of multiple databases, neurological assessment, discussion with family, other specialists and medical decision making of high complexity.       INTERVAL HISTORY No family is at the bedside. Patient is resting in his hospital bed. He is currently frustrated that he has not been able to rest and is requesting that his cerebral angiogram takes place on 11/29 instead of today.   OBJECTIVE Vitals:   02/17/21 0230 02/17/21 0300 02/17/21 0337 02/17/21 0400  BP: (!) 147/83 140/86 140/86 (!) 164/96  Pulse: (!) 107 (!) 106 (!) 104 (!) 103  Resp: (!) 22 (!) Temp:   98.4 F (36.9 C)   TempSrc:   Oral   SpO2: 91% 93% (!) 18% 95%  Weight:      Height:        CBC:  Recent Labs  Lab 02/16/21 2013 02/16/21 2100  WBC  --  12.4*  NEUTROABS  --  8.4*  HGB 19.4* 16.8  HCT 57.0* 49.8  MCV  --  91.5  PLT  --  341    Basic Metabolic Panel:  Recent Labs  Lab 02/16/21 2013 02/16/21 2100  NA 141 137  K 3.1* 3.1*  CL 98 98  CO2  --  27  GLUCOSE 187* 181*  BUN 7* 7*  CREATININE 0.70 0.88  CALCIUM  --  9.2  MG  --  1.8    Lipid Panel:     Component Value Date/Time   CHOL 148 02/17/2021 0328   CHOL 141 10/08/2020 1140   TRIG 120 02/17/2021 0328   HDL 26 (L) 02/17/2021 0328   HDL 43 10/08/2020 1140   CHOLHDL 5.7 02/17/2021 0328   VLDL 24 02/17/2021 0328   LDLCALC 98 02/17/2021 0328   LDLCALC 75 10/08/2020 1140   HgbA1c:  Lab Results  Component Value Date   HGBA1C 9.1 (A) 10/08/2020   Urine Drug Screen: No results found for: LABOPIA, COCAINSCRNUR, LABBENZ, AMPHETMU, THCU,  LABBARB  Alcohol Level No results found for: ETH  IMAGING   CT ANGIO HEAD W OR WO CONTRAST  Result Date: 02/16/2021 CLINICAL DATA:  Left-sided weakness EXAM: CT ANGIOGRAPHY HEAD AND NECK TECHNIQUE: Multidetector CT imaging of the head and neck was performed using the standard protocol during bolus administration of intravenous contrast. Multiplanar CT image reconstructions and MIPs were obtained to evaluate the vascular anatomy. Carotid stenosis measurements (when applicable) are obtained utilizing NASCET criteria, using the distal internal carotid diameter as the denominator. CONTRAST:  40mL OMNIPAQUE IOHEXOL 350 MG/ML SOLN COMPARISON:  02/28/2020, correlation is made with same day CT head FINDINGS: CT HEAD FINDINGS For noncontrast findings, please see same day CT head. CTA NECK FINDINGS Aortic arch: Two-vessel arch with a common origin of the brachiocephalic and left common carotid arteries. Imaged portion shows no evidence of aneurysm or dissection. No significant stenosis of the major arch vessel origins. Moderate calcified and noncalcified plaque. Right carotid system: No evidence of dissection, stenosis (50% or greater) or occlusion. Mild narrowing at the bifurcation, secondary to calcified and noncalcified plaque, which is not hemodynamically significant. Left carotid system: 65-70% stenosis of the proximal left ICA, secondary to calcified and noncalcified plaque. No evidence of dissection or occlusion. Vertebral arteries: Severe stenosis at the origin of the right vertebral artery (series 8, images 295-300), which appears worse than on the prior exam. Minimal flow in the right V2 and V3 segments. The extracranial left vertebral artery is without significant stenosis. Skeleton: No acute osseous abnormality. Degenerative changes in the cervical spine. Other neck: Negative. Upper chest: Mild ground-glass opacities, which are nonspecific may represent pulmonary edema. No pleural effusion. Unchanged  prominent mediastinal lymph nodes. Review of the MIP images confirms the above findings CTA HEAD FINDINGS Anterior circulation: Redemonstrated moderate to severe calcified plaque in the carotid siphons, right greater than left, overall unchanged. A1 segments patent. Normal anterior communicating artery. Anterior cerebral arteries are patent to their distal aspects. No M1 stenosis or occlusion. Normal MCA bifurcations. Distal MCA branches perfused and symmetric. Posterior circulation: Focal plaque in the left V4, which is otherwise patent. The diminutive right V4 is poorly opacified intracranially, with near complete occlusion just proximal to the take-off of the right PICA and only intermittent faint flow to the vertebrobasilar junction, some of which may be retrograde. The right PICA is not opacified. The left PICA is patent. Basilar patent to its distal aspect. Superior cerebellar arteries patent bilaterally. PCAs perfused to their distal aspects without stenosis. The bilateral posterior communicating arteries are not visualized Venous sinuses: As permitted by contrast timing, patent. Anatomic variants: None significant Review of the MIP images confirms the above findings IMPRESSION: 1. Poor opacification of the right vertebral  artery throughout its course, with increased focal stenosis just distal to its take-off. The right V4 is likely occluded proximal to the takeoff of the right PICA, with no definitive flow seen in the right PICA, which is new from prior exam. 2. Redemonstrated moderate to severe calcified plaque in the carotid siphons, right greater than left, overall unchanged. 3. 65-70% stenosis of the proximal left ICA, secondary to calcified and noncalcified plaque. Electronically Signed   By: Wiliam Ke M.D.   On: 02/16/2021 21:08   DG Chest 1 View  Result Date: 02/16/2021 CLINICAL DATA:  Status post fall with left-sided weakness. EXAM: CHEST  1 VIEW COMPARISON:  December 26, 2020 FINDINGS: Mild  to moderate severity, predominant stable areas of scarring and/or atelectasis are noted within the bilateral lung bases there is no evidence of a pleural effusion or pneumothorax. The cardiac silhouette is mildly enlarged and unchanged in size. The visualized skeletal structures are unremarkable. IMPRESSION: Stable cardiomegaly with mild to moderate severity, predominant stable areas of bibasilar scarring and/or atelectasis. Electronically Signed   By: Aram Candela M.D.   On: 02/16/2021 23:51   DG Shoulder Right  Result Date: 02/16/2021 CLINICAL DATA:  Fall. EXAM: RIGHT SHOULDER - 2+ VIEW COMPARISON:  Right shoulder x-ray 05/02/2020 FINDINGS: Healed greater tuberosity fracture is present. No definite acute fracture or dislocation. There are stable mild degenerative changes of the acromioclavicular joint. There are vascular calcifications in the soft tissues. IMPRESSION: 1. No acute bony abnormality. Electronically Signed   By: Darliss Cheney M.D.   On: 02/16/2021 23:38   CT ANGIO NECK W OR WO CONTRAST  Result Date: 02/16/2021 CLINICAL DATA:  Left-sided weakness EXAM: CT ANGIOGRAPHY HEAD AND NECK TECHNIQUE: Multidetector CT imaging of the head and neck was performed using the standard protocol during bolus administration of intravenous contrast. Multiplanar CT image reconstructions and MIPs were obtained to evaluate the vascular anatomy. Carotid stenosis measurements (when applicable) are obtained utilizing NASCET criteria, using the distal internal carotid diameter as the denominator. CONTRAST:  37mL OMNIPAQUE IOHEXOL 350 MG/ML SOLN COMPARISON:  02/28/2020, correlation is made with same day CT head FINDINGS: CT HEAD FINDINGS For noncontrast findings, please see same day CT head. CTA NECK FINDINGS Aortic arch: Two-vessel arch with a common origin of the brachiocephalic and left common carotid arteries. Imaged portion shows no evidence of aneurysm or dissection. No significant stenosis of the major arch  vessel origins. Moderate calcified and noncalcified plaque. Right carotid system: No evidence of dissection, stenosis (50% or greater) or occlusion. Mild narrowing at the bifurcation, secondary to calcified and noncalcified plaque, which is not hemodynamically significant. Left carotid system: 65-70% stenosis of the proximal left ICA, secondary to calcified and noncalcified plaque. No evidence of dissection or occlusion. Vertebral arteries: Severe stenosis at the origin of the right vertebral artery (series 8, images 295-300), which appears worse than on the prior exam. Minimal flow in the right V2 and V3 segments. The extracranial left vertebral artery is without significant stenosis. Skeleton: No acute osseous abnormality. Degenerative changes in the cervical spine. Other neck: Negative. Upper chest: Mild ground-glass opacities, which are nonspecific may represent pulmonary edema. No pleural effusion. Unchanged prominent mediastinal lymph nodes. Review of the MIP images confirms the above findings CTA HEAD FINDINGS Anterior circulation: Redemonstrated moderate to severe calcified plaque in the carotid siphons, right greater than left, overall unchanged. A1 segments patent. Normal anterior communicating artery. Anterior cerebral arteries are patent to their distal aspects. No M1 stenosis or occlusion. Normal MCA  bifurcations. Distal MCA branches perfused and symmetric. Posterior circulation: Focal plaque in the left V4, which is otherwise patent. The diminutive right V4 is poorly opacified intracranially, with near complete occlusion just proximal to the take-off of the right PICA and only intermittent faint flow to the vertebrobasilar junction, some of which may be retrograde. The right PICA is not opacified. The left PICA is patent. Basilar patent to its distal aspect. Superior cerebellar arteries patent bilaterally. PCAs perfused to their distal aspects without stenosis. The bilateral posterior communicating  arteries are not visualized Venous sinuses: As permitted by contrast timing, patent. Anatomic variants: None significant Review of the MIP images confirms the above findings IMPRESSION: 1. Poor opacification of the right vertebral artery throughout its course, with increased focal stenosis just distal to its take-off. The right V4 is likely occluded proximal to the takeoff of the right PICA, with no definitive flow seen in the right PICA, which is new from prior exam. 2. Redemonstrated moderate to severe calcified plaque in the carotid siphons, right greater than left, overall unchanged. 3. 65-70% stenosis of the proximal left ICA, secondary to calcified and noncalcified plaque. Electronically Signed   By: Wiliam Ke M.D.   On: 02/16/2021 21:08   MR BRAIN WO CONTRAST  Result Date: 02/17/2021 CLINICAL DATA:  Stroke follow-up EXAM: MRI HEAD WITHOUT CONTRAST TECHNIQUE: Multiplanar, multiecho pulse sequences of the brain and surrounding structures were obtained without intravenous contrast. COMPARISON:  CT and CTA from yesterday FINDINGS: Brain: Wedges of restricted diffusion spanning the right corona radiata anteriorly and extending from the posterior putamen to the caudate nucleus posteriorly. Lacune with facilitated peripheral diffusion in the periventricular white matter on the right. Extensive chronic small vessel ischemia in the hemispheric white matter with confluent periventricular ischemic gliosis and multiple chronic lacunar infarcts. Small remote right cerebellar infarction. Vascular: Absent right vertebral flow void correlating with prior CTA. Skull and upper cervical spine: Normal marrow signal. Sinuses/Orbits: Negative. IMPRESSION: 1. Acute to subacute perforator infarcts at the right basal ganglia and corona radiata. 2. Extensive chronic small vessel ischemia. Electronically Signed   By: Tiburcio Pea M.D.   On: 02/17/2021 06:38   DG Knee Complete 4 Views Left  Result Date:  02/16/2021 CLINICAL DATA:  Status post fall. EXAM: LEFT KNEE - COMPLETE 4+ VIEW COMPARISON:  None. FINDINGS: No evidence of an acute fracture or dislocation. No evidence of arthropathy or other focal bone abnormality. Moderate to marked severity vascular calcification is seen. A trace joint effusion is seen. IMPRESSION: 1. No acute osseous abnormality. 2. Trace joint effusion. Electronically Signed   By: Aram Candela M.D.   On: 02/16/2021 23:52   DG Knee Complete 4 Views Right  Result Date: 02/16/2021 CLINICAL DATA:  Status post fall. EXAM: RIGHT KNEE - COMPLETE 4+ VIEW COMPARISON:  None. FINDINGS: No evidence of an acute fracture or dislocation. No evidence of arthropathy or other focal bone abnormality. Moderate to marked severity vascular calcification is seen. A very small joint effusion is seen. IMPRESSION: 1. No acute fracture or dislocation. 2. Very small joint effusion. Electronically Signed   By: Aram Candela M.D.   On: 02/16/2021 23:47   CT HEAD CODE STROKE WO CONTRAST  Result Date: 02/16/2021 CLINICAL DATA:  Code stroke.  Left-sided weakness EXAM: CT HEAD WITHOUT CONTRAST TECHNIQUE: Contiguous axial images were obtained from the base of the skull through the vertex without intravenous contrast. COMPARISON:  12/26/2020 FINDINGS: Brain: No acute cortical infarction. Periventricular white matter changes, likely the sequela  of chronic small vessel ischemic disease, with redemonstrated small vessel infarcts in the bilateral periventricular white matter, basal ganglia, left pons, and right cerebellar hemisphere. No acute hemorrhage, mass mass effect, or midline shift. No extra-axial collection or hydrocephalus. Vascular: No hyperdense vessel or unexpected calcification. Skull: Normal. Negative for fracture or focal lesion. Sinuses/Orbits: No acute finding. Other: The mastoid air cells are well aerated. ASPECTS [facility] Stroke Program Early CT Score) - Ganglionic level infarction (caudate,  lentiform nuclei, internal capsule, insula, M1-M3 cortex): 7 - Supraganglionic infarction (M4-M6 cortex): 3 Total score (0-10 with 10 being normal): 10 IMPRESSION: 1. No acute intracranial process. 2. ASPECTS is 10 Code stroke imaging results were communicated on 02/16/2021 at 8:27 pm to provider Dr. Derry Lory via secure text paging. Electronically Signed   By: Wiliam Ke M.D.   On: 02/16/2021 20:29     Transthoracic Echocardiogram  00/00/2021 Pending  ECG - ST rate 110 BPM. (See cardiology reading for complete details)  PHYSICAL EXAM Blood pressure (!) 164/96, pulse (!) 103, temperature 98.4 F (36.9 C), temperature source Oral, resp. rate 17, height  (1.854 m), weight 124.7 kg, SpO2 95 %.  Temp:  [98.4 F (36.9 C)-98.7 F (37.1 C)] 98.4 F (36.9 C) (11/28 0337) Pulse Rate:  [90-115] 103 (11/28 1147) Resp:  [17-29] 27 (11/28 1147) BP: (140-195)/(83-120) 195/107 (11/28 1147) SpO2:  [18 %-96 %] 94 % (11/28 1147) Weight:  [124.7 kg] 124.7 kg (11/27 2106)  General - Well nourished, well developed, in no apparent distress.  Ophthalmologic - fundi not visualized due to noncooperation.  Cardiovascular - Regular rhythm and rate, Hypertensive.  Mental Status -  Level of arousal and orientation to time, place, and person were intact. Language including expression, naming, repetition, comprehension was assessed and found intact. Attention span and concentration were normal. Recent and remote memory were intact. Fund of Knowledge was assessed and was intact.  Cranial Nerves II - XII - II - Visual field intact OU. III, IV, VI - Extraocular movements intact. V - Facial sensation intact bilaterally. VII - Facial movement intact bilaterally. VIII - Hearing & vestibular intact bilaterally. X - Palate elevates symmetrically. XI - Chin turning & shoulder shrug intact bilaterally. XII - Tongue protrusion intact.  Motor Strength - Bulk was normal and fasciculations were absent No  movement in left lower extremity.  5/5 strength in right extremities.  LUE at shoulder 0/5, Left hand grip strength 3/5 LLE 0/5, at knee 0/5  Motor Tone - Muscle tone was assessed at the neck and appendages and was normal.  Reflexes - The patient's reflexes were symmetrical in all extremities and he had no pathological reflexes.  Sensory - Light touch, temperature/pinprick were assessed and were symmetrical Decreased sensation to light touch in LUE  Coordination - FTN unable to complete due to weakness in LUE HTS deferred due to plegia in LLE Rapid alternating movement are slowed in LUE  Gait and Station - deferred.   ASSESSMENT/PLAN Mr. MICHAEAL DAVIS is a 60 y.o. 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 He rolled out of the bed and fell on the floor. He was unable to get up and stayed on the floor all day.    Stroke: acute to subacute perforator infarcts at the right basal ganglia and corona radiata secondary to right ICA siphon severe stenosis  CT Head - No acute intracranial process. ASPECTS is 10. MRI head - Acute to subacute perforator infarcts at the right basal ganglia and corona radiata. Extensive chronic small vessel ischemia. 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 Re-demonstrated moderate to severe calcified plaque in the carotid siphons, right greater than left, overall unchanged. 65-70% stenosis of the proximal left ICA 2D Echo - pending LDL - 98 HgbA1c - pending UDS - positive for THC VTE prophylaxis - SCDs No antithrombotic prior to admission, now on ASA 300 PR given NPO status.  Consider DAPT once p.o. access. Patient will be counseled to be compliant with his antithrombotic medications Ongoing  aggressive stroke risk factor management Therapy recommendations:  pending Disposition:  Pending  Intracranial vascular stenosis 02/2020 CTA head and neck left ICA 75% stenosis, bilateral siphon moderate to severe stenosis right more than left This admission re-demonstrated moderate to severe calcified plaque in the carotid siphons, right greater than left, overall unchanged. 65-70% stenosis of the proximal left ICA Plan for cerebral angiogram with Dr. Sherlon Handing tomorrow Consider DAPT once p.o. access  History of stroke   02/2020 admitted at [facility] for slurred speech, right facial droop, and fall.  CT possible left pontine infarct.  CTA head and neck left ICA 75% stenosis, bilateral siphon moderate to severe stenosis right more than left, right VA diminutive and occluded after PICA takeoff.  MRI left pontine infarct and left periventricular white matter small infarcts, as well as old lacunar infarcts.  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  A1c 10.4, LDL 72, renal artery ultrasound negative.  Discharged on aspirin Plavix DAPT for 90 days and Lipitor 10. 12/2020 admitted for confusion, CT negative, EF 55 to 60%, THC positive, alcohol 33.  A1c 8.9, DVT negative.  MRI was not done due to claustrophobia.  Patient discharged on aspirin 325 and Lipitor.  Hypertension Home BP meds: Cozaar, HCTZ, Toprol, Norvasc Stable BP goal 130-160 given severe intravascular stenosis before intervention. Long-term BP goal normotensive after intervention  Hyperlipidemia Home Lipid lowering medication: Lipitor 10 mg daily LDL 98, goal < 70 Current lipid lowering medication: Lipitor 40 mg daily Continue statin at discharge  Diabetes Home diabetic meds: Tradjenta , metformin, Amaryl Current diabetic meds: Tradjenta HgbA1c - pending, goal < 7.0 SSI CBG monitoring Close PCP follow-up for better DM control  Tobacco abuse Current smoker Smoking cessation counseling  provided Pt is willing to quit  Other Stroke Risk Factors ETOH use, advised to drink no more than 1 alcoholic beverage per day. Obesity, Body mass index is 36.28 kg/m., recommend weight loss, diet and exercise as appropriate  Substance use - marijuana   Other Active Problems Code status - Full code Tachycardia - low 100's Leukocytosis - WBC's - 12.4 (afebrile) Hypokalemia - 3.1 - supplemented History of cirrhosis - currently LFTs WNL and INR normal   Hospital day # 0  Elmer Picker, FNP-BC [facility]  Patient seen by APP and MD. Patient assessed with MD  To contact Stroke Continuity provider, please refer to [email or web address]. After hours, contact General Neurology

## 2021-02-17 NOTE — Progress Notes (Signed)
PT Cancellation Note  Patient Details Name: Brent Young MRN: [id number] DOB: [birth year]   Cancelled Treatment:    Reason Eval/Treat Not Completed: Active bedrest order. Please update activity order when appropriate for PT to proceed with eval.   Ilda Foil 02/17/2021, 11:32 AM  Aida Raider, PT  Office # [phone] Pager [phone]

## 2021-02-17 NOTE — ED Notes (Signed)
Called PT and LM to see if they can assess pt.

## 2021-02-17 NOTE — Progress Notes (Signed)
PROGRESS NOTE  Brief Narrative: Brent Young is a 60 y.o. male with a history of lacunar CVAs, arthritis, T2DM, HTN, and obesity who presented to the ED 11/27 as a code stroke with left arm and leg weakness upon waking, LKW 10pm previous night, having fallen out of bed and been unable to get up all day. CT and CTA head/neck showed right vertebral artery stenosis with new right PICA occlusion as well as redemonstrated unchanged mod-severe calcified plaque in R > L carotid siphons. MRI demonstrated acute-subacute infarcts at right basal ganglia and corona radiata on background of extensive chronic small vessel ischemia. Neurology and neurointerventional radiology were consulted with plans for cerebral angiogram 11/28. The patient was admitted earlier this morning by Dr. Rachael Darby.   Subjective: Still weak in left leg, can barely move it. Left arm and hand are less but still significantly weak. No numbness. Failed swallow screen. He is frustrated by taking too long to get into a hospital bed last night, feels the staff have at times not been forthcoming, generally has had displeasure with the care provided at [facility], requested EMS transport him to [facility] though this was not done. His wife died here.   Objective: BP (!) 201/101   Pulse (!) 102   Temp 98.4 F (36.9 C) (Oral)   Resp (!) 27   Ht 6\' 1"  (1.854 m)   Wt 124.7 kg   SpO2 93%   BMI 36.28 kg/m   Gen: Nontoxic Pulm: Clear and nonlabored on room air  CV: RRR, no murmur, no JVD, no edema GI: Soft, NT, ND, +BS  Neuro: Alert and oriented. LLE > LUE hemiparesis. Left eyelid ptosis which is correctable with intention. No other cranial nerve deficits grossly. Skin: Dorsal left 2nd toe with abrasion, hemostatic.   Assessment & Plan: Principal Problem:   Stroke [facility]) Active Problems:   Essential hypertension   Diabetes mellitus type 2 in obese (HCC)   Elevated troponin   Prolonged QT interval  Recurrent acute CVA, right vertebral artery  stenosis on background extensive small vessel ischemic cerebrovascular disease, history of L basal ganglia and hippocampal ICH: Right basal ganglia, corona radiata, with left hemiparesis, dysphagia.  - Started aspirin 325 mg daily. - Stroke neurology following. Neurointerventional radiology consulted for cerebral angiogram. - Augmenting statin atorvastatin 10mg  > 40mg . LDL 98.  - Echo pending, continue cardiac monitoring.  - Remains with significant deficits. SLP, OT, OT ordered  Right shoulder pain: Consistent with mild AC joint arthritis possibly from falling and overuse. XR without fracture or dislocation.  - Tylenol and toradol prn.  - Consider further work up if not improving as expected.   HTN:  - Allow permissive HTN for now (220/120). Restart BP medications timing per neurology, including losartan, HCTZ, metoprolol, amlodipine.   Tobacco use:  - Cessation counseling provided  Demand myocardial ischemia: No chest pain. Troponin very mildly elevated. No ischemic ECG changes (computer interpretation of ST elevations in inferior leads is incorrect) - Outpatient risk stratification planned unless anginal symptoms develop.   Hypokalemia:  - Supplement and monitor  Leukocytosis: Suspect reactive to CVA.  - Monitor off abx for now  T2DM:  - Update HbA1c.  - SSI. Continue home linagliptin  Obesity: Body mass index is 36.28 kg/m.   [facility], MD Pager on amion 02/17/2021, 2:05 PM

## 2021-02-17 NOTE — ED Notes (Signed)
Transported to MRI

## 2021-02-17 NOTE — H&P (Signed)
History and Physical    Brent Young [id number] DOB: 1960/11/03 DOA: 02/16/2021  PCP: Kerin Perna, NP   Patient coming from: Home  Chief Complaint: Cannot move or feel left leg  HPI: Brent Young is a 60 y.o. male with medical history significant for diabetes mellitus type 2, hypertension, hyperlipidemia, history of lacunar infarcts, history of hippocampal ICH in 2021.  He presents to the emergency room with left leg numbness and weakness.  He reports that when he woke up on the morning of February 16, 2021 he could not feel his left leg he tried to stand up and fell to the ground.  His son helped him back into the bed and then went to work.  He thought he was doing fine so he tried to stand up again shortly after he left but then fell back down and was laying on the floor for the rest of the day until his family got home.  When they found him on the floor they called EMS.  He denies any loss of consciousness or head trauma.  States he did not have his phone with him and could not call anyone for help.  He tried to get back up and rubbed his left foot on the floor multiple times causing some superficial abrasions on his toes.  Denies having any fever, chills, chest pain, palpitations, shortness of breath, abdominal pain, nausea vomiting or diarrhea.  ED Course: In the emergency room patient initially had some tachycardia that improved with IV fluid hydration.  He has been hemodynamically stable in the emergency room.  He was found to have an acute stroke on CT scan.  He has stenosis on CTA of the head.  He has been evaluated by neurology.  He refuses to have the MRI tonight but states he will do it in the morning.  And have a mildly elevated troponin level of 37.  CK was evaluated at 574.  WBC 12,400 hemoglobin 16.8 hematocrit 49.8 platelets 341,000.  Sodium 137 potassium 3.1 chloride 98 bicarb 27 creatinine 0.88 BUN 7 magnesium 1.8 bilirubin 1.5 alkaline phosphatase 99 AST 25 ALT  17.  Hospitalist service asked to admit for further management  Review of Systems:  General: Denies fever, chills, weight loss, night sweats.  Denies dizziness.  Denies change in appetite HENT: Denies head trauma, headache, denies change in hearing, tinnitus.  Denies nasal congestion or bleeding.  Denies sore throat,  Denies difficulty swallowing Eyes: Denies blurry vision, pain in eye, drainage.  Denies discoloration of eyes. Neck: Denies pain.  Denies swelling.  Denies pain with movement. Cardiovascular: Denies chest pain, palpitations.  Denies edema.  Denies orthopnea Respiratory: Denies shortness of breath, cough.  Denies wheezing.  Denies sputum production Gastrointestinal: Denies abdominal pain, swelling.  Denies nausea, vomiting, diarrhea.  Denies melena.  Denies hematemesis. Musculoskeletal: Denies limitation of movement.  Denies deformity or swelling. Denies arthralgias or myalgias. Genitourinary: Denies pelvic pain.  Denies urinary frequency or hesitancy.  Denies dysuria.  Skin: Denies rash.  Denies petechiae, purpura, ecchymosis. Neurological: Reports left leg weakness, numbness and unable to move left leg.  Denies syncope.  Denies seizure activity.  Denies slurred speech, drooping face.  Denies visual change. Psychiatric: Denies depression, anxiety.  Denies hallucinations.  Past Medical History:  Diagnosis Date   Allergy    Arthritis    Diabetes mellitus without complication ([place])    Hypertension    Stroke [facility])     Past Surgical History:  Procedure Laterality Date  FRACTURE SURGERY Left    Patient fractured left arm    Social History  reports that he has been smoking cigarettes. He has never used smokeless tobacco. He reports current alcohol use. He reports current drug use. Drug: Marijuana.  No Known Allergies  Family History  Problem Relation Age of Onset   Colon cancer Maternal Grandmother    Esophageal cancer Neg Hx    Stomach cancer Neg Hx    Rectal cancer  Neg Hx      Prior to Admission medications   Medication Sig Start Date End Date Taking? Authorizing Provider  amLODipine (NORVASC) 10 MG tablet Take 1 tablet (10 mg total) by mouth daily. 05/28/20  Yes Kerin Perna, NP  atorvastatin (LIPITOR) 10 MG tablet Take 1 tablet (10 mg total) by mouth daily. 05/28/20  Yes Kerin Perna, NP  ergocalciferol (VITAMIN D2) 1.25 MG (50000 UT) capsule Take 50,000 Units by mouth every Wednesday. 01/03/21  Yes [provider]  gabapentin (NEURONTIN) 300 MG capsule TAKE 1 CAPSULE BY MOUTH THREE TIMES A DAY Patient taking differently: Take 300 mg by mouth 3 (three) times daily. 12/16/20  Yes Kerin Perna, NP  glimepiride (AMARYL) 4 MG tablet Take 1 tablet (4 mg total) by mouth every morning. 10/08/20  Yes Kerin Perna, NP  hydrochlorothiazide (HYDRODIURIL) 25 MG tablet Take 1 tablet (25 mg total) by mouth daily. 06/28/20  Yes Kerin Perna, NP  losartan (COZAAR) 25 MG tablet Take 1 tablet (25 mg total) by mouth daily. 05/28/20  Yes Kerin Perna, NP  metFORMIN (GLUCOPHAGE) 1000 MG tablet Take 1 tablet (1,000 mg total) by mouth 2 (two) times daily with a meal. 05/28/20  Yes Kerin Perna, NP  metoprolol succinate (TOPROL-XL) 25 MG 24 hr tablet Take 25 mg by mouth 3 (three) times daily. 12/30/20  Yes [provider]  Oxcarbazepine (TRILEPTAL) 300 MG tablet Take 1 tablet (300 mg total) by mouth 3 (three) times daily. 02/04/20  Yes Kerin Perna, NP  sitaGLIPtin (JANUVIA) 50 MG tablet Take 1 tablet (50 mg total) by mouth daily. 10/08/20  Yes Kerin Perna, NP    Physical Exam: Vitals:   02/16/21 2100 02/16/21 2106 02/16/21 2230 02/17/21 0000  BP: (!) 164/120  (!) 161/97 (!) 184/98  Pulse: (!) 112  (!) 110 90  Resp: (!) 22  (!) 23 18  Temp:      TempSrc:      SpO2: 93%  92% 90%  Weight:  124.7 kg    Height:  6\' 1"  (1.854 m)      Constitutional: NAD, calm, comfortable Vitals:   02/16/21 2100  02/16/21 2106 02/16/21 2230 02/17/21 0000  BP: (!) 164/120  (!) 161/97 (!) 184/98  Pulse: (!) 112  (!) 110 90  Resp: (!) 22  (!) 23 18  Temp:      TempSrc:      SpO2: 93%  92% 90%  Weight:  124.7 kg    Height:  6\' 1"  (1.854 m)     General: WDWN, Alert and oriented x3.  Eyes: EOMI, PERRL, conjunctivae normal.  Sclera nonicteric.  No nystagmus HENT:  [place]/AT, external ears normal.  Nares patent without epistasis.  Mucous membranes are moist. Posterior pharynx clear of any exudate or lesions.  Neck: Soft, normal range of motion, supple, no masses, Trachea midline Respiratory: clear to auscultation bilaterally, no wheezing, no crackles. Normal respiratory effort. No accessory muscle use.  Cardiovascular: Regular rate and rhythm, no  murmurs / rubs / gallops. No extremity edema. 2+ pedal pulses. No carotid bruits.  Abdomen: Soft, no tenderness, nondistended, no rebound or guarding.  No masses palpated. Bowel sounds normoactive Musculoskeletal: No cyanosis. No joint deformity upper and lower extremities. Normal muscle tone.  Patient not able to move left leg.  Has several small superficial abrasions on the PIP joint of the third and fourth left toes. Skin: Warm, dry, intact no rashes. No induration Neurologic: CN 2-12 grossly intact.  Normal speech. Strength 5/5 in upper extremities and right lower extremity.  No tremors.  Tongue with normal extension without deviation.  No pronator drift of upper extremities.  Patient unable to move left Psychiatric: Normal judgment and insight.  Normal mood.    Labs on Admission: I have personally reviewed following labs and imaging studies  CBC: Recent Labs  Lab 02/16/21 2013 02/16/21 2100  WBC  --  12.4*  NEUTROABS  --  8.4*  HGB 19.4* 16.8  HCT 57.0* 49.8  MCV  --  91.5  PLT  --  [id number]    Basic Metabolic Panel: Recent Labs  Lab 02/16/21 2013 02/16/21 2100  NA 141 137  K 3.1* 3.1*  CL 98 98  CO2  --  27  GLUCOSE 187* 181*  BUN 7* 7*   CREATININE 0.70 0.88  CALCIUM  --  9.2  MG  --  1.8    GFR: Estimated Creatinine Clearance: 121.9 mL/min (by C-G formula based on SCr of 0.88 mg/dL).  Liver Function Tests: Recent Labs  Lab 02/16/21 2100  AST 25  ALT 17  ALKPHOS 99  BILITOT 1.5*  PROT 7.8  ALBUMIN 3.8    Urine analysis:    Component Value Date/Time   BILIRUBINUR negative 01/18/2020 1529   KETONESUR negative 01/18/2020 1529   UROBILINOGEN 0.2 01/18/2020 1529   NITRITE Negative 01/18/2020 1529   LEUKOCYTESUR Negative 01/18/2020 1529    Radiological Exams on Admission: CT ANGIO HEAD W OR WO CONTRAST  Result Date: 02/16/2021 CLINICAL DATA:  Left-sided weakness EXAM: CT ANGIOGRAPHY HEAD AND NECK TECHNIQUE: Multidetector CT imaging of the head and neck was performed using the standard protocol during bolus administration of intravenous contrast. Multiplanar CT image reconstructions and MIPs were obtained to evaluate the vascular anatomy. Carotid stenosis measurements (when applicable) are obtained utilizing NASCET criteria, using the distal internal carotid diameter as the denominator. CONTRAST:  71mL OMNIPAQUE IOHEXOL 350 MG/ML SOLN COMPARISON:  02/28/2020, correlation is made with same day CT head FINDINGS: CT HEAD FINDINGS For noncontrast findings, please see same day CT head. CTA NECK FINDINGS Aortic arch: Two-vessel arch with a common origin of the brachiocephalic and left common carotid arteries. Imaged portion shows no evidence of aneurysm or dissection. No significant stenosis of the major arch vessel origins. Moderate calcified and noncalcified plaque. Right carotid system: No evidence of dissection, stenosis (50% or greater) or occlusion. Mild narrowing at the bifurcation, secondary to calcified and noncalcified plaque, which is not hemodynamically significant. Left carotid system: 65-70% stenosis of the proximal left ICA, secondary to calcified and noncalcified plaque. No evidence of dissection or occlusion.  Vertebral arteries: Severe stenosis at the origin of the right vertebral artery (series 8, images 295-300), which appears worse than on the prior exam. Minimal flow in the right V2 and V3 segments. The extracranial left vertebral artery is without significant stenosis. Skeleton: No acute osseous abnormality. Degenerative changes in the cervical spine. Other neck: Negative. Upper chest: Mild ground-glass opacities, which are nonspecific may represent  pulmonary edema. No pleural effusion. Unchanged prominent mediastinal lymph nodes. Review of the MIP images confirms the above findings CTA HEAD FINDINGS Anterior circulation: Redemonstrated moderate to severe calcified plaque in the carotid siphons, right greater than left, overall unchanged. A1 segments patent. Normal anterior communicating artery. Anterior cerebral arteries are patent to their distal aspects. No M1 stenosis or occlusion. Normal MCA bifurcations. Distal MCA branches perfused and symmetric. Posterior circulation: Focal plaque in the left V4, which is otherwise patent. The diminutive right V4 is poorly opacified intracranially, with near complete occlusion just proximal to the take-off of the right PICA and only intermittent faint flow to the vertebrobasilar junction, some of which may be retrograde. The right PICA is not opacified. The left PICA is patent. Basilar patent to its distal aspect. Superior cerebellar arteries patent bilaterally. PCAs perfused to their distal aspects without stenosis. The bilateral posterior communicating arteries are not visualized Venous sinuses: As permitted by contrast timing, patent. Anatomic variants: None significant Review of the MIP images confirms the above findings IMPRESSION: 1. Poor opacification of the right vertebral artery throughout its course, with increased focal stenosis just distal to its take-off. The right V4 is likely occluded proximal to the takeoff of the right PICA, with no definitive flow seen in  the right PICA, which is new from prior exam. 2. Redemonstrated moderate to severe calcified plaque in the carotid siphons, right greater than left, overall unchanged. 3. 65-70% stenosis of the proximal left ICA, secondary to calcified and noncalcified plaque. Electronically Signed   By: Wiliam Ke M.D.   On: 02/16/2021 21:08   DG Chest 1 View  Result Date: 02/16/2021 CLINICAL DATA:  Status post fall with left-sided weakness. EXAM: CHEST  1 VIEW COMPARISON:  December 26, 2020 FINDINGS: Mild to moderate severity, predominant stable areas of scarring and/or atelectasis are noted within the bilateral lung bases there is no evidence of a pleural effusion or pneumothorax. The cardiac silhouette is mildly enlarged and unchanged in size. The visualized skeletal structures are unremarkable. IMPRESSION: Stable cardiomegaly with mild to moderate severity, predominant stable areas of bibasilar scarring and/or atelectasis. Electronically Signed   By: Aram Candela M.D.   On: 02/16/2021 23:51   DG Shoulder Right  Result Date: 02/16/2021 CLINICAL DATA:  Fall. EXAM: RIGHT SHOULDER - 2+ VIEW COMPARISON:  Right shoulder x-ray 05/02/2020 FINDINGS: Healed greater tuberosity fracture is present. No definite acute fracture or dislocation. There are stable mild degenerative changes of the acromioclavicular joint. There are vascular calcifications in the soft tissues. IMPRESSION: 1. No acute bony abnormality. Electronically Signed   By: Darliss Cheney M.D.   On: 02/16/2021 23:38   CT ANGIO NECK W OR WO CONTRAST  Result Date: 02/16/2021 CLINICAL DATA:  Left-sided weakness EXAM: CT ANGIOGRAPHY HEAD AND NECK TECHNIQUE: Multidetector CT imaging of the head and neck was performed using the standard protocol during bolus administration of intravenous contrast. Multiplanar CT image reconstructions and MIPs were obtained to evaluate the vascular anatomy. Carotid stenosis measurements (when applicable) are obtained utilizing  NASCET criteria, using the distal internal carotid diameter as the denominator. CONTRAST:  47mL OMNIPAQUE IOHEXOL 350 MG/ML SOLN COMPARISON:  02/28/2020, correlation is made with same day CT head FINDINGS: CT HEAD FINDINGS For noncontrast findings, please see same day CT head. CTA NECK FINDINGS Aortic arch: Two-vessel arch with a common origin of the brachiocephalic and left common carotid arteries. Imaged portion shows no evidence of aneurysm or dissection. No significant stenosis of the major arch vessel origins.  Moderate calcified and noncalcified plaque. Right carotid system: No evidence of dissection, stenosis (50% or greater) or occlusion. Mild narrowing at the bifurcation, secondary to calcified and noncalcified plaque, which is not hemodynamically significant. Left carotid system: 65-70% stenosis of the proximal left ICA, secondary to calcified and noncalcified plaque. No evidence of dissection or occlusion. Vertebral arteries: Severe stenosis at the origin of the right vertebral artery (series 8, images 295-300), which appears worse than on the prior exam. Minimal flow in the right V2 and V3 segments. The extracranial left vertebral artery is without significant stenosis. Skeleton: No acute osseous abnormality. Degenerative changes in the cervical spine. Other neck: Negative. Upper chest: Mild ground-glass opacities, which are nonspecific may represent pulmonary edema. No pleural effusion. Unchanged prominent mediastinal lymph nodes. Review of the MIP images confirms the above findings CTA HEAD FINDINGS Anterior circulation: Redemonstrated moderate to severe calcified plaque in the carotid siphons, right greater than left, overall unchanged. A1 segments patent. Normal anterior communicating artery. Anterior cerebral arteries are patent to their distal aspects. No M1 stenosis or occlusion. Normal MCA bifurcations. Distal MCA branches perfused and symmetric. Posterior circulation: Focal plaque in the left V4,  which is otherwise patent. The diminutive right V4 is poorly opacified intracranially, with near complete occlusion just proximal to the take-off of the right PICA and only intermittent faint flow to the vertebrobasilar junction, some of which may be retrograde. The right PICA is not opacified. The left PICA is patent. Basilar patent to its distal aspect. Superior cerebellar arteries patent bilaterally. PCAs perfused to their distal aspects without stenosis. The bilateral posterior communicating arteries are not visualized Venous sinuses: As permitted by contrast timing, patent. Anatomic variants: None significant Review of the MIP images confirms the above findings IMPRESSION: 1. Poor opacification of the right vertebral artery throughout its course, with increased focal stenosis just distal to its take-off. The right V4 is likely occluded proximal to the takeoff of the right PICA, with no definitive flow seen in the right PICA, which is new from prior exam. 2. Redemonstrated moderate to severe calcified plaque in the carotid siphons, right greater than left, overall unchanged. 3. 65-70% stenosis of the proximal left ICA, secondary to calcified and noncalcified plaque. Electronically Signed   By: Merilyn Baba M.D.   On: 02/16/2021 21:08   DG Knee Complete 4 Views Left  Result Date: 02/16/2021 CLINICAL DATA:  Status post fall. EXAM: LEFT KNEE - COMPLETE 4+ VIEW COMPARISON:  None. FINDINGS: No evidence of an acute fracture or dislocation. No evidence of arthropathy or other focal bone abnormality. Moderate to marked severity vascular calcification is seen. A trace joint effusion is seen. IMPRESSION: 1. No acute osseous abnormality. 2. Trace joint effusion. Electronically Signed   By: Virgina Norfolk M.D.   On: 02/16/2021 23:52   DG Knee Complete 4 Views Right  Result Date: 02/16/2021 CLINICAL DATA:  Status post fall. EXAM: RIGHT KNEE - COMPLETE 4+ VIEW COMPARISON:  None. FINDINGS: No evidence of an acute  fracture or dislocation. No evidence of arthropathy or other focal bone abnormality. Moderate to marked severity vascular calcification is seen. A very small joint effusion is seen. IMPRESSION: 1. No acute fracture or dislocation. 2. Very small joint effusion. Electronically Signed   By: Virgina Norfolk M.D.   On: 02/16/2021 23:47   CT HEAD CODE STROKE WO CONTRAST  Result Date: 02/16/2021 CLINICAL DATA:  Code stroke.  Left-sided weakness EXAM: CT HEAD WITHOUT CONTRAST TECHNIQUE: Contiguous axial images were obtained from the  base of the skull through the vertex without intravenous contrast. COMPARISON:  12/26/2020 FINDINGS: Brain: No acute cortical infarction. Periventricular white matter changes, likely the sequela of chronic small vessel ischemic disease, with redemonstrated small vessel infarcts in the bilateral periventricular white matter, basal ganglia, left pons, and right cerebellar hemisphere. No acute hemorrhage, mass mass effect, or midline shift. No extra-axial collection or hydrocephalus. Vascular: No hyperdense vessel or unexpected calcification. Skull: Normal. Negative for fracture or focal lesion. Sinuses/Orbits: No acute finding. Other: The mastoid air cells are well aerated. ASPECTS [facility] Stroke Program Early CT Score) - Ganglionic level infarction (caudate, lentiform nuclei, internal capsule, insula, M1-M3 cortex): 7 - Supraganglionic infarction (M4-M6 cortex): 3 Total score (0-10 with 10 being normal): 10 IMPRESSION: 1. No acute intracranial process. 2. ASPECTS is 10 Code stroke imaging results were communicated on 02/16/2021 at 8:27 pm to provider Dr. Lorrin Goodell via secure text paging. Electronically Signed   By: Merilyn Baba M.D.   On: 02/16/2021 20:29    EKG: Independently reviewed.  EKG shows sinus tachycardia with occasional PACs.  No acute ST elevation or depression.  QTc prolonged at 580  Assessment/Plan Principal Problem:   Stroke  Mr. Feimster is admitted on cardia  telemetry floor for TIA/CVA.  Will obtain MRI of brain. Pt is refusing MRI of brain tonight and states he will do it in the am.  He reports he had a bad experience with MRI at [facility] in the past and feels like he has been through too much tonight and we will not proceed with MRI until the morning.  Obtain echocardiogram to evaluate for PFO, wall motion and ejection fraction. Hypertension of 220/110 will be allowed for 24 hours per stroke protocol.  After which blood pressure will be slowly reduced to goal level. Antiplatelet therapy with aspirin daily. Continue statin therapy and increase dose to lipitor 40 mg.  Check lipid panel.  Neurochecks per stroke protocol Patient is coughing when he tries to drink from a cup or with a straw.  We will keep n.p.o. until speech therapy evaluation for complete swallow study  Active Problems:   Essential hypertension Patient a lot of permissive hypertension until morning and then home antihypertensive medications can be resumed Monitor blood pressure    Diabetes mellitus type 2 in obese Patient given Jardiance in place of Januvia.  Metformin held for 48 hours after receiving IV contrast for CT scan To blood sugars every 6 hours while NPO and provide corrective insulin as needed. Check Hemoglobin A1c    Elevated troponin Patient with elevated troponin with no acute ischemic changes on EKG.  No complaints of chest pain. Serial troponin levels.  Repeat EKG at 7 AM      Hypokalemia Potassium mildly low at 3.1.  Will supplement.  Magnesium level is normal. Recheck electrolytes and renal function in morning with labs     Prolonged QT interval Medications were to further prolong QT interval   DVT prophylaxis: SCDs for DVT prophylaxis Code Status:   Full code Family Communication:  Diagnosis and plan discussed with patient.  Verbalized understanding and agrees with the plan but refuses to have  the MRI until morning.  Disposition Plan:    Patient is from:  Home  Anticipated DC to:  Home versus rehab, to be determined  Anticipated DC date:  Anticipate 2 midnight or more stay in the hospital  Consults called:  Neurology consulted by ER PA  Admission status:  Inpatient   Yevonne Aline  Harout Scheurich MD [facility]  How to contact the [facility] Attending or Consulting provider [place] or covering provider during after hours [place], for this patient?   Check the care team in [facility] and look for a) attending/consulting [facility] provider listed and b) the [facility] team listed Log into [email or web address] and use [place]'s universal password to access. If you do not have the password, please contact the hospital operator. Locate the [facility] provider you are looking for under [facility] and page to a number that you can be directly reached. If you still have difficulty reaching the provider, please page the [facility] (Director on Call) for the Hospitalists listed on amion for assistance.  02/17/2021, 12:56 AM

## 2021-02-17 NOTE — ED Notes (Signed)
Changed pt over to hospital bed.

## 2021-02-18 DIAGNOSIS — R778 Other specified abnormalities of plasma proteins: Secondary | ICD-10-CM

## 2021-02-18 DIAGNOSIS — E669 Obesity, unspecified: Secondary | ICD-10-CM

## 2021-02-18 DIAGNOSIS — I10 Essential (primary) hypertension: Secondary | ICD-10-CM

## 2021-02-18 DIAGNOSIS — R9431 Abnormal electrocardiogram [ECG] [EKG]: Secondary | ICD-10-CM

## 2021-02-18 DIAGNOSIS — I63231 Cerebral infarction due to unspecified occlusion or stenosis of right carotid arteries: Secondary | ICD-10-CM

## 2021-02-18 DIAGNOSIS — E1169 Type 2 diabetes mellitus with other specified complication: Secondary | ICD-10-CM

## 2021-02-18 LAB — BASIC METABOLIC PANEL
Anion gap: 10 (ref 5–15)
BUN: 7 mg/dL — ABNORMAL LOW (ref 8–23)
CO2: 28 mmol/L (ref 22–32)
Calcium: 8.8 mg/dL — ABNORMAL LOW (ref 8.9–10.3)
Chloride: 103 mmol/L (ref 98–111)
Creatinine, Ser: 0.71 mg/dL (ref 0.61–1.24)
GFR, Estimated: >60 mL/min (ref >60)
Glucose, Bld: 124 mg/dL — ABNORMAL HIGH (ref 70–99)
Potassium: 2.9 mmol/L — ABNORMAL LOW (ref 3.5–5.1)
Sodium: 141 mmol/L (ref 135–145)

## 2021-02-18 LAB — PROTIME-INR
INR: 1.2 (ref 0.8–1.2)
Prothrombin Time: 14.7 seconds (ref 11.4–15.2)

## 2021-02-18 LAB — CBC WITH DIFFERENTIAL/PLATELET
Abs Immature Granulocytes: 0.02 10*3/uL (ref 0.00–0.07)
Basophils Absolute: 0.1 10*3/uL (ref 0.0–0.1)
Basophils Relative: 1 %
Eosinophils Absolute: 0.2 10*3/uL (ref 0.0–0.5)
Eosinophils Relative: 2 %
HCT: 47.7 % (ref 39.0–52.0)
Hemoglobin: 15.6 g/dL (ref 13.0–17.0)
Immature Granulocytes: 0 %
Lymphocytes Relative: 29 %
Lymphs Abs: 2.6 10*3/uL (ref 0.7–4.0)
MCH: 30 pg (ref 26.0–34.0)
MCHC: 32.7 g/dL (ref 30.0–36.0)
MCV: 91.7 fL (ref 80.0–100.0)
Monocytes Absolute: 1.1 10*3/uL — ABNORMAL HIGH (ref 0.1–1.0)
Monocytes Relative: 12 %
Neutro Abs: 5 10*3/uL (ref 1.7–7.7)
Neutrophils Relative %: 56 %
Platelets: 334 10*3/uL (ref 150–400)
RBC: 5.2 MIL/uL (ref 4.22–5.81)
RDW: 15.9 % — ABNORMAL HIGH (ref 11.5–15.5)
WBC: 9 10*3/uL (ref 4.0–10.5)
nRBC: 0 % (ref 0.0–0.2)

## 2021-02-18 LAB — GLUCOSE, CAPILLARY
Glucose-Capillary: 137 mg/dL — ABNORMAL HIGH (ref 70–99)
Glucose-Capillary: 139 mg/dL — ABNORMAL HIGH (ref 70–99)
Glucose-Capillary: 182 mg/dL — ABNORMAL HIGH (ref 70–99)
Glucose-Capillary: 221 mg/dL — ABNORMAL HIGH (ref 70–99)
Glucose-Capillary: 243 mg/dL — ABNORMAL HIGH (ref 70–99)

## 2021-02-18 MED ORDER — HYDRALAZINE HCL 25 MG PO TABS
ORAL_TABLET | ORAL | Status: AC
  Filled 2021-02-18: qty 1

## 2021-02-18 MED ORDER — LIVING WELL WITH DIABETES BOOK
Freq: Once | Status: DC
  Filled 2021-02-18: qty 1

## 2021-02-18 MED ORDER — LABETALOL HCL 5 MG/ML IV SOLN
INTRAVENOUS | Status: AC
  Filled 2021-02-18: qty 4

## 2021-02-18 MED ORDER — LABETALOL HCL 5 MG/ML IV SOLN
10.0000 mg | Freq: Once | INTRAVENOUS | Status: AC
  Administered 2021-02-18: 10 mg via INTRAVENOUS

## 2021-02-18 MED ORDER — CLOPIDOGREL BISULFATE 75 MG PO TABS
ORAL_TABLET | ORAL | Status: AC
  Administered 2021-02-19: 75 mg via ORAL
  Filled 2021-02-18: qty 1

## 2021-02-18 MED ORDER — POTASSIUM CHLORIDE 10 MEQ/100ML IV SOLN
10.0000 meq | INTRAVENOUS | Status: AC
  Administered 2021-02-18 (×6): 10 meq via INTRAVENOUS

## 2021-02-18 MED ORDER — ASPIRIN EC 81 MG PO TBEC: 81.0000 mg | DELAYED_RELEASE_TABLET | Freq: Every day | ORAL | Status: DC

## 2021-02-18 MED ORDER — ENOXAPARIN SODIUM 40 MG/0.4ML IJ SOSY
40.0000 mg | PREFILLED_SYRINGE | INTRAMUSCULAR | Status: DC
  Administered 2021-02-18 – 2021-02-26 (×8): 40 mg via SUBCUTANEOUS
  Filled 2021-02-18 (×8): qty 0.4

## 2021-02-18 MED ORDER — POTASSIUM CHLORIDE 10 MEQ/100ML IV SOLN
INTRAVENOUS | Status: AC
  Filled 2021-02-18: qty 100

## 2021-02-18 MED ORDER — ENOXAPARIN SODIUM 40 MG/0.4ML IJ SOSY
PREFILLED_SYRINGE | INTRAMUSCULAR | Status: AC
  Administered 2021-02-19: 40 mg via SUBCUTANEOUS
  Filled 2021-02-18: qty 0.4

## 2021-02-18 MED ORDER — POTASSIUM CHLORIDE CRYS ER 20 MEQ PO TBCR
EXTENDED_RELEASE_TABLET | ORAL | Status: AC
  Administered 2021-02-19: 40 meq via ORAL
  Filled 2021-02-18: qty 2

## 2021-02-18 MED ORDER — CLOPIDOGREL BISULFATE 75 MG PO TABS
75.0000 mg | ORAL_TABLET | Freq: Every day | ORAL | Status: DC
  Administered 2021-02-18 – 2021-02-27 (×9): 75 mg via ORAL
  Filled 2021-02-18 (×9): qty 1

## 2021-02-18 MED ORDER — INSULIN ASPART 100 UNIT/ML IJ SOLN
0.0000 [IU] | Freq: Three times a day (TID) | INTRAMUSCULAR | Status: DC
  Administered 2021-02-18: 3 [IU] via SUBCUTANEOUS
  Administered 2021-02-18 – 2021-02-19 (×2): 2 [IU] via SUBCUTANEOUS
  Administered 2021-02-19: 1 [IU] via SUBCUTANEOUS
  Administered 2021-02-19 – 2021-02-20 (×3): 3 [IU] via SUBCUTANEOUS
  Administered 2021-02-20: 5 [IU] via SUBCUTANEOUS
  Administered 2021-02-21 (×3): 3 [IU] via SUBCUTANEOUS
  Administered 2021-02-22 (×2): 2 [IU] via SUBCUTANEOUS
  Administered 2021-02-22: 3 [IU] via SUBCUTANEOUS
  Administered 2021-02-23 – 2021-02-25 (×7): 2 [IU] via SUBCUTANEOUS
  Administered 2021-02-25: 1 [IU] via SUBCUTANEOUS
  Administered 2021-02-26 – 2021-02-27 (×3): 2 [IU] via SUBCUTANEOUS

## 2021-02-18 MED ORDER — ASPIRIN EC 325 MG PO TBEC
325.0000 mg | DELAYED_RELEASE_TABLET | Freq: Every day | ORAL | Status: DC
  Administered 2021-02-19 – 2021-02-27 (×9): 325 mg via ORAL
  Filled 2021-02-18 (×9): qty 1

## 2021-02-18 MED ORDER — POTASSIUM CHLORIDE CRYS ER 20 MEQ PO TBCR
40.0000 meq | EXTENDED_RELEASE_TABLET | Freq: Once | ORAL | Status: AC
  Administered 2021-02-18: 40 meq via ORAL

## 2021-02-18 NOTE — Progress Notes (Signed)
Notified MD of pt's BP   BP taken at 0310 - 176/103 (120)  Labetalol 10 mg IV ordered  Pt is A&O x4... no acute distress.

## 2021-02-18 NOTE — Evaluation (Signed)
Physical Therapy Evaluation Patient Details Name: Brent Young MRN: [id number] DOB: Sep 16, 1960 Today's Date: 02/18/2021  History of Present Illness  Pt is a 60 y/o male presenting w/ LUE and LLE weakness. 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 PMH significant for lacunar CVA's, DM II, HTN.   Clinical Impression  Pt admitted with above diagnosis. Pt currently with functional limitations due to the deficits listed below (see PT Problem List). At the time of PT eval pt was able to perform transfers with up to +2 max assist for balance support, safety, and management of the L UE/LE. Stedy utilized for OOB to chair with RN present to assist with equipment. +3 total for Stedy bed>chair. Lift pad left in room for nursing staff to transition pt back to bed with [facility] and this was communicated with RN during session. Recommending AIR follow up to maximize functional independence and safety prior to return home with family support. Pt will benefit from skilled PT to increase their independence and safety with mobility to allow discharge to the venue listed below.          Recommendations for follow up therapy are one component of a multi-disciplinary discharge planning process, led by the attending physician.  Recommendations may be updated based on patient status, additional functional criteria and insurance authorization.  Follow Up Recommendations Acute inpatient rehab (3hours/day)    Assistance Recommended at Discharge    Functional Status Assessment Patient has had a recent decline in their functional status and demonstrates the ability to make significant improvements in function in a reasonable and predictable amount of time.  Equipment Recommendations  Wheelchair (measurements PT);Wheelchair cushion (measurements PT)    Recommendations for Other Services Rehab consult     Precautions / Restrictions Precautions Precautions: Fall Precaution Comments:  Dense L side Restrictions Weight Bearing Restrictions: No      Mobility  Bed Mobility Overal bed mobility: Needs Assistance Bed Mobility: Supine to Sit     Supine to sit: Max assist;+2 for physical assistance;HOB elevated     General bed mobility comments: Pt sitting with HOB elevated and was able to swing LE's around to EOB with max assist to transition to EOB. Bed pad utilized to assist. +2 for trunk elevation.    Transfers Overall transfer level: Needs assistance Equipment used: Ambulation equipment used Transfers: Sit to/from Stand;Bed to chair/wheelchair/BSC Sit to Stand: Max assist;+2 physical assistance;From elevated surface           General transfer comment: +2 assist and RN present to assist with equipment. Heavy L lateral lean on Stedy requiring increased assist from therapist during transition from bed>chair. Transfer via Lift Equipment: Microbiologist Rankin (Stroke Patients Only)       Balance Overall balance assessment: Needs assistance Sitting-balance support: Feet supported Sitting balance-Leahy Scale: Zero (approaching poor) Sitting balance - Comments: Max assist at times, especially when sitting within [employer] balance support: Bilateral upper extremity supported Standing balance-Leahy Scale: Zero Standing balance comment: +2 assist required                             Pertinent Vitals/Pain Pain Assessment: No/denies pain    Home Living Family/patient expects to be discharged  to:: Private residence Living Arrangements: Spouse/significant other;Children;Other relatives Available Help at Discharge: Family;Available 24 hours/day Type of Home: House Home Access: Stairs to enter   Entrance Stairs-Number of Steps: 2-3 Alternate Level Stairs-Number of Steps: flight Home Layout: Two level;Bed/bath upstairs Home Equipment: Clinical biochemist (2 wheels);Cane - single point;Shower seat      Prior Function Prior Level of Function : Independent/Modified Independent             Mobility Comments: was not using AD ADLs Comments: son does assist with housework IADLs     Hand Dominance   Dominant Hand: Right    Extremity/Trunk Assessment   Upper Extremity Assessment Upper Extremity Assessment: LUE deficits/detail LUE Deficits / Details: Minimal movement at the hand, wrist, and elbow. LUE Sensation: decreased light touch;decreased proprioception LUE Coordination: decreased fine motor;decreased gross motor    Lower Extremity Assessment Lower Extremity Assessment: LLE deficits/detail LLE Deficits / Details: Trace anterior tibialis, gastroc, and quad activation with movement. LLE Sensation: decreased light touch;decreased proprioception LLE Coordination: decreased fine motor;decreased gross motor    Cervical / Trunk Assessment Cervical / Trunk Assessment: Normal  Communication   Communication: No difficulties  Cognition Arousal/Alertness: Awake/alert Behavior During Therapy: WFL for tasks assessed/performed Overall Cognitive Status: Within Functional Limits for tasks assessed                                          General Comments      Exercises     Assessment/Plan    PT Assessment Patient needs continued PT services  PT Problem List Decreased strength;Decreased range of motion;Decreased activity tolerance;Decreased balance;Decreased mobility;Decreased knowledge of use of DME;Decreased safety awareness;Decreased knowledge of precautions;Impaired sensation       PT Treatment Interventions DME instruction;Gait training;Functional mobility training;Therapeutic activities;Therapeutic exercise;Balance training;Neuromuscular re-education;Patient/family education    PT Goals (Current goals can be found in the Care Plan section)  Acute Rehab PT Goals Patient Stated Goal: Today, goal was  to be up in the chair. Motivated for rehab. PT Goal Formulation: With patient Time For Goal Achievement: 03/04/21 Potential to Achieve Goals: Good    Frequency Min 4X/week   Barriers to discharge        Co-evaluation               AM-PAC PT "6 Clicks" Mobility  Outcome Measure Help needed turning from your back to your side while in a flat bed without using bedrails?: A Lot Help needed moving from lying on your back to sitting on the side of a flat bed without using bedrails?: Total Help needed moving to and from a bed to a chair (including a wheelchair)?: Total Help needed standing up from a chair using your arms (e.g., wheelchair or bedside chair)?: Total Help needed to walk in hospital room?: Total Help needed climbing 3-5 steps with a railing? : Total 6 Click Score: 7    End of Session Equipment Utilized During Treatment: Gait belt Activity Tolerance: Patient tolerated treatment well Patient left: in chair;with call bell/phone within reach;with chair alarm set Nurse Communication: Mobility status;Need for lift equipment (Lift pad left in room for back to bed) PT Visit Diagnosis: Unsteadiness on feet (R26.81);Hemiplegia and hemiparesis Hemiplegia - Right/Left: Left Hemiplegia - dominant/non-dominant: Non-dominant Hemiplegia - caused by: Cerebral infarction    Time: 5625-6389 PT Time Calculation (min) (ACUTE ONLY): 38 min   Charges:   PT Evaluation $  PT Eval Moderate Complexity: 1 Mod PT Treatments $Gait Training: 23-37 mins        Conni Slipper, PT, DPT Acute Rehabilitation Services Pager: [phone] Office: [phone]   Marylynn Pearson 02/18/2021, 3:29 PM

## 2021-02-18 NOTE — Progress Notes (Addendum)
STROKE TEAM PROGRESS NOTE   INTERVAL HISTORY No family is at the bedside. Patient is resting in his hospital bed. Patient was determined to eat today so he requested that his cerebral angiogram takes place tomorrow instead (11/30). We explained that we really want him to have this done tomorrow for his safety.   OBJECTIVE Vitals:   02/18/21 1009 02/18/21 1224 02/18/21 1226 02/18/21 1300  BP: (!) 173/93 (!) 158/87 (!) 156/86   Pulse:  (!) 110 (!) 111   Resp:  20 20   Temp:  98.5 F (36.9 C) 98.1 F (36.7 C)   TempSrc:  Oral Oral   SpO2:  92% (!) 89% 93%  Weight:      Height:        CBC:  Recent Labs  Lab 02/16/21 2100 02/18/21 0353  WBC 12.4* 9.0  NEUTROABS 8.4* 5.0  HGB 16.8 15.6  HCT 49.8 47.7  MCV 91.5 91.7  PLT 341 334     Basic Metabolic Panel:  Recent Labs  Lab 02/16/21 2100 02/18/21 0353  NA 137 141  K 3.1* 2.9*  CL 98 103  CO2 27 28  GLUCOSE 181* 124*  BUN 7* 7*  CREATININE 0.88 0.71  CALCIUM 9.2 8.8*  MG 1.8  --      Lipid Panel:     Component Value Date/Time   CHOL 148 02/17/2021 0328   CHOL 141 10/08/2020 1140   TRIG 120 02/17/2021 0328   HDL 26 (L) 02/17/2021 0328   HDL 43 10/08/2020 1140   CHOLHDL 5.7 02/17/2021 0328   VLDL 24 02/17/2021 0328   LDLCALC 98 02/17/2021 0328   LDLCALC 75 10/08/2020 1140   HgbA1c:  Lab Results  Component Value Date   HGBA1C 9.9 (H) 02/17/2021   Urine Drug Screen:     Component Value Date/Time   LABOPIA NONE DETECTED 02/17/2021 0831   COCAINSCRNUR NONE DETECTED 02/17/2021 0831   LABBENZ NONE DETECTED 02/17/2021 0831   AMPHETMU NONE DETECTED 02/17/2021 0831   THCU POSITIVE (A) 02/17/2021 0831   LABBARB NONE DETECTED 02/17/2021 0831    Alcohol Level No results found for: ETH  IMAGING   CT ANGIO HEAD W OR WO CONTRAST  Result Date: 02/16/2021 CLINICAL DATA:  Left-sided weakness EXAM: CT ANGIOGRAPHY HEAD AND NECK TECHNIQUE: Multidetector CT imaging of the head and neck was performed using the  standard protocol during bolus administration of intravenous contrast. Multiplanar CT image reconstructions and MIPs were obtained to evaluate the vascular anatomy. Carotid stenosis measurements (when applicable) are obtained utilizing NASCET criteria, using the distal internal carotid diameter as the denominator. CONTRAST:  79mL OMNIPAQUE IOHEXOL 350 MG/ML SOLN COMPARISON:  02/28/2020, correlation is made with same day CT head FINDINGS: CT HEAD FINDINGS For noncontrast findings, please see same day CT head. CTA NECK FINDINGS Aortic arch: Two-vessel arch with a common origin of the brachiocephalic and left common carotid arteries. Imaged portion shows no evidence of aneurysm or dissection. No significant stenosis of the major arch vessel origins. Moderate calcified and noncalcified plaque. Right carotid system: No evidence of dissection, stenosis (50% or greater) or occlusion. Mild narrowing at the bifurcation, secondary to calcified and noncalcified plaque, which is not hemodynamically significant. Left carotid system: 65-70% stenosis of the proximal left ICA, secondary to calcified and noncalcified plaque. No evidence of dissection or occlusion. Vertebral arteries: Severe stenosis at the origin of the right vertebral artery (series 8, images 295-300), which appears worse than on the prior exam. Minimal flow in the  right V2 and V3 segments. The extracranial left vertebral artery is without significant stenosis. Skeleton: No acute osseous abnormality. Degenerative changes in the cervical spine. Other neck: Negative. Upper chest: Mild ground-glass opacities, which are nonspecific may represent pulmonary edema. No pleural effusion. Unchanged prominent mediastinal lymph nodes. Review of the MIP images confirms the above findings CTA HEAD FINDINGS Anterior circulation: Redemonstrated moderate to severe calcified plaque in the carotid siphons, right greater than left, overall unchanged. A1 segments patent. Normal  anterior communicating artery. Anterior cerebral arteries are patent to their distal aspects. No M1 stenosis or occlusion. Normal MCA bifurcations. Distal MCA branches perfused and symmetric. Posterior circulation: Focal plaque in the left V4, which is otherwise patent. The diminutive right V4 is poorly opacified intracranially, with near complete occlusion just proximal to the take-off of the right PICA and only intermittent faint flow to the vertebrobasilar junction, some of which may be retrograde. The right PICA is not opacified. The left PICA is patent. Basilar patent to its distal aspect. Superior cerebellar arteries patent bilaterally. PCAs perfused to their distal aspects without stenosis. The bilateral posterior communicating arteries are not visualized Venous sinuses: As permitted by contrast timing, patent. Anatomic variants: None significant Review of the MIP images confirms the above findings IMPRESSION: 1. Poor opacification of the right vertebral artery throughout its course, with increased focal stenosis just distal to its take-off. The right V4 is likely occluded proximal to the takeoff of the right PICA, with no definitive flow seen in the right PICA, which is new from prior exam. 2. Redemonstrated moderate to severe calcified plaque in the carotid siphons, right greater than left, overall unchanged. 3. 65-70% stenosis of the proximal left ICA, secondary to calcified and noncalcified plaque. Electronically Signed   By: Wiliam Ke M.D.   On: 02/16/2021 21:08   DG Chest 1 View  Result Date: 02/16/2021 CLINICAL DATA:  Status post fall with left-sided weakness. EXAM: CHEST  1 VIEW COMPARISON:  December 26, 2020 FINDINGS: Mild to moderate severity, predominant stable areas of scarring and/or atelectasis are noted within the bilateral lung bases there is no evidence of a pleural effusion or pneumothorax. The cardiac silhouette is mildly enlarged and unchanged in size. The visualized skeletal  structures are unremarkable. IMPRESSION: Stable cardiomegaly with mild to moderate severity, predominant stable areas of bibasilar scarring and/or atelectasis. Electronically Signed   By: Aram Candela M.D.   On: 02/16/2021 23:51   DG Shoulder Right  Result Date: 02/16/2021 CLINICAL DATA:  Fall. EXAM: RIGHT SHOULDER - 2+ VIEW COMPARISON:  Right shoulder x-ray 05/02/2020 FINDINGS: Healed greater tuberosity fracture is present. No definite acute fracture or dislocation. There are stable mild degenerative changes of the acromioclavicular joint. There are vascular calcifications in the soft tissues. IMPRESSION: 1. No acute bony abnormality. Electronically Signed   By: Darliss Cheney M.D.   On: 02/16/2021 23:38   CT ANGIO NECK W OR WO CONTRAST  Result Date: 02/16/2021 CLINICAL DATA:  Left-sided weakness EXAM: CT ANGIOGRAPHY HEAD AND NECK TECHNIQUE: Multidetector CT imaging of the head and neck was performed using the standard protocol during bolus administration of intravenous contrast. Multiplanar CT image reconstructions and MIPs were obtained to evaluate the vascular anatomy. Carotid stenosis measurements (when applicable) are obtained utilizing NASCET criteria, using the distal internal carotid diameter as the denominator. CONTRAST:  75mL OMNIPAQUE IOHEXOL 350 MG/ML SOLN COMPARISON:  02/28/2020, correlation is made with same day CT head FINDINGS: CT HEAD FINDINGS For noncontrast findings, please see same day CT  head. CTA NECK FINDINGS Aortic arch: Two-vessel arch with a common origin of the brachiocephalic and left common carotid arteries. Imaged portion shows no evidence of aneurysm or dissection. No significant stenosis of the major arch vessel origins. Moderate calcified and noncalcified plaque. Right carotid system: No evidence of dissection, stenosis (50% or greater) or occlusion. Mild narrowing at the bifurcation, secondary to calcified and noncalcified plaque, which is not hemodynamically  significant. Left carotid system: 65-70% stenosis of the proximal left ICA, secondary to calcified and noncalcified plaque. No evidence of dissection or occlusion. Vertebral arteries: Severe stenosis at the origin of the right vertebral artery (series 8, images 295-300), which appears worse than on the prior exam. Minimal flow in the right V2 and V3 segments. The extracranial left vertebral artery is without significant stenosis. Skeleton: No acute osseous abnormality. Degenerative changes in the cervical spine. Other neck: Negative. Upper chest: Mild ground-glass opacities, which are nonspecific may represent pulmonary edema. No pleural effusion. Unchanged prominent mediastinal lymph nodes. Review of the MIP images confirms the above findings CTA HEAD FINDINGS Anterior circulation: Redemonstrated moderate to severe calcified plaque in the carotid siphons, right greater than left, overall unchanged. A1 segments patent. Normal anterior communicating artery. Anterior cerebral arteries are patent to their distal aspects. No M1 stenosis or occlusion. Normal MCA bifurcations. Distal MCA branches perfused and symmetric. Posterior circulation: Focal plaque in the left V4, which is otherwise patent. The diminutive right V4 is poorly opacified intracranially, with near complete occlusion just proximal to the take-off of the right PICA and only intermittent faint flow to the vertebrobasilar junction, some of which may be retrograde. The right PICA is not opacified. The left PICA is patent. Basilar patent to its distal aspect. Superior cerebellar arteries patent bilaterally. PCAs perfused to their distal aspects without stenosis. The bilateral posterior communicating arteries are not visualized Venous sinuses: As permitted by contrast timing, patent. Anatomic variants: None significant Review of the MIP images confirms the above findings IMPRESSION: 1. Poor opacification of the right vertebral artery throughout its course,  with increased focal stenosis just distal to its take-off. The right V4 is likely occluded proximal to the takeoff of the right PICA, with no definitive flow seen in the right PICA, which is new from prior exam. 2. Redemonstrated moderate to severe calcified plaque in the carotid siphons, right greater than left, overall unchanged. 3. 65-70% stenosis of the proximal left ICA, secondary to calcified and noncalcified plaque. Electronically Signed   By: Wiliam Ke M.D.   On: 02/16/2021 21:08   MR BRAIN WO CONTRAST  Result Date: 02/17/2021 CLINICAL DATA:  Stroke follow-up EXAM: MRI HEAD WITHOUT CONTRAST TECHNIQUE: Multiplanar, multiecho pulse sequences of the brain and surrounding structures were obtained without intravenous contrast. COMPARISON:  CT and CTA from yesterday FINDINGS: Brain: Wedges of restricted diffusion spanning the right corona radiata anteriorly and extending from the posterior putamen to the caudate nucleus posteriorly. Lacune with facilitated peripheral diffusion in the periventricular white matter on the right. Extensive chronic small vessel ischemia in the hemispheric white matter with confluent periventricular ischemic gliosis and multiple chronic lacunar infarcts. Small remote right cerebellar infarction. Vascular: Absent right vertebral flow void correlating with prior CTA. Skull and upper cervical spine: Normal marrow signal. Sinuses/Orbits: Negative. IMPRESSION: 1. Acute to subacute perforator infarcts at the right basal ganglia and corona radiata. 2. Extensive chronic small vessel ischemia. Electronically Signed   By: Tiburcio Pea M.D.   On: 02/17/2021 06:38   DG Knee Complete 4 Views  Left  Result Date: 02/16/2021 CLINICAL DATA:  Status post fall. EXAM: LEFT KNEE - COMPLETE 4+ VIEW COMPARISON:  None. FINDINGS: No evidence of an acute fracture or dislocation. No evidence of arthropathy or other focal bone abnormality. Moderate to marked severity vascular calcification is seen.  A trace joint effusion is seen. IMPRESSION: 1. No acute osseous abnormality. 2. Trace joint effusion. Electronically Signed   By: Aram Candela M.D.   On: 02/16/2021 23:52   DG Knee Complete 4 Views Right  Result Date: 02/16/2021 CLINICAL DATA:  Status post fall. EXAM: RIGHT KNEE - COMPLETE 4+ VIEW COMPARISON:  None. FINDINGS: No evidence of an acute fracture or dislocation. No evidence of arthropathy or other focal bone abnormality. Moderate to marked severity vascular calcification is seen. A very small joint effusion is seen. IMPRESSION: 1. No acute fracture or dislocation. 2. Very small joint effusion. Electronically Signed   By: Aram Candela M.D.   On: 02/16/2021 23:47   ECHOCARDIOGRAM COMPLETE  Result Date: 02/17/2021    ECHOCARDIOGRAM REPORT   Patient Name:   Brent Young Date of Exam: 02/17/2021 Medical Rec #:  [id number]        Height:       73.0 in Accession #:    [id number]       Weight:       275.0 lb Date of Birth:  Nov 26, 1960        BSA:          2.463 m Patient Age:    60 years         BP:           188/110 mmHg Patient Gender: M                HR:           97 bpm. Exam Location:  Inpatient Procedure: 2D Echo, Cardiac Doppler, Color Doppler and Intracardiac            Opacification Agent Indications:    Stroke  History:        Patient has no prior history of Echocardiogram examinations. 2nd                 stroke.  Sonographer:    Roosvelt Maser RDCS Referring Phys: [id number] BRADLEY S CHOTINER IMPRESSIONS  1. Left ventricular ejection fraction, by estimation, is 60 to 65%. The left ventricle has normal function. The left ventricle has no regional wall motion abnormalities. There is moderate left ventricular hypertrophy. Indeterminate diastolic filling due  to E-A fusion.  2. Right ventricular systolic function is normal. The right ventricular size is normal. There is normal pulmonary artery systolic pressure.  3. Mild mitral valve regurgitation.  4. AV is thickened, calcified. Peak  and mean gradients through the valveare 24 and 13 mm Hg respectively AVA (VTI) is 1.87 cm2 Dimensionless index is 0.46 COnsistent with mild AS. Marland Kitchen The aortic valve is tricuspid. Aortic valve regurgitation is not visualized. Aortic valve sclerosis/calcification is present, without any evidence of aortic stenosis. FINDINGS  Left Ventricle: Left ventricular ejection fraction, by estimation, is 60 to 65%. The left ventricle has normal function. The left ventricle has no regional wall motion abnormalities. The left ventricular internal cavity size was normal in size. There is  moderate left ventricular hypertrophy. Indeterminate diastolic filling due to E-A fusion. Right Ventricle: The right ventricular size is normal. Right vetricular wall thickness was not assessed. Right ventricular systolic function is normal. There is normal pulmonary artery  systolic pressure. The tricuspid regurgitant velocity is 2.70 m/s, and with an assumed right atrial pressure of 3 mmHg, the estimated right ventricular systolic pressure is 32.2 mmHg. Left Atrium: Left atrial size was normal in size. Right Atrium: Right atrial size was normal in size. Pericardium: Trivial pericardial effusion is present. Mitral Valve: There is mild thickening of the mitral valve leaflet(s). Mild mitral annular calcification. Mild mitral valve regurgitation. Tricuspid Valve: The tricuspid valve is normal in structure. Tricuspid valve regurgitation is trivial. Aortic Valve: AV is thickened, calcified. Peak and mean gradients through the valveare 24 and 13 mm Hg respectively AVA (VTI) is 1.87 cm2 Dimensionless index is 0.46 COnsistent with mild AS. The aortic valve is tricuspid. Aortic valve regurgitation is not visualized. Aortic valve sclerosis/calcification is present, without any evidence of aortic stenosis. Aortic valve mean gradient measures 12.5 mmHg. Aortic valve peak gradient measures 23.7 mmHg. Aortic valve area, by VTI measures 1.90 cm. Pulmonic Valve:  The pulmonic valve was normal in structure. Pulmonic valve regurgitation is not visualized. Aorta: The aortic root and ascending aorta are structurally normal, with no evidence of dilitation. IAS/Shunts: No atrial level shunt detected by color flow Doppler.  LEFT VENTRICLE PLAX 2D LVIDd:         4.90 cm LVIDs:         3.30 cm LV PW:         1.60 cm LV IVS:        1.60 cm LVOT diam:     2.30 cm LV SV:         86 LV SV Index:   35 LVOT Area:     4.15 cm  RIGHT VENTRICLE RV Basal diam:  4.70 cm RV Mid diam:    3.80 cm LEFT ATRIUM              Index        RIGHT ATRIUM           Index LA diam:        4.90 cm  1.99 cm/m   RA Area:     21.70 cm LA Vol (A2C):   124.0 ml 50.34 ml/m  RA Volume:   60.60 ml  24.60 ml/m LA Vol (A4C):   103.0 ml 41.81 ml/m LA Biplane Vol: 114.0 ml 46.28 ml/m  AORTIC VALVE AV Area (Vmax):    1.96 cm AV Area (Vmean):   1.94 cm AV Area (VTI):     1.90 cm AV Vmax:           243.50 cm/s AV Vmean:          164.500 cm/s AV VTI:            0.456 m AV Peak Grad:      23.7 mmHg AV Mean Grad:      12.5 mmHg LVOT Vmax:         115.00 cm/s LVOT Vmean:        76.700 cm/s LVOT VTI:          0.208 m LVOT/AV VTI ratio: 0.46  AORTA Ao Root diam: 3.40 cm Ao Asc diam:  3.30 cm TRICUSPID VALVE TR Peak grad:   29.2 mmHg TR Vmax:        270.00 cm/s  SHUNTS Systemic VTI:  0.21 m Systemic Diam: 2.30 cm Dietrich Pates MD Electronically signed by Dietrich Pates MD Signature Date/Time: 02/17/2021/5:10:24 PM    Final    CT HEAD CODE STROKE WO CONTRAST  Result Date: 02/16/2021  CLINICAL DATA:  Code stroke.  Left-sided weakness EXAM: CT HEAD WITHOUT CONTRAST TECHNIQUE: Contiguous axial images were obtained from the base of the skull through the vertex without intravenous contrast. COMPARISON:  12/26/2020 FINDINGS: Brain: No acute cortical infarction. Periventricular white matter changes, likely the sequela of chronic small vessel ischemic disease, with redemonstrated small vessel infarcts in the bilateral  periventricular white matter, basal ganglia, left pons, and right cerebellar hemisphere. No acute hemorrhage, mass mass effect, or midline shift. No extra-axial collection or hydrocephalus. Vascular: No hyperdense vessel or unexpected calcification. Skull: Normal. Negative for fracture or focal lesion. Sinuses/Orbits: No acute finding. Other: The mastoid air cells are well aerated. ASPECTS [facility] Stroke Program Early CT Score) - Ganglionic level infarction (caudate, lentiform nuclei, internal capsule, insula, M1-M3 cortex): 7 - Supraganglionic infarction (M4-M6 cortex): 3 Total score (0-10 with 10 being normal): 10 IMPRESSION: 1. No acute intracranial process. 2. ASPECTS is 10 Code stroke imaging results were communicated on 02/16/2021 at 8:27 pm to provider Dr. Derry Lory via secure text paging. Electronically Signed   By: Wiliam Ke M.D.   On: 02/16/2021 20:29      ECG - ST rate 110 BPM. (See cardiology reading for complete details)  PHYSICAL EXAM Blood pressure (!) 156/86, pulse (!) 111, temperature 98.1 F (36.7 C), temperature source Oral, resp. rate 20, height  (1.854 m), weight 124.7 kg, SpO2 93 %.  Temp:  [97.5 F (36.4 C)-98.8 F (37.1 C)] 98.1 F (36.7 C) (11/29 1226) Pulse Rate:  [92-111] 111 (11/29 1226) Resp:  [16-23] 20 (11/29 1226) BP: (156-204)/(86-109) 156/86 (11/29 1226) SpO2:  [89 %-100 %] 93 % (11/29 1300)  General - Well nourished, well developed, in no apparent distress.  Ophthalmologic - fundi not visualized due to noncooperation.  Cardiovascular - Regular rhythm and rate, Hypertensive.  Mental Status -  Level of arousal and orientation to time, place, and person were intact. Language including expression, naming, repetition, comprehension was assessed and found intact. Attention span and concentration were normal. Recent and remote memory were intact. Fund of Knowledge was assessed and was intact.  Cranial Nerves II - XII - II - Visual field intact  OU. III, IV, VI - Extraocular movements intact. V - Facial sensation intact bilaterally. VII - Facial movement intact bilaterally. VIII - Hearing & vestibular intact bilaterally. X - Palate elevates symmetrically. XI - Chin turning & shoulder shrug intact bilaterally. XII - Tongue protrusion intact.  Motor Strength - Bulk was normal and fasciculations were absent No movement in left lower extremity.  5/5 strength in right extremities.  LUE at shoulder 0/5, Left hand grip strength 3/5 LLE 0/5, at knee 0/5  Motor Tone - Muscle tone was assessed at the neck and appendages and was normal.  Reflexes - The patient's reflexes were symmetrical in all extremities and he had no pathological reflexes.  Sensory - Light touch, temperature/pinprick were assessed and were symmetrical Decreased sensation to light touch in LUE  Coordination - FTN unable to complete due to weakness in LUE HTS deferred due to plegia in LLE Rapid alternating movement are slowed in LUE  Gait and Station - deferred.   ASSESSMENT/PLAN Brent Young is a 60 y.o. 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 He rolled out of the bed and fell on the floor.  He was unable to get up and stayed on the floor all day. On exam, patient AAOx3, no aphasia, follows simple commands.  Found to have left hemiplegia and hemiparesthesia.  Etiology for current stroke likely due to right ICA siphon high-grade stenosis.  Plan for cerebral angiogram in tomorrow. with Dr. Sherlon Handing.     Stroke: acute to subacute perforator infarcts at the right basal ganglia and corona radiata secondary to right ICA siphon severe stenosis in the setting of multiple uncontrolled risk factors CT Head - No acute intracranial process. ASPECTS is 10. MRI head - Acute to subacute perforator  infarcts at the right basal ganglia and corona radiata. Extensive chronic small vessel ischemia. 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 Re-demonstrated moderate to severe calcified plaque in the carotid siphons, right greater than left, overall unchanged. 65-70% stenosis of the proximal left ICA 2D Echo - EF 60 to 65% LDL - 98 HgbA1c -9.9 UDS - positive for THC VTE prophylaxis - Lovenox No antithrombotic prior to admission, ASA 325 and Plavix 75mg  DAPT for now Patient will be counseled to be compliant with his antithrombotic medications Ongoing aggressive stroke risk factor management Therapy recommendations:  CIR Disposition:  Pending  Intracranial vascular stenosis 02/2020 CTA head and neck left ICA 75% stenosis, bilateral siphon moderate to severe stenosis right more than left This admission re-demonstrated moderate to severe calcified plaque in the carotid siphons, right greater than left, overall unchanged. 65-70% stenosis of the proximal left ICA Plan for cerebral angiogram with Dr. 03/2020 tomorrow Consider DAPT for now  History of stroke   02/2020 admitted at [facility] for slurred speech, right facial droop, and fall.  CT possible left pontine infarct.  CTA head and neck left ICA 75% stenosis, bilateral siphon moderate to severe stenosis right more than left, right VA diminutive and occluded after PICA takeoff.  MRI left pontine infarct and left periventricular white matter small infarcts, as well as old lacunar infarcts.  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  A1c 10.4, LDL 72, renal artery ultrasound negative.  Discharged on aspirin Plavix DAPT for 90 days and Lipitor 10. 12/2020 admitted for confusion, CT negative, EF 55 to 60%, THC positive, alcohol 33.  A1c 8.9, DVT negative.  MRI was not done due to claustrophobia.   Patient discharged on aspirin 325 and Lipitor.  Hypertension Home BP meds: Cozaar, HCTZ, Toprol, Norvasc Stable BP goal 130-160 given severe intravascular stenosis before intervention. Long-term BP goal normotensive after intervention  Hyperlipidemia Home Lipid lowering medication: Lipitor 10 mg daily LDL 98, goal < 70 Current lipid lowering medication: Lipitor 40 mg daily Continue statin at discharge  Diabetes, uncontrolled Home diabetic meds: Tradjenta , metformin, Amaryl Current diabetic meds: Tradjenta HgbA1c -9.9 goal < 7.0 SSI CBG monitoring Close PCP follow-up for better DM control  Tobacco abuse Current smoker Smoking cessation counseling provided Pt is willing to quit  Other Stroke Risk Factors ETOH use, advised to drink no more than 1 alcoholic beverage per day. Obesity, Body mass index is 36.28 kg/m., recommend weight loss, diet and exercise as appropriate  Substance use - marijuana   Other Active Problems Code status - Full code Tachycardia - low 100's Leukocytosis - WBC's - 12.4 (afebrile) Hypokalemia - 3.1 - supplemented History of cirrhosis - currently LFTs WNL and INR normal  Hospital day # 1  Elmer Picker, FNP-BC [facility]  Patient seen by APP and MD. Patient assessed with MD  ATTENDING NOTE: I reviewed above note and agree with the assessment and plan. Pt was seen and examined.   No family at bedside.  Patient lying in bed, family had breakfast, however not able to proceed with angiogram today, planned for tomorrow.  N.p.o. after midnight.  She had a multiple uncontrolled risk factors including diabetes, hypertension, hyperlipidemia and smoking.  Aggressive risk factor modification education provided.  Continue DAPT for now.  PT/OT recommend CIR.  For detailed assessment and plan, please refer to above as I have made changes wherever appropriate.   Marvel Plan, MD PhD Stroke Neurology 02/18/2021 7:31 PM    To contact Stroke  Continuity provider, please refer to [email or web address]. After hours, contact General Neurology

## 2021-02-18 NOTE — TOC CAGE-AID Note (Signed)
Transition of Care [facility]) - CAGE-AID Screening   Patient Details  Name: Brent Young MRN: [id number] Date of Birth: 17-Aug-1960  Transition [facility]) CM/SW Contact:    Alaiyah Bollman C Tarpley-Carter, LCSWA Phone Number: 02/18/2021, 8:39 AM   Clinical Narrative: Pt participated in Cage-Aid.  Pt stated he does not use substance or ETOH.  Pt does smoke.  Pt was offered resources, due to smoking.  CSW will provide resources to pt for possible future use.  Yuleimy Kretz Tarpley-Carter, MSW, LCSW-A Pronouns:  She/Her/Hers [facility] Clinical Social Worker Direct Number:  [phone] [email or web address]     CAGE-AID Screening:    Have You Ever Felt You Ought to Cut Down on Your Drinking or Drug Use?: No Have People Annoyed You By [employer] Your Drinking Or Drug Use?: No Have You Felt Bad Or Guilty About Your Drinking Or Drug Use?: No Have You Ever Had a Drink or Used Drugs First Thing In The Morning to Steady Your Nerves or to Get Rid of a Hangover?: No CAGE-AID Score: 0  Substance Abuse Education Offered: Yes  Substance abuse interventions: Transport planner

## 2021-02-18 NOTE — Progress Notes (Signed)
Patient arrived to the unit via stretcher from the ED at 1830. Blood pressure at 1832 was 204/109. Rechecked at 1851, BP was 198/106. Notified MD on file to order BP meds with parameters. Will continue to support and monitor patient.

## 2021-02-18 NOTE — ED Provider Notes (Signed)
[facility] PROGRESSIVE CARE Provider Note   CSN: [id number] Arrival date & time: 02/16/21  2004     History Chief Complaint  Patient presents with   Code Stroke    NASR DOOMS is a 60 y.o. male.  TOA MARCELIN is a 60 y.o. male with hx pf previous stroke, HTN, DM, arthritis, who presents via EMS as a CODE Stroke. Pt presents with left sided weakness. Last seen normal when he went to bed at 8 pm last night. Pt reports when he woke up this morning around 7 am his left side was weak causing him to fall while getting out of bed. Pt reports because of the weakness he could not get up and was on the floor all day until family came to check on him and found him on the floor around 7 pm. Pt reports he has not been able to move his left leg at all and that his right arm feels a bit weak. He denies headache, visual changes, slurred speech or facial droop. Denies dizziness. No chest pain, shortness of breath, vomiting, abdominal pain, recent fevers or illness.  The history is provided by the patient, the EMS personnel and medical records.      Past Medical History:  Diagnosis Date   Allergy    Arthritis    Diabetes mellitus without complication ([place])    Hypertension    Stroke [facility])     Patient Active Problem List   Diagnosis Date Noted   Stroke ([place]) 02/17/2021   Essential hypertension 02/17/2021   Diabetes mellitus type 2 in obese ([place]) 02/17/2021   Elevated troponin 02/17/2021   Prolonged QT interval 02/17/2021    Past Surgical History:  Procedure Laterality Date   FRACTURE SURGERY Left    Patient fractured left arm       Family History  Problem Relation Age of Onset   Colon cancer Maternal Grandmother    Esophageal cancer Neg Hx    Stomach cancer Neg Hx    Rectal cancer Neg Hx     Social History   Tobacco Use   Smoking status: Some Days    Types: Cigarettes   Smokeless tobacco: Never  Vaping Use   Vaping Use: Never used  Substance Use Topics    Alcohol use: Yes    Comment: occasionally   Drug use: Yes    Types: Marijuana    Home Medications Prior to Admission medications   Medication Sig Start Date End Date Taking? Authorizing Provider  amLODipine (NORVASC) 10 MG tablet Take 1 tablet (10 mg total) by mouth daily. 05/28/20  Yes Kerin Perna, NP  atorvastatin (LIPITOR) 10 MG tablet Take 1 tablet (10 mg total) by mouth daily. 05/28/20  Yes Kerin Perna, NP  ergocalciferol (VITAMIN D2) 1.25 MG (50000 UT) capsule Take 50,000 Units by mouth every Wednesday. 01/03/21  Yes [provider]  gabapentin (NEURONTIN) 300 MG capsule TAKE 1 CAPSULE BY MOUTH THREE TIMES A DAY Patient taking differently: Take 300 mg by mouth 3 (three) times daily. 12/16/20  Yes Kerin Perna, NP  glimepiride (AMARYL) 4 MG tablet Take 1 tablet (4 mg total) by mouth every morning. 10/08/20  Yes Kerin Perna, NP  hydrochlorothiazide (HYDRODIURIL) 25 MG tablet Take 1 tablet (25 mg total) by mouth daily. 06/28/20  Yes Kerin Perna, NP  losartan (COZAAR) 25 MG tablet Take 1 tablet (25 mg total) by mouth daily. 05/28/20  Yes Kerin Perna, NP  metFORMIN (  GLUCOPHAGE) 1000 MG tablet Take 1 tablet (1,000 mg total) by mouth 2 (two) times daily with a meal. 05/28/20  Yes Kerin Perna, NP  metoprolol succinate (TOPROL-XL) 25 MG 24 hr tablet Take 25 mg by mouth 3 (three) times daily. 12/30/20  Yes [provider]  Oxcarbazepine (TRILEPTAL) 300 MG tablet Take 1 tablet (300 mg total) by mouth 3 (three) times daily. 02/04/20  Yes Kerin Perna, NP  sitaGLIPtin (JANUVIA) 50 MG tablet Take 1 tablet (50 mg total) by mouth daily. 10/08/20  Yes Kerin Perna, NP    Allergies    Patient has no known allergies.  Review of Systems   Review of Systems  Constitutional:  Negative for chills and fever.  HENT: Negative.    Eyes:  Negative for visual disturbance.  Respiratory:  Negative for cough and shortness of breath.    Gastrointestinal:  Negative for abdominal distention.  Musculoskeletal:  Positive for arthralgias. Negative for back pain and neck pain.  Skin:  Negative for wound.  Neurological:  Positive for weakness. Negative for dizziness, syncope, facial asymmetry, speech difficulty, light-headedness, numbness and headaches.  All other systems reviewed and are negative.  Physical Exam Updated Vital Signs BP (!) 195/109 (BP Location: Right Arm)   Pulse (!) 112   Temp (!) 97.5 F (36.4 C) (Oral)   Resp 18   Ht 6\' 1"  (1.854 m)   Wt 124.7 kg   SpO2 93%   BMI 36.28 kg/m   Physical Exam Vitals and nursing note reviewed.  Constitutional:      General: He is not in acute distress.    Appearance: Normal appearance. He is well-developed. He is obese. He is not ill-appearing or diaphoretic.  HENT:     Head: Normocephalic and atraumatic.  Eyes:     General:        Right eye: No discharge.        Left eye: No discharge.     Extraocular Movements: Extraocular movements intact.     Pupils: Pupils are equal, round, and reactive to light.  Cardiovascular:     Rate and Rhythm: Regular rhythm. Tachycardia present.     Pulses: Normal pulses.     Heart sounds: Normal heart sounds.  Pulmonary:     Effort: Pulmonary effort is normal. No respiratory distress.     Breath sounds: Normal breath sounds. No wheezing or rales.     Comments: Respirations equal and unlabored, patient able to speak in full sentences, lungs clear to auscultation bilaterally  Abdominal:     General: Bowel sounds are normal. There is no distension.     Palpations: Abdomen is soft. There is no mass.     Tenderness: There is no abdominal tenderness. There is no guarding.     Comments: Abdomen soft, nondistended, nontender to palpation in all quadrants without guarding or peritoneal signs  Musculoskeletal:        General: Tenderness present. No deformity.     Cervical back: Neck supple. No tenderness.     Comments: Mild tenderness  over the right shoulder without deformity, pt also with tenderness over bilateral knees with mild abrasions from fall, no swelling or deformity, full ROM of right knee  Skin:    General: Skin is warm and dry.     Capillary Refill: Capillary refill takes less than 2 seconds.  Neurological:     Mental Status: He is alert and oriented to person, place, and time.     Coordination: Coordination  normal.     Comments: Speech is clear, able to follow commands CN III-XII intact Right upper and lower extremity with 5/5 strength LUE 4+/5, LLE 0/5 strength Sensation normal to light and sharp touch Moves extremities without ataxia, coordination intact  Psychiatric:        Mood and Affect: Mood normal.        Behavior: Behavior normal.    ED Results / Procedures / Treatments   Labs (all labs ordered are listed, but only abnormal results are displayed) Labs Reviewed  CBC - Abnormal; Notable for the following components:      Result Value   WBC 12.4 (*)    RDW 16.1 (*)    All other components within normal limits  DIFFERENTIAL - Abnormal; Notable for the following components:   Neutro Abs 8.4 (*)    Monocytes Absolute 1.5 (*)    All other components within normal limits  COMPREHENSIVE METABOLIC PANEL - Abnormal; Notable for the following components:   Potassium 3.1 (*)    Glucose, Bld 181 (*)    BUN 7 (*)    Total Bilirubin 1.5 (*)    All other components within normal limits  CK - Abnormal; Notable for the following components:   Total CK 574 (*)    All other components within normal limits  URINALYSIS, ROUTINE W REFLEX MICROSCOPIC - Abnormal; Notable for the following components:   Color, Urine AMBER (*)    APPearance HAZY (*)    Specific Gravity, Urine >1.046 (*)    Glucose, UA 50 (*)    Ketones, ur 20 (*)    Protein, ur 100 (*)    Bacteria, UA RARE (*)    All other components within normal limits  HEMOGLOBIN A1C - Abnormal; Notable for the following components:   Hgb A1c MFr Bld  9.9 (*)    All other components within normal limits  LIPID PANEL - Abnormal; Notable for the following components:   HDL 26 (*)    All other components within normal limits  RAPID URINE DRUG SCREEN, HOSP PERFORMED - Abnormal; Notable for the following components:   Tetrahydrocannabinol POSITIVE (*)    All other components within normal limits  BASIC METABOLIC PANEL - Abnormal; Notable for the following components:   Potassium 2.9 (*)    Glucose, Bld 124 (*)    BUN 7 (*)    Calcium 8.8 (*)    All other components within normal limits  CBC WITH DIFFERENTIAL/PLATELET - Abnormal; Notable for the following components:   RDW 15.9 (*)    Monocytes Absolute 1.1 (*)    All other components within normal limits  GLUCOSE, CAPILLARY - Abnormal; Notable for the following components:   Glucose-Capillary 139 (*)    All other components within normal limits  GLUCOSE, CAPILLARY - Abnormal; Notable for the following components:   Glucose-Capillary 137 (*)    All other components within normal limits  I-STAT CHEM 8, ED - Abnormal; Notable for the following components:   Potassium 3.1 (*)    BUN 7 (*)    Glucose, Bld 187 (*)    Calcium, Ion 1.06 (*)    TCO2 34 (*)    Hemoglobin 19.4 (*)    HCT 57.0 (*)    All other components within normal limits  CBG MONITORING, ED - Abnormal; Notable for the following components:   Glucose-Capillary 188 (*)    All other components within normal limits  CBG MONITORING, ED - Abnormal; Notable for the following  components:   Glucose-Capillary 183 (*)    All other components within normal limits  CBG MONITORING, ED - Abnormal; Notable for the following components:   Glucose-Capillary 157 (*)    All other components within normal limits  TROPONIN I (HIGH SENSITIVITY) - Abnormal; Notable for the following components:   Troponin I (High Sensitivity) 37 (*)    All other components within normal limits  TROPONIN I (HIGH SENSITIVITY) - Abnormal; Notable for the  following components:   Troponin I (High Sensitivity) 55 (*)    All other components within normal limits  TROPONIN I (HIGH SENSITIVITY) - Abnormal; Notable for the following components:   Troponin I (High Sensitivity) 46 (*)    All other components within normal limits  TROPONIN I (HIGH SENSITIVITY) - Abnormal; Notable for the following components:   Troponin I (High Sensitivity) 41 (*)    All other components within normal limits  RESP PANEL BY RT-PCR (FLU A&B, COVID) ARPGX2  PROTIME-INR  APTT  MAGNESIUM  HIV ANTIBODY (ROUTINE TESTING W REFLEX)  PROTIME-INR    EKG EKG Interpretation  Date/Time:  Sunday February 16 2021 20:43:58 EST Ventricular Rate:  110 PR Interval:  96 QRS Duration: 108 QT Interval:  428 QTC Calculation: 580 R Axis:   263 Text Interpretation: Sinus or ectopic atrial tachycardia Atrial premature complex Probable right ventricular hypertrophy ST elevations inferior Prolonged QT interval No significant change was found Confirmed by Aletta Edouard [phone]) on 02/16/2021 8:50:40 PM  Radiology CT ANGIO HEAD W OR WO CONTRAST  Result Date: 02/16/2021 CLINICAL DATA:  Left-sided weakness EXAM: CT ANGIOGRAPHY HEAD AND NECK TECHNIQUE: Multidetector CT imaging of the head and neck was performed using the standard protocol during bolus administration of intravenous contrast. Multiplanar CT image reconstructions and MIPs were obtained to evaluate the vascular anatomy. Carotid stenosis measurements (when applicable) are obtained utilizing NASCET criteria, using the distal internal carotid diameter as the denominator. CONTRAST:  81mL OMNIPAQUE IOHEXOL 350 MG/ML SOLN COMPARISON:  02/28/2020, correlation is made with same day CT head FINDINGS: CT HEAD FINDINGS For noncontrast findings, please see same day CT head. CTA NECK FINDINGS Aortic arch: Two-vessel arch with a common origin of the brachiocephalic and left common carotid arteries. Imaged portion shows no evidence of aneurysm  or dissection. No significant stenosis of the major arch vessel origins. Moderate calcified and noncalcified plaque. Right carotid system: No evidence of dissection, stenosis (50% or greater) or occlusion. Mild narrowing at the bifurcation, secondary to calcified and noncalcified plaque, which is not hemodynamically significant. Left carotid system: 65-70% stenosis of the proximal left ICA, secondary to calcified and noncalcified plaque. No evidence of dissection or occlusion. Vertebral arteries: Severe stenosis at the origin of the right vertebral artery (series 8, images 295-300), which appears worse than on the prior exam. Minimal flow in the right V2 and V3 segments. The extracranial left vertebral artery is without significant stenosis. Skeleton: No acute osseous abnormality. Degenerative changes in the cervical spine. Other neck: Negative. Upper chest: Mild ground-glass opacities, which are nonspecific may represent pulmonary edema. No pleural effusion. Unchanged prominent mediastinal lymph nodes. Review of the MIP images confirms the above findings CTA HEAD FINDINGS Anterior circulation: Redemonstrated moderate to severe calcified plaque in the carotid siphons, right greater than left, overall unchanged. A1 segments patent. Normal anterior communicating artery. Anterior cerebral arteries are patent to their distal aspects. No M1 stenosis or occlusion. Normal MCA bifurcations. Distal MCA branches perfused and symmetric. Posterior circulation: Focal plaque in the left V4,  which is otherwise patent. The diminutive right V4 is poorly opacified intracranially, with near complete occlusion just proximal to the take-off of the right PICA and only intermittent faint flow to the vertebrobasilar junction, some of which may be retrograde. The right PICA is not opacified. The left PICA is patent. Basilar patent to its distal aspect. Superior cerebellar arteries patent bilaterally. PCAs perfused to their distal aspects  without stenosis. The bilateral posterior communicating arteries are not visualized Venous sinuses: As permitted by contrast timing, patent. Anatomic variants: None significant Review of the MIP images confirms the above findings IMPRESSION: 1. Poor opacification of the right vertebral artery throughout its course, with increased focal stenosis just distal to its take-off. The right V4 is likely occluded proximal to the takeoff of the right PICA, with no definitive flow seen in the right PICA, which is new from prior exam. 2. Redemonstrated moderate to severe calcified plaque in the carotid siphons, right greater than left, overall unchanged. 3. 65-70% stenosis of the proximal left ICA, secondary to calcified and noncalcified plaque. Electronically Signed   By: Merilyn Baba M.D.   On: 02/16/2021 21:08   DG Chest 1 View  Result Date: 02/16/2021 CLINICAL DATA:  Status post fall with left-sided weakness. EXAM: CHEST  1 VIEW COMPARISON:  December 26, 2020 FINDINGS: Mild to moderate severity, predominant stable areas of scarring and/or atelectasis are noted within the bilateral lung bases there is no evidence of a pleural effusion or pneumothorax. The cardiac silhouette is mildly enlarged and unchanged in size. The visualized skeletal structures are unremarkable. IMPRESSION: Stable cardiomegaly with mild to moderate severity, predominant stable areas of bibasilar scarring and/or atelectasis. Electronically Signed   By: Virgina Norfolk M.D.   On: 02/16/2021 23:51   DG Shoulder Right  Result Date: 02/16/2021 CLINICAL DATA:  Fall. EXAM: RIGHT SHOULDER - 2+ VIEW COMPARISON:  Right shoulder x-ray 05/02/2020 FINDINGS: Healed greater tuberosity fracture is present. No definite acute fracture or dislocation. There are stable mild degenerative changes of the acromioclavicular joint. There are vascular calcifications in the soft tissues. IMPRESSION: 1. No acute bony abnormality. Electronically Signed   By: Ronney Asters  M.D.   On: 02/16/2021 23:38   CT ANGIO NECK W OR WO CONTRAST  Result Date: 02/16/2021 CLINICAL DATA:  Left-sided weakness EXAM: CT ANGIOGRAPHY HEAD AND NECK TECHNIQUE: Multidetector CT imaging of the head and neck was performed using the standard protocol during bolus administration of intravenous contrast. Multiplanar CT image reconstructions and MIPs were obtained to evaluate the vascular anatomy. Carotid stenosis measurements (when applicable) are obtained utilizing NASCET criteria, using the distal internal carotid diameter as the denominator. CONTRAST:  56mL OMNIPAQUE IOHEXOL 350 MG/ML SOLN COMPARISON:  02/28/2020, correlation is made with same day CT head FINDINGS: CT HEAD FINDINGS For noncontrast findings, please see same day CT head. CTA NECK FINDINGS Aortic arch: Two-vessel arch with a common origin of the brachiocephalic and left common carotid arteries. Imaged portion shows no evidence of aneurysm or dissection. No significant stenosis of the major arch vessel origins. Moderate calcified and noncalcified plaque. Right carotid system: No evidence of dissection, stenosis (50% or greater) or occlusion. Mild narrowing at the bifurcation, secondary to calcified and noncalcified plaque, which is not hemodynamically significant. Left carotid system: 65-70% stenosis of the proximal left ICA, secondary to calcified and noncalcified plaque. No evidence of dissection or occlusion. Vertebral arteries: Severe stenosis at the origin of the right vertebral artery (series 8, images 295-300), which appears worse than on the  prior exam. Minimal flow in the right V2 and V3 segments. The extracranial left vertebral artery is without significant stenosis. Skeleton: No acute osseous abnormality. Degenerative changes in the cervical spine. Other neck: Negative. Upper chest: Mild ground-glass opacities, which are nonspecific may represent pulmonary edema. No pleural effusion. Unchanged prominent mediastinal lymph nodes.  Review of the MIP images confirms the above findings CTA HEAD FINDINGS Anterior circulation: Redemonstrated moderate to severe calcified plaque in the carotid siphons, right greater than left, overall unchanged. A1 segments patent. Normal anterior communicating artery. Anterior cerebral arteries are patent to their distal aspects. No M1 stenosis or occlusion. Normal MCA bifurcations. Distal MCA branches perfused and symmetric. Posterior circulation: Focal plaque in the left V4, which is otherwise patent. The diminutive right V4 is poorly opacified intracranially, with near complete occlusion just proximal to the take-off of the right PICA and only intermittent faint flow to the vertebrobasilar junction, some of which may be retrograde. The right PICA is not opacified. The left PICA is patent. Basilar patent to its distal aspect. Superior cerebellar arteries patent bilaterally. PCAs perfused to their distal aspects without stenosis. The bilateral posterior communicating arteries are not visualized Venous sinuses: As permitted by contrast timing, patent. Anatomic variants: None significant Review of the MIP images confirms the above findings IMPRESSION: 1. Poor opacification of the right vertebral artery throughout its course, with increased focal stenosis just distal to its take-off. The right V4 is likely occluded proximal to the takeoff of the right PICA, with no definitive flow seen in the right PICA, which is new from prior exam. 2. Redemonstrated moderate to severe calcified plaque in the carotid siphons, right greater than left, overall unchanged. 3. 65-70% stenosis of the proximal left ICA, secondary to calcified and noncalcified plaque. Electronically Signed   By: Merilyn Baba M.D.   On: 02/16/2021 21:08    DG Knee Complete 4 Views Left  Result Date: 02/16/2021 CLINICAL DATA:  Status post fall. EXAM: LEFT KNEE - COMPLETE 4+ VIEW COMPARISON:  None. FINDINGS: No evidence of an acute fracture or  dislocation. No evidence of arthropathy or other focal bone abnormality. Moderate to marked severity vascular calcification is seen. A trace joint effusion is seen. IMPRESSION: 1. No acute osseous abnormality. 2. Trace joint effusion. Electronically Signed   By: Virgina Norfolk M.D.   On: 02/16/2021 23:52   DG Knee Complete 4 Views Right  Result Date: 02/16/2021 CLINICAL DATA:  Status post fall. EXAM: RIGHT KNEE - COMPLETE 4+ VIEW COMPARISON:  None. FINDINGS: No evidence of an acute fracture or dislocation. No evidence of arthropathy or other focal bone abnormality. Moderate to marked severity vascular calcification is seen. A very small joint effusion is seen. IMPRESSION: 1. No acute fracture or dislocation. 2. Very small joint effusion. Electronically Signed   By: Virgina Norfolk M.D.   On: 02/16/2021 23:47   CT HEAD CODE STROKE WO CONTRAST  Result Date: 02/16/2021 CLINICAL DATA:  Code stroke.  Left-sided weakness EXAM: CT HEAD WITHOUT CONTRAST TECHNIQUE: Contiguous axial images were obtained from the base of the skull through the vertex without intravenous contrast. COMPARISON:  12/26/2020 FINDINGS: Brain: No acute cortical infarction. Periventricular white matter changes, likely the sequela of chronic small vessel ischemic disease, with redemonstrated small vessel infarcts in the bilateral periventricular white matter, basal ganglia, left pons, and right cerebellar hemisphere. No acute hemorrhage, mass mass effect, or midline shift. No extra-axial collection or hydrocephalus. Vascular: No hyperdense vessel or unexpected calcification. Skull: Normal. Negative for fracture  or focal lesion. Sinuses/Orbits: No acute finding. Other: The mastoid air cells are well aerated. ASPECTS [facility] Stroke Program Early CT Score) - Ganglionic level infarction (caudate, lentiform nuclei, internal capsule, insula, M1-M3 cortex): 7 - Supraganglionic infarction (M4-M6 cortex): 3 Total score (0-10 with 10 being  normal): 10 IMPRESSION: 1. No acute intracranial process. 2. ASPECTS is 10 Code stroke imaging results were communicated on 02/16/2021 at 8:27 pm to provider Dr. Lorrin Goodell via secure text paging. Electronically Signed   By: Merilyn Baba M.D.   On: 02/16/2021 20:29    Procedures .Critical Care Performed by: Jacqlyn Larsen, PA-C Authorized by: Jacqlyn Larsen, PA-C   Critical care provider statement:    Critical care time (minutes):  40   Critical care was necessary to treat or prevent imminent or life-threatening deterioration of the following conditions:  CNS failure or compromise   Critical care was time spent personally by me on the following activities:  Development of treatment plan with patient or surrogate, discussions with consultants, evaluation of patient's response to treatment, examination of patient, ordering and review of laboratory studies, ordering and review of radiographic studies, ordering and performing treatments and interventions, pulse oximetry, re-evaluation of patient's condition and review of old charts   Medications Ordered in ED Medications  acetaminophen (TYLENOL) tablet 1,000 mg (1,000 mg Oral Not Given 02/17/21 0028)  aspirin EC tablet 325 mg (325 mg Oral Given 02/18/21 0927)    Or  aspirin suppository 300 mg ( Rectal See Alternative 02/18/21 [id number])    ED Course  I have reviewed the triage vital signs and the nursing notes.  Pertinent labs & imaging results that were available during my care of the patient were reviewed by me and considered in my medical decision making (see chart for details).   MDM Rules/Calculators/A&P                           60 y.o. male presents to the ED via EMS as a CODE Stroke, this involves an extensive number of treatment options, and is a complaint that carries with it a high risk of complications and morbidity. Pt fell out of bed when he woke up this morning due to weakness, was on the floor all day until family arrived and  called EMS.  LKW: 2000 on 1//26/22  On arrival pt is nontoxic, vitals significant for hypertension and tachycardia. Exam significant for 0/5 left lower extremity weakness, 4+/5 LUE weakness  Additional history obtained from EMS. Previous records obtained and reviewed via EMR  Pt evaluated immediately upon arrival along side neurology  EKG: tachycardia with non specific ST changes, QT prolongation  Lab Tests:  I Ordered, reviewed, and interpreted labs, which included:  CBC: no leukocytosis, normal Hgb CMP: mild hypokalemia, no other significant electrolyte derangement, normal renal function, LFTs unremarkable Trop: Mildly elevated at 37, pt without CP, likely due to tachycardia CK: 574 Coags: WNL  Imaging Studies ordered:  I ordered imaging studies which included CT head, CTA head & neck, CXR, and XR of bilat knees and R shoulder, I independently visualized and interpreted imaging which showed:  CT Head/CTA: No acute intracranial abnormality no iniital CT, CTA with poor opacification of Right Vert and no definitive flow in the right PICA  CXR: Stable cardiomegaly with mild to moderate severity, predominant stable areas of bibasilar scarring and/or atelectasis  XRs of Knees and shoulder w/o acute bony abnormality  ED Course:   Neuro reports  pt is outside of window for lytics, no LVO eligible for IR intervention, recommends medicine admission for stroke workup and MRI.   Pt is currently declining MRI due to hx of claustrophobia despite offering medications to help with this, pt would like to think more about MRI and may be will ing to complete this study tomorrow  The remainder of patients workup has been overall reassuring, x-rays do not show traumatic injuries from fall. Mildly elevated CK despite being on the floor all day. Tachycardia improving with fluids  Case discussed with Dr. Tonie Griffith with [facility] who will see and admit the patient  Portions of this note were  generated with Dragon dictation software. Dictation errors may occur despite best attempts at proofreading.  Final Clinical Impression(s) / ED Diagnoses Final diagnoses:  Acute ischemic stroke [facility])  Left leg weakness    Rx / DC Orders ED Discharge Orders     None        Janet Berlin 02/18/21 1241    Hayden Rasmussen, MD 02/19/21 1324

## 2021-02-18 NOTE — Progress Notes (Signed)
PROGRESS NOTE  Brent Young  ZOX:[id number] DOB: Apr 18, 1960 DOA: 02/16/2021 PCP: Brent Sessions, NP   Brief Narrative: Brent Young is a 60 y.o. male with a history of lacunar CVAs, arthritis, T2DM, HTN, and obesity who presented to the ED 11/27 as a code stroke with left arm and leg weakness upon waking, LKW 10pm previous night, having fallen out of bed and been unable to get up all day. CT and CTA head/neck showed right vertebral artery stenosis with new right PICA occlusion as well as redemonstrated unchanged mod-severe calcified plaque in R > L carotid siphons. MRI demonstrated acute-subacute infarcts at right basal ganglia and corona radiata on background of extensive chronic small vessel ischemia. Neurology and neurointerventional radiology were consulted with plans for cerebral angiogram which has been delayed by the patient requesting to eat. Currently planned for 11/30. Inpatient rehab is the recommended disposition per therapy.   Assessment & Plan: Principal Problem:   Stroke [facility]) Active Problems:   Essential hypertension   Diabetes mellitus type 2 in obese (HCC)   Elevated troponin   Prolonged QT interval  Recurrent acute CVA, right vertebral artery stenosis on background extensive small vessel ischemic cerebrovascular disease, history of L basal ganglia and hippocampal ICH: Right basal ganglia, corona radiata, with left hemiparesis, dysphagia.  - Started aspirin 325 mg daily. - Stroke neurology following. Neurointerventional radiology consulted for cerebral angiogram. - Augmenting statin atorvastatin  > . LDL 98.  - Echo without cardioembolic source, continue cardiac monitoring.  - Remains with significant deficits. SLP, OT, OT recommending CIR.   Right shoulder pain: Consistent with mild AC joint arthritis possibly from falling and overuse. XR without fracture or dislocation.  - Tylenol and toradol prn.  - Consider further work up if not improving as  expected.    HTN:  - Allow permissive HTN for now (220/120) given stenosis. Restart BP medications timing per neurology, including losartan, HCTZ, metoprolol, amlodipine.    Tobacco use:  - Cessation counseling provided   Demand myocardial ischemia: No chest pain. Troponin very mildly elevated. No ischemic ECG changes (computer interpretation of ST elevations in inferior leads is incorrect) - Outpatient risk stratification planned unless anginal symptoms develop.    Hypokalemia:  - Supplement again, will repeat po supplementation in addition to the 6 runs ordered previously while NPO.    Leukocytosis: Suspect reactive to CVA. Resolved without abx.    T2DM: HbA1c 9.9% - SSI. Continue home linagliptin   Obesity: Body mass index is 36.28 kg/m.   DVT prophylaxis: Lovenox Code Status: Full Family Communication: None at bedside Disposition Plan:  Status is: Inpatient  Remains inpatient appropriate because: Requires angiogram/further work up and a safe discharge plan.  Consultants:  Stroke neurology Neurointerventional radiology.  Procedures:  Cerebral angiogram is planned  Antimicrobials: None   Subjective: No majro changes to severe LLE weakness and moderate LUE weakness. No complaints of right shoulder pain today. Opted to eat breakfast in lieu of waiting til the afternoon for cerebral angiogram.   Objective: Vitals:   02/18/21 1009 02/18/21 1224 02/18/21 1226 02/18/21 1300  BP: (!) 173/93 (!) 158/87 (!) 156/86   Pulse:  (!) 110 (!) 111   Resp:  20 20   Temp:  98.5 F (36.9 C) 98.1 F (36.7 C)   TempSrc:  Oral Oral   SpO2:  92% (!) 89% 93%  Weight:      Height:        Intake/Output Summary (Last 24 hours) at 02/18/2021  1454 Last data filed at 02/18/2021 0931 Gross per 24 hour  Intake 1503.38 ml  Output 350 ml  Net 1153.38 ml   Filed Weights   02/16/21 2106  Weight: 124.7 kg    Gen: 60 y.o. male in no distress  Pulm: Non-labored breathing room air.  Clear to auscultation bilaterally.  CV: Regular rate and rhythm. No murmur, rub, or gallop. No JVD, no pitting pedal edema. GI: Abdomen soft, non-tender, non-distended, with normoactive bowel sounds. No organomegaly or masses felt. Ext: Warm, no deformities Skin: No rashes, lesions or ulcers on visualized skin.  Neuro: Alert and oriented. Left hemiparesis LE > UE stable. Psych: Judgement and insight appear normal. Mood & affect appropriate.   Data Reviewed: I have personally reviewed following labs and imaging studies  CBC: Recent Labs  Lab 02/16/21 2013 02/16/21 2100 02/18/21 0353  WBC  --  12.4* 9.0  NEUTROABS  --  8.4* 5.0  HGB 19.4* 16.8 15.6  HCT 57.0* 49.8 47.7  MCV  --  91.5 91.7  PLT  --  341 334   Basic Metabolic Panel: Recent Labs  Lab 02/16/21 2013 02/16/21 2100 02/18/21 0353  NA 141 137 141  K 3.1* 3.1* 2.9*  CL 98 98 103  CO2  --  27 28  GLUCOSE 187* 181* 124*  BUN 7* 7* 7*  CREATININE 0.70 0.88 0.71  CALCIUM  --  9.2 8.8*  MG  --  1.8  --    GFR: Estimated Creatinine Clearance: 134.1 mL/min (by C-G formula based on SCr of 0.71 mg/dL). Liver Function Tests: Recent Labs  Lab 02/16/21 2100  AST 25  ALT 17  ALKPHOS 99  BILITOT 1.5*  PROT 7.8  ALBUMIN 3.8   No results for input(s): LIPASE, AMYLASE in the last 168 hours. No results for input(s): AMMONIA in the last 168 hours. Coagulation Profile: Recent Labs  Lab 02/16/21 2100 02/18/21 0353  INR 1.2 1.2   Cardiac Enzymes: Recent Labs  Lab 02/16/21 2100  CKTOTAL 574*   BNP (last 3 results) No results for input(s): PROBNP in the last 8760 hours. HbA1C: Recent Labs    02/17/21 0327  HGBA1C 9.9*   CBG: Recent Labs  Lab 02/17/21 0655 02/17/21 1147 02/18/21 0230 02/18/21 0552 02/18/21 1231  GLUCAP 183* 157* 139* 137* 182*   Lipid Profile: Recent Labs    02/17/21 0328  CHOL 148  HDL 26*  LDLCALC 98  TRIG 161  CHOLHDL 5.7   Thyroid Function Tests: No results for input(s):  TSH, T4TOTAL, FREET4, T3FREE, THYROIDAB in the last 72 hours. Anemia Panel: No results for input(s): VITAMINB12, FOLATE, FERRITIN, TIBC, IRON, RETICCTPCT in the last 72 hours. Urine analysis:    Component Value Date/Time   COLORURINE AMBER (A) 02/17/2021 0742   APPEARANCEUR HAZY (A) 02/17/2021 0742   LABSPEC >1.046 (H) 02/17/2021 0742   PHURINE 5.0 02/17/2021 0742   GLUCOSEU 50 (A) 02/17/2021 0742   HGBUR NEGATIVE 02/17/2021 0742   BILIRUBINUR NEGATIVE 02/17/2021 0742   BILIRUBINUR negative 01/18/2020 1529   KETONESUR 20 (A) 02/17/2021 0742   PROTEINUR 100 (A) 02/17/2021 0742   UROBILINOGEN 0.2 01/18/2020 1529   NITRITE NEGATIVE 02/17/2021 0742   LEUKOCYTESUR NEGATIVE 02/17/2021 0742   Recent Results (from the past 240 hour(s))  Resp Panel by RT-PCR (Flu A&B, Covid) Nasopharyngeal Swab     Status: None   Collection Time: 02/16/21  8:08 PM   Specimen: Nasopharyngeal Swab; Nasopharyngeal(NP) swabs in vial transport medium  Result Value Ref  Range Status   SARS Coronavirus 2 by RT PCR NEGATIVE NEGATIVE Final    Comment: (NOTE) SARS-CoV-2 target nucleic acids are NOT DETECTED.  The SARS-CoV-2 RNA is generally detectable in upper respiratory specimens during the acute phase of infection. The lowest concentration of SARS-CoV-2 viral copies this assay can detect is 138 copies/mL. A negative result does not preclude SARS-Cov-2 infection and should not be used as the sole basis for treatment or other patient management decisions. A negative result may occur with  improper specimen collection/handling, submission of specimen other than nasopharyngeal swab, presence of viral mutation(s) within the areas targeted by this assay, and inadequate number of viral copies(<138 copies/mL). A negative result must be combined with clinical observations, patient history, and epidemiological information. The expected result is Negative.  Fact Sheet for Patients:   BloggerCourse.com  Fact Sheet for Healthcare Providers:  SeriousBroker.it  This test is no t yet approved or cleared by the [place] FDA and  has been authorized for detection and/or diagnosis of SARS-CoV-2 by FDA under an Emergency Use Authorization (EUA). This EUA will remain  in effect (meaning this test can be used) for the duration of the COVID-19 declaration under Section 564(b)(1) of the Act, 21 U.S.C.section 360bbb-3(b)(1), unless the authorization is terminated  or revoked sooner.       Influenza A by PCR NEGATIVE NEGATIVE Final   Influenza B by PCR NEGATIVE NEGATIVE Final    Comment: (NOTE) The Xpert Xpress SARS-CoV-2/FLU/RSV plus assay is intended as an aid in the diagnosis of influenza from Nasopharyngeal swab specimens and should not be used as a sole basis for treatment. Nasal washings and aspirates are unacceptable for Xpert Xpress SARS-CoV-2/FLU/RSV testing.  Fact Sheet for Patients: BloggerCourse.com  Fact Sheet for Healthcare Providers: SeriousBroker.it  This test is not yet approved or cleared by the [place] FDA and has been authorized for detection and/or diagnosis of SARS-CoV-2 by FDA under an Emergency Use Authorization (EUA). This EUA will remain in effect (meaning this test can be used) for the duration of the COVID-19 declaration under Section 564(b)(1) of the Act, 21 U.S.C. section 360bbb-3(b)(1), unless the authorization is terminated or revoked.  Performed at [facility], [address], [place], [place] [address]       Radiology Studies: CT ANGIO HEAD W OR WO CONTRAST  Result Date: 02/16/2021 CLINICAL DATA:  Left-sided weakness EXAM: CT ANGIOGRAPHY HEAD AND NECK TECHNIQUE: Multidetector CT imaging of the head and neck was performed using the standard protocol during bolus administration of intravenous contrast. Multiplanar CT  image reconstructions and MIPs were obtained to evaluate the vascular anatomy. Carotid stenosis measurements (when applicable) are obtained utilizing NASCET criteria, using the distal internal carotid diameter as the denominator. CONTRAST:  53mL OMNIPAQUE IOHEXOL 350 MG/ML SOLN COMPARISON:  02/28/2020, correlation is made with same day CT head FINDINGS: CT HEAD FINDINGS For noncontrast findings, please see same day CT head. CTA NECK FINDINGS Aortic arch: Two-vessel arch with a common origin of the brachiocephalic and left common carotid arteries. Imaged portion shows no evidence of aneurysm or dissection. No significant stenosis of the major arch vessel origins. Moderate calcified and noncalcified plaque. Right carotid system: No evidence of dissection, stenosis (50% or greater) or occlusion. Mild narrowing at the bifurcation, secondary to calcified and noncalcified plaque, which is not hemodynamically significant. Left carotid system: 65-70% stenosis of the proximal left ICA, secondary to calcified and noncalcified plaque. No evidence of dissection or occlusion. Vertebral arteries: Severe stenosis at  the origin of the right vertebral artery (series 8, images 295-300), which appears worse than on the prior exam. Minimal flow in the right V2 and V3 segments. The extracranial left vertebral artery is without significant stenosis. Skeleton: No acute osseous abnormality. Degenerative changes in the cervical spine. Other neck: Negative. Upper chest: Mild ground-glass opacities, which are nonspecific may represent pulmonary edema. No pleural effusion. Unchanged prominent mediastinal lymph nodes. Review of the MIP images confirms the above findings CTA HEAD FINDINGS Anterior circulation: Redemonstrated moderate to severe calcified plaque in the carotid siphons, right greater than left, overall unchanged. A1 segments patent. Normal anterior communicating artery. Anterior cerebral arteries are patent to their distal aspects.  No M1 stenosis or occlusion. Normal MCA bifurcations. Distal MCA branches perfused and symmetric. Posterior circulation: Focal plaque in the left V4, which is otherwise patent. The diminutive right V4 is poorly opacified intracranially, with near complete occlusion just proximal to the take-off of the right PICA and only intermittent faint flow to the vertebrobasilar junction, some of which may be retrograde. The right PICA is not opacified. The left PICA is patent. Basilar patent to its distal aspect. Superior cerebellar arteries patent bilaterally. PCAs perfused to their distal aspects without stenosis. The bilateral posterior communicating arteries are not visualized Venous sinuses: As permitted by contrast timing, patent. Anatomic variants: None significant Review of the MIP images confirms the above findings IMPRESSION: 1. Poor opacification of the right vertebral artery throughout its course, with increased focal stenosis just distal to its take-off. The right V4 is likely occluded proximal to the takeoff of the right PICA, with no definitive flow seen in the right PICA, which is new from prior exam. 2. Redemonstrated moderate to severe calcified plaque in the carotid siphons, right greater than left, overall unchanged. 3. 65-70% stenosis of the proximal left ICA, secondary to calcified and noncalcified plaque. Electronically Signed   By: Wiliam Ke M.D.   On: 02/16/2021 21:08   DG Chest 1 View  Result Date: 02/16/2021 CLINICAL DATA:  Status post fall with left-sided weakness. EXAM: CHEST  1 VIEW COMPARISON:  December 26, 2020 FINDINGS: Mild to moderate severity, predominant stable areas of scarring and/or atelectasis are noted within the bilateral lung bases there is no evidence of a pleural effusion or pneumothorax. The cardiac silhouette is mildly enlarged and unchanged in size. The visualized skeletal structures are unremarkable. IMPRESSION: Stable cardiomegaly with mild to moderate severity,  predominant stable areas of bibasilar scarring and/or atelectasis. Electronically Signed   By: Aram Candela M.D.   On: 02/16/2021 23:51   DG Shoulder Right  Result Date: 02/16/2021 CLINICAL DATA:  Fall. EXAM: RIGHT SHOULDER - 2+ VIEW COMPARISON:  Right shoulder x-ray 05/02/2020 FINDINGS: Healed greater tuberosity fracture is present. No definite acute fracture or dislocation. There are stable mild degenerative changes of the acromioclavicular joint. There are vascular calcifications in the soft tissues. IMPRESSION: 1. No acute bony abnormality. Electronically Signed   By: Darliss Cheney M.D.   On: 02/16/2021 23:38   CT ANGIO NECK W OR WO CONTRAST  Result Date: 02/16/2021 CLINICAL DATA:  Left-sided weakness EXAM: CT ANGIOGRAPHY HEAD AND NECK TECHNIQUE: Multidetector CT imaging of the head and neck was performed using the standard protocol during bolus administration of intravenous contrast. Multiplanar CT image reconstructions and MIPs were obtained to evaluate the vascular anatomy. Carotid stenosis measurements (when applicable) are obtained utilizing NASCET criteria, using the distal internal carotid diameter as the denominator. CONTRAST:  75mL OMNIPAQUE IOHEXOL 350 MG/ML SOLN  COMPARISON:  02/28/2020, correlation is made with same day CT head FINDINGS: CT HEAD FINDINGS For noncontrast findings, please see same day CT head. CTA NECK FINDINGS Aortic arch: Two-vessel arch with a common origin of the brachiocephalic and left common carotid arteries. Imaged portion shows no evidence of aneurysm or dissection. No significant stenosis of the major arch vessel origins. Moderate calcified and noncalcified plaque. Right carotid system: No evidence of dissection, stenosis (50% or greater) or occlusion. Mild narrowing at the bifurcation, secondary to calcified and noncalcified plaque, which is not hemodynamically significant. Left carotid system: 65-70% stenosis of the proximal left ICA, secondary to calcified  and noncalcified plaque. No evidence of dissection or occlusion. Vertebral arteries: Severe stenosis at the origin of the right vertebral artery (series 8, images 295-300), which appears worse than on the prior exam. Minimal flow in the right V2 and V3 segments. The extracranial left vertebral artery is without significant stenosis. Skeleton: No acute osseous abnormality. Degenerative changes in the cervical spine. Other neck: Negative. Upper chest: Mild ground-glass opacities, which are nonspecific may represent pulmonary edema. No pleural effusion. Unchanged prominent mediastinal lymph nodes. Review of the MIP images confirms the above findings CTA HEAD FINDINGS Anterior circulation: Redemonstrated moderate to severe calcified plaque in the carotid siphons, right greater than left, overall unchanged. A1 segments patent. Normal anterior communicating artery. Anterior cerebral arteries are patent to their distal aspects. No M1 stenosis or occlusion. Normal MCA bifurcations. Distal MCA branches perfused and symmetric. Posterior circulation: Focal plaque in the left V4, which is otherwise patent. The diminutive right V4 is poorly opacified intracranially, with near complete occlusion just proximal to the take-off of the right PICA and only intermittent faint flow to the vertebrobasilar junction, some of which may be retrograde. The right PICA is not opacified. The left PICA is patent. Basilar patent to its distal aspect. Superior cerebellar arteries patent bilaterally. PCAs perfused to their distal aspects without stenosis. The bilateral posterior communicating arteries are not visualized Venous sinuses: As permitted by contrast timing, patent. Anatomic variants: None significant Review of the MIP images confirms the above findings IMPRESSION: 1. Poor opacification of the right vertebral artery throughout its course, with increased focal stenosis just distal to its take-off. The right V4 is likely occluded proximal to  the takeoff of the right PICA, with no definitive flow seen in the right PICA, which is new from prior exam. 2. Redemonstrated moderate to severe calcified plaque in the carotid siphons, right greater than left, overall unchanged. 3. 65-70% stenosis of the proximal left ICA, secondary to calcified and noncalcified plaque. Electronically Signed   By: Wiliam Ke M.D.   On: 02/16/2021 21:08   MR BRAIN WO CONTRAST  Result Date: 02/17/2021 CLINICAL DATA:  Stroke follow-up EXAM: MRI HEAD WITHOUT CONTRAST TECHNIQUE: Multiplanar, multiecho pulse sequences of the brain and surrounding structures were obtained without intravenous contrast. COMPARISON:  CT and CTA from yesterday FINDINGS: Brain: Wedges of restricted diffusion spanning the right corona radiata anteriorly and extending from the posterior putamen to the caudate nucleus posteriorly. Lacune with facilitated peripheral diffusion in the periventricular white matter on the right. Extensive chronic small vessel ischemia in the hemispheric white matter with confluent periventricular ischemic gliosis and multiple chronic lacunar infarcts. Small remote right cerebellar infarction. Vascular: Absent right vertebral flow void correlating with prior CTA. Skull and upper cervical spine: Normal marrow signal. Sinuses/Orbits: Negative. IMPRESSION: 1. Acute to subacute perforator infarcts at the right basal ganglia and corona radiata. 2. Extensive chronic small  vessel ischemia. Electronically Signed   By: Tiburcio Pea M.D.   On: 02/17/2021 06:38   DG Knee Complete 4 Views Left  Result Date: 02/16/2021 CLINICAL DATA:  Status post fall. EXAM: LEFT KNEE - COMPLETE 4+ VIEW COMPARISON:  None. FINDINGS: No evidence of an acute fracture or dislocation. No evidence of arthropathy or other focal bone abnormality. Moderate to marked severity vascular calcification is seen. A trace joint effusion is seen. IMPRESSION: 1. No acute osseous abnormality. 2. Trace joint effusion.  Electronically Signed   By: Aram Candela M.D.   On: 02/16/2021 23:52   DG Knee Complete 4 Views Right  Result Date: 02/16/2021 CLINICAL DATA:  Status post fall. EXAM: RIGHT KNEE - COMPLETE 4+ VIEW COMPARISON:  None. FINDINGS: No evidence of an acute fracture or dislocation. No evidence of arthropathy or other focal bone abnormality. Moderate to marked severity vascular calcification is seen. A very small joint effusion is seen. IMPRESSION: 1. No acute fracture or dislocation. 2. Very small joint effusion. Electronically Signed   By: Aram Candela M.D.   On: 02/16/2021 23:47   ECHOCARDIOGRAM COMPLETE  Result Date: 02/17/2021    ECHOCARDIOGRAM REPORT   Patient Name:   Brent Young Date of Exam: 02/17/2021 Medical Rec #:  [id number]        Height:       73.0 in Accession #:    [id number]       Weight:       275.0 lb Date of Birth:  10/24/1960        BSA:          2.463 m Patient Age:    60 years         BP:           188/110 mmHg Patient Gender: M                HR:           97 bpm. Exam Location:  Inpatient Procedure: 2D Echo, Cardiac Doppler, Color Doppler and Intracardiac            Opacification Agent Indications:    Stroke  History:        Patient has no prior history of Echocardiogram examinations. 2nd                 stroke.  Sonographer:    Roosvelt Maser RDCS Referring Phys: [id number] BRADLEY S CHOTINER IMPRESSIONS  1. Left ventricular ejection fraction, by estimation, is 60 to 65%. The left ventricle has normal function. The left ventricle has no regional wall motion abnormalities. There is moderate left ventricular hypertrophy. Indeterminate diastolic filling due  to E-A fusion.  2. Right ventricular systolic function is normal. The right ventricular size is normal. There is normal pulmonary artery systolic pressure.  3. Mild mitral valve regurgitation.  4. AV is thickened, calcified. Peak and mean gradients through the valveare 24 and 13 mm Hg respectively AVA (VTI) is 1.87 cm2  Dimensionless index is 0.46 COnsistent with mild AS. Marland Kitchen The aortic valve is tricuspid. Aortic valve regurgitation is not visualized. Aortic valve sclerosis/calcification is present, without any evidence of aortic stenosis. FINDINGS  Left Ventricle: Left ventricular ejection fraction, by estimation, is 60 to 65%. The left ventricle has normal function. The left ventricle has no regional wall motion abnormalities. The left ventricular internal cavity size was normal in size. There is  moderate left ventricular hypertrophy. Indeterminate diastolic filling due to E-A fusion. Right Ventricle: The  right ventricular size is normal. Right vetricular wall thickness was not assessed. Right ventricular systolic function is normal. There is normal pulmonary artery systolic pressure. The tricuspid regurgitant velocity is 2.70 m/s, and with an assumed right atrial pressure of 3 mmHg, the estimated right ventricular systolic pressure is 32.2 mmHg. Left Atrium: Left atrial size was normal in size. Right Atrium: Right atrial size was normal in size. Pericardium: Trivial pericardial effusion is present. Mitral Valve: There is mild thickening of the mitral valve leaflet(s). Mild mitral annular calcification. Mild mitral valve regurgitation. Tricuspid Valve: The tricuspid valve is normal in structure. Tricuspid valve regurgitation is trivial. Aortic Valve: AV is thickened, calcified. Peak and mean gradients through the valveare 24 and 13 mm Hg respectively AVA (VTI) is 1.87 cm2 Dimensionless index is 0.46 COnsistent with mild AS. The aortic valve is tricuspid. Aortic valve regurgitation is not visualized. Aortic valve sclerosis/calcification is present, without any evidence of aortic stenosis. Aortic valve mean gradient measures 12.5 mmHg. Aortic valve peak gradient measures 23.7 mmHg. Aortic valve area, by VTI measures 1.90 cm. Pulmonic Valve: The pulmonic valve was normal in structure. Pulmonic valve regurgitation is not visualized.  Aorta: The aortic root and ascending aorta are structurally normal, with no evidence of dilitation. IAS/Shunts: No atrial level shunt detected by color flow Doppler.  LEFT VENTRICLE PLAX 2D LVIDd:         4.90 cm LVIDs:         3.30 cm LV PW:         1.60 cm LV IVS:        1.60 cm LVOT diam:     2.30 cm LV SV:         86 LV SV Index:   35 LVOT Area:     4.15 cm  RIGHT VENTRICLE RV Basal diam:  4.70 cm RV Mid diam:    3.80 cm LEFT ATRIUM              Index        RIGHT ATRIUM           Index LA diam:        4.90 cm  1.99 cm/m   RA Area:     21.70 cm LA Vol (A2C):   124.0 ml 50.34 ml/m  RA Volume:   60.60 ml  24.60 ml/m LA Vol (A4C):   103.0 ml 41.81 ml/m LA Biplane Vol: 114.0 ml 46.28 ml/m  AORTIC VALVE AV Area (Vmax):    1.96 cm AV Area (Vmean):   1.94 cm AV Area (VTI):     1.90 cm AV Vmax:           243.50 cm/s AV Vmean:          164.500 cm/s AV VTI:            0.456 m AV Peak Grad:      23.7 mmHg AV Mean Grad:      12.5 mmHg LVOT Vmax:         115.00 cm/s LVOT Vmean:        76.700 cm/s LVOT VTI:          0.208 m LVOT/AV VTI ratio: 0.46  AORTA Ao Root diam: 3.40 cm Ao Asc diam:  3.30 cm TRICUSPID VALVE TR Peak grad:   29.2 mmHg TR Vmax:        270.00 cm/s  SHUNTS Systemic VTI:  0.21 m Systemic Diam: 2.30 cm Dietrich Pates MD Electronically signed by Gunnar Fusi  Tenny Craw MD Signature Date/Time: 02/17/2021/5:10:24 PM    Final    CT HEAD CODE STROKE WO CONTRAST  Result Date: 02/16/2021 CLINICAL DATA:  Code stroke.  Left-sided weakness EXAM: CT HEAD WITHOUT CONTRAST TECHNIQUE: Contiguous axial images were obtained from the base of the skull through the vertex without intravenous contrast. COMPARISON:  12/26/2020 FINDINGS: Brain: No acute cortical infarction. Periventricular white matter changes, likely the sequela of chronic small vessel ischemic disease, with redemonstrated small vessel infarcts in the bilateral periventricular white matter, basal ganglia, left pons, and right cerebellar hemisphere. No acute  hemorrhage, mass mass effect, or midline shift. No extra-axial collection or hydrocephalus. Vascular: No hyperdense vessel or unexpected calcification. Skull: Normal. Negative for fracture or focal lesion. Sinuses/Orbits: No acute finding. Other: The mastoid air cells are well aerated. ASPECTS [facility] Stroke Program Early CT Score) - Ganglionic level infarction (caudate, lentiform nuclei, internal capsule, insula, M1-M3 cortex): 7 - Supraganglionic infarction (M4-M6 cortex): 3 Total score (0-10 with 10 being normal): 10 IMPRESSION: 1. No acute intracranial process. 2. ASPECTS is 10 Code stroke imaging results were communicated on 02/16/2021 at 8:27 pm to provider Dr. Derry Lory via secure text paging. Electronically Signed   By: Wiliam Ke M.D.   On: 02/16/2021 20:29    Scheduled Meds:   stroke: mapping our early stages of recovery book   Does not apply Once   acetaminophen  1,000 mg Oral Once   aspirin EC  325 mg Oral Daily   Or   aspirin  300 mg Rectal Daily   atorvastatin  40 mg Oral Daily   gabapentin  300 mg Oral TID   insulin aspart  0-9 Units Subcutaneous TID WC   linagliptin  5 mg Oral Daily   living well with diabetes book   Does not apply Once   Oxcarbazepine  300 mg Oral TID   Continuous Infusions:  sodium chloride 100 mL/hr at 02/18/21 0600   potassium chloride 10 mEq (02/18/21 1446)     LOS: 1 day   Time spent: 25 minutes.  Tyrone Nine, MD [facility] [email or web address] 02/18/2021, 2:54 PM

## 2021-02-18 NOTE — Evaluation (Signed)
Occupational Therapy Evaluation Patient Details Name: Brent Young MRN: [id number] DOB: May 20, 1960 Today's Date: 02/18/2021   History of Present Illness Brent Young is a 60 y.o. male who presented to the ED 11/27 as a code stroke with left arm and leg weakness. MRI (+) Acute to subacute perforator infarcts at the right basal ganglia and corona radiata. Pt with a history of lacunar CVAs, arthritis, T2DM, HTN, and obesity.   Clinical Impression   Brent Young was mod I PTA, driving and living in a 2 level home with bed/bath upstairs. He is now limited by L extremity weakness, incoordination, dizziness and balance. He required max A for bed mobility and intermittently required assistance for sitting balance. He is needing mod-max +2 assist for ADLs. He will benefit from continued OT acutely. Recommend CIR at discharge.     Recommendations for follow up therapy are one component of a multi-disciplinary discharge planning process, led by the attending physician.  Recommendations may be updated based on patient status, additional functional criteria and insurance authorization.   Follow Up Recommendations  Acute inpatient rehab (3hours/day)    Assistance Recommended at Discharge Frequent or constant Supervision/Assistance  Functional Status Assessment  Patient has had a recent decline in their functional status and demonstrates the ability to make significant improvements in function in a reasonable and predictable amount of time.  Equipment Recommendations  Tub/shower bench;Wheelchair (measurements OT);Wheelchair cushion (measurements OT)    Recommendations for Other Services       Precautions / Restrictions Precautions Precautions: Fall Restrictions Weight Bearing Restrictions: No      Mobility Bed Mobility Overal bed mobility: Needs Assistance Bed Mobility: Rolling;Sidelying to Sit;Sit to Sidelying Rolling: Max assist Sidelying to sit: Max assist     Sit to sidelying: Max  assist General bed mobility comments: max A for physical assist with all tasks    Transfers Overall transfer level: Needs assistance                 General transfer comment: defered for safety      Balance Overall balance assessment: Needs assistance Sitting-balance support: Feet supported Sitting balance-Leahy Scale: Fair Sitting balance - Comments: fair-poor, pt wtih 2 LOB that required assist to correct                                   ADL either performed or assessed with clinical judgement   ADL Overall ADL's : Needs assistance/impaired Eating/Feeding: Set up;Sitting   Grooming: Moderate assistance;Sitting   Upper Body Bathing: Moderate assistance;Sitting   Lower Body Bathing: Maximal assistance;+2 for physical assistance;+2 for safety/equipment;Sit to/from stand   Upper Body Dressing : Moderate assistance;Sitting   Lower Body Dressing: Maximal assistance;+2 for physical assistance;+2 for safety/equipment;Sit to/from stand   Toilet Transfer: Maximal assistance;+2 for safety/equipment;+2 for physical assistance;Stand-pivot;BSC/3in1           Functional mobility during ADLs: Maximal assistance;+2 for physical assistance;+2 for safety/equipment (max +1 for bed mobility) General ADL Comments: Pt is limited by L extremity weakness, ROM, propriception and balance     Vision Baseline Vision/History: 0 No visual deficits Ability to See in Adequate Light: 0 Adequate Patient Visual Report: No change from baseline Vision Assessment?: No apparent visual deficits     Perception     Praxis      Pertinent Vitals/Pain Pain Assessment: No/denies pain     Hand Dominance Right   Extremity/Trunk Assessment Upper Extremity Assessment  Upper Extremity Assessment: LUE deficits/detail LUE Deficits / Details: minimal movement at hand wrist and eblow. some sewllign noted LUE Sensation: decreased light touch;decreased proprioception LUE Coordination:  decreased fine motor;decreased gross motor   Lower Extremity Assessment Lower Extremity Assessment: Defer to PT evaluation   Cervical / Trunk Assessment Cervical / Trunk Assessment: Normal   Communication Communication Communication: No difficulties   Cognition Arousal/Alertness: Awake/alert Behavior During Therapy: WFL for tasks assessed/performed Overall Cognitive Status: Within Functional Limits for tasks assessed                                       General Comments  VSS on RA, RN present fo rpart of thie session    Exercises     Shoulder Instructions      Home Living Family/patient expects to be discharged to:: Private residence Living Arrangements: Spouse/significant other;Children;Other relatives Available Help at Discharge: Family;Available 24 hours/day Type of Home: House Home Access: Stairs to enter [employer] of Steps: 2-3   Home Layout: Two level;Bed/bath upstairs Alternate Level Stairs-Number of Steps: flight   Bathroom Shower/Tub: Chief Strategy Officer: Standard     Home Equipment: Agricultural consultant (2 wheels);Cane - single point;Shower seat          Prior Functioning/Environment Prior Level of Function : Independent/Modified Independent             Mobility Comments: was not using AD ADLs Comments: son does assist with housework IADLs        OT Problem List: Decreased strength;Decreased activity tolerance;Decreased range of motion;Impaired balance (sitting and/or standing);Decreased safety awareness;Decreased knowledge of use of DME or AE;Decreased knowledge of precautions;Pain      OT Treatment/Interventions: Self-care/ADL training;Therapeutic exercise;Therapeutic activities;Balance training    OT Goals(Current goals can be found in the care plan section) Acute Rehab OT Goals Patient Stated Goal: get balance back OT Goal Formulation: With patient Time For Goal Achievement: 03/04/21 Potential to  Achieve Goals: Fair ADL Goals Pt Will Perform Grooming: with set-up;sitting Pt Will Perform Lower Body Dressing: with mod assist;with adaptive equipment;sit to/from stand Pt Will Transfer to Toilet: squat pivot transfer;with min assist;bedside commode Pt/caregiver will Perform Home Exercise Program: Left upper extremity;Increased ROM;Increased strength;With written HEP provided  OT Frequency: Min 2X/week   Barriers to D/C:            Co-evaluation              AM-PAC OT "6 Clicks" Daily Activity     Outcome Measure Help from another person eating meals?: A Little Help from another person taking care of personal grooming?: A Lot Help from another person toileting, which includes using toliet, bedpan, or urinal?: A Lot Help from another person bathing (including washing, rinsing, drying)?: A Lot Help from another person to put on and taking off regular upper body clothing?: A Lot Help from another person to put on and taking off regular lower body clothing?: A Lot 6 Click Score: 13   End of Session Nurse Communication: Mobility status  Activity Tolerance: Patient tolerated treatment well Patient left: in bed;with call bell/phone within reach;with bed alarm set  OT Visit Diagnosis: Unsteadiness on feet (R26.81);Other abnormalities of gait and mobility (R26.89);History of falling (Z91.81);Other symptoms and signs involving the nervous system (R29.898);Other symptoms and signs involving cognitive function;Pain                Time:  9357-0177 OT Time Calculation (min): 24 min Charges:  OT General Charges $OT Visit: 1 Visit OT Evaluation $OT Eval Moderate Complexity: 1 Mod OT Treatments $Therapeutic Activity: 8-22 mins   Brent Young A Ileene Allie 02/18/2021, 12:40 PM

## 2021-02-18 NOTE — Progress Notes (Signed)
Notified MD of pt's BP  Most current BP is 175/104 (125) taken at 2351...Marland Kitchen prior to my shift, BP was 198/106 (132) taken at 1851   PO Hydralazine ordered

## 2021-02-18 NOTE — Progress Notes (Signed)
OT Cancellation Note  Patient Details Name: Brent Young MRN: [id number] DOB: 05-31-1960   Cancelled Treatment:    Reason Eval/Treat Not Completed: Active bedrest order (Messaged MD; OT evaluation to f/u when appropriate.)  Arayah Krouse A Frona Yost 02/18/2021, 10:39 AM

## 2021-02-18 NOTE — Evaluation (Signed)
Speech Language Pathology Evaluation Patient Details Name: BARKLEY KRATOCHVIL MRN: [id number] DOB: 12/09/1960 Today's Date: 02/18/2021 Time: 7867-6720 SLP Time Calculation (min) (ACUTE ONLY): 42 min  Problem List:  Patient Active Problem List   Diagnosis Date Noted   Stroke (HCC) 02/17/2021   Essential hypertension 02/17/2021   Diabetes mellitus type 2 in obese (HCC) 02/17/2021   Elevated troponin 02/17/2021   Prolonged QT interval 02/17/2021   Past Medical History:  Past Medical History:  Diagnosis Date   Allergy    Arthritis    Diabetes mellitus without complication (HCC)    Hypertension    Stroke [facility])    Past Surgical History:  Past Surgical History:  Procedure Laterality Date   FRACTURE SURGERY Left    Patient fractured left arm   HPI:  Pt is a 60 year old male admitted with Recurrent acute CVA, right vertebral artery stenosis on background extensive small vessel ischemic cerebrovascular disease, history of L basal ganglia and hippocampal ICH: MRI shows Right basal ganglia, corona radiata, with left hemiparesis, dysphagia. Pt had modified barium swallow (MBS) at [facility] outpatient services on 03-12-20 showsing mild pharyngeal dysphagia with Delayed initiation of the swallow with delayed epiglottic inversion. Reduced vestibular closure. Intermittent backflow of barium into the pyriform sinuses contributing up to min residue after the swallow. Frequent penetration occured, no history of pulmonary problems. Pt recommended to consume regular/thin.   Assessment / Plan / Recommendation Clinical Impression  Pt informally assessed through participation in complex verbal and functional tasks. He was observed to have fluent speech and langauge, communicating complex family details and details of recent medical history accurately. He was able to demonstrate excellent short term memory, recalling room service phone number after a 5 minute delay and other new information. Pt alternating and  diving attention between multiple tasks, expressing intellectual, emergent and anticipatory awareness of new physical deficits and utilizing reasoning and problem solving in a functional task. No SLP f/u needed for cognition. Will sign off.    SLP Assessment  SLP Recommendation/Assessment: Patient does not need any further Speech Lanaguage Pathology Services SLP Visit Diagnosis: Dysphagia, unspecified (R13.10)    Recommendations for follow up therapy are one component of a multi-disciplinary discharge planning process, led by the attending physician.  Recommendations may be updated based on patient status, additional functional criteria and insurance authorization.    Follow Up Recommendations  No SLP follow up    Assistance Recommended at Discharge  Intermittent Supervision/Assistance  Functional Status Assessment Patient has had a recent decline in their functional status and demonstrates the ability to make significant improvements in function in a reasonable and predictable amount of time.  Frequency and Duration min 2x/week         SLP Evaluation Cognition  Overall Cognitive Status: Within Functional Limits for tasks assessed       Comprehension  Auditory Comprehension Overall Auditory Comprehension: Appears within functional limits for tasks assessed    Expression Verbal Expression Overall Verbal Expression: Appears within functional limits for tasks assessed   Oral / Motor  Oral Motor/Sensory Function Overall Oral Motor/Sensory Function: Within functional limits Motor Speech Overall Motor Speech: Appears within functional limits for tasks assessed   GO                    Beckhem Isadore, [name] 02/18/2021, 9:19 AM

## 2021-02-18 NOTE — Progress Notes (Signed)
Inpatient Diabetes Program Recommendations  AACE/ADA: New Consensus Statement on Inpatient Glycemic Control (2015)  Target Ranges:  Prepandial:   less than 140 mg/dL      Peak postprandial:   less than 180 mg/dL (1-2 hours)      Critically ill patients:  140 - 180 mg/dL   Lab Results  Component Value Date   GLUCAP 137 (H) 02/18/2021   HGBA1C 9.9 (H) 02/17/2021    Review of Glycemic Control  Latest Reference Range & Units 02/16/21 20:08 02/17/21 06:55 02/17/21 11:47 02/18/21 02:30 02/18/21 05:52  Glucose-Capillary 70 - 99 mg/dL 540 (H) 981 (H) 191 (H) 139 (H) 137 (H)  (H): Data is abnormally high  Diabetes history: DM2 Outpatient Diabetes medications: Amaryl 4 mg QD, Januvia 50  mg QD, Metformin 1000 mg BID Current orders for Inpatient glycemic control: Novolog 0-9 units Q6H PRN, Tradjent 5 mg QD  Inpatient Diabetes Program Recommendations:    Novolog 0-9 units TID and 0-5 units QHS.  Will speak with patient regarding A1C and diabetes management.  Ordered LWWD booklet.  Will continue to follow while inpatient.  Thank you, Dulce Sellar, RN, BSN Diabetes Coordinator Inpatient Diabetes Program [phone] (team pager from 8a-5p)

## 2021-02-18 NOTE — Evaluation (Signed)
Clinical/Bedside Swallow Evaluation Patient Details  Name: Brent Young MRN: [id number] Date of Birth: 1960-12-14  Today's Date: 02/18/2021 Time: SLP Start Time (ACUTE ONLY): 0816 SLP Stop Time (ACUTE ONLY): 0858 SLP Time Calculation (min) (ACUTE ONLY): 42 min  Past Medical History:  Past Medical History:  Diagnosis Date   Allergy    Arthritis    Diabetes mellitus without complication (HCC)    Hypertension    Stroke [facility])    Past Surgical History:  Past Surgical History:  Procedure Laterality Date   FRACTURE SURGERY Left    Patient fractured left arm   HPI:  Pt is a 59 year old male admitted with Recurrent acute CVA, right vertebral artery stenosis on background extensive small vessel ischemic cerebrovascular disease, history of L basal ganglia and hippocampal ICH: MRI shows Right basal ganglia, corona radiata, with left hemiparesis, dysphagia. Pt had modified barium swallow (MBS) at [facility] outpatient services on 03-12-20 showsing mild pharyngeal dysphagia with Delayed initiation of the swallow with delayed epiglottic inversion. Reduced vestibular closure. Intermittent backflow of barium into the pyriform sinuses contributing up to min residue after the swallow. Frequent penetration occured, no history of pulmonary problems. Pt recommended to consume regular/thin.    Assessment / Plan / Recommendation  Clinical Impression  Pt presents with his baseline swallow function, reported in MBS in 2021 and confirmed by pt who is cognitively intact. Pt has no new dysarthria or overt oral weakness. He is able to consume small sips of water, but with large volume he has an immediate hard cough response, consistent with pts prior complaints and finding of mild pharyngeal dysphagia with penetration events but no significant aspiration on MBS. Will initiate a dys 3 diet (mech soft) given that pt has difficulty using his left had to cut up food. Will f/u for tolerance and precautions as needed.    Pt was planned to have an angiogram today and was ordered NPO after midnight but he reported this to me and said that he would not consent to the test if it meant he could not eat his breakfast this am. SLP reported to RN and MD and followed pts wishes and ordered meal and diet though at the time of this note it has still not arrived and could be held if MD chooses to do so. SLP Visit Diagnosis: Dysphagia, unspecified (R13.10)    Aspiration Risk  Mild aspiration risk    Diet Recommendation Dysphagia 3 (Mech soft);Thin liquid   Liquid Administration via: Cup;Straw Medication Administration: Whole meds with liquid Supervision: Patient able to self feed Compensations: Slow rate;Small sips/bites Postural Changes: Seated upright at 90 degrees    Other  Recommendations Oral Care Recommendations: Oral care BID    Recommendations for follow up therapy are one component of a multi-disciplinary discharge planning process, led by the attending physician.  Recommendations may be updated based on patient status, additional functional criteria and insurance authorization.  Follow up Recommendations No SLP follow up      Assistance Recommended at Discharge Intermittent Supervision/Assistance  Functional Status Assessment Patient has had a recent decline in their functional status and demonstrates the ability to make significant improvements in function in a reasonable and predictable amount of time.  Frequency and Duration min 2x/week  1 week       Prognosis        Swallow Study   General HPI: Pt is a 60 year old male admitted with Recurrent acute CVA, right vertebral artery stenosis on background extensive  small vessel ischemic cerebrovascular disease, history of L basal ganglia and hippocampal ICH: MRI shows Right basal ganglia, corona radiata, with left hemiparesis, dysphagia. Pt had modified barium swallow (MBS) at [facility] outpatient services on 03-12-20 showsing mild pharyngeal dysphagia  with Delayed initiation of the swallow with delayed epiglottic inversion. Reduced vestibular closure. Intermittent backflow of barium into the pyriform sinuses contributing up to min residue after the swallow. Frequent penetration occured, no history of pulmonary problems. Pt recommended to consume regular/thin. Type of Study: Bedside Swallow Evaluation Diet Prior to this Study: NPO Temperature Spikes Noted: No Respiratory Status: Room air History of Recent Intubation: No Behavior/Cognition: Alert;Cooperative;Pleasant mood Oral Cavity Assessment: Within Functional Limits Oral Care Completed by SLP: No Oral Cavity - Dentition: Adequate natural dentition Vision: Functional for self-feeding Self-Feeding Abilities: Needs assist;Needs set up Patient Positioning: Upright in bed Baseline Vocal Quality: Normal Volitional Cough: Strong Volitional Swallow: Able to elicit    Oral/Motor/Sensory Function Overall Oral Motor/Sensory Function: Within functional limits   Ice Chips     Thin Liquid Thin Liquid: Impaired Presentation: Cup;Straw Pharyngeal  Phase Impairments: Cough - Immediate    Nectar Thick Nectar Thick Liquid: Not tested   Honey Thick Honey Thick Liquid: Within functional limits   Puree Puree: Within functional limits   Solid     Solid: Within functional limits      Brent Young, Brent Young 02/18/2021,9:06 AM

## 2021-02-19 ENCOUNTER — Inpatient Hospital Stay (HOSPITAL_COMMUNITY): Payer: Medicaid Other

## 2021-02-19 ENCOUNTER — Encounter (HOSPITAL_COMMUNITY): Payer: Self-pay | Admitting: Family Medicine

## 2021-02-19 DIAGNOSIS — I248 Other forms of acute ischemic heart disease: Secondary | ICD-10-CM

## 2021-02-19 DIAGNOSIS — M25511 Pain in right shoulder: Secondary | ICD-10-CM

## 2021-02-19 DIAGNOSIS — E876 Hypokalemia: Secondary | ICD-10-CM

## 2021-02-19 DIAGNOSIS — I2489 Other forms of acute ischemic heart disease: Secondary | ICD-10-CM

## 2021-02-19 DIAGNOSIS — E782 Mixed hyperlipidemia: Secondary | ICD-10-CM

## 2021-02-19 DIAGNOSIS — E785 Hyperlipidemia, unspecified: Secondary | ICD-10-CM

## 2021-02-19 DIAGNOSIS — D72829 Elevated white blood cell count, unspecified: Secondary | ICD-10-CM

## 2021-02-19 DIAGNOSIS — E669 Obesity, unspecified: Secondary | ICD-10-CM

## 2021-02-19 HISTORY — PX: IR US GUIDE VASC ACCESS RIGHT: IMG2390

## 2021-02-19 HISTORY — PX: IR ANGIO VERTEBRAL SEL SUBCLAVIAN INNOMINATE UNI R MOD SED: IMG5365

## 2021-02-19 HISTORY — PX: IR ANGIO INTRA EXTRACRAN SEL COM CAROTID INNOMINATE UNI L MOD SED: IMG5358

## 2021-02-19 HISTORY — PX: IR ANGIO INTRA EXTRACRAN SEL INTERNAL CAROTID UNI R MOD SED: IMG5362

## 2021-02-19 HISTORY — PX: IR ANGIO VERTEBRAL SEL VERTEBRAL UNI L MOD SED: IMG5367

## 2021-02-19 LAB — GLUCOSE, CAPILLARY
Glucose-Capillary: 242 mg/dL — ABNORMAL HIGH (ref 70–99)
Glucose-Capillary: 226 mg/dL — ABNORMAL HIGH (ref 70–99)
Glucose-Capillary: 200 mg/dL — ABNORMAL HIGH (ref 70–99)
Glucose-Capillary: 138 mg/dL — ABNORMAL HIGH (ref 70–99)
Glucose-Capillary: 209 mg/dL — ABNORMAL HIGH (ref 70–99)

## 2021-02-19 LAB — BASIC METABOLIC PANEL
Anion gap: 9 (ref 5–15)
BUN: 14 mg/dL (ref 8–23)
CO2: 29 mmol/L (ref 22–32)
Calcium: 8.9 mg/dL (ref 8.9–10.3)
Chloride: 100 mmol/L (ref 98–111)
Creatinine, Ser: 1.02 mg/dL (ref 0.61–1.24)
GFR, Estimated: >60 mL/min (ref >60)
Glucose, Bld: 229 mg/dL — ABNORMAL HIGH (ref 70–99)
Potassium: 3.6 mmol/L (ref 3.5–5.1)
Sodium: 138 mmol/L (ref 135–145)

## 2021-02-19 LAB — MAGNESIUM: Magnesium: 2 mg/dL (ref 1.7–2.4)

## 2021-02-19 MED ORDER — IOHEXOL 300 MG/ML  SOLN
100.0000 mL | Freq: Once | INTRAMUSCULAR | Status: AC | PRN
  Administered 2021-02-19: 48 mL via INTRAVENOUS

## 2021-02-19 MED ORDER — MIDAZOLAM HCL 2 MG/2ML IJ SOLN
INTRAMUSCULAR | Status: AC | PRN
  Administered 2021-02-19: 1 mg via INTRAVENOUS

## 2021-02-19 MED ORDER — LABETALOL HCL 5 MG/ML IV SOLN
INTRAVENOUS | Status: AC
  Filled 2021-02-19: qty 4

## 2021-02-19 MED ORDER — FENTANYL CITRATE (PF) 100 MCG/2ML IJ SOLN
INTRAMUSCULAR | Status: AC
  Filled 2021-02-19: qty 2

## 2021-02-19 MED ORDER — LABETALOL HCL 5 MG/ML IV SOLN
INTRAVENOUS | Status: AC | PRN
  Administered 2021-02-19 (×2): 10 mg via INTRAVENOUS

## 2021-02-19 MED ORDER — MIDAZOLAM HCL 2 MG/2ML IJ SOLN
INTRAMUSCULAR | Status: AC
  Filled 2021-02-19: qty 2

## 2021-02-19 MED ORDER — INSULIN STARTER KIT- PEN NEEDLES (ENGLISH)
1.0000 | Freq: Once | Status: DC
  Filled 2021-02-19: qty 1

## 2021-02-19 MED ORDER — LIDOCAINE HCL 1 % IJ SOLN
INTRAMUSCULAR | Status: AC
  Administered 2021-02-19: 10 mL
  Filled 2021-02-19: qty 20

## 2021-02-19 MED ORDER — FENTANYL CITRATE (PF) 100 MCG/2ML IJ SOLN
INTRAMUSCULAR | Status: AC | PRN
  Administered 2021-02-19: 50 ug via INTRAVENOUS

## 2021-02-19 MED ORDER — HEPARIN SODIUM (PORCINE) 1000 UNIT/ML IJ SOLN
INTRAMUSCULAR | Status: AC
  Filled 2021-02-19: qty 10

## 2021-02-19 MED ORDER — POTASSIUM CHLORIDE CRYS ER 20 MEQ PO TBCR
40.0000 meq | EXTENDED_RELEASE_TABLET | Freq: Once | ORAL | Status: AC
  Filled 2021-02-19: qty 2

## 2021-02-19 NOTE — Progress Notes (Signed)
Inpatient Rehab Admissions Coordinator:  ? ?Per therapy recommendations,  patient was screened for CIR candidacy by Dacotah Cabello, MS, CCC-SLP. At this time, Pt. Appears to be a a potential candidate for CIR. I will place   order for rehab consult per protocol for full assessment. Please contact me any with questions. ? ?Boni Maclellan, MS, CCC-SLP ?Rehab Admissions Coordinator  ?[phone] (celll) ?[phone] (office) ? ?

## 2021-02-19 NOTE — Progress Notes (Signed)
Speech Language Pathology Treatment: Dysphagia  Patient Details Name: Brent Young MRN: [id number] DOB: 12/02/1960 Today's Date: 02/19/2021 Time: 1100-1108 SLP Time Calculation (min) (ACUTE ONLY): 8 min  Assessment / Plan / Recommendation Clinical Impression  Pt is NPO for a procedure, but SLP reviewed precautions with pt again reports baseline tolerance. Pt able to continue mechanical soft diet to keep foods easy to manipulate with one hand. No SLP f/u needed will sign off.   HPI HPI: Pt is a 60 year old male admitted with Recurrent acute CVA, right vertebral artery stenosis on background extensive small vessel ischemic cerebrovascular disease, history of L basal ganglia and hippocampal ICH: MRI shows Right basal ganglia, corona radiata, with left hemiparesis, dysphagia. Pt had modified barium swallow (MBS) at [facility] outpatient services on 03-12-20 showsing mild pharyngeal dysphagia with Delayed initiation of the swallow with delayed epiglottic inversion. Reduced vestibular closure. Intermittent backflow of barium into the pyriform sinuses contributing up to min residue after the swallow. Frequent penetration occured, no history of pulmonary problems. Pt recommended to consume regular/thin.      SLP Plan  All goals met      Recommendations for follow up therapy are one component of a multi-disciplinary discharge planning process, led by the attending physician.  Recommendations may be updated based on patient status, additional functional criteria and insurance authorization.    Recommendations  Diet recommendations: Thin liquid;Dysphagia 3 (mechanical soft) Liquids provided via: Cup;Straw Medication Administration: Whole meds with liquid Supervision: Patient able to self feed                Oral Care Recommendations: Oral care BID Follow Up Recommendations: No SLP follow up Assistance recommended at discharge: None Plan: All goals met       GO                 ,  Caroline  02/19/2021, 1:01 PM 

## 2021-02-19 NOTE — Progress Notes (Addendum)
PROGRESS NOTE  Brent Young UYQ:[id number] DOB: [birth year] DOA: 02/16/2021 PCP: Grayce Sessions, NP   LOS: 2 days   Brief Narrative / Interim history: 60 year old male with prior history of lacunar CVAs, arthritis, DM2, HTN, obesity class II, who comes to the hospital on 11/27 as a code stroke with left arm and left leg weakness.  This happened upon waking up, last known well was at 10 PM the night prior.  Neurology evaluated in the ED, an MRI demonstrated acute-subacute infarcts in the right basal ganglia and corona radiata on background of extensive chronic small vessel ischemia.  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  Neuro interventional IR was also consulted with plans for cerebral angiogram which now has been delayed due to patient's not being able to remain n.p.o. and eating in the morning.  This is scheduled again for today  Subjective / 24h Interval events: Doing well this morning, anxious about procedure and asking me what time exactly is going on down there.  Complains of persistent left-sided weakness  Assessment & Plan: Principal Problem:   Stroke [facility]) Active Problems:   Essential hypertension   Diabetes mellitus type 2 in obese (HCC)   Elevated troponin   Prolonged QT interval   Shoulder pain, right   Demand ischemia (HCC)   Hyperlipidemia, unspecified   Hypokalemia   Leukocytosis   Obesity, Class II, BMI 35-39.9  Principal Problem Recurrent acute to subacute perforator infarcts at the right basal ganglia and corona radiata due to right ICA siphon severe stenosis -neurology and interventional radiology was consulted.  CT angiogram showed right V4 likely occluded proximal to the takeoff of the right PICA with no definitive flow seen in the right PICA which is new from prior exam.  He also has  significant extensive intracranial atherosclerosis.  He underwent a 2D echocardiogram showed an EF of 60-65%.  LDL was 98 and he is on atorvastatin.  A1c was 9.9.  Urine drug screen was positive for THC.  Currently he is on full dose aspirin 325 as well as Plavix and Lovenox for VTE prophylaxis.  He is scheduled to get cerebral angiogram today.  Therapy recommendations are CIR which is pending completion of work-up  Active Problems Essential hypertension-allow permissive hypertension for now but BP goal for him is 130-160 given severe intravascular stenosis.  Pending cerebral angiogram continue to hold home medications  Right shoulder pain - Consistent with mild AC joint arthritis possibly from falling and overuse. XR without fracture or dislocation.  Supportive management  Tobacco use - Cessation counseling provided   Demand myocardial ischemia -high-sensitivity troponin flat, not in a pattern consistent with ACS.  No chest pain   Hypokalemia - Supplement again for potassium of 3.6, with a goal of 4.0  Hyperlipidemia-continue atorvastatin 40 mg  Leukocytosis - Suspect reactive to CVA. Resolved without abx.    T2DM - HbA1c 9.9% - SSI. Continue home linagliptin  CBG (last 3)  Recent Labs    02/18/21 2354 02/19/21 0615 02/19/21 0858  GLUCAP 221* 242* 226*    Obesity, class II-he would benefit from weight loss.  BMI 36  Scheduled Meds:   stroke: mapping our early stages of recovery book   Does not apply Once   acetaminophen  1,000 mg Oral Once   aspirin EC  325 mg Oral  Daily   atorvastatin  40 mg Oral Daily   clopidogrel  75 mg Oral Daily   enoxaparin (LOVENOX) injection  40 mg Subcutaneous Q24H   gabapentin  300 mg Oral TID   insulin aspart  0-9 Units Subcutaneous TID WC   linagliptin  5 mg Oral Daily   living well with diabetes book   Does not apply Once   Oxcarbazepine  300 mg Oral TID   Continuous Infusions: PRN Meds:.acetaminophen **OR** acetaminophen (TYLENOL) oral  liquid 160 mg/5 mL **OR** acetaminophen, hydrALAZINE, ketorolac  Diet Orders (From admission, onward)     Start     Ordered   02/19/21 0001  Diet NPO time specified Except for: Sips with Meds  Diet effective midnight       Question:  Except for  Answer:  Sips with Meds   02/18/21 1517            DVT prophylaxis: enoxaparin (LOVENOX) injection 40 mg Start: 02/18/21 1545 enoxaparin (LOVENOX) 40 MG/0.4ML injection Start: 02/18/21 1545 SCD's Start: 02/17/21 0227     Code Status: Full Code  Family Communication: no family at bedside   Status is: Inpatient  Remains inpatient appropriate because: Ongoing work-up  Level of care: Telemetry Cardiac  Consultants:  Neurology, neuroIR  Procedures:  2D echo  Microbiology  none  Antimicrobials: none    Objective: Vitals:   02/18/21 1929 02/18/21 2330 02/19/21 0347 02/19/21 0842  BP: (!) 178/95 (!) 161/85 (!) 155/85 (!) 168/95  Pulse: (!) 108  (!) 106 91  Resp: 17  18 15   Temp: 98 F (36.7 C)  (!) 97.5 F (36.4 C) 98 F (36.7 C)  TempSrc: Oral  Oral Oral  SpO2: 92%  92% 95%  Weight:      Height:        Intake/Output Summary (Last 24 hours) at 02/19/2021 1047 Last data filed at 02/19/2021 0900 Gross per 24 hour  Intake 1888.08 ml  Output --  Net 1888.08 ml   Filed Weights   02/16/21 2106  Weight: 124.7 kg    Examination:  Constitutional: NAD Eyes: no scleral icterus ENMT: Mucous membranes are moist.  Neck: normal, supple Respiratory: clear to auscultation bilaterally, no wheezing, no crackles. Normal respiratory effort.  Cardiovascular: Regular rate and rhythm, no murmurs / rubs / gallops. No LE edema.  Abdomen: non distended, no tenderness. Bowel sounds positive.  Musculoskeletal: no clubbing / cyanosis.  Skin: no rashes Neurologic: CN 2-12 grossly intact.  LUE, LLE significant weakness, good strength on right Psychiatric: Normal judgment and insight. Alert and oriented x 3.    Data Reviewed: I  have independently reviewed following labs and imaging studies   CBC: Recent Labs  Lab 02/16/21 2013 02/16/21 2100 02/18/21 0353  WBC  --  12.4* 9.0  NEUTROABS  --  8.4* 5.0  HGB 19.4* 16.8 15.6  HCT 57.0* 49.8 47.7  MCV  --  91.5 91.7  PLT  --  341 334   Basic Metabolic Panel: Recent Labs  Lab 02/16/21 2013 02/16/21 2100 02/18/21 0353 02/19/21 0043  NA 141 137 141 138  K 3.1* 3.1* 2.9* 3.6  CL 98 98 103 100  CO2  --  27 28 29   GLUCOSE 187* 181* 124* 229*  BUN 7* 7* 7* 14  CREATININE 0.70 0.88 0.71 1.02  CALCIUM  --  9.2 8.8* 8.9  MG  --  1.8  --  2.0   Liver Function Tests: Recent Labs  Lab 02/16/21 2100  AST 25  ALT 17  ALKPHOS 99  BILITOT 1.5*  PROT 7.8  ALBUMIN 3.8   Coagulation Profile: Recent Labs  Lab 02/16/21 2100 02/18/21 0353  INR 1.2 1.2   HbA1C: Recent Labs    02/17/21 0327  HGBA1C 9.9*   CBG: Recent Labs  Lab 02/18/21 1231 02/18/21 1725 02/18/21 2354 02/19/21 0615 02/19/21 0858  GLUCAP 182* 243* 221* 242* 226*    Recent Results (from the past 240 hour(s))  Resp Panel by RT-PCR (Flu A&B, Covid) Nasopharyngeal Swab     Status: None   Collection Time: 02/16/21  8:08 PM   Specimen: Nasopharyngeal Swab; Nasopharyngeal(NP) swabs in vial transport medium  Result Value Ref Range Status   SARS Coronavirus 2 by RT PCR NEGATIVE NEGATIVE Final    Comment: (NOTE) SARS-CoV-2 target nucleic acids are NOT DETECTED.  The SARS-CoV-2 RNA is generally detectable in upper respiratory specimens during the acute phase of infection. The lowest concentration of SARS-CoV-2 viral copies this assay can detect is 138 copies/mL. A negative result does not preclude SARS-Cov-2 infection and should not be used as the sole basis for treatment or other patient management decisions. A negative result may occur with  improper specimen collection/handling, submission of specimen other than nasopharyngeal swab, presence of viral mutation(s) within the areas  targeted by this assay, and inadequate number of viral copies(<138 copies/mL). A negative result must be combined with clinical observations, patient history, and epidemiological information. The expected result is Negative.  Fact Sheet for Patients:  BloggerCourse.com  Fact Sheet for Healthcare Providers:  SeriousBroker.it  This test is no t yet approved or cleared by the [place] FDA and  has been authorized for detection and/or diagnosis of SARS-CoV-2 by FDA under an Emergency Use Authorization (EUA). This EUA will remain  in effect (meaning this test can be used) for the duration of the COVID-19 declaration under Section 564(b)(1) of the Act, 21 U.S.C.section 360bbb-3(b)(1), unless the authorization is terminated  or revoked sooner.       Influenza A by PCR NEGATIVE NEGATIVE Final   Influenza B by PCR NEGATIVE NEGATIVE Final    Comment: (NOTE) The Xpert Xpress SARS-CoV-2/FLU/RSV plus assay is intended as an aid in the diagnosis of influenza from Nasopharyngeal swab specimens and should not be used as a sole basis for treatment. Nasal washings and aspirates are unacceptable for Xpert Xpress SARS-CoV-2/FLU/RSV testing.  Fact Sheet for Patients: BloggerCourse.com  Fact Sheet for Healthcare Providers: SeriousBroker.it  This test is not yet approved or cleared by the [place] FDA and has been authorized for detection and/or diagnosis of SARS-CoV-2 by FDA under an Emergency Use Authorization (EUA). This EUA will remain in effect (meaning this test can be used) for the duration of the COVID-19 declaration under Section 564(b)(1) of the Act, 21 U.S.C. section 360bbb-3(b)(1), unless the authorization is terminated or revoked.  Performed at [facility], [address] [place], [place] [address]      Radiology Studies: No results found.  Pamella Pert, MD,  PhD [facility]  Between 7 am - 7 pm I am available, please contact me via Amion (for emergencies) or Securechat (non urgent messages)  Between 7 pm - 7 am I am not available, please contact night coverage MD/APP via Amion

## 2021-02-19 NOTE — Progress Notes (Signed)
STROKE TEAM PROGRESS NOTE   INTERVAL HISTORY No family is at the bedside.  Patient neuro stable.  Had cerebral angiogram with Dr. Sherlon Handing today showed left ICA 65 to 70% stenosis, bilateral ICA siphon mild to moderate stenosis, right VA severe stenosis.  OBJECTIVE Vitals:   02/19/21 1630 02/19/21 1700 02/19/21 1800 02/19/21 1900  BP: (!) 169/80 (!) 164/85 (!) 173/80 (!) 166/78  Pulse: 86 89 95 100  Resp: 20 18 20    Temp: 97.6 F (36.4 C) 97.8 F (36.6 C) 98 F (36.7 C) 97.9 F (36.6 C)  TempSrc: Oral Oral Oral Oral  SpO2: 99% 100% 99% 98%  Weight:      Height:        CBC:  Recent Labs  Lab 02/16/21 2100 02/18/21 0353  WBC 12.4* 9.0  NEUTROABS 8.4* 5.0  HGB 16.8 15.6  HCT 49.8 47.7  MCV 91.5 91.7  PLT 341 334    Basic Metabolic Panel:  Recent Labs  Lab 02/16/21 2100 02/18/21 0353 02/19/21 0043  NA 137 141 138  K 3.1* 2.9* 3.6  CL 98 103 100  CO2 27 28 29   GLUCOSE 181* 124* 229*  BUN 7* 7* 14  CREATININE 0.88 0.71 1.02  CALCIUM 9.2 8.8* 8.9  MG 1.8  --  2.0    Lipid Panel:     Component Value Date/Time   CHOL 148 02/17/2021 0328   CHOL 141 10/08/2020 1140   TRIG 120 02/17/2021 0328   HDL 26 (L) 02/17/2021 0328   HDL 43 10/08/2020 1140   CHOLHDL 5.7 02/17/2021 0328   VLDL 24 02/17/2021 0328   LDLCALC 98 02/17/2021 0328   LDLCALC 75 10/08/2020 1140   HgbA1c:  Lab Results  Component Value Date   HGBA1C 9.9 (H) 02/17/2021   Urine Drug Screen:     Component Value Date/Time   LABOPIA NONE DETECTED 02/17/2021 0831   COCAINSCRNUR NONE DETECTED 02/17/2021 0831   LABBENZ NONE DETECTED 02/17/2021 0831   AMPHETMU NONE DETECTED 02/17/2021 0831   THCU POSITIVE (A) 02/17/2021 0831   LABBARB NONE DETECTED 02/17/2021 0831    Alcohol Level No results found for: ETH  IMAGING   No results found.    ECG - ST rate 110 BPM. (See cardiology reading for complete details)  PHYSICAL EXAM Blood pressure (!) 166/78, pulse 100, temperature 97.9 F (36.6  C), temperature source Oral, resp. rate 20, height 6\' 1"  (1.854 m), weight 124.7 kg, SpO2 98 %.  Temp:  [97.5 F (36.4 C)-98 F (36.7 C)] 97.9 F (36.6 C) (11/30 1900) Pulse Rate:  [84-106] 100 (11/30 1900) Resp:  [13-22] 20 (11/30 1800) BP: (150-191)/(73-118) 166/78 (11/30 1900) SpO2:  [92 %-100 %] 98 % (11/30 1900)  General - Well nourished, well developed, in no apparent distress.  Ophthalmologic - fundi not visualized due to noncooperation.  Cardiovascular - Regular rhythm and rate, Hypertensive.  Mental Status -  Level of arousal and orientation to time, place, and person were intact. Language including expression, naming, repetition, comprehension was assessed and found intact. Attention span and concentration were normal. Recent and remote memory were intact. Fund of Knowledge was assessed and was intact.  Cranial Nerves II - XII - II - Visual field intact OU. III, IV, VI - Extraocular movements intact. V - Facial sensation intact bilaterally. VII - Facial movement intact bilaterally. VIII - Hearing & vestibular intact bilaterally. X - Palate elevates symmetrically. XI - Chin turning & shoulder shrug intact bilaterally. XII - Tongue protrusion intact.  Motor Strength - Bulk was normal and fasciculations were absent No movement in left lower extremity.  5/5 strength in right extremities.  LUE at shoulder 0/5, Left hand grip strength 3/5 LLE 0/5, at knee 0/5  Motor Tone - Muscle tone was assessed at the neck and appendages and was normal.  Reflexes - The patient's reflexes were symmetrical in all extremities and he had no pathological reflexes.  Sensory - Light touch, temperature/pinprick were assessed and were symmetrical Decreased sensation to light touch in LUE  Coordination - FTN unable to complete due to weakness in LUE HTS deferred due to plegia in LLE Rapid alternating movement are slowed in LUE  Gait and Station - deferred.   ASSESSMENT/PLAN Mr.  Brent Young is a 60 y.o. 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 He rolled out of the bed and fell on the floor. He was unable to get up and stayed on the floor all day. On exam, patient AAOx3, no aphasia, follows simple commands.  Found to have left hemiplegia and hemiparesthesia.  Etiology for current stroke likely due to right ICA siphon high-grade stenosis.  Plan for cerebral angiogram in tomorrow. with Dr. Sherlon Handing.     Stroke: acute to subacute right BG and CR infarcts secondary to small versus large vessel atherosclerosis in the setting of multiple uncontrolled risk factors CT Head - No acute intracranial process. ASPECTS is 10. 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 Re-demonstrated moderate to severe calcified plaque in the carotid siphons, right greater than left, overall unchanged. 65-70% stenosis of the proximal left ICA MRI head - Acute to subacute perforator infarcts at the right basal ganglia and corona radiata. Extensive chronic small vessel ischemia. Cerebral angiogram left ICA 65 to 70% stenosis, bilateral ICA siphon mild to moderate stenosis, right VA severe stenosis. 2D Echo - EF 60 to 65% LDL - 98 HgbA1c -9.9 UDS - positive for THC VTE prophylaxis - Lovenox No antithrombotic prior to admission, ASA 325 and Plavix 75mg  DAPT for 3 months and then aspirin alone due to intracranial stenosis Patient will be counseled to be compliant with his antithrombotic medications Ongoing aggressive stroke risk factor management Therapy recommendations:  CIR Disposition:  Pending  Intracranial vascular stenosis 02/2020 CTA head and neck left ICA 75% stenosis, bilateral siphon moderate to  severe stenosis right more than left This admission re-demonstrated moderate to severe calcified plaque in the carotid siphons, right greater than left, overall unchanged. 65-70% stenosis of the proximal left ICA Cerebral angiogram left ICA 65 to 70% stenosis, bilateral ICA siphon mild to moderate stenosis, right VA severe stenosis. Continue ASA 325 and Plavix 75mg  DAPT for 3 months and then aspirin alone due to intracranial stenosis Discussed with Dr. 03/2020 interventional neuroradiology  History of stroke   02/2020 admitted at [facility] for slurred speech, right facial droop, and fall.  CT possible left pontine infarct.  CTA head and neck left ICA 75% stenosis, bilateral siphon moderate to severe stenosis right more than left, right VA diminutive and occluded after PICA takeoff.  MRI left pontine infarct and left periventricular white matter small infarcts, as well as old lacunar infarcts.  Also  showed old ICH and left BG and right parahippocampal region.  A1c 10.4, LDL 72, renal artery ultrasound negative.  Discharged on aspirin Plavix DAPT for 90 days and Lipitor 10. 12/2020 admitted for confusion, CT negative, EF 55 to 60%, THC positive, alcohol 33.  A1c 8.9, DVT negative.  MRI was not done due to claustrophobia.  Patient discharged on aspirin 325 and Lipitor.  Hypertension Home BP meds: Cozaar, HCTZ, Toprol, Norvasc Stable BP goal 130-160 given severe intravascular stenosis before intervention. Long-term BP goal normotensive after intervention  Hyperlipidemia Home Lipid lowering medication: Lipitor 10 mg daily LDL 98, goal < 70 Current lipid lowering medication: Lipitor 40 mg daily Continue statin at discharge  Diabetes, uncontrolled Home diabetic meds: Tradjenta , metformin, Amaryl Current diabetic meds: Tradjenta HgbA1c -9.9 goal < 7.0 SSI CBG monitoring Close PCP follow-up for better DM control  Tobacco abuse Current smoker Smoking cessation counseling  provided Pt is willing to quit  Other Stroke Risk Factors ETOH use, advised to drink no more than 1 alcoholic beverage per day. Obesity, Body mass index is 36.28 kg/m., recommend weight loss, diet and exercise as appropriate  Substance use - marijuana   Other Active Problems Code status - Full code Tachycardia - low 100's Leukocytosis - WBC's - 12.4 (afebrile) Hypokalemia - 3.1 - supplemented History of cirrhosis - currently LFTs WNL and INR normal   Hospital day # 2  Neurology will sign off. Please call with questions. Pt will follow up with stroke clinic NP at [facility] in about 4 weeks. Thanks for the consult.   Marvel Plan, MD PhD Stroke Neurology 02/19/2021 7:46 PM    To contact Stroke Continuity provider, please refer to [email or web address]. After hours, contact General Neurology

## 2021-02-19 NOTE — Progress Notes (Signed)
Inpatient Diabetes Program Recommendations  AACE/ADA: New Consensus Statement on Inpatient Glycemic Control (2015)  Target Ranges:  Prepandial:   less than 140 mg/dL      Peak postprandial:   less than 180 mg/dL (1-2 hours)      Critically ill patients:  140 - 180 mg/dL   Lab Results  Component Value Date   GLUCAP 226 (H) 02/19/2021   HGBA1C 9.9 (H) 02/17/2021    Review of Glycemic Control  Latest Reference Range & Units 02/18/21 12:31 02/18/21 17:25 02/18/21 23:54 02/19/21 06:15 02/19/21 08:58  Glucose-Capillary 70 - 99 mg/dL 450 (H) 388 (H) 828 (H) 242 (H) 226 (H)  (H): Data is abnormally high  Diabetes history: DM2 Outpatient Diabetes medications: Amaryl 4 mg QD, Januvia 50 mg QD, Metformin 1000 mg BID Current orders for Inpatient glycemic control: Novolog 0-9 units TID, Tradjenta 5 mg QD  Inpatient Diabetes Program Recommendations:    Might consider: Semglee 12 units QD (0.1 unit/kg)   Will speak with patient today regarding A1C and likely need for insulin at DC.    Will continue to follow while inpatient.  Thank you, Dulce Sellar, RN, BSN Diabetes Coordinator Inpatient Diabetes Program [phone] (team pager from 8a-5p)

## 2021-02-19 NOTE — Procedures (Signed)
INTERVENTIONAL NEURORADIOLOGY BRIEF POSTPROCEDURE NOTE   Diagnostic cerebral angiogram    Attending: Dr. Baldemar Lenis   Assistant: None.    Diagnosis: Intracranial atherosclerotic disease.    Access site: Right common femoral artery, 5 [place].    Access closure: 5 [place] ExoSeal.    Anesthesia: Moderate sedation.    Medication used: 1 mg Versed IV; 50 mcg Fentanyl IV; 20 mg of labetalol IV.   Complications: None.    Estimated blood loss: Negligible.    Specimen: None.    Findings:  Atherosclerotic changes of the left carotid bifurcation with calcified plaques and ulceration resulting in approximately 65-70% stenosis.  Atherosclerotic changes of the right carotid bifurcation without hemodynamically significant stenosis.  Atherosclerotic changes of the bilateral carotid siphons with mild stenosis at the cavernous segments.  Severe stenosis at the origin of the right vertebral artery.    The patient tolerated the procedure well without incident or complication and is in stable condition.

## 2021-02-19 NOTE — Sedation Documentation (Signed)
Right femoral sheath removed, 47fr exoseal closure device deployed by MD.

## 2021-02-19 NOTE — Progress Notes (Signed)
PT Cancellation Note  Patient Details Name: Brent Young MRN: [id number] DOB: 1961/01/06   Cancelled Treatment:    Reason Eval/Treat Not Completed: Patient at procedure or test/unavailable. Pt currently off unit for cerebral angiogram. Will check back at the next available date to continue with PT POC.    Marylynn Pearson 02/19/2021, 2:19 PM  Conni Slipper, PT, DPT Acute Rehabilitation Services Pager: [phone] Office: [phone]

## 2021-02-20 LAB — GLUCOSE, CAPILLARY
Glucose-Capillary: 231 mg/dL — ABNORMAL HIGH (ref 70–99)
Glucose-Capillary: 267 mg/dL — ABNORMAL HIGH (ref 70–99)
Glucose-Capillary: 237 mg/dL — ABNORMAL HIGH (ref 70–99)

## 2021-02-20 MED ORDER — METOPROLOL TARTRATE 5 MG/5ML IV SOLN
5.0000 mg | Freq: Once | INTRAVENOUS | Status: AC
  Administered 2021-02-20: 5 mg via INTRAVENOUS
  Filled 2021-02-20: qty 5

## 2021-02-20 NOTE — Progress Notes (Signed)
Physical Therapy Treatment Patient Details Name: Brent Young MRN: [id number] DOB: [birth year] Today's Date: 02/20/2021   History of Present Illness Pt is a 60 y/o male presenting w/ LUE and LLE weakness. 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 PMH significant for lacunar CVA's, DM II, HTN.    PT Comments    Pt progressing towards PT goals. Pt exhibiting loss of ROM in the ankle, knee, hip, and elbow. Pt required max assist +2 for balance, support, safety, and management of L UE/LE during transfers. Initially attempted to transfer with the [facility]; however, pt was unable to power up into standing with the R side. Used the [facility] to transfer the pt into the chair. Communicated with RN that pt was in the chair w/ the [facility] pad at the end of the session. Pt will continue to benefit from skilled PT to increase independence and safety with mobility. Will continue to follow.   Recommendations for follow up therapy are one component of a multi-disciplinary discharge planning process, led by the attending physician.  Recommendations may be updated based on patient status, additional functional criteria and insurance authorization.  Follow Up Recommendations  Acute inpatient rehab (3hours/day)     Assistance Recommended at Discharge    Equipment Recommendations  Wheelchair (measurements PT);Wheelchair cushion (measurements PT)    Recommendations for Other Services Rehab consult     Precautions / Restrictions Precautions Precautions: Fall Precaution Comments: Dense L side     Mobility  Bed Mobility Overal bed mobility: Needs Assistance Bed Mobility: Supine to Sit Rolling: Max assist Sidelying to sit: Max assist Supine to sit: Max assist;+2 for physical assistance;HOB elevated     General bed mobility comments: Pt sitting with HOB elevated and was able to swing LE's around to EOB with max assist to transition to EOB. Bed pad utilized to assist. +2  for trunk elevation.    Transfers Overall transfer level: Needs assistance Equipment used: Ambulation equipment used Transfers: Sit to/from Stand;Bed to chair/wheelchair/BSC Sit to Stand: Max assist;+2 physical assistance;From elevated surface           General transfer comment: Initially attempted to transfer with the [facility]; however, pt had difficulty with powering up w/ R side w/ +2 assist. Used [employer] to lift pt from bed to chair. Transfer via Lift Equipment: [employer]  Ambulation/Gait                   Stairs             Wheelchair Mobility    Modified Rankin (Stroke Patients Only)       Balance Overall balance assessment: Needs assistance Sitting-balance support: Feet supported Sitting balance-Leahy Scale: Zero Sitting balance - Comments: Max assist. Presents with heavy L lean                                    Cognition Arousal/Alertness: Awake/alert Behavior During Therapy: WFL for tasks assessed/performed Overall Cognitive Status: Within Functional Limits for tasks assessed                                          Exercises General Exercises - Upper Extremity Elbow Flexion: PROM;Left;Supine (HOB elevated) Elbow Extension: PROM;Left;Supine (HOB elevated) Wrist Flexion: PROM;Left;Supine (HOB elevated) Wrist Extension: PROM;Left;Supine (HOB elevated) Other Exercises Other  Exercises: PROM of L ankle in supine into DF/PF (HOB elevated) Other Exercises: PROM of L knee in supine into flexion and extension (HOB elevated) Other Exercises: PROM of L hip in supine into flexion    General Comments        Pertinent Vitals/Pain Pain Assessment: No/denies pain    Home Living                          Prior Function            PT Goals (current goals can now be found in the care plan section) Acute Rehab PT Goals Patient Stated Goal: Today, goal was to be up in the chair. Motivated for rehab. PT Goal  Formulation: With patient Time For Goal Achievement: 03/04/21 Potential to Achieve Goals: Good Progress towards PT goals: Progressing toward goals    Frequency    Min 4X/week      PT Plan      Co-evaluation              AM-PAC PT "6 Clicks" Mobility   Outcome Measure  Help needed turning from your back to your side while in a flat bed without using bedrails?: A Lot Help needed moving from lying on your back to sitting on the side of a flat bed without using bedrails?: Total Help needed moving to and from a bed to a chair (including a wheelchair)?: Total Help needed standing up from a chair using your arms (e.g., wheelchair or bedside chair)?: Total Help needed to walk in hospital room?: Total Help needed climbing 3-5 steps with a railing? : Total 6 Click Score: 7    End of Session Equipment Utilized During Treatment: Gait belt Activity Tolerance: Patient tolerated treatment well Patient left: in chair;with call bell/phone within reach;with chair alarm set Nurse Communication: Mobility status;Need for lift equipment (Lift pad left in room for back to bed) PT Visit Diagnosis: Unsteadiness on feet (R26.81);Hemiplegia and hemiparesis Hemiplegia - Right/Left: Left Hemiplegia - dominant/non-dominant: Non-dominant Hemiplegia - caused by: Cerebral infarction     Time: 1115-1206 PT Time Calculation (min) (ACUTE ONLY): 51 min  Charges:  $Therapeutic Exercise: 8-22 mins $Therapeutic Activity: 23-37 mins                     Janyce Llanos, SPT Acute Rehabilitation Services Pager: [phone] Office: [phone]    Reynald Woods 02/20/2021, 1:41 PM

## 2021-02-20 NOTE — Progress Notes (Signed)
Referring Physician(s): Dr.  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 He denies any pain or discomfort and states he feels very well. Left side of body remains very weak. Patient unable to lift left arm or leg but was able to slightly move fingers on left hand.    Allergies: Patient has no known allergies.  Medications: Prior to Admission medications   Medication Sig Start Date End Date Taking? Authorizing Provider  amLODipine (NORVASC) 10 MG tablet Take 1 tablet (10 mg total) by mouth daily. 05/28/20  Yes Grayce Sessions, NP  atorvastatin (LIPITOR) 10 MG tablet Take 1 tablet (10 mg total) by mouth daily. 05/28/20  Yes Grayce Sessions, NP  ergocalciferol (VITAMIN D2) 1.25 MG (50000 UT) capsule Take 50,000 Units by mouth every Wednesday. 01/03/21  Yes [provider]  gabapentin (NEURONTIN) 300 MG capsule TAKE 1 CAPSULE BY MOUTH THREE TIMES A DAY Patient taking differently: Take 300 mg by mouth 3 (three) times daily. 12/16/20  Yes Grayce Sessions, NP  glimepiride (AMARYL) 4 MG tablet Take 1 tablet (4 mg total) by mouth every morning. 10/08/20  Yes Grayce Sessions, NP  hydrochlorothiazide (HYDRODIURIL) 25 MG tablet Take 1 tablet (25 mg total) by mouth daily. 06/28/20  Yes Grayce Sessions, NP  losartan (COZAAR) 25 MG tablet Take 1 tablet (25 mg total) by mouth daily. 05/28/20  Yes Grayce Sessions, NP  metFORMIN (GLUCOPHAGE) 1000 MG tablet Take 1 tablet (1,000 mg total) by mouth 2 (two) times daily with a meal. 05/28/20  Yes Grayce Sessions, NP  metoprolol succinate (TOPROL-XL) 25 MG 24 hr tablet Take 25 mg by mouth 3 (three) times daily. 12/30/20  Yes [provider]  Oxcarbazepine (TRILEPTAL) 300 MG tablet Take 1 tablet (300 mg total) by mouth 3 (three) times daily. 02/04/20  Yes Grayce Sessions, NP  sitaGLIPtin (JANUVIA) 50 MG tablet Take 1 tablet (50 mg total) by mouth daily. 10/08/20  Yes Grayce Sessions, NP     Vital Signs: BP (!) 168/110 (BP Location: Left Wrist)   Pulse 99   Temp 98 F (36.7 C) (Oral)   Resp 20   Ht  (1.854 m)   Wt 275 lb (124.7 kg)   SpO2 94%   BMI 36.28 kg/m   Physical Exam Constitutional:      General: He is not in acute distress.    Appearance: He is not ill-appearing.  Cardiovascular:     Comments: Right groin vascular site is clean, soft, dry and non-tender.  Pulmonary:     Effort: Pulmonary effort is normal.  Skin:    General: Skin is warm and dry.  Neurological:     Mental Status: He is alert and oriented to person, place, and time.     Comments: Unable to  move left side of body with the exception of a flicker of movement in the left hand.     Imaging: CT ANGIO HEAD W OR WO CONTRAST  Result Date: 02/16/2021 CLINICAL DATA:  Left-sided weakness EXAM: CT ANGIOGRAPHY HEAD AND NECK TECHNIQUE: Multidetector CT imaging of the head and neck was performed using the standard protocol during bolus administration of intravenous contrast. Multiplanar CT image reconstructions and  MIPs were obtained to evaluate the vascular anatomy. Carotid stenosis measurements (when applicable) are obtained utilizing NASCET criteria, using the distal internal carotid diameter as the denominator. CONTRAST:  75mL OMNIPAQUE IOHEXOL 350 MG/ML SOLN COMPARISON:  02/28/2020, correlation is made with same day CT head FINDINGS: CT HEAD FINDINGS For noncontrast findings, please see same day CT head. CTA NECK FINDINGS Aortic arch: Two-vessel arch with a common origin of the brachiocephalic and left common carotid arteries. Imaged portion shows no evidence of aneurysm or dissection. No significant stenosis of the major arch  vessel origins. Moderate calcified and noncalcified plaque. Right carotid system: No evidence of dissection, stenosis (50% or greater) or occlusion. Mild narrowing at the bifurcation, secondary to calcified and noncalcified plaque, which is not hemodynamically significant. Left carotid system: 65-70% stenosis of the proximal left ICA, secondary to calcified and noncalcified plaque. No evidence of dissection or occlusion. Vertebral arteries: Severe stenosis at the origin of the right vertebral artery (series 8, images 295-300), which appears worse than on the prior exam. Minimal flow in the right V2 and V3 segments. The extracranial left vertebral artery is without significant stenosis. Skeleton: No acute osseous abnormality. Degenerative changes in the cervical spine. Other neck: Negative. Upper chest: Mild ground-glass opacities, which are nonspecific may represent pulmonary edema. No pleural effusion. Unchanged prominent mediastinal lymph nodes. Review of the MIP images confirms the above findings CTA HEAD FINDINGS Anterior circulation: Redemonstrated moderate to severe calcified plaque in the carotid siphons, right greater than left, overall unchanged. A1 segments patent. Normal anterior communicating artery. Anterior cerebral arteries are patent to their distal aspects. No M1 stenosis or occlusion. Normal MCA bifurcations. Distal MCA branches perfused and symmetric. Posterior circulation: Focal plaque in the left V4, which is otherwise patent. The diminutive right V4 is poorly opacified intracranially, with near complete occlusion just proximal to the take-off of the right PICA and only intermittent faint flow to the vertebrobasilar junction, some of which may be retrograde. The right PICA is not opacified. The left PICA is patent. Basilar patent to its distal aspect. Superior cerebellar arteries patent bilaterally. PCAs perfused to their distal aspects without stenosis. The bilateral posterior communicating  arteries are not visualized Venous sinuses: As permitted by contrast timing, patent. Anatomic variants: None significant Review of the MIP images confirms the above findings IMPRESSION: 1. Poor opacification of the right vertebral artery throughout its course, with increased focal stenosis just distal to its take-off. The right V4 is likely occluded proximal to the takeoff of the right PICA, with no definitive flow seen in the right PICA, which is new from prior exam. 2. Redemonstrated moderate to severe calcified plaque in the carotid siphons, right greater than left, overall unchanged. 3. 65-70% stenosis of the proximal left ICA, secondary to calcified and noncalcified plaque. Electronically Signed   By: Wiliam Ke M.D.   On: 02/16/2021 21:08   DG Chest 1 View  Result Date: 02/16/2021 CLINICAL DATA:  Status post fall with left-sided weakness. EXAM: CHEST  1 VIEW COMPARISON:  December 26, 2020 FINDINGS: Mild to moderate severity, predominant stable areas of scarring and/or atelectasis are noted within the bilateral lung bases there is no evidence of a pleural effusion or pneumothorax. The cardiac silhouette is mildly enlarged and unchanged in size. The visualized skeletal structures are unremarkable. IMPRESSION: Stable cardiomegaly with mild to moderate severity, predominant stable areas of bibasilar scarring and/or atelectasis. Electronically Signed   By: Aram Candela M.D.   On: 02/16/2021 23:51   DG Shoulder  Right  Result Date: 02/16/2021 CLINICAL DATA:  Fall. EXAM: RIGHT SHOULDER - 2+ VIEW COMPARISON:  Right shoulder x-ray 05/02/2020 FINDINGS: Healed greater tuberosity fracture is present. No definite acute fracture or dislocation. There are stable mild degenerative changes of the acromioclavicular joint. There are vascular calcifications in the soft tissues. IMPRESSION: 1. No acute bony abnormality. Electronically Signed   By: Darliss Cheney M.D.   On: 02/16/2021 23:38   CT ANGIO NECK W OR WO  CONTRAST  Result Date: 02/16/2021 CLINICAL DATA:  Left-sided weakness EXAM: CT ANGIOGRAPHY HEAD AND NECK TECHNIQUE: Multidetector CT imaging of the head and neck was performed using the standard protocol during bolus administration of intravenous contrast. Multiplanar CT image reconstructions and MIPs were obtained to evaluate the vascular anatomy. Carotid stenosis measurements (when applicable) are obtained utilizing NASCET criteria, using the distal internal carotid diameter as the denominator. CONTRAST:  75mL OMNIPAQUE IOHEXOL 350 MG/ML SOLN COMPARISON:  02/28/2020, correlation is made with same day CT head FINDINGS: CT HEAD FINDINGS For noncontrast findings, please see same day CT head. CTA NECK FINDINGS Aortic arch: Two-vessel arch with a common origin of the brachiocephalic and left common carotid arteries. Imaged portion shows no evidence of aneurysm or dissection. No significant stenosis of the major arch vessel origins. Moderate calcified and noncalcified plaque. Right carotid system: No evidence of dissection, stenosis (50% or greater) or occlusion. Mild narrowing at the bifurcation, secondary to calcified and noncalcified plaque, which is not hemodynamically significant. Left carotid system: 65-70% stenosis of the proximal left ICA, secondary to calcified and noncalcified plaque. No evidence of dissection or occlusion. Vertebral arteries: Severe stenosis at the origin of the right vertebral artery (series 8, images 295-300), which appears worse than on the prior exam. Minimal flow in the right V2 and V3 segments. The extracranial left vertebral artery is without significant stenosis. Skeleton: No acute osseous abnormality. Degenerative changes in the cervical spine. Other neck: Negative. Upper chest: Mild ground-glass opacities, which are nonspecific may represent pulmonary edema. No pleural effusion. Unchanged prominent mediastinal lymph nodes. Review of the MIP images confirms the above findings CTA  HEAD FINDINGS Anterior circulation: Redemonstrated moderate to severe calcified plaque in the carotid siphons, right greater than left, overall unchanged. A1 segments patent. Normal anterior communicating artery. Anterior cerebral arteries are patent to their distal aspects. No M1 stenosis or occlusion. Normal MCA bifurcations. Distal MCA branches perfused and symmetric. Posterior circulation: Focal plaque in the left V4, which is otherwise patent. The diminutive right V4 is poorly opacified intracranially, with near complete occlusion just proximal to the take-off of the right PICA and only intermittent faint flow to the vertebrobasilar junction, some of which may be retrograde. The right PICA is not opacified. The left PICA is patent. Basilar patent to its distal aspect. Superior cerebellar arteries patent bilaterally. PCAs perfused to their distal aspects without stenosis. The bilateral posterior communicating arteries are not visualized Venous sinuses: As permitted by contrast timing, patent. Anatomic variants: None significant Review of the MIP images confirms the above findings IMPRESSION: 1. Poor opacification of the right vertebral artery throughout its course, with increased focal stenosis just distal to its take-off. The right V4 is likely occluded proximal to the takeoff of the right PICA, with no definitive flow seen in the right PICA, which is new from prior exam. 2. Redemonstrated moderate to severe calcified plaque in the carotid siphons, right greater than left, overall unchanged. 3. 65-70% stenosis of the proximal left ICA, secondary to calcified and noncalcified plaque.  Electronically Signed   By: Wiliam Ke M.D.   On: 02/16/2021 21:08   MR BRAIN WO CONTRAST  Result Date: 02/17/2021 CLINICAL DATA:  Stroke follow-up EXAM: MRI HEAD WITHOUT CONTRAST TECHNIQUE: Multiplanar, multiecho pulse sequences of the brain and surrounding structures were obtained without intravenous contrast. COMPARISON:   CT and CTA from yesterday FINDINGS: Brain: Wedges of restricted diffusion spanning the right corona radiata anteriorly and extending from the posterior putamen to the caudate nucleus posteriorly. Lacune with facilitated peripheral diffusion in the periventricular white matter on the right. Extensive chronic small vessel ischemia in the hemispheric white matter with confluent periventricular ischemic gliosis and multiple chronic lacunar infarcts. Small remote right cerebellar infarction. Vascular: Absent right vertebral flow void correlating with prior CTA. Skull and upper cervical spine: Normal marrow signal. Sinuses/Orbits: Negative. IMPRESSION: 1. Acute to subacute perforator infarcts at the right basal ganglia and corona radiata. 2. Extensive chronic small vessel ischemia. Electronically Signed   By: Tiburcio Pea M.D.   On: 02/17/2021 06:38   DG Knee Complete 4 Views Left  Result Date: 02/16/2021 CLINICAL DATA:  Status post fall. EXAM: LEFT KNEE - COMPLETE 4+ VIEW COMPARISON:  None. FINDINGS: No evidence of an acute fracture or dislocation. No evidence of arthropathy or other focal bone abnormality. Moderate to marked severity vascular calcification is seen. A trace joint effusion is seen. IMPRESSION: 1. No acute osseous abnormality. 2. Trace joint effusion. Electronically Signed   By: Aram Candela M.D.   On: 02/16/2021 23:52   DG Knee Complete 4 Views Right  Result Date: 02/16/2021 CLINICAL DATA:  Status post fall. EXAM: RIGHT KNEE - COMPLETE 4+ VIEW COMPARISON:  None. FINDINGS: No evidence of an acute fracture or dislocation. No evidence of arthropathy or other focal bone abnormality. Moderate to marked severity vascular calcification is seen. A very small joint effusion is seen. IMPRESSION: 1. No acute fracture or dislocation. 2. Very small joint effusion. Electronically Signed   By: Aram Candela M.D.   On: 02/16/2021 23:47   ECHOCARDIOGRAM COMPLETE  Result Date: 02/17/2021     ECHOCARDIOGRAM REPORT   Patient Name:   Brent Young Date of Exam: 02/17/2021 Medical Rec #:  [id number]        Height:       73.0 in Accession #:    [id number]       Weight:       275.0 lb Date of Birth:  1960-11-08        BSA:          2.463 m Patient Age:    60 years         BP:           188/110 mmHg Patient Gender: M                HR:           97 bpm. Exam Location:  Inpatient Procedure: 2D Echo, Cardiac Doppler, Color Doppler and Intracardiac            Opacification Agent Indications:    Stroke  History:        Patient has no prior history of Echocardiogram examinations. 2nd                 stroke.  Sonographer:    Roosvelt Maser RDCS Referring Phys: [id number] BRADLEY S CHOTINER IMPRESSIONS  1. Left ventricular ejection fraction, by estimation, is 60 to 65%. The left ventricle has normal function. The left ventricle has  no regional wall motion abnormalities. There is moderate left ventricular hypertrophy. Indeterminate diastolic filling due  to E-A fusion.  2. Right ventricular systolic function is normal. The right ventricular size is normal. There is normal pulmonary artery systolic pressure.  3. Mild mitral valve regurgitation.  4. AV is thickened, calcified. Peak and mean gradients through the valveare 24 and 13 mm Hg respectively AVA (VTI) is 1.87 cm2 Dimensionless index is 0.46 COnsistent with mild AS. Marland Kitchen The aortic valve is tricuspid. Aortic valve regurgitation is not visualized. Aortic valve sclerosis/calcification is present, without any evidence of aortic stenosis. FINDINGS  Left Ventricle: Left ventricular ejection fraction, by estimation, is 60 to 65%. The left ventricle has normal function. The left ventricle has no regional wall motion abnormalities. The left ventricular internal cavity size was normal in size. There is  moderate left ventricular hypertrophy. Indeterminate diastolic filling due to E-A fusion. Right Ventricle: The right ventricular size is normal. Right vetricular wall thickness  was not assessed. Right ventricular systolic function is normal. There is normal pulmonary artery systolic pressure. The tricuspid regurgitant velocity is 2.70 m/s, and with an assumed right atrial pressure of 3 mmHg, the estimated right ventricular systolic pressure is 32.2 mmHg. Left Atrium: Left atrial size was normal in size. Right Atrium: Right atrial size was normal in size. Pericardium: Trivial pericardial effusion is present. Mitral Valve: There is mild thickening of the mitral valve leaflet(s). Mild mitral annular calcification. Mild mitral valve regurgitation. Tricuspid Valve: The tricuspid valve is normal in structure. Tricuspid valve regurgitation is trivial. Aortic Valve: AV is thickened, calcified. Peak and mean gradients through the valveare 24 and 13 mm Hg respectively AVA (VTI) is 1.87 cm2 Dimensionless index is 0.46 COnsistent with mild AS. The aortic valve is tricuspid. Aortic valve regurgitation is not visualized. Aortic valve sclerosis/calcification is present, without any evidence of aortic stenosis. Aortic valve mean gradient measures 12.5 mmHg. Aortic valve peak gradient measures 23.7 mmHg. Aortic valve area, by VTI measures 1.90 cm. Pulmonic Valve: The pulmonic valve was normal in structure. Pulmonic valve regurgitation is not visualized. Aorta: The aortic root and ascending aorta are structurally normal, with no evidence of dilitation. IAS/Shunts: No atrial level shunt detected by color flow Doppler.  LEFT VENTRICLE PLAX 2D LVIDd:         4.90 cm LVIDs:         3.30 cm LV PW:         1.60 cm LV IVS:        1.60 cm LVOT diam:     2.30 cm LV SV:         86 LV SV Index:   35 LVOT Area:     4.15 cm  RIGHT VENTRICLE RV Basal diam:  4.70 cm RV Mid diam:    3.80 cm LEFT ATRIUM              Index        RIGHT ATRIUM           Index LA diam:        4.90 cm  1.99 cm/m   RA Area:     21.70 cm LA Vol (A2C):   124.0 ml 50.34 ml/m  RA Volume:   60.60 ml  24.60 ml/m LA Vol (A4C):   103.0 ml 41.81  ml/m LA Biplane Vol: 114.0 ml 46.28 ml/m  AORTIC VALVE AV Area (Vmax):    1.96 cm AV Area (Vmean):   1.94 cm AV Area (VTI):  1.90 cm AV Vmax:           243.50 cm/s AV Vmean:          164.500 cm/s AV VTI:            0.456 m AV Peak Grad:      23.7 mmHg AV Mean Grad:      12.5 mmHg LVOT Vmax:         115.00 cm/s LVOT Vmean:        76.700 cm/s LVOT VTI:          0.208 m LVOT/AV VTI ratio: 0.46  AORTA Ao Root diam: 3.40 cm Ao Asc diam:  3.30 cm TRICUSPID VALVE TR Peak grad:   29.2 mmHg TR Vmax:        270.00 cm/s  SHUNTS Systemic VTI:  0.21 m Systemic Diam: 2.30 cm Dietrich Pates MD Electronically signed by Dietrich Pates MD Signature Date/Time: 02/17/2021/5:10:24 PM    Final    CT HEAD CODE STROKE WO CONTRAST  Result Date: 02/16/2021 CLINICAL DATA:  Code stroke.  Left-sided weakness EXAM: CT HEAD WITHOUT CONTRAST TECHNIQUE: Contiguous axial images were obtained from the base of the skull through the vertex without intravenous contrast. COMPARISON:  12/26/2020 FINDINGS: Brain: No acute cortical infarction. Periventricular white matter changes, likely the sequela of chronic small vessel ischemic disease, with redemonstrated small vessel infarcts in the bilateral periventricular white matter, basal ganglia, left pons, and right cerebellar hemisphere. No acute hemorrhage, mass mass effect, or midline shift. No extra-axial collection or hydrocephalus. Vascular: No hyperdense vessel or unexpected calcification. Skull: Normal. Negative for fracture or focal lesion. Sinuses/Orbits: No acute finding. Other: The mastoid air cells are well aerated. ASPECTS [facility] Stroke Program Early CT Score) - Ganglionic level infarction (caudate, lentiform nuclei, internal capsule, insula, M1-M3 cortex): 7 - Supraganglionic infarction (M4-M6 cortex): 3 Total score (0-10 with 10 being normal): 10 IMPRESSION: 1. No acute intracranial process. 2. ASPECTS is 10 Code stroke imaging results were communicated on 02/16/2021 at 8:27 pm to  provider Dr. Derry Lory via secure text paging. Electronically Signed   By: Wiliam Ke M.D.   On: 02/16/2021 20:29    Labs:  CBC: Recent Labs    04/16/20 0954 02/16/21 2013 02/16/21 2100 02/18/21 0353  WBC 7.5  --  12.4* 9.0  HGB 16.9 19.4* 16.8 15.6  HCT 51.5* 57.0* 49.8 47.7  PLT 239  --  341 334    COAGS: Recent Labs    02/16/21 2100 02/18/21 0353  INR 1.2 1.2  APTT 32  --     BMP: Recent Labs    04/16/20 0954 02/16/21 2013 02/16/21 2100 02/18/21 0353 02/19/21 0043  NA 136 141 137 141 138  K 3.9 3.1* 3.1* 2.9* 3.6  CL 91* 98 98 103 100  CO2 23  --  27 28 29   GLUCOSE 331* 187* 181* 124* 229*  BUN 10 7* 7* 7* 14  CALCIUM 9.7  --  9.2 8.8* 8.9  CREATININE 0.80 0.70 0.88 0.71 1.02  GFRNONAA 97  --  >60 >60 >60  GFRAA 112  --   --   --   --     LIVER FUNCTION TESTS: Recent Labs    04/16/20 0954 02/16/21 2100  BILITOT 1.4* 1.5*  AST 30 25  ALT 26 17  ALKPHOS 176* 99  PROT 8.6* 7.8  ALBUMIN 4.7 3.8    Assessment and Plan:  Code Stroke s/p diagnostic cerebral angiogram with findings of severe right vertebral  artery stenosis; 65-70% left ICA stenosis, mild-moderate stenosis of bilateral ICA siphons   Patient is awake, alert and without pain or discomfort. Right femoral vascular access site it clean, soft, dry and non-tender. Patient receiving Plavix and Aspirin per neurology; pending plans for inpatient rehab.  IR remains available as needed. Please call IR with any questions.   Electronically Signed: Alwyn Ren, AGACNP-BC [phone] 02/20/2021, 9:51 AM   I spent a total of 15 Minutes at the the patient's bedside AND on the patient's hospital floor or unit, greater than 50% of which was counseling/coordinating care for diagnostic cerebral angiogram follow up.

## 2021-02-20 NOTE — Progress Notes (Signed)
PROGRESS NOTE  DIYARI CHERNE XFG:[id number] DOB: 1961-02-01 DOA: 02/16/2021 PCP: Kerin Perna, NP   LOS: 3 days   Brief Narrative / Interim history: 60 year old male with prior history of lacunar CVAs, arthritis, DM2, HTN, obesity class II, who comes to the hospital on 11/27 as a code stroke with left arm and left leg weakness.  This happened upon waking up, last known well was at 10 PM the night prior.  Neurology evaluated in the ED, an MRI demonstrated acute-subacute infarcts in the right basal ganglia and corona radiata on background of extensive chronic small vessel ischemia.  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  Neuro interventional IR was also consulted and underwent cerebral angiogram  Subjective / 24h Interval events: No complaints, eating breakfast.  Feels a little bit stronger.  Assessment & Plan: Principal Problem:   Stroke [facility]) Active Problems:   Essential hypertension   Diabetes mellitus type 2 in obese (HCC)   Elevated troponin   Prolonged QT interval   Shoulder pain, right   Demand ischemia (HCC)   Hyperlipidemia, unspecified   Hypokalemia   Leukocytosis   Obesity, Class II, BMI 35-39.9  Principal Problem Recurrent acute to subacute perforator infarcts at the right basal ganglia and corona radiata due to right ICA siphon severe stenosis -neurology and interventional radiology was consulted.  CT angiogram showed right V4 likely occluded proximal to the takeoff of the right PICA with no definitive flow seen in the right PICA which is new from prior exam.  He also has significant extensive intracranial atherosclerosis.  He underwent a 2D echocardiogram showed an EF of 60-65%.  LDL was 98 and he is on atorvastatin, dose increased from 10 to 40 mg daily.  A1c was 9.9.  Urine drug screen was  positive for THC.  Cerebral angiogram showed left ICA 65-70% stenosis, bilateral ICAs-mild to moderate stenosis, right VA severe stenosis.  Neurology recommends aspirin 325 and Plavix 75 DAPT for 3 months then aspirin alone  Active Problems Essential hypertension-allow permissive hypertension for now but BP goal for him is 130-160 given severe intravascular stenosis.  We will start resuming part of his home regimen tomorrow, probably start at lower doses  Right shoulder pain - Consistent with mild AC joint arthritis possibly from falling and overuse. XR without fracture or dislocation.  Supportive management  Tobacco use - Cessation counseling provided   Demand myocardial ischemia -high-sensitivity troponin flat, not in a pattern consistent with ACS.  No chest pain   Hypokalemia -continue to monitor  Hyperlipidemia-continue atorvastatin 40 mg  Leukocytosis - Suspect reactive to CVA. Resolved without abx.    T2DM - HbA1c 9.9% - SSI. Continue home linagliptin  CBG (last 3)  Recent Labs    02/19/21 1535 02/19/21 2118 02/20/21 0645  GLUCAP 138* 209* 231*     Obesity, class II-he would benefit from weight loss.  BMI 36  Scheduled Meds:   stroke: mapping our early stages of recovery book   Does not apply Once   acetaminophen  1,000 mg Oral Once   aspirin EC  325 mg Oral Daily   atorvastatin  40 mg Oral Daily   clopidogrel  75 mg Oral Daily   enoxaparin (LOVENOX) injection  40 mg Subcutaneous Q24H   gabapentin  300 mg Oral TID   insulin aspart  0-9 Units Subcutaneous TID WC   insulin starter kit- pen needles  1 kit Other Once   linagliptin  5 mg Oral Daily   living well with diabetes book   Does not apply Once   Oxcarbazepine  300 mg Oral TID   Continuous Infusions: PRN Meds:.acetaminophen **OR** acetaminophen (TYLENOL) oral liquid 160 mg/5 mL **OR** acetaminophen, hydrALAZINE, ketorolac  Diet Orders (From admission, onward)     Start     Ordered   02/19/21 1528  Diet  regular Room service appropriate? Yes; Fluid consistency: Thin  Diet effective now       Question Answer Comment  Room service appropriate? Yes   Fluid consistency: Thin      02/19/21 1527            DVT prophylaxis: enoxaparin (LOVENOX) injection 40 mg Start: 02/18/21 1545 SCD's Start: 02/17/21 0227     Code Status: Full Code  Family Communication: no family at bedside   Status is: Inpatient  Remains inpatient appropriate because: Awaiting CIR bed  Level of care: Telemetry Cardiac  Consultants:  Neurology, neuroIR  Procedures:  2D echo  Microbiology  none  Antimicrobials: none    Objective: Vitals:   02/20/21 0000 02/20/21 0405 02/20/21 0733 02/20/21 1100  BP: 139/90 129/83 (!) 168/110 (!) 174/84  Pulse: 94 100 99 99  Resp: 18 20 20 20   Temp: 97.9 F (36.6 C) 98.5 F (36.9 C) 98 F (36.7 C) 98.1 F (36.7 C)  TempSrc: Axillary Oral Oral Oral  SpO2: 98% 97% 94% 96%  Weight:      Height:        Intake/Output Summary (Last 24 hours) at 02/20/2021 1127 Last data filed at 02/20/2021 0741 Gross per 24 hour  Intake 240 ml  Output 1000 ml  Net -760 ml    Filed Weights   02/16/21 2106  Weight: 124.7 kg    Examination:  Constitutional: No distress Eyes: No scleral icterus ENMT: Moist mucous membranes Neck: normal, supple Respiratory: Lungs are clear bilaterally, no wheezing, no crackles, normal respiratory effort Cardiovascular: Regular rate and rhythm, no murmurs, no peripheral edema Abdomen: Soft, NT, ND, bowel sounds positive Musculoskeletal: no clubbing / cyanosis.  Skin: No rashes seen Neurologic: CN 2-12 grossly intact.  LUE, LLE significant weakness, good strength on right, no new focal deficits   Data Reviewed: I have independently reviewed following labs and imaging studies   CBC: Recent Labs  Lab 02/16/21 2013 02/16/21 2100 02/18/21 0353  WBC  --  12.4* 9.0  NEUTROABS  --  8.4* 5.0  HGB 19.4* 16.8 15.6  HCT 57.0* 49.8 47.7   MCV  --  91.5 91.7  PLT  --  341 450    Basic Metabolic Panel: Recent Labs  Lab 02/16/21 2013 02/16/21 2100 02/18/21 0353 02/19/21 0043  NA 141 137 141 138  K 3.1* 3.1* 2.9* 3.6  CL 98 98 103 100  CO2  --  27 28 29   GLUCOSE 187* 181* 124* 229*  BUN 7* 7* 7* 14  CREATININE 0.70 0.88 0.71 1.02  CALCIUM  --  9.2 8.8* 8.9  MG  --  1.8  --  2.0    Liver Function Tests: Recent Labs  Lab 02/16/21 2100  AST 25  ALT 17  ALKPHOS 99  BILITOT 1.5*  PROT 7.8  ALBUMIN 3.8    Coagulation Profile: Recent Labs  Lab 02/16/21 2100 02/18/21 0353  INR 1.2 1.2    HbA1C: No results for input(s):  HGBA1C in the last 72 hours.  CBG: Recent Labs  Lab 02/19/21 0858 02/19/21 1115 02/19/21 1535 02/19/21 2118 02/20/21 0645  GLUCAP 226* 200* 138* 209* 231*     Recent Results (from the past 240 hour(s))  Resp Panel by RT-PCR (Flu A&B, Covid) Nasopharyngeal Swab     Status: None   Collection Time: 02/16/21  8:08 PM   Specimen: Nasopharyngeal Swab; Nasopharyngeal(NP) swabs in vial transport medium  Result Value Ref Range Status   SARS Coronavirus 2 by RT PCR NEGATIVE NEGATIVE Final    Comment: (NOTE) SARS-CoV-2 target nucleic acids are NOT DETECTED.  The SARS-CoV-2 RNA is generally detectable in upper respiratory specimens during the acute phase of infection. The lowest concentration of SARS-CoV-2 viral copies this assay can detect is 138 copies/mL. A negative result does not preclude SARS-Cov-2 infection and should not be used as the sole basis for treatment or other patient management decisions. A negative result may occur with  improper specimen collection/handling, submission of specimen other than nasopharyngeal swab, presence of viral mutation(s) within the areas targeted by this assay, and inadequate number of viral copies(<138 copies/mL). A negative result must be combined with clinical observations, patient history, and epidemiological information. The expected  result is Negative.  Fact Sheet for Patients:  EntrepreneurPulse.com.au  Fact Sheet for Healthcare Providers:  IncredibleEmployment.be  This test is no t yet approved or cleared by the [place] FDA and  has been authorized for detection and/or diagnosis of SARS-CoV-2 by FDA under an Emergency Use Authorization (EUA). This EUA will remain  in effect (meaning this test can be used) for the duration of the COVID-19 declaration under Section 564(b)(1) of the Act, 21 U.S.C.section 360bbb-3(b)(1), unless the authorization is terminated  or revoked sooner.       Influenza A by PCR NEGATIVE NEGATIVE Final   Influenza B by PCR NEGATIVE NEGATIVE Final    Comment: (NOTE) The Xpert Xpress SARS-CoV-2/FLU/RSV plus assay is intended as an aid in the diagnosis of influenza from Nasopharyngeal swab specimens and should not be used as a sole basis for treatment. Nasal washings and aspirates are unacceptable for Xpert Xpress SARS-CoV-2/FLU/RSV testing.  Fact Sheet for Patients: EntrepreneurPulse.com.au  Fact Sheet for Healthcare Providers: IncredibleEmployment.be  This test is not yet approved or cleared by the [place] FDA and has been authorized for detection and/or diagnosis of SARS-CoV-2 by FDA under an Emergency Use Authorization (EUA). This EUA will remain in effect (meaning this test can be used) for the duration of the COVID-19 declaration under Section 564(b)(1) of the Act, 21 U.S.C. section 360bbb-3(b)(1), unless the authorization is terminated or revoked.  Performed at [facility], [place] [address] [place], [place] [address]       Radiology Studies: No results found.  Marzetta Board, MD, PhD [facility]  Between 7 am - 7 pm I am available, please contact me via Amion (for emergencies) or Securechat (non urgent messages)  Between 7 pm - 7 am I am not available, please contact  night coverage MD/APP via Amion

## 2021-02-20 NOTE — Progress Notes (Signed)
Inpatient Rehab Admissions Coordinator:   Met with patient at the bedside to discuss CIR goals and expectations.  I also spoke to his son, Brent Young, over the phone to discuss same.  I shared that CIR would be about a 2 week program, with estimated goals of min-mod assist for mobility and ADLs.  Per Brent Young, family will be able to provide this level of care at home after CIR.  Pt shares that he does have Brent Young, which Bronson Curb, RN CM, has confirmed, and I have asked to be added to his account.  I will need prior auth for CIR from [facility]  We also discussed need to continue aggressively with acute PT, as the better he is when he starts CIR, the better off he will be at d/c from CIR.  Pt and son verbalized understanding.  I will start [name] prior auth request tomorrow.   Shann Medal, PT, DPT Admissions Coordinator [phone] 02/20/21  4:51 PM

## 2021-02-20 NOTE — Progress Notes (Signed)
Inpatient Diabetes Program Recommendations  AACE/ADA: New Consensus Statement on Inpatient Glycemic Control (2015)  Target Ranges:  Prepandial:   less than 140 mg/dL      Peak postprandial:   less than 180 mg/dL (1-2 hours)      Critically ill patients:  140 - 180 mg/dL   Lab Results  Component Value Date   GLUCAP 267 (H) 02/20/2021   HGBA1C 9.9 (H) 02/17/2021    Review of Glycemic Control  Latest Reference Range & Units 02/19/21 21:18 02/20/21 06:45 02/20/21 12:06  Glucose-Capillary 70 - 99 mg/dL 209 (H) 231 (H) 267 (H)  (H): Data is abnormally high  Diabetes history: DM2 Outpatient Diabetes medications: Amaryl 4 mg QD, Metformin  1000 mg BID, Januvia 50 mg QD Current orders for Inpatient glycemic control: Novolog 0-9 units TID and TRagjenta 5 mg QD  Inpatient Diabetes Program Recommendations:    Semglee 12 units QD  Spoke with patient at bedside.  He confirms home DM medications.  Reviewed patient's current A1c of 9.9% (average blood sugar of 237 mg/dL). Explained what a A1c is and what it measures. Also reviewed goal A1c with patient, importance of good glucose control @ home, and blood sugar goals.    He is tearful and states he is determined to get well.  He states he is turning over a new leaf.  Explained he will likely need insulin at discharge and he is open to that.  He states his PCP had been trying to put him on insulin in the past but he refused.  He lives with several family members and his mother and sister have DM2 and are both on insulin.  He is aware of how to use the insulin pen.  We discussed hypoglycemia and treatment.  Ordered LWWD and insulin pen starter kit.    Our team will follow him as he states he will be going to CIR.    Will continue to follow while inpatient.  Thank you, Reche Dixon, RN, BSN Diabetes Coordinator Inpatient Diabetes Program [phone] (team pager from 8a-5p)

## 2021-02-21 ENCOUNTER — Inpatient Hospital Stay (HOSPITAL_COMMUNITY): Payer: Medicaid Other

## 2021-02-21 LAB — GLUCOSE, CAPILLARY
Glucose-Capillary: 309 mg/dL — ABNORMAL HIGH (ref 70–99)
Glucose-Capillary: 206 mg/dL — ABNORMAL HIGH (ref 70–99)
Glucose-Capillary: 222 mg/dL — ABNORMAL HIGH (ref 70–99)
Glucose-Capillary: 269 mg/dL — ABNORMAL HIGH (ref 70–99)

## 2021-02-21 LAB — BASIC METABOLIC PANEL
Anion gap: 6 (ref 5–15)
BUN: 15 mg/dL (ref 8–23)
CO2: 30 mmol/L (ref 22–32)
Calcium: 8.9 mg/dL (ref 8.9–10.3)
Chloride: 100 mmol/L (ref 98–111)
Creatinine, Ser: 0.81 mg/dL (ref 0.61–1.24)
GFR, Estimated: >60 mL/min (ref >60)
Glucose, Bld: 213 mg/dL — ABNORMAL HIGH (ref 70–99)
Potassium: 3.8 mmol/L (ref 3.5–5.1)
Sodium: 136 mmol/L (ref 135–145)

## 2021-02-21 LAB — CBC
HCT: 41.9 % (ref 39.0–52.0)
Hemoglobin: 13.7 g/dL (ref 13.0–17.0)
MCH: 30.7 pg (ref 26.0–34.0)
MCHC: 32.7 g/dL (ref 30.0–36.0)
MCV: 93.9 fL (ref 80.0–100.0)
Platelets: 254 10*3/uL (ref 150–400)
RBC: 4.46 MIL/uL (ref 4.22–5.81)
RDW: 16.1 % — ABNORMAL HIGH (ref 11.5–15.5)
WBC: 9 10*3/uL (ref 4.0–10.5)
nRBC: 0 % (ref 0.0–0.2)

## 2021-02-21 MED ORDER — ORAL CARE MOUTH RINSE
15.0000 mL | Freq: Two times a day (BID) | OROMUCOSAL | Status: DC
  Administered 2021-02-21 – 2021-02-27 (×9): 15 mL via OROMUCOSAL

## 2021-02-21 MED ORDER — METOPROLOL SUCCINATE ER 25 MG PO TB24
25.0000 mg | ORAL_TABLET | Freq: Every day | ORAL | Status: DC
  Administered 2021-02-21 – 2021-02-27 (×7): 25 mg via ORAL
  Filled 2021-02-21 (×7): qty 1

## 2021-02-21 MED ORDER — AMLODIPINE BESYLATE 10 MG PO TABS
10.0000 mg | ORAL_TABLET | Freq: Every day | ORAL | Status: DC
  Administered 2021-02-21 – 2021-02-27 (×7): 10 mg via ORAL
  Filled 2021-02-21 (×8): qty 1

## 2021-02-21 MED ORDER — FUROSEMIDE 10 MG/ML IJ SOLN
40.0000 mg | Freq: Once | INTRAMUSCULAR | Status: AC
  Administered 2021-02-21: 40 mg via INTRAVENOUS
  Filled 2021-02-21: qty 4

## 2021-02-21 MED ORDER — IRBESARTAN 150 MG PO TABS
75.0000 mg | ORAL_TABLET | Freq: Every day | ORAL | Status: DC
  Administered 2021-02-21 – 2021-02-27 (×7): 75 mg via ORAL
  Filled 2021-02-21 (×8): qty 1

## 2021-02-21 NOTE — Progress Notes (Signed)
Inpatient Rehab Admissions Coordinator:   Notified by pre-service center that pt's [facility] appears to have termed.  I spoke to patient's son, [name], over the phone and he states he will call to figure out why and if it can be re-instated.  Will not have a bed available for this patient over the weekend and will f/u on Monday.   Estill Dooms, PT, DPT Admissions Coordinator [phone] 02/21/21  2:20 PM

## 2021-02-21 NOTE — Progress Notes (Signed)
PROGRESS NOTE  Brent Young EXN:[id number] DOB: [birth year] DOA: 02/16/2021 PCP: Kerin Perna, NP   LOS: 4 days   Brief Narrative / Interim history: 60 year old male with prior history of lacunar CVAs, arthritis, DM2, HTN, obesity class II, who comes to the hospital on 11/27 as a code stroke with left arm and left leg weakness.  This happened upon waking up, last known well was at 10 PM the night prior.  Neurology evaluated in the ED, an MRI demonstrated acute-subacute infarcts in the right basal ganglia and corona radiata on background of extensive chronic small vessel ischemia.  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  Neuro interventional IR was also consulted and underwent cerebral angiogram  Subjective / 24h Interval events: Complains of slight shortness of breath this morning.  No chest pain.  No abdominal pain, no nausea or vomiting.  Assessment & Plan: Principal Problem:   Stroke [facility]) Active Problems:   Essential hypertension   Diabetes mellitus type 2 in obese (HCC)   Elevated troponin   Prolonged QT interval   Shoulder pain, right   Demand ischemia (HCC)   Hyperlipidemia, unspecified   Hypokalemia   Leukocytosis   Obesity, Class II, BMI 35-39.9  Principal Problem Recurrent acute to subacute perforator infarcts at the right basal ganglia and corona radiata due to right ICA siphon severe stenosis -neurology and interventional radiology was consulted.  CT angiogram showed right V4 likely occluded proximal to the takeoff of the right PICA with no definitive flow seen in the right PICA which is new from prior exam.  He also has significant extensive intracranial atherosclerosis.  He underwent a 2D echocardiogram showed an EF of 60-65%.  LDL was 98 and he is on atorvastatin, dose increased from 10 to 40  mg daily.  A1c was 9.9.  Urine drug screen was positive for THC.  Cerebral angiogram showed left ICA 65-70% stenosis, bilateral ICAs-mild to moderate stenosis, right VA severe stenosis.  Neurology recommends aspirin 325 and Plavix 75 DAPT for 3 months then aspirin alone.  Awaiting CIR placement  Active Problems Essential hypertension-allow permissive hypertension for now but BP goal for him is 130-160 given severe intravascular stenosis.  Continue amlodipine, irbesartan, persistently hypertensive and add home metoprolol at a lower dose and monitor  Dyspnea-faint crackles bibasilarly, looks like he is on 2 L, will give Lasix x1.  Obtain chest x-ray  Right shoulder pain - Consistent with mild AC joint arthritis possibly from falling and overuse. XR without fracture or dislocation.  Supportive management  Tobacco use - Cessation counseling provided   Demand myocardial ischemia -high-sensitivity troponin flat, not in a pattern consistent with ACS.  No chest pain   Hypokalemia -continue to monitor  Hyperlipidemia-continue atorvastatin 40 mg  Leukocytosis - Suspect reactive to CVA. Resolved without abx.    T2DM - HbA1c 9.9% - SSI. Continue home linagliptin  CBG (last 3)  Recent Labs    02/20/21 1633 02/20/21 2050 02/21/21 0800  GLUCAP 237* 309* 206*     Obesity, class II-he would benefit from weight loss.  BMI 36  Scheduled Meds:   stroke: mapping our early stages of recovery book   Does not apply Once   acetaminophen  1,000 mg Oral Once   amLODipine  10 mg Oral Daily   aspirin EC  325 mg  Oral Daily   atorvastatin  40 mg Oral Daily   clopidogrel  75 mg Oral Daily   enoxaparin (LOVENOX) injection  40 mg Subcutaneous Q24H   gabapentin  300 mg Oral TID   insulin aspart  0-9 Units Subcutaneous TID WC   insulin starter kit- pen needles  1 kit Other Once   irbesartan  75 mg Oral Daily   linagliptin  5 mg Oral Daily   living well with diabetes book   Does not apply Once   mouth rinse   15 mL Mouth Rinse BID   Oxcarbazepine  300 mg Oral TID   Continuous Infusions: PRN Meds:.acetaminophen **OR** acetaminophen (TYLENOL) oral liquid 160 mg/5 mL **OR** acetaminophen, hydrALAZINE, ketorolac  Diet Orders (From admission, onward)     Start     Ordered   02/19/21 1528  Diet regular Room service appropriate? Yes; Fluid consistency: Thin  Diet effective now       Question Answer Comment  Room service appropriate? Yes   Fluid consistency: Thin      02/19/21 1527            DVT prophylaxis: enoxaparin (LOVENOX) injection 40 mg Start: 02/18/21 1545 SCD's Start: 02/17/21 0227     Code Status: Full Code  Family Communication: no family at bedside   Status is: Inpatient  Remains inpatient appropriate because: Awaiting CIR bed  Level of care: Telemetry Cardiac  Consultants:  Neurology, neuroIR  Procedures:  2D echo  Microbiology  none  Antimicrobials: none    Objective: Vitals:   02/21/21 0320 02/21/21 0538 02/21/21 0644 02/21/21 0804  BP: (!) 183/104 (!) 176/99 (!) 197/110 (!) 174/98  Pulse: 92   95  Resp: _0 Temp: 97.8 F (36.6 C)   98.3 F (36.8 C)  TempSrc: Oral   Oral  SpO2: 96% 94%  98%  Weight:      Height:        Intake/Output Summary (Last 24 hours) at 02/21/2021 2707 Last data filed at 02/21/2021 0700 Gross per 24 hour  Intake 240 ml  Output 850 ml  Net -610 ml    Filed Weights   02/16/21 2106  Weight: 124.7 kg    Examination:  Constitutional: NAD Eyes: Anicteric ENMT: Moist mucous membranes Neck: normal, supple Respiratory: Faint bibasilar crackles, no wheezing, normal respiratory effort Cardiovascular: Regular rate and rhythm, no murmurs, no edema Abdomen: Soft, NT, ND, bowel sounds positive Musculoskeletal: no clubbing / cyanosis.  Skin: No rashes seen Neurologic: CN 2-12 grossly intact.  LUE, LLE significant weakness, good strength on right, no new focal deficits   Data Reviewed: I have independently  reviewed following labs and imaging studies   CBC: Recent Labs  Lab 02/16/21 2013 02/16/21 2100 02/18/21 0353 02/21/21 0354  WBC  --  12.4* 9.0 9.0  NEUTROABS  --  8.4* 5.0  --   HGB 19.4* 16.8 15.6 13.7  HCT 57.0* 49.8 47.7 41.9  MCV  --  91.5 91.7 93.9  PLT  --  341 334 867    Basic Metabolic Panel: Recent Labs  Lab 02/16/21 2013 02/16/21 2100 02/18/21 0353 02/19/21 0043 02/21/21 0354  NA 141 137 141 138 136  K 3.1* 3.1* 2.9* 3.6 3.8  CL 98 98 103 100 100  CO2  --  _1 GLUCOSE 187* 181* 124* 229* 213*  BUN 7* 7* 7* 14 15  CREATININE 0.70 0.88 0.71 1.02 0.81  CALCIUM  --  9.2  8.8* 8.9 8.9  MG  --  1.8  --  2.0  --     Liver Function Tests: Recent Labs  Lab 02/16/21 2100  AST 25  ALT 17  ALKPHOS 99  BILITOT 1.5*  PROT 7.8  ALBUMIN 3.8    Coagulation Profile: Recent Labs  Lab 02/16/21 2100 02/18/21 0353  INR 1.2 1.2    HbA1C: No results for input(s): HGBA1C in the last 72 hours.  CBG: Recent Labs  Lab 02/20/21 0645 02/20/21 1206 02/20/21 1633 02/20/21 2050 02/21/21 0800  GLUCAP 231* 267* 237* 309* 206*     Recent Results (from the past 240 hour(s))  Resp Panel by RT-PCR (Flu A&B, Covid) Nasopharyngeal Swab     Status: None   Collection Time: 02/16/21  8:08 PM   Specimen: Nasopharyngeal Swab; Nasopharyngeal(NP) swabs in vial transport medium  Result Value Ref Range Status   SARS Coronavirus 2 by RT PCR NEGATIVE NEGATIVE Final    Comment: (NOTE) SARS-CoV-2 target nucleic acids are NOT DETECTED.  The SARS-CoV-2 RNA is generally detectable in upper respiratory specimens during the acute phase of infection. The lowest concentration of SARS-CoV-2 viral copies this assay can detect is 138 copies/mL. A negative result does not preclude SARS-Cov-2 infection and should not be used as the sole basis for treatment or other patient management decisions. A negative result may occur with  improper specimen collection/handling, submission  of specimen other than nasopharyngeal swab, presence of viral mutation(s) within the areas targeted by this assay, and inadequate number of viral copies(<138 copies/mL). A negative result must be combined with clinical observations, patient history, and epidemiological information. The expected result is Negative.  Fact Sheet for Patients:  EntrepreneurPulse.com.au  Fact Sheet for Healthcare Providers:  IncredibleEmployment.be  This test is no t yet approved or cleared by the [place] FDA and  has been authorized for detection and/or diagnosis of SARS-CoV-2 by FDA under an Emergency Use Authorization (EUA). This EUA will remain  in effect (meaning this test can be used) for the duration of the COVID-19 declaration under Section 564(b)(1) of the Act, 21 U.S.C.section 360bbb-3(b)(1), unless the authorization is terminated  or revoked sooner.       Influenza A by PCR NEGATIVE NEGATIVE Final   Influenza B by PCR NEGATIVE NEGATIVE Final    Comment: (NOTE) The Xpert Xpress SARS-CoV-2/FLU/RSV plus assay is intended as an aid in the diagnosis of influenza from Nasopharyngeal swab specimens and should not be used as a sole basis for treatment. Nasal washings and aspirates are unacceptable for Xpert Xpress SARS-CoV-2/FLU/RSV testing.  Fact Sheet for Patients: EntrepreneurPulse.com.au  Fact Sheet for Healthcare Providers: IncredibleEmployment.be  This test is not yet approved or cleared by the [place] FDA and has been authorized for detection and/or diagnosis of SARS-CoV-2 by FDA under an Emergency Use Authorization (EUA). This EUA will remain in effect (meaning this test can be used) for the duration of the COVID-19 declaration under Section 564(b)(1) of the Act, 21 U.S.C. section 360bbb-3(b)(1), unless the authorization is terminated or revoked.  Performed at [facility], [place] [address]  [place],  [address]       Radiology Studies: No results found.  Marzetta Board, MD, PhD [facility]  Between 7 am - 7 pm I am available, please contact me via Amion (for emergencies) or Securechat (non urgent messages)  Between 7 pm - 7 am I am not available, please contact night coverage MD/APP via Amion

## 2021-02-21 NOTE — Progress Notes (Signed)
Orthopedic Tech Progress Note Patient Details:  Brent Young Jul 31, 1960 [id number]  Ortho Devices Type of Ortho Device: Prafo boot/shoe Ortho Device/Splint Interventions: Ordered      Brent Young 02/21/2021, 3:58 PM

## 2021-02-21 NOTE — Progress Notes (Signed)
   02/20/21 2125  Assess: MEWS Score  BP (!) 210/94  ECG Heart Rate (!) 105  Resp 20  Level of Consciousness Alert  SpO2 90 %  O2 Device Room Air  Assess: MEWS Score  MEWS Temp 0  MEWS Systolic 2  MEWS Pulse 1  MEWS RR 0  MEWS LOC 0  MEWS Score 3  MEWS Score Color Yellow  Assess: if the MEWS score is Yellow or Red  Were vital signs taken at a resting state? Yes  Focused Assessment No change from prior assessment  Early Detection of Sepsis Score *See Row Information* Low  MEWS guidelines implemented *See Row Information* Yes  Treat  MEWS Interventions Administered prn meds/treatments;Escalated (See documentation below)  Pain Scale 0-10  Pain Score 8  Pain Type Acute pain  Pain Location Shoulder  Pain Orientation Right  Pain Descriptors / Indicators Aching  Pain Frequency Constant  Pain Onset On-going  Pain Intervention(s) Medication (See eMAR);Repositioned  Multiple Pain Sites No  Take Vital Signs  Increase Vital Sign Frequency  Yellow: Q 2hr X 2 then Q 4hr X 2, if remains yellow, continue Q 4hrs  Escalate  MEWS: Escalate Yellow: discuss with charge nurse/RN and consider discussing with provider and RRT  Notify: Charge Nurse/RN  Name of Charge Nurse/RN Notified Lurena Joiner RN  Date Charge Nurse/RN Notified 02/20/21  Notify: Provider  Provider Name/Title Dr. Rachael Darby  Date Provider Notified 02/20/21  Time Provider Notified 2132  Notification Type  (secure chat)  Notification Reason Other (Comment) (BP 194/108, recheck 210/94. then 180/92 other arm. HR 100s, ST but had 6 sec run of SVT vs afib per tele.)  Provider response See new orders (IV metoprolol ordered)  Date of Provider Response 02/20/21  Time of Provider Response 2134  Document  Patient Outcome Stabilized after interventions (HR improved, BP slowly improving)

## 2021-02-21 NOTE — Progress Notes (Signed)
Physical Therapy Treatment Patient Details Name: Brent Young MRN: [id number] DOB: [birth year] Today's Date: 02/21/2021   History of Present Illness Pt is a 60 y/o male presenting w/ LUE and LLE weakness. 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 PMH significant for lacunar CVA's, DM II, HTN.    PT Comments    Pt progressing towards PT goals. Requires assistance with moving his L side through its range of motion as it's beginning to stiffen; c/o knee hurting and feeling tight during PROM. Ordered and received L PRAFO boot to help maintain ankle DF. Exercises performed in supine on the R side to encourage maintaining ROM. MaxiMove used to transfer from [employer]. Pt will continue to benefit from skilled PT to increase independence and safety with mobility. Will continue to follow.   Recommendations for follow up therapy are one component of a multi-disciplinary discharge planning process, led by the attending physician.  Recommendations may be updated based on patient status, additional functional criteria and insurance authorization.  Follow Up Recommendations  Acute inpatient rehab (3hours/day)     Assistance Recommended at Discharge    Equipment Recommendations  Wheelchair (measurements PT);Wheelchair cushion (measurements PT)    Recommendations for Other Services Rehab consult     Precautions / Restrictions Precautions Precautions: Fall Precaution Comments: Dense L side Restrictions Weight Bearing Restrictions: No     Mobility  Bed Mobility Overal bed mobility: Needs Assistance Bed Mobility: Rolling Rolling: Max assist         General bed mobility comments: Pt rolled with bed flat. Able to help initiate roll, but needs assistance w/ following through and staying in sidelying. Max assist rolling to both sides, R side requires more assistance. Bed pad utilized to complete rolls to both sides.    Transfers Overall transfer level: Needs  assistance                 General transfer comment: Tranferred pt from bed>chair w/ MaxiMove Transfer via Lift Equipment: Maximove  Ambulation/Gait                   Stairs             Wheelchair Mobility    Modified Rankin (Stroke Patients Only) Modified Rankin (Stroke Patients Only) Pre-Morbid Rankin Score: No significant disability Modified Rankin: Severe disability     Balance Overall balance assessment: Needs assistance Sitting-balance support: Feet supported Sitting balance-Leahy Scale: Poor Sitting balance - Comments: In recliner, L lateral LOB with trunk requiring assist to recover. Propped pillow behind L side.                                    Cognition Arousal/Alertness: Awake/alert Behavior During Therapy: WFL for tasks assessed/performed Overall Cognitive Status: Within Functional Limits for tasks assessed                                          Exercises General Exercises - Upper Extremity Elbow Flexion: 5 reps;AAROM (HOB elevated) Elbow Extension: 5 reps;AAROM (HOB elevated) Wrist Extension:  (HOB elevated) General Exercises - Lower Extremity Ankle Circles/Pumps: AROM;Strengthening;Right;10 reps;Supine Gluteal Sets: AROM;Strengthening;Both;10 reps;Supine Heel Slides: AROM;Right;10 reps;Supine Straight Leg Raises: AROM;Strengthening;Right;10 reps;Supine Other Exercises Other Exercises: PROM of L ankle in supine into DF/PF (HOB elevated) Other Exercises: PROM of L  knee in supine into flexion and extension (HOB elevated) Other Exercises: PROM of L hip in supine into flexion    General Comments        Pertinent Vitals/Pain Pain Assessment: No/denies pain    Home Living                          Prior Function            PT Goals (current goals can now be found in the care plan section) Acute Rehab PT Goals Patient Stated Goal: Today, goal was to be up in the chair. Motivated for  rehab. PT Goal Formulation: With patient Time For Goal Achievement: 03/04/21 Potential to Achieve Goals: Good Progress towards PT goals: Progressing toward goals    Frequency    Min 4X/week      PT Plan Current plan remains appropriate    Co-evaluation              AM-PAC PT "6 Clicks" Mobility   Outcome Measure  Help needed turning from your back to your side while in a flat bed without using bedrails?: A Lot Help needed moving from lying on your back to sitting on the side of a flat bed without using bedrails?: Total Help needed moving to and from a bed to a chair (including a wheelchair)?: Total Help needed standing up from a chair using your arms (e.g., wheelchair or bedside chair)?: Total Help needed to walk in hospital room?: Total Help needed climbing 3-5 steps with a railing? : Total 6 Click Score: 7    End of Session   Activity Tolerance: Patient tolerated treatment well Patient left: in chair;with call bell/phone within reach;with chair alarm set Nurse Communication: Mobility status;Need for lift equipment PT Visit Diagnosis: Unsteadiness on feet (R26.81);Hemiplegia and hemiparesis Hemiplegia - Right/Left: Left Hemiplegia - dominant/non-dominant: Non-dominant Hemiplegia - caused by: Cerebral infarction     Time: 9924-2683 PT Time Calculation (min) (ACUTE ONLY): 50 min  Charges:  $Therapeutic Exercise: 8-22 mins $Therapeutic Activity: 23-37 mins                     Janyce Llanos, SPT Acute Rehabilitation Services Pager: [phone] Office: [phone]    Sumayya Muha 02/21/2021, 3:47 PM

## 2021-02-22 LAB — BASIC METABOLIC PANEL
Anion gap: 10 (ref 5–15)
BUN: 11 mg/dL (ref 8–23)
CO2: 31 mmol/L (ref 22–32)
Calcium: 9.3 mg/dL (ref 8.9–10.3)
Chloride: 96 mmol/L — ABNORMAL LOW (ref 98–111)
Creatinine, Ser: 0.63 mg/dL (ref 0.61–1.24)
GFR, Estimated: >60 mL/min (ref >60)
Glucose, Bld: 178 mg/dL — ABNORMAL HIGH (ref 70–99)
Potassium: 3.9 mmol/L (ref 3.5–5.1)
Sodium: 137 mmol/L (ref 135–145)

## 2021-02-22 LAB — GLUCOSE, CAPILLARY
Glucose-Capillary: 182 mg/dL — ABNORMAL HIGH (ref 70–99)
Glucose-Capillary: 208 mg/dL — ABNORMAL HIGH (ref 70–99)
Glucose-Capillary: 188 mg/dL — ABNORMAL HIGH (ref 70–99)
Glucose-Capillary: 215 mg/dL — ABNORMAL HIGH (ref 70–99)

## 2021-02-22 NOTE — Progress Notes (Addendum)
PROGRESS NOTE  Ej A Herter MRN:[id number] DOB: 05/28/1960 DOA: 02/16/2021 PCP: Edwards, Michelle P, NP   LOS: 5 days   Brief Narrative / Interim history: 60-year-old male with prior history of lacunar CVAs, arthritis, DM2, HTN, obesity class II, who comes to the hospital on 11/27 as a code stroke with left arm and left leg weakness.  This happened upon waking up, last known well was at 10 PM the night prior.  Neurology evaluated in the ED, an MRI demonstrated acute-subacute infarcts in the right basal ganglia and corona radiata on background of extensive chronic small vessel ischemia.  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  Neuro interventional IR was also consulted and underwent cerebral angiogram  Subjective / 24h Interval events: Denies any shortness of breath, no chest pain, no abdominal pain, nausea or vomiting.  Weakness is about the same  Assessment & Plan:  Principal Problem:   Stroke (HCC) Active Problems:   Essential hypertension   Diabetes mellitus type 2 in obese (HCC)   Elevated troponin   Prolonged QT interval   Shoulder pain, right   Demand ischemia (HCC)   Hyperlipidemia, unspecified   Hypokalemia   Leukocytosis   Obesity, Class II, BMI 35-39.9  Principal Problem Recurrent acute to subacute perforator infarcts at the right basal ganglia and corona radiata due to right ICA siphon severe stenosis -neurology and interventional radiology was consulted.  CT angiogram showed right V4 likely occluded proximal to the takeoff of the right PICA with no definitive flow seen in the right PICA which is new from prior exam.  He also has significant extensive intracranial atherosclerosis.  He underwent a 2D echocardiogram showed an EF of 60-65%.  LDL was 98 and he is on atorvastatin, dose increased from 10 to  40 mg daily.  A1c was 9.9.  Urine drug screen was positive for THC.  Cerebral angiogram showed left ICA 65-70% stenosis, bilateral ICAs-mild to moderate stenosis, right VA severe stenosis.  Neurology recommends aspirin 325 and Plavix 75 DAPT for 3 months then aspirin alone.  Awaiting CIR placement  Active Problems Essential hypertension- BP goal for him is 130-160 given severe intravascular stenosis.  Continue amlodipine, irbesartan, metoprolol.  Blood pressure overall stable  Dyspnea on 12/2-faint crackles bibasilarly, chest x-ray showed mild fluid overload.  Received Lasix x1, responded well, no dyspnea today  Right shoulder pain - Consistent with mild AC joint arthritis possibly from falling and overuse. XR without fracture or dislocation.  Supportive management  Tobacco use - Cessation counseling provided   Demand myocardial ischemia -high-sensitivity troponin flat, not in a pattern consistent with ACS.  No chest pain   Hypokalemia -continue to monitor  Hyperlipidemia-continue atorvastatin 40 mg  Leukocytosis - Suspect reactive to CVA. Resolved without abx.    T2DM - HbA1c 9.9% - SSI. Continue home linagliptin  CBG (last 3)  Recent Labs    02/21/21 2118 02/22/21 0615 02/22/21 1121  GLUCAP 269* 182* 208*     Obesity, class II-he would benefit from weight loss.  BMI 36  Scheduled Meds:   stroke: mapping our early stages of recovery book   Does not apply Once   acetaminophen  1,000 mg Oral Once   amLODipine  10 mg Oral Daily   aspirin EC  325 mg Oral Daily   atorvastatin  40 mg   Oral Daily   clopidogrel  75 mg Oral Daily   enoxaparin (LOVENOX) injection  40 mg Subcutaneous Q24H   gabapentin  300 mg Oral TID   insulin aspart  0-9 Units Subcutaneous TID WC   insulin starter kit- pen needles  1 kit Other Once   irbesartan  75 mg Oral Daily   linagliptin  5 mg Oral Daily   living well with diabetes book   Does not apply Once   mouth rinse  15 mL Mouth Rinse BID   metoprolol  succinate  25 mg Oral Daily   Oxcarbazepine  300 mg Oral TID   Continuous Infusions: PRN Meds:.acetaminophen **OR** acetaminophen (TYLENOL) oral liquid 160 mg/5 mL **OR** acetaminophen, hydrALAZINE  Diet Orders (From admission, onward)     Start     Ordered   02/19/21 1528  Diet regular Room service appropriate? Yes; Fluid consistency: Thin  Diet effective now       Question Answer Comment  Room service appropriate? Yes   Fluid consistency: Thin      02/19/21 1527            DVT prophylaxis: enoxaparin (LOVENOX) injection 40 mg Start: 02/18/21 1545 SCD's Start: 02/17/21 0227     Code Status: Full Code  Family Communication: no family at bedside   Status is: Inpatient  Remains inpatient appropriate because: Awaiting CIR bed  Level of care: Telemetry Cardiac  Consultants:  Neurology, neuroIR  Procedures:  2D echo  Microbiology  none  Antimicrobials: none    Objective: Vitals:   02/21/21 2030 02/21/21 2300 02/22/21 0611 02/22/21 1117  BP: (!) 160/74 (!) 158/84 (!) 173/90 (!) 151/82  Pulse:  95 88 95  Resp:  20 18 18  Temp:  98.1 F (36.7 C) 97.6 F (36.4 C) 97.9 F (36.6 C)  TempSrc:  Oral Oral Oral  SpO2:  95% 96% 97%  Weight:      Height:        Intake/Output Summary (Last 24 hours) at 02/22/2021 1153 Last data filed at 02/22/2021 0318 Gross per 24 hour  Intake --  Output 700 ml  Net -700 ml    Filed Weights   02/16/21 2106  Weight: 124.7 kg    Examination:  Constitutional: Is in no distress Eyes: No scleral icterus ENMT: mmm Neck: normal, supple Respiratory: Clear bilaterally, no wheezing, moves air well Cardiovascular: Regular rate and rhythm, no murmurs, no peripheral edema Abdomen: Soft, nontender, nondistended, positive bowel sounds Musculoskeletal: no clubbing / cyanosis.  Skin: No rashes seen Neurologic: CN 2-12 grossly intact.  LUE, LLE significant weakness, good strength on right, no new focal deficits   Data Reviewed:  I have independently reviewed following labs and imaging studies   CBC: Recent Labs  Lab 02/16/21 2013 02/16/21 2100 02/18/21 0353 02/21/21 0354  WBC  --  12.4* 9.0 9.0  NEUTROABS  --  8.4* 5.0  --   HGB 19.4* 16.8 15.6 13.7  HCT 57.0* 49.8 47.7 41.9  MCV  --  91.5 91.7 93.9  PLT  --  341 334 254    Basic Metabolic Panel: Recent Labs  Lab 02/16/21 2100 02/18/21 0353 02/19/21 0043 02/21/21 0354 02/22/21 0842  NA 137 141 138 136 137  K 3.1* 2.9* 3.6 3.8 3.9  CL 98 103 100 100 96*  CO2 27 28 29 30 31  GLUCOSE 181* 124* 229* 213* 178*  BUN 7* 7* 14 15 11  CREATININE 0.88 0.71 1.02 0.81 0.63  CALCIUM 9.2   8.8* 8.9 8.9 9.3  MG 1.8  --  2.0  --   --     Liver Function Tests: Recent Labs  Lab 02/16/21 2100  AST 25  ALT 17  ALKPHOS 99  BILITOT 1.5*  PROT 7.8  ALBUMIN 3.8    Coagulation Profile: Recent Labs  Lab 02/16/21 2100 02/18/21 0353  INR 1.2 1.2    HbA1C: No results for input(s): HGBA1C in the last 72 hours.  CBG: Recent Labs  Lab 02/21/21 0800 02/21/21 1205 02/21/21 2118 02/22/21 0615 02/22/21 1121  GLUCAP 206* 222* 269* 182* 208*     Recent Results (from the past 240 hour(s))  Resp Panel by RT-PCR (Flu A&B, Covid) Nasopharyngeal Swab     Status: None   Collection Time: 02/16/21  8:08 PM   Specimen: Nasopharyngeal Swab; Nasopharyngeal(NP) swabs in vial transport medium  Result Value Ref Range Status   SARS Coronavirus 2 by RT PCR NEGATIVE NEGATIVE Final    Comment: (NOTE) SARS-CoV-2 target nucleic acids are NOT DETECTED.  The SARS-CoV-2 RNA is generally detectable in upper respiratory specimens during the acute phase of infection. The lowest concentration of SARS-CoV-2 viral copies this assay can detect is 138 copies/mL. A negative result does not preclude SARS-Cov-2 infection and should not be used as the sole basis for treatment or other patient management decisions. A negative result may occur with  improper specimen  collection/handling, submission of specimen other than nasopharyngeal swab, presence of viral mutation(s) within the areas targeted by this assay, and inadequate number of viral copies(<138 copies/mL). A negative result must be combined with clinical observations, patient history, and epidemiological information. The expected result is Negative.  Fact Sheet for Patients:  https://www.fda.gov/media/152166/download  Fact Sheet for Healthcare Providers:  https://www.fda.gov/media/152162/download  This test is no t yet approved or cleared by the [place] FDA and  has been authorized for detection and/or diagnosis of SARS-CoV-2 by FDA under an Emergency Use Authorization (EUA). This EUA will remain  in effect (meaning this test can be used) for the duration of the COVID-19 declaration under Section 564(b)(1) of the Act, 21 U.S.C.section 360bbb-3(b)(1), unless the authorization is terminated  or revoked sooner.       Influenza A by PCR NEGATIVE NEGATIVE Final   Influenza B by PCR NEGATIVE NEGATIVE Final    Comment: (NOTE) The Xpert Xpress SARS-CoV-2/FLU/RSV plus assay is intended as an aid in the diagnosis of influenza from Nasopharyngeal swab specimens and should not be used as a sole basis for treatment. Nasal washings and aspirates are unacceptable for Xpert Xpress SARS-CoV-2/FLU/RSV testing.  Fact Sheet for Patients: https://www.fda.gov/media/152166/download  Fact Sheet for Healthcare Providers: https://www.fda.gov/media/152162/download  This test is not yet approved or cleared by the [place] FDA and has been authorized for detection and/or diagnosis of SARS-CoV-2 by FDA under an Emergency Use Authorization (EUA). This EUA will remain in effect (meaning this test can be used) for the duration of the COVID-19 declaration under Section 564(b)(1) of the Act, 21 U.S.C. section 360bbb-3(b)(1), unless the authorization is terminated or revoked.  Performed at [facility], [address] [place], [place] [address]       Radiology Studies: No results found.   , MD, PhD [facility]  Between 7 am - 7 pm I am available, please contact me via Amion (for emergencies) or Securechat (non urgent messages)  Between 7 pm - 7 am I am not available, please contact night coverage MD/APP via Amion   

## 2021-02-23 LAB — GLUCOSE, CAPILLARY
Glucose-Capillary: 178 mg/dL — ABNORMAL HIGH (ref 70–99)
Glucose-Capillary: 214 mg/dL — ABNORMAL HIGH (ref 70–99)

## 2021-02-23 MED ORDER — OXYCODONE HCL 5 MG PO TABS
5.0000 mg | ORAL_TABLET | Freq: Four times a day (QID) | ORAL | Status: DC | PRN
  Administered 2021-02-23 – 2021-02-27 (×9): 5 mg via ORAL
  Filled 2021-02-23 (×10): qty 1

## 2021-02-23 NOTE — Progress Notes (Signed)
PROGRESS NOTE  Brent Young IOX:[id number] DOB: 06-Sep-1960 DOA: 02/16/2021 PCP: Kerin Perna, NP   LOS: 6 days   Brief Narrative / Interim history: 60 year old male with prior history of lacunar CVAs, arthritis, DM2, HTN, obesity class II, who comes to the hospital on 11/27 as a code stroke with left arm and left leg weakness.  This happened upon waking up, last known well was at 10 PM the night prior.  Neurology evaluated in the ED, an MRI demonstrated acute-subacute infarcts in the right basal ganglia and corona radiata on background of extensive chronic small vessel ischemia.  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  Neuro interventional IR was also consulted and underwent cerebral angiogram  Subjective Patient denies any complaints.  Somewhat slow to respond.  No nausea vomiting.  No pain issues.  Unable to move his left side.    Assessment & Plan:   Recurrent acute to subacute perforator infarcts at the right basal ganglia and corona radiata due to right ICA siphon severe stenosis  Patient seen by neurology.   CT angiogram showed right V4 likely occluded proximal to the takeoff of the right PICA with no definitive flow seen in the right PICA which is new from prior exam.  He also has significant extensive intracranial atherosclerosis.   He underwent a 2D echocardiogram showed an EF of 60-65%.   LDL was 98 and he is on atorvastatin, dose increased from 10 to 40 mg daily.   A1c was 9.9.  Urine drug screen was positive for THC.   Interventional radiology consulted by neurology for cerebral angiogram.  Cerebral angiogram showed left ICA 65-70% stenosis, bilateral ICAs-mild to moderate stenosis, right VA severe stenosis.   Neurology recommends aspirin 325 and Plavix 75 DAPT for 3 months then aspirin alone.    Awaiting CIR placement  Essential hypertension BP goal for him is 130-160 given severe intravascular stenosis.   Continue amlodipine, irbesartan, metoprolol.  Blood pressure stable for the most part.  Pulmonary edema Noted to be dyspneic on 12/2.  Faint crackles bibasilarly, chest x-ray showed mild fluid overload.  Received Lasix x1, responded well.  Seems to be stable from a respiratory standpoint.  Currently on 2 L of oxygen by nasal cannula.    Diabetes mellitus type 2, uncontrolled with hyperglycemia HbA1c 9.9.  Linagliptin being continued along with SSI.  CBGs could be better controlled.  Could consider adding metformin at discharge.  Right shoulder pain Consistent with mild AC joint arthritis possibly from falling and overuse. XR without fracture or dislocation.  Supportive management  Tobacco use  Cessation counseling provided   Demand myocardial ischemia High-sensitivity troponin flat, not in a pattern consistent with ACS.  No chest pain   Hypokalemia  Continue to monitor  Hyperlipidemia Continue atorvastatin 40 mg  Leukocytosis  Suspect reactive to CVA. Resolved without abx.     Obesity, class II Estimated body mass index is 36.28 kg/m as calculated from the following:   Height as of this encounter: _0  (1.854 m).   Weight as of this encounter: 124.7 kg.   DVT prophylaxis: Lovenox CODE STATUS: Full code Family communication: No family at bedside.  Discussed with patient Disposition: Waiting on inpatient rehabilitation  Status is: Inpatient  Remains inpatient appropriate because: Acute stroke        Scheduled  Meds:   stroke: mapping our early stages of recovery book   Does not apply Once   acetaminophen  1,000 mg Oral Once   amLODipine  10 mg Oral Daily   aspirin EC  325 mg Oral Daily   atorvastatin  40 mg Oral Daily   clopidogrel  75 mg Oral Daily   enoxaparin (LOVENOX) injection  40 mg Subcutaneous Q24H   gabapentin  300 mg Oral TID   insulin  aspart  0-9 Units Subcutaneous TID WC   insulin starter kit- pen needles  1 kit Other Once   irbesartan  75 mg Oral Daily   linagliptin  5 mg Oral Daily   living well with diabetes book   Does not apply Once   mouth rinse  15 mL Mouth Rinse BID   metoprolol succinate  25 mg Oral Daily   Oxcarbazepine  300 mg Oral TID   Continuous Infusions: PRN Meds:.acetaminophen **OR** acetaminophen (TYLENOL) oral liquid 160 mg/5 mL **OR** acetaminophen, hydrALAZINE  Diet Orders (From admission, onward)     Start     Ordered   02/19/21 1528  Diet regular Room service appropriate? Yes; Fluid consistency: Thin  Diet effective now       Question Answer Comment  Room service appropriate? Yes   Fluid consistency: Thin      02/19/21 1527            Consultants:  Neurology, neuroIR  Procedures:  2D echo  Microbiology  none  Antimicrobials: none    Objective: Vitals:   02/22/21 1520 02/22/21 1923 02/22/21 2320 02/23/21 0321  BP: (!) 159/91 138/76 (!) 155/74 (!) 145/74  Pulse: 98 99  89  Resp: _0 Temp: 97.7 F (36.5 C) 98.3 F (36.8 C) 97.8 F (36.6 C) 97.7 F (36.5 C)  TempSrc: Oral Oral Oral Oral  SpO2: 97% 92% 94% 97%  Weight:      Height:        Intake/Output Summary (Last 24 hours) at 02/23/2021 1012 Last data filed at 02/22/2021 1455 Gross per 24 hour  Intake --  Output 300 ml  Net -300 ml    Filed Weights   2021-02-21 2106  Weight: 124.7 kg    Examination:  General appearance: Awake alert.  In no distress Resp: Diminished air entry at the bases.  No tachypnea.  No crackles appreciated today.  No wheezing or rhonchi Cardio: S1-S2 is normal regular.  No S3-S4.  No rubs murmurs or bruit GI: Abdomen is soft.  Nontender nondistended.  Bowel sounds are present normal.  No masses organomegaly Extremities: No edema.  Neurologic: Awake alert.  Left hemiparesis noted.    Data Reviewed: I have independently reviewed following labs and imaging studies    CBC: Recent Labs  Lab 02-21-21 2013 2021/02/21 2100 02/18/21 0353 02/21/21 0354  WBC  --  12.4* 9.0 9.0  NEUTROABS  --  8.4* 5.0  --   HGB 19.4* 16.8 15.6 13.7  HCT 57.0* 49.8 47.7 41.9  MCV  --  91.5 91.7 93.9  PLT  --  341 334 782    Basic Metabolic Panel: Recent Labs  Lab 02/21/21 2100 02/18/21 0353 02/19/21 0043 02/21/21 0354 02/22/21 0842  NA 137 141 138 136 137  K 3.1* 2.9* 3.6 3.8 3.9  CL 98 103 100 100 96*  CO2 _1 GLUCOSE 181* 124* 229* 213* 178*  BUN 7* 7* _2 CREATININE 0.88 0.71 1.02  0.81 0.63  CALCIUM 9.2 8.8* 8.9 8.9 9.3  MG 1.8  --  2.0  --   --     Liver Function Tests: Recent Labs  Lab 02/16/21 2100  AST 25  ALT 17  ALKPHOS 99  BILITOT 1.5*  PROT 7.8  ALBUMIN 3.8    Coagulation Profile: Recent Labs  Lab 02/16/21 2100 02/18/21 0353  INR 1.2 1.2     CBG: Recent Labs  Lab 02/21/21 2118 02/22/21 0615 02/22/21 1121 02/22/21 1659 02/22/21 2116  GLUCAP 269* 182* 208* 188* 215*     Recent Results (from the past 240 hour(s))  Resp Panel by RT-PCR (Flu A&B, Covid) Nasopharyngeal Swab     Status: None   Collection Time: 02/16/21  8:08 PM   Specimen: Nasopharyngeal Swab; Nasopharyngeal(NP) swabs in vial transport medium  Result Value Ref Range Status   SARS Coronavirus 2 by RT PCR NEGATIVE NEGATIVE Final    Comment: (NOTE) SARS-CoV-2 target nucleic acids are NOT DETECTED.  The SARS-CoV-2 RNA is generally detectable in upper respiratory specimens during the acute phase of infection. The lowest concentration of SARS-CoV-2 viral copies this assay can detect is 138 copies/mL. A negative result does not preclude SARS-Cov-2 infection and should not be used as the sole basis for treatment or other patient management decisions. A negative result may occur with  improper specimen collection/handling, submission of specimen other than nasopharyngeal swab, presence of viral mutation(s) within the areas targeted by this  assay, and inadequate number of viral copies(<138 copies/mL). A negative result must be combined with clinical observations, patient history, and epidemiological information. The expected result is Negative.  Fact Sheet for Patients:  EntrepreneurPulse.com.au  Fact Sheet for Healthcare Providers:  IncredibleEmployment.be  This test is no t yet approved or cleared by the [place] FDA and  has been authorized for detection and/or diagnosis of SARS-CoV-2 by FDA under an Emergency Use Authorization (EUA). This EUA will remain  in effect (meaning this test can be used) for the duration of the COVID-19 declaration under Section 564(b)(1) of the Act, 21 U.S.C.section 360bbb-3(b)(1), unless the authorization is terminated  or revoked sooner.       Influenza A by PCR NEGATIVE NEGATIVE Final   Influenza B by PCR NEGATIVE NEGATIVE Final    Comment: (NOTE) The Xpert Xpress SARS-CoV-2/FLU/RSV plus assay is intended as an aid in the diagnosis of influenza from Nasopharyngeal swab specimens and should not be used as a sole basis for treatment. Nasal washings and aspirates are unacceptable for Xpert Xpress SARS-CoV-2/FLU/RSV testing.  Fact Sheet for Patients: EntrepreneurPulse.com.au  Fact Sheet for Healthcare Providers: IncredibleEmployment.be  This test is not yet approved or cleared by the [place] FDA and has been authorized for detection and/or diagnosis of SARS-CoV-2 by FDA under an Emergency Use Authorization (EUA). This EUA will remain in effect (meaning this test can be used) for the duration of the COVID-19 declaration under Section 564(b)(1) of the Act, 21 U.S.C. section 360bbb-3(b)(1), unless the authorization is terminated or revoked.  Performed at [facility], [place] [address], [place], [place] [address]       Radiology Studies: No results found.  Bonnielee Haff 02/23/2021

## 2021-02-24 LAB — GLUCOSE, CAPILLARY
Glucose-Capillary: 236 mg/dL — ABNORMAL HIGH (ref 70–99)
Glucose-Capillary: 190 mg/dL — ABNORMAL HIGH (ref 70–99)
Glucose-Capillary: 196 mg/dL — ABNORMAL HIGH (ref 70–99)
Glucose-Capillary: 190 mg/dL — ABNORMAL HIGH (ref 70–99)
Glucose-Capillary: 185 mg/dL — ABNORMAL HIGH (ref 70–99)
Glucose-Capillary: 180 mg/dL — ABNORMAL HIGH (ref 70–99)
Glucose-Capillary: 203 mg/dL — ABNORMAL HIGH (ref 70–99)

## 2021-02-24 MED ORDER — METFORMIN HCL 500 MG PO TABS
1000.0000 mg | ORAL_TABLET | Freq: Two times a day (BID) | ORAL | Status: DC
  Administered 2021-02-24 – 2021-02-27 (×6): 1000 mg via ORAL
  Filled 2021-02-24 (×6): qty 2

## 2021-02-24 NOTE — Plan of Care (Signed)
  Problem: Education: Goal: Knowledge of General Education information will improve Description: Including pain rating scale, medication(s)/side effects and non-pharmacologic comfort measures Outcome: Progressing   Problem: Health Behavior/Discharge Planning: Goal: Ability to manage health-related needs will improve Outcome: Progressing   Problem: Clinical Measurements: Goal: Will remain free from infection Outcome: Progressing Goal: Diagnostic test results will improve Outcome: Progressing Goal: Respiratory complications will improve Outcome: Progressing Goal: Cardiovascular complication will be avoided Outcome: Progressing   Problem: Nutrition: Goal: Adequate nutrition will be maintained Outcome: Progressing   Problem: Coping: Goal: Level of anxiety will decrease Outcome: Progressing   Problem: Elimination: Goal: Will not experience complications related to bowel motility Outcome: Progressing Goal: Will not experience complications related to urinary retention Outcome: Progressing   Problem: Pain Managment: Goal: General experience of comfort will improve Outcome: Progressing   Problem: Safety: Goal: Ability to remain free from injury will improve Outcome: Progressing   Problem: Skin Integrity: Goal: Risk for impaired skin integrity will decrease Outcome: Progressing   Problem: Education: Goal: Knowledge of disease or condition will improve Outcome: Progressing Goal: Knowledge of secondary prevention will improve (SELECT ALL) Outcome: Progressing Goal: Knowledge of patient specific risk factors will improve (INDIVIDUALIZE FOR PATIENT) Outcome: Progressing

## 2021-02-24 NOTE — Progress Notes (Signed)
PROGRESS NOTE  Brent Young:[id number] DOB: Jun 11, 1960 DOA: 02/16/2021 PCP: Kerin Perna, NP   LOS: 7 days   Brief Narrative / Interim history: 60 year old male with prior history of lacunar CVAs, arthritis, DM2, HTN, obesity class II, who comes to the hospital on 11/27 as a code stroke with left arm and left leg weakness.  This happened upon waking up, last known well was at 10 PM the night prior.  Neurology evaluated in the ED, an MRI demonstrated acute-subacute infarcts in the right basal ganglia and corona radiata on background of extensive chronic small vessel ischemia.  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  Neuro interventional IR was also consulted and underwent cerebral angiogram  Subjective Patient without any new complaints this morning.  Still with significant weakness in the left upper and lower extremity.  Denies any chest pain shortness of breath.     Assessment & Plan:   Recurrent acute to subacute perforator infarcts at the right basal ganglia and corona radiata due to right ICA siphon severe stenosis  Patient seen by neurology.   CT angiogram showed right V4 likely occluded proximal to the takeoff of the right PICA with no definitive flow seen in the right PICA which is new from prior exam.  He also has significant extensive intracranial atherosclerosis.   He underwent a 2D echocardiogram showed an EF of 60-65%.   LDL was 98 and he is on atorvastatin, dose increased from 10 to 40 mg daily.   A1c was 9.9.  Urine drug screen was positive for THC.   Interventional radiology consulted by neurology for cerebral angiogram.  Cerebral angiogram showed left ICA 65-70% stenosis, bilateral ICAs-mild to moderate stenosis, right VA severe stenosis.   Neurology recommends aspirin 325 and Plavix 75 DAPT  for 3 months then aspirin alone.   Await CIR placement.  Neurological status is stable.  Essential hypertension BP goal for him is 130-160 given severe intravascular stenosis.   Continue amlodipine, irbesartan, metoprolol.  Blood pressure stable for the most part.  Pulmonary edema Noted to be dyspneic on 12/2.  Faint crackles bibasilarly, chest x-ray showed mild fluid overload.  Received Lasix x1, responded well.  Seems to be stable from a respiratory standpoint.  Currently on 2 L of oxygen by nasal cannula.  Wean down as tolerated.  Echocardiogram showed normal systolic function  Diabetes mellitus type 2, uncontrolled with hyperglycemia HbA1c 9.9.  Linagliptin being continued along with SSI.  CBGs could be better controlled.  Could consider adding metformin at discharge.  Right shoulder pain Consistent with mild AC joint arthritis possibly from falling and overuse. XR without fracture or dislocation.  Supportive management  Tobacco use  Cessation counseling provided   Demand myocardial ischemia High-sensitivity troponin flat, not in a pattern consistent with ACS.  No chest pain   Hypokalemia  Continue to monitor  Hyperlipidemia Continue atorvastatin 40 mg  Leukocytosis  Suspect reactive to CVA. Resolved without abx.     Obesity, class II Estimated body mass index is 36.28 kg/m as calculated from the following:   Height as of this encounter: _0  (1.854 m).   Weight as of this encounter: 124.7 kg.   DVT prophylaxis: Lovenox CODE STATUS: Full code Family communication: No family at bedside.  Discussed with patient Disposition: Waiting on inpatient rehabilitation  Status is: Inpatient  Remains inpatient appropriate because: Acute stroke        Scheduled Meds:   stroke: mapping our early stages of recovery book   Does not apply Once   acetaminophen  1,000 mg Oral Once   amLODipine  10 mg Oral Daily   aspirin EC  325 mg Oral Daily   atorvastatin  40 mg Oral  Daily   clopidogrel  75 mg Oral Daily   enoxaparin (LOVENOX) injection  40 mg Subcutaneous Q24H   gabapentin  300 mg Oral TID   insulin aspart  0-9 Units Subcutaneous TID WC   insulin starter kit- pen needles  1 kit Other Once   irbesartan  75 mg Oral Daily   linagliptin  5 mg Oral Daily   living well with diabetes book   Does not apply Once   mouth rinse  15 mL Mouth Rinse BID   metoprolol succinate  25 mg Oral Daily   Oxcarbazepine  300 mg Oral TID   Continuous Infusions: PRN Meds:.acetaminophen **OR** acetaminophen (TYLENOL) oral liquid 160 mg/5 mL **OR** acetaminophen, hydrALAZINE, oxyCODONE  Diet Orders (From admission, onward)     Start     Ordered   02/19/21 1528  Diet regular Room service appropriate? Yes; Fluid consistency: Thin  Diet effective now       Question Answer Comment  Room service appropriate? Yes   Fluid consistency: Thin      02/19/21 1527            Consultants:  Neurology, neuroIR  Procedures:  2D echo  Microbiology  none  Antimicrobials: none    Objective: Vitals:   02/23/21 2052 02/24/21 0047 02/24/21 0447 02/24/21 0727  BP: (!) 152/73 (!) 142/79 (!) 142/69 (!) 156/97  Pulse: 72 88 88 92  Resp: _0 Temp: 98.2 F (36.8 C) 97.6 F (36.4 C) 97.9 F (36.6 C) 97.8 F (36.6 C)  TempSrc: Oral Oral Oral Oral  SpO2: 90% 96% 100% 96%  Weight:      Height:       No intake or output data in the 24 hours ending 02/24/21 1002  Filed Weights   02/16/21 2106  Weight: 124.7 kg    Examination:  General appearance: Awake alert.  In no distress Resp: Clear to auscultation bilaterally.  Normal effort Cardio: S1-S2 is normal regular.  No S3-S4.  No rubs murmurs or bruit GI: Abdomen is soft.  Nontender nondistended.  Bowel sounds are present normal.  No masses organomegaly Extremities: No edema.  Neurologic: Alert and oriented x3.  Left hemiparesis noted     Data Reviewed: I have independently reviewed following labs and  imaging studies   CBC: Recent Labs  Lab 02/18/21 0353 02/21/21 0354  WBC 9.0 9.0  NEUTROABS 5.0  --   HGB 15.6 13.7  HCT 47.7 41.9  MCV 91.7 93.9  PLT 334 474    Basic Metabolic Panel: Recent Labs  Lab 02/18/21 0353 02/19/21 0043 02/21/21 0354 02/22/21 0842  NA 141 138 136 137  K 2.9* 3.6 3.8 3.9  CL 103 100 100 96*  CO2 _1 GLUCOSE 124* 229* 213* 178*  BUN 7* _2 CREATININE 0.71 1.02 0.81 0.63  CALCIUM 8.8* 8.9 8.9 9.3  MG  --  2.0  --   --     Coagulation Profile: Recent Labs  Lab 02/18/21 0353  INR 1.2     CBG: Recent Labs  Lab 02/22/21 1659 02/22/21 2116 02/23/21 1213 02/23/21 2120 02/24/21 0651  GLUCAP 188* 215* 178* 214* 196*     Recent Results (from the past 240 hour(s))  Resp Panel by RT-PCR (Flu A&B, Covid) Nasopharyngeal Swab     Status: None   Collection Time: 02/16/21  8:08 PM   Specimen: Nasopharyngeal Swab; Nasopharyngeal(NP) swabs in vial transport medium  Result Value Ref Range Status   SARS Coronavirus 2 by RT PCR NEGATIVE NEGATIVE Final    Comment: (NOTE) SARS-CoV-2 target nucleic acids are NOT DETECTED.  The SARS-CoV-2 RNA is generally detectable in upper respiratory specimens during the acute phase of infection. The lowest concentration of SARS-CoV-2 viral copies this assay can detect is 138 copies/mL. A negative result does not preclude SARS-Cov-2 infection and should not be used as the sole basis for treatment or other patient management decisions. A negative result may occur with  improper specimen collection/handling, submission of specimen other than nasopharyngeal swab, presence of viral mutation(s) within the areas targeted by this assay, and inadequate number of viral copies(<138 copies/mL). A negative result must be combined with clinical observations, patient history, and epidemiological information. The expected result is Negative.  Fact Sheet for Patients:   EntrepreneurPulse.com.au  Fact Sheet for Healthcare Providers:  IncredibleEmployment.be  This test is no t yet approved or cleared by the [place] FDA and  has been authorized for detection and/or diagnosis of SARS-CoV-2 by FDA under an Emergency Use Authorization (EUA). This EUA will remain  in effect (meaning this test can be used) for the duration of the COVID-19 declaration under Section 564(b)(1) of the Act, 21 U.S.C.section 360bbb-3(b)(1), unless the authorization is terminated  or revoked sooner.       Influenza A by PCR NEGATIVE NEGATIVE Final   Influenza B by PCR NEGATIVE NEGATIVE Final    Comment: (NOTE) The Xpert Xpress SARS-CoV-2/FLU/RSV plus assay is intended as an aid in the diagnosis of influenza from Nasopharyngeal swab specimens and should not be used as a sole basis for treatment. Nasal washings and aspirates are unacceptable for Xpert Xpress SARS-CoV-2/FLU/RSV testing.  Fact Sheet for Patients: EntrepreneurPulse.com.au  Fact Sheet for Healthcare Providers: IncredibleEmployment.be  This test is not yet approved or cleared by the [place] FDA and has been authorized for detection and/or diagnosis of SARS-CoV-2 by FDA under an Emergency Use Authorization (EUA). This EUA will remain in effect (meaning this test can be used) for the duration of the COVID-19 declaration under Section 564(b)(1) of the Act, 21 U.S.C. section 360bbb-3(b)(1), unless the authorization is terminated or revoked.  Performed at [facility], [place] [address] [place], [place] [address]       Radiology Studies: No results found.  Bonnielee Haff 02/24/2021

## 2021-02-24 NOTE — Progress Notes (Signed)
Inpatient Rehab Admissions Coordinator:   Met with patient and his son at bedside.  Pt has a bill from [facility] indicating that he has auto-pay set up so should have active coverage.  Note policy number on bill is different from what we have in our system so will try to verify coverage and start insurance auth process if it's active.   Shann Medal, PT, DPT Admissions Coordinator [phone] 02/24/21  1:33 PM

## 2021-02-24 NOTE — Progress Notes (Addendum)
Physical Therapy Treatment Patient Details Name: Brent Young MRN: [id number] DOB: [birth year] Today's Date: 02/24/2021   History of Present Illness Pt is a 60 y/o male presenting w/ LUE and LLE weakness. 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 PMH significant for lacunar CVA's, DM II, HTN.    PT Comments    Pt received in supine and eagerly agreeable to therapy session. Pt demonstrated improved ability to stand this session, completing x3 trials to [facility] with maxA+2 for power up and steadying upon standing due to heavy posterior and L lateral lean. Pt able to weight shift onto RLE when cued, not able to maintain and required frequent cueing while standing. Pt transferred to chair via Stedy, with lift pad underneath in chair for RN to utilize [facility] to safely return to bed. Continue to recommend AIR upon DC as pt is very motivated to return to prior level of function. Pt continues to benefit from PT services to progress toward functional mobility goals.      Recommendations for follow up therapy are one component of a multi-disciplinary discharge planning process, led by the attending physician.  Recommendations may be updated based on patient status, additional functional criteria and insurance authorization.  Follow Up Recommendations  Acute inpatient rehab (3hours/day)     Assistance Recommended at Discharge    Equipment Recommendations  Wheelchair (measurements PT);Wheelchair cushion (measurements PT)    Recommendations for Other Services Rehab consult     Precautions / Restrictions Precautions Precautions: Fall Precaution Comments: Dense L side Required Braces or Orthoses: Other Brace Other Brace: AFO for LLE Restrictions Weight Bearing Restrictions: No     Mobility  Bed Mobility Overal bed mobility: Needs Assistance Bed Mobility: Rolling Rolling: Max assist;+2 for physical assistance;Mod assist Sidelying to sit: Max assist;+2 for  physical assistance  General bed mobility comments: Pt maxA+2 to roll to L and modA+2 to roll to R, pt able to help initiate but needs assistance following through, help with trunk and BLE management when sitting    Transfers Overall transfer level: Needs assistance Equipment used: Ambulation equipment used   Sit to Stand: Max assist;+2 physical assistance;From elevated surface  General transfer comment: Pt able to stand to [facility] x3 trials with heavy L lean, requiring maxA+2 to power up and to maintain standing. Transfer from bed>chair via Antony Salmon, [employer] underneath for nursing to return him to bed Transfer via Lift Equipment: Stedy  Modified Rankin (Stroke Patients Only) Modified Rankin (Stroke Patients Only) Pre-Morbid Rankin Score: No significant disability Modified Rankin: Severe disability     Balance Overall balance assessment: Needs assistance Sitting-balance support: Feet supported;Single extremity supported Sitting balance-Leahy Scale: Poor Sitting balance - Comments: Sitting EOB requiring therapist behind to prevent posterior LOB, also heavy lean to L that can be corrected short term with verbal cues Postural control: Posterior lean;Left lateral lean Standing balance support: Bilateral upper extremity supported Standing balance-Leahy Scale: Zero Standing balance comment: maxA+2 in Stedy with heavy L lean      Cognition Arousal/Alertness: Awake/alert Behavior During Therapy: WFL for tasks assessed/performed Overall Cognitive Status: Within Functional Limits for tasks assessed     Exercises Other Exercises Other Exercises: PROM of L ankle in supine into DF/PF Other Exercises: PROM of L knee in supine into flexion and extension    General Comments        Pertinent Vitals/Pain Pain Assessment: No/denies pain     PT Goals (current goals can now be found in the care  plan section) Acute Rehab PT Goals Patient Stated Goal: Today, goal was to be up in the chair.  Motivated for rehab. PT Goal Formulation: With patient Time For Goal Achievement: 03/04/21 Progress towards PT goals: Progressing toward goals    Frequency    Min 4X/week      PT Plan Current plan remains appropriate    Co-evaluation PT/OT/SLP Co-Evaluation/Treatment: Yes Reason for Co-Treatment: Complexity of the patient's impairments (multi-system involvement);For patient/therapist safety;To address functional/ADL transfers PT goals addressed during session: Balance;Strengthening/ROM        AM-PAC PT "6 Clicks" Mobility   Outcome Measure  Help needed turning from your back to your side while in a flat bed without using bedrails?: Total Help needed moving from lying on your back to sitting on the side of a flat bed without using bedrails?: Total Help needed moving to and from a bed to a chair (including a wheelchair)?: Total Help needed standing up from a chair using your arms (e.g., wheelchair or bedside chair)?: Total Help needed to walk in hospital room?: Total Help needed climbing 3-5 steps with a railing? : Total 6 Click Score: 6    End of Session Equipment Utilized During Treatment: Gait belt Activity Tolerance: Patient tolerated treatment well Patient left: in chair;with call bell/phone within reach;with chair alarm set Nurse Communication: Mobility status;Need for lift equipment (Utilize maximove to return patient to bed) PT Visit Diagnosis: Unsteadiness on feet (R26.81);Hemiplegia and hemiparesis Hemiplegia - Right/Left: Left Hemiplegia - dominant/non-dominant: Non-dominant Hemiplegia - caused by: Cerebral infarction     Time: 1111-1140 PT Time Calculation (min) (ACUTE ONLY): 29 min  Charges:  $Therapeutic Activity: 8-22 mins                     Harland German, Student PTA CI: Carly P., PTA  Carly M Poff 02/24/2021, 1:34 PM

## 2021-02-24 NOTE — Progress Notes (Signed)
Inpatient Diabetes Program Recommendations  AACE/ADA: New Consensus Statement on Inpatient Glycemic Control (2015)  Target Ranges:  Prepandial:   less than 140 mg/dL      Peak postprandial:   less than 180 mg/dL (1-2 hours)      Critically ill patients:  140 - 180 mg/dL   Lab Results  Component Value Date   GLUCAP 196 (H) 02/24/2021   HGBA1C 9.9 (H) 02/17/2021    Review of Glycemic Control  Latest Reference Range & Units 02/22/21 11:21 02/22/21 16:59 02/22/21 21:16 02/23/21 06:14 02/23/21 12:13 02/23/21 21:20 02/24/21 06:51  Glucose-Capillary 70 - 99 mg/dL 614 (H) 431 (H) 540 (H) 190 (H) 178 (H) 214 (H) 196 (H)   Diabetes history: DM 2 Outpatient Diabetes medications:  Amaryl 4 mg QD, Metformin  1000 mg BID, Januvia 50 mg QD Current orders for Inpatient glycemic control:  Novolog sensitive tid with meals Tradjenta 5 mg daily  Inpatient Diabetes Program Recommendations:    May consider adding Semglee 10 units daily.  Patient has family at home that could probably help him with insulin administration if needed.  May consider restarting Metformin 1000 mg bid if appropriate to help meet glucose goals.   Thanks,  Beryl Meager, RN, BC-ADM Inpatient Diabetes Coordinator Pager [phone]  (8a-5p)

## 2021-02-24 NOTE — Progress Notes (Signed)
Occupational Therapy Treatment Patient Details Name: Brent Young MRN: [id number] DOB: 02/23/1961 Today's Date: 02/24/2021   History of present illness Pt is a 60 y/o male presenting w/ LUE and LLE weakness. 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 PMH significant for lacunar CVA's, DM II, HTN.   OT comments  Brent Young is progressing well and extremely motivated to participate with therapy. Session started at bed level with rear peri hygiene given max A. LUE exercises completed in supine (see below), and pt verbalized great understanding of exercises to complete outside of therapy times. Noted improved ROM and strength in LUE. He required max A +2 for sup>sit transfer, and fluctuated from close min guard to max A for sitting balance. Pt able to stand in steady frame and max A +2 with heavy L lateral lean 3x. He continues to benefit from OT acutely. D/c recommendation remains appropriate.    Recommendations for follow up therapy are one component of a multi-disciplinary discharge planning process, led by the attending physician.  Recommendations may be updated based on patient status, additional functional criteria and insurance authorization.    Follow Up Recommendations  Acute inpatient rehab (3hours/day)    Assistance Recommended at Discharge Frequent or constant Supervision/Assistance  Equipment Recommendations  Tub/shower bench;Wheelchair (measurements OT);Wheelchair cushion (measurements OT)       Precautions / Restrictions Precautions Precautions: Fall Precaution Comments: Dense L side Required Braces or Orthoses: Other Brace Other Brace: AFO for LLE Restrictions Weight Bearing Restrictions: No       Mobility Bed Mobility Overal bed mobility: Needs Assistance Bed Mobility: Rolling Rolling: Max assist;+2 for physical assistance;Mod assist Sidelying to sit: Max assist;+2 for physical assistance       General bed mobility comments: Pt  maxA+2 to roll to L and modA+2 to roll to R, pt able to help initiate but needs assistance following through, help with trunk and BLE management when sitting. Spent increased time on sitting EOB, pt intermittently holding his balance with min guard - benefits from verbal cues to correct posture and find midline    Transfers Overall transfer level: Needs assistance Equipment used: Ambulation equipment used Transfers: Sit to/from Stand;Bed to chair/wheelchair/BSC Sit to Stand: Max assist;+2 physical assistance;From elevated surface           General transfer comment: Pt able to stand to [facility] x3 trials with heavy L lean, requiring maxA+2 to power up and to maintain standing. Transfer from bed>chair via Antony Salmon, [employer] underneath for nursing to return him to bed Transfer via Lift Equipment: Stedy   Balance Overall balance assessment: Needs assistance Sitting-balance support: Feet supported;Single extremity supported Sitting balance-Leahy Scale: Poor Sitting balance - Comments: Sitting EOB requiring therapist behind to prevent posterior LOB, also heavy lean to L that can be corrected short term with verbal cues Postural control: Posterior lean;Left lateral lean Standing balance support: Bilateral upper extremity supported Standing balance-Leahy Scale: Zero Standing balance comment: maxA+2 in Stedy with heavy L lean                           ADL either performed or assessed with clinical judgement   ADL Overall ADL's : Needs assistance/impaired                         Toilet Transfer: Maximal assistance;+2 for physical assistance;+2 for safety/equipment Toilet Transfer Details (indicate cue type and reason): simulated with use of  steady this session Toileting- Clothing Manipulation and Hygiene: Maximal assistance;Bed level Toileting - Clothing Manipulation Details (indicate cue type and reason): max A at bed level; NT assisting pt upon arrival. overall max A for  hygiene     Functional mobility during ADLs: Maximal assistance;+2 for physical assistance;+2 for safety/equipment General ADL Comments: session focused on ROM of LUE, sitting balance, standing with steady frame and chair transfer    Extremity/Trunk Assessment Upper Extremity Assessment Upper Extremity Assessment: LUE deficits/detail LUE Deficits / Details: improved movement against gravity noted, still overall 3-/4 LUE Sensation: decreased light touch;decreased proprioception LUE Coordination: decreased fine motor;decreased gross motor   Lower Extremity Assessment Lower Extremity Assessment: Defer to PT evaluation        Vision   Vision Assessment?: No apparent visual deficits   Perception Perception Perception: Not tested   Praxis Praxis Praxis: Not tested    Cognition Arousal/Alertness: Awake/alert Behavior During Therapy: WFL for tasks assessed/performed Overall Cognitive Status: Within Functional Limits for tasks assessed              Exercises Exercises: Other exercises;General Upper Extremity General Exercises - Upper Extremity Elbow Flexion: AAROM;5 reps;Left;Supine Elbow Extension: AAROM;5 reps;Supine Wrist Flexion: AAROM;Left;10 reps;Supine Wrist Extension: AROM;Left;5 reps;Supine Digit Composite Flexion: AAROM;Left;5 reps;Supine Composite Extension: AAROM;Left;5 reps;Supine Other Exercises Other Exercises: Encouraged pt to complete LUE exercises as much as possible throughout the day Other Exercises: PROM of L knee in supine into flexion and extension      General Comments VSS on RA, pt with bloody pad under him upon arrival, small scratch on upper thigh noted. RN and NT aware    Pertinent Vitals/ Pain       Pain Assessment: No/denies pain   Frequency  Min 2X/week        Progress Toward Goals  OT Goals(current goals can now be found in the care plan section)  Progress towards OT goals: Progressing toward goals  Acute Rehab OT  Goals Patient Stated Goal: to go to rehab OT Goal Formulation: With patient Time For Goal Achievement: 03/04/21 Potential to Achieve Goals: Fair ADL Goals Pt Will Perform Grooming: with set-up;sitting Pt Will Perform Lower Body Dressing: with mod assist;with adaptive equipment;sit to/from stand Pt Will Transfer to Toilet: squat pivot transfer;with min assist;bedside commode Pt/caregiver will Perform Home Exercise Program: Left upper extremity;Increased ROM;Increased strength;With written HEP provided  Plan Discharge plan remains appropriate    Co-evaluation    PT/OT/SLP Co-Evaluation/Treatment: Yes Reason for Co-Treatment: Complexity of the patient's impairments (multi-system involvement);Necessary to address cognition/behavior during functional activity;To address functional/ADL transfers PT goals addressed during session: Balance;Strengthening/ROM OT goals addressed during session: ADL's and self-care;Proper use of Adaptive equipment and DME;Strengthening/ROM      AM-PAC OT "6 Clicks" Daily Activity     Outcome Measure   Help from another person eating meals?: A Little Help from another person taking care of personal grooming?: A Lot Help from another person toileting, which includes using toliet, bedpan, or urinal?: A Lot Help from another person bathing (including washing, rinsing, drying)?: A Lot Help from another person to put on and taking off regular upper body clothing?: A Lot Help from another person to put on and taking off regular lower body clothing?: A Lot 6 Click Score: 13    End of Session Equipment Utilized During Treatment: Gait belt (steady)  OT Visit Diagnosis: Unsteadiness on feet (R26.81);Other abnormalities of gait and mobility (R26.89);History of falling (Z91.81);Other symptoms and signs involving the nervous system (R29.898);Other symptoms and signs  involving cognitive function;Pain   Activity Tolerance Patient tolerated treatment well   Patient Left  in chair;with call bell/phone within reach;with chair alarm set   Nurse Communication Mobility status        Time: 1111-1140 OT Time Calculation (min): 29 min  Charges: OT General Charges $OT Visit: 1 Visit OT Treatments $Self Care/Home Management : 8-22 mins   Raelea Gosse A Daimien Patmon 02/24/2021, 1:50 PM

## 2021-02-25 LAB — GLUCOSE, CAPILLARY
Glucose-Capillary: 179 mg/dL — ABNORMAL HIGH (ref 70–99)
Glucose-Capillary: 167 mg/dL — ABNORMAL HIGH (ref 70–99)
Glucose-Capillary: 137 mg/dL — ABNORMAL HIGH (ref 70–99)

## 2021-02-25 NOTE — Progress Notes (Addendum)
Physical Therapy Treatment Patient Details Name: Brent Young MRN: [id number] DOB: 1960-06-21 Today's Date: 02/25/2021   History of Present Illness Pt is a 60 y/o male presenting w/ LUE and LLE weakness. 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 PMH significant for lacunar CVA's, DM II, HTN.    PT Comments    Pt received in supine and eagerly agreeable to therapy session. Pt performed STS x3 trials in [place] with maxA+2 due to heavy L lean requiring verbal and tactile cues to weight shift onto R with poor carryover this session. Pt demonstrated slightly improved sitting balance today with decreased posterior lean but needs more assist for seated balance when fatigued. Pt reported 5/10 modified RPE (fatigue) at end of session. Continue to recommend AIR upon DC as pt is very motivated to return to prior level of function. Pt continues to benefit from PT services to progress toward functional mobility goals.       Recommendations for follow up therapy are one component of a multi-disciplinary discharge planning process, led by the attending physician.  Recommendations may be updated based on patient status, additional functional criteria and insurance authorization.  Follow Up Recommendations  Acute inpatient rehab (3hours/day)     Assistance Recommended at Discharge Frequent or constant Supervision/Assistance  Equipment Recommendations  Wheelchair (measurements PT);Wheelchair cushion (measurements PT)    Recommendations for Other Services Rehab consult     Precautions / Restrictions Precautions Precautions: Fall Precaution Comments: BP goal 130-160 per neuro MD; Dense L side deficits Required Braces or Orthoses: Other Brace Other Brace: AFO for LLE Restrictions Weight Bearing Restrictions: No     Mobility  Bed Mobility Overal bed mobility: Needs Assistance Bed Mobility: Rolling;Sidelying to Sit;Sit to Sidelying Rolling: Max assist;+2 for  physical assistance;Mod assist Sidelying to sit: Max assist;+2 for physical assistance    Sit to sidelying: Max assist;+2 for physical assistance General bed mobility comments: Pt maxA to roll to R, pt able to help initiate but needs assistance following through, assist with trunk and BLE management when sitting    Transfers Overall transfer level: Needs assistance Equipment used: Ambulation equipment used Transfers: Sit to/from Stand Sit to Stand: Max assist;+2 physical assistance;From elevated surface   General transfer comment: Pt able to stand to [facility] x3 trials with heavier L lean in standing this session, requires heavy maxA+2 to power up and to maintain standing. Unable to shift weight to R side today with cues Transfer via Lift Equipment: Stedy  Modified Rankin (Stroke Patients Only) Modified Rankin (Stroke Patients Only) Pre-Morbid Rankin Score: No significant disability Modified Rankin: Severe disability     Balance Overall balance assessment: Needs assistance Sitting-balance support: Feet supported;Single extremity supported Sitting balance-Leahy Scale: Poor Sitting balance - Comments: Sitting EOB requiring therapist behind to prevent posterior LOB, slightly improved this session with therapist able to stand in front eventually, also heavy lean to L that can be corrected short term with verbal cues Postural control: Posterior lean;Left lateral lean Standing balance support: Bilateral upper extremity supported Standing balance-Leahy Scale: Zero Standing balance comment: maxA+2 in Stedy with heavy L lean, unable to correct and shift weight to R this session      Cognition Arousal/Alertness: Awake/alert Behavior During Therapy: WFL for tasks assessed/performed Overall Cognitive Status: Within Functional Limits for tasks assessed   General Comments: Very motivated for therapy Functional Status Assessment: Patient has had a recent decline in their functional status and  demonstrates the ability to make significant improvements in  function in a reasonable and predictable amount of time.      Exercises Other Exercises Other Exercises: PROM L wrist flexion/extension x10 Other Exercises: PROM L knee in supine into flexion and extension x5    General Comments General comments (skin integrity, edema, etc.): VSS on RA, HR up to 100bpm post-exertion, 5/10 modified RPE at end of session      Pertinent Vitals/Pain Pain Assessment: No/denies pain Pain Score: 7  Pain Location: shoulder Pain Descriptors / Indicators: Aching Pain Intervention(s): Monitored during session     PT Goals (current goals can now be found in the care plan section) Acute Rehab PT Goals Patient Stated Goal: To get stronger to play golf again and be able to go to church PT Goal Formulation: With patient Time For Goal Achievement: 03/04/21 Progress towards PT goals: Progressing toward goals    Frequency    Min 4X/week      PT Plan Current plan remains appropriate       AM-PAC PT "6 Clicks" Mobility   Outcome Measure  Help needed turning from your back to your side while in a flat bed without using bedrails?: Total Help needed moving from lying on your back to sitting on the side of a flat bed without using bedrails?: Total Help needed moving to and from a bed to a chair (including a wheelchair)?: Total Help needed standing up from a chair using your arms (e.g., wheelchair or bedside chair)?: Total Help needed to walk in hospital room?: Total Help needed climbing 3-5 steps with a railing? : Total 6 Click Score: 6    End of Session Equipment Utilized During Treatment: Gait belt Activity Tolerance: Patient tolerated treatment well Patient left: in bed;with bed alarm set;with call bell/phone within reach Nurse Communication: Mobility status;Need for lift equipment;Other (comment) (Pt may want to speak to Chaplain, instructed to tell nursing if he decides he wants to) PT  Visit Diagnosis: Unsteadiness on feet (R26.81);Hemiplegia and hemiparesis Hemiplegia - Right/Left: Left Hemiplegia - dominant/non-dominant: Non-dominant Hemiplegia - caused by: Cerebral infarction     Time: 5993-5701 PT Time Calculation (min) (ACUTE ONLY): 29 min  Charges:  $Therapeutic Exercise: 8-22 mins $Therapeutic Activity: 8-22 mins                     Harland German, Student PTA CI: Carly P., PTA  Carly M Poff 02/25/2021, 2:58 PM

## 2021-02-25 NOTE — Evaluation (Signed)
Clinical/Bedside Swallow Evaluation Patient Details  Name: Brent Young MRN: [id number] Date of Birth: 07/05/1960  Today's Date: 02/25/2021 Time: SLP Start Time (ACUTE ONLY): 1030 SLP Stop Time (ACUTE ONLY): 1105 SLP Time Calculation (min) (ACUTE ONLY): 35 min  Past Medical History:  Past Medical History:  Diagnosis Date   Allergy    Arthritis    Diabetes mellitus without complication (HCC)    Hypertension    Stroke [facility]    Past Surgical History:  Past Surgical History:  Procedure Laterality Date   FRACTURE SURGERY Left    Patient fractured left arm   IR ANGIO INTRA EXTRACRAN SEL COM CAROTID INNOMINATE UNI L MOD SED  02/19/2021   IR ANGIO INTRA EXTRACRAN SEL INTERNAL CAROTID UNI R MOD SED  02/19/2021   IR ANGIO VERTEBRAL SEL SUBCLAVIAN INNOMINATE UNI R MOD SED  02/19/2021   IR ANGIO VERTEBRAL SEL VERTEBRAL UNI L MOD SED  02/19/2021   IR US GUIDE VASC ACCESS RIGHT  02/19/2021   HPI:  Pt is a 60 year old male admitted with Recurrent acute CVA, right vertebral artery stenosis on background extensive small vessel ischemic cerebrovascular disease, history of L basal ganglia and hippocampal ICH: MRI shows Right basal ganglia, corona radiata, with left hemiparesis, dysphagia. Pt had modified barium swallow (MBS) at [facility] outpatient services on 03-12-20 showing mild pharyngeal dysphagia with Delayed initiation of the swallow with delayed epiglottic inversion. Reduced vestibular closure. Intermittent backflow of barium into the pyriform sinuses contributing up to min residue after the swallow. Frequent penetration occured, no history of pulmonary problems.    Assessment / Plan / Recommendation  Clinical Impression  Pt seen for reassessment of swallow function and safety due to recent difficulty with dinner meal. Pt reports he was given meat that was too tough, and was not able to manage it. Pt continues to present with unremarkable CN exam and strong cough. Pt was observed with thin  liquids, puree, and solid textures at bedside today, without overt s/s aspiration on any consistency. Given left upper extremity weakness, recommend mechanical soft diet with chopped meats, thin liquids. Will follow up once to insure pt tolerating dys 3 diet.  SLP Visit Diagnosis: Dysphagia, unspecified (R13.10)    Aspiration Risk  Mild aspiration risk    Diet Recommendation Dysphagia 3 (Mech soft);Thin liquid   Liquid Administration via: Cup;Straw Medication Administration: Whole meds with liquid Supervision: Patient able to self feed Compensations: Slow rate;Small sips/bites;Minimize environmental distractions Postural Changes: Seated upright at 90 degrees    Other  Recommendations Oral Care Recommendations: Oral care BID    Recommendations for follow up therapy are one component of a multi-disciplinary discharge planning process, led by the attending physician.  Recommendations may be updated based on patient status, additional functional criteria and insurance authorization.  Follow up Recommendations No SLP follow up      Assistance Recommended at Discharge None  Functional Status Assessment Patient has had a recent decline in their functional status and demonstrates the ability to make significant improvements in function in a reasonable and predictable amount of time.  Frequency and Duration min 1 x/week  1 week       Prognosis Prognosis for Safe Diet Advancement: Good      Swallow Study   General Date of Onset: 02/16/21 HPI: Pt is a 60 year old male admitted with Recurrent acute CVA, right vertebral artery stenosis on background extensive small vessel ischemic cerebrovascular disease, history of L basal ganglia and hippocampal ICH: MRI shows  Right basal ganglia, corona radiata, with left hemiparesis, dysphagia. Pt had modified barium swallow (MBS) at [facility] outpatient services on 03-12-20 showing mild pharyngeal dysphagia with Delayed initiation of the swallow with delayed  epiglottic inversion. Reduced vestibular closure. Intermittent backflow of barium into the pyriform sinuses contributing up to min residue after the swallow. Frequent penetration occured, no history of pulmonary problems. Type of Study: Bedside Swallow Evaluation Previous Swallow Assessment: 02/18/21 BSE - rec Dys3/thin Diet Prior to this Study: Regular Temperature Spikes Noted: No Respiratory Status: Nasal cannula History of Recent Intubation: No Behavior/Cognition: Alert;Cooperative;Pleasant mood Oral Cavity Assessment: Within Functional Limits Oral Care Completed by SLP: No Oral Cavity - Dentition: Adequate natural dentition Vision: Functional for self-feeding Self-Feeding Abilities: Able to feed self Patient Positioning: Upright in bed Baseline Vocal Quality: Normal Volitional Cough: Strong Volitional Swallow: Able to elicit    Oral/Motor/Sensory Function Overall Oral Motor/Sensory Function: Within functional limits   Ice Chips Ice chips: Not tested   Thin Liquid Thin Liquid: Within functional limits Presentation: Cup    Nectar Thick Nectar Thick Liquid: Not tested   Honey Thick Honey Thick Liquid: Not tested   Puree Puree: Within functional limits   Solid     Solid: Within functional limits Presentation: Self Fed     Ricahrd Schwager B. Murvin Natal, [facility], CCC-SLP Speech Language Pathologist Office: [phone]  Leigh Aurora 02/25/2021,11:14 AM

## 2021-02-25 NOTE — Progress Notes (Signed)
Inpatient Rehab Admissions Coordinator:   Spoke to [facility] again on the phone to clarify coverage status.  We reviewed every possibility and patient does not have coverage through them at this time, nor would they be able to set up coverage as they are not accepting new members.  I have reached out to Financial Counseling to see if pt could be screened for Medicaid and I have spoken to pt and son about looking at new policies on the market place, if they wish.  They are going to discuss and get back to me this afternoon or in the morning.  I let them know that I can bring patient without insurance, I just need to know are we waiting on Medicaid or are we going for a commercial plan.    Estill Dooms, PT, DPT Admissions Coordinator [phone] 02/25/21  12:23 PM

## 2021-02-25 NOTE — Progress Notes (Signed)
PROGRESS NOTE  Brent Young HKV:[id number] DOB: 07-Feb-1961 DOA: 02/16/2021 PCP: Kerin Perna, NP   LOS: 8 days   Brief Narrative / Interim history: 60 year old male with prior history of lacunar CVAs, arthritis, DM2, HTN, obesity class II, who comes to the hospital on 11/27 as a code stroke with left arm and left leg weakness.  This happened upon waking up, last known well was at 10 PM the night prior.  Neurology evaluated in the ED, an MRI demonstrated acute-subacute infarcts in the right basal ganglia and corona radiata on background of extensive chronic small vessel ischemia.  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  Neuro interventional IR was also consulted and underwent cerebral angiogram  Subjective Patient without any new complaints this morning.  Yesterday he had 2 instances where he felt like he was choking on his meals but he tells me that he was in a reclining/lying position.  He was told that he needs to be upright while consuming any meals or drinking any fluids.     Assessment & Plan:   Recurrent Acute to subacute perforator infarcts at the right basal ganglia and corona radiata due to right ICA siphon severe stenosis  Patient seen by neurology.   CT angiogram showed right V4 likely occluded proximal to the takeoff of the right PICA with no definitive flow seen in the right PICA which is new from prior exam.  He also has significant extensive intracranial atherosclerosis.   He underwent a 2D echocardiogram showed an EF of 60-65%.   LDL was 98 and he is on atorvastatin, dose increased from 10 to 40 mg daily.   A1c was 9.9.  Urine drug screen was positive for THC.   Interventional radiology consulted by neurology for cerebral angiogram.  Cerebral angiogram showed left ICA 65-70% stenosis, bilateral  ICAs-mild to moderate stenosis, right VA severe stenosis.   Neurology recommends aspirin 325 and Plavix 75 DAPT for 3 months then aspirin alone.   Await CIR placement.  Neurological status is stable. Patient educated on aspiration precautions.  Essential hypertension BP goal for him is 130-160 given severe intravascular stenosis.   Continue amlodipine, irbesartan, metoprolol.   Blood pressure is stable.  Pulmonary edema Noted to be dyspneic on 12/2.  Faint crackles bibasilarly, chest x-ray showed mild fluid overload.  Received Lasix x1, responded well.  Seems to be stable from a respiratory standpoint.   Currently on 2 L of oxygen by nasal cannula.  Wean down as tolerated.  Echocardiogram showed normal systolic function  Diabetes mellitus type 2, uncontrolled with hyperglycemia HbA1c 9.9.  Linagliptin being continued along with SSI.  Metformin was initiated yesterday.  Continue to monitor CBGs.  Was also on glipizide at home which can also be resumed eventually.  Right shoulder pain Consistent with mild AC joint arthritis possibly from falling and overuse. XR without fracture or dislocation.  Supportive management  Tobacco use  Cessation counseling provided   Demand myocardial ischemia High-sensitivity troponin flat, not in a pattern consistent with ACS.  No chest pain   Hypokalemia  Continue to monitor  Hyperlipidemia Continue atorvastatin 40 mg  Leukocytosis  Suspect reactive to CVA. Resolved without abx.     Obesity, class II Estimated body mass index is 36.28 kg/m as calculated from the following:   Height as of this  encounter: _0  (1.854 m).   Weight as of this encounter: 124.7 kg.   DVT prophylaxis: Lovenox CODE STATUS: Full code Family communication: Discussed with patient and his son who was at the bedside. Disposition: Waiting on inpatient rehabilitation  Status is: Inpatient  Remains inpatient appropriate because: Acute stroke        Scheduled  Meds:   stroke: mapping our early stages of recovery book   Does not apply Once   acetaminophen  1,000 mg Oral Once   amLODipine  10 mg Oral Daily   aspirin EC  325 mg Oral Daily   atorvastatin  40 mg Oral Daily   clopidogrel  75 mg Oral Daily   enoxaparin (LOVENOX) injection  40 mg Subcutaneous Q24H   gabapentin  300 mg Oral TID   insulin aspart  0-9 Units Subcutaneous TID WC   insulin starter kit- pen needles  1 kit Other Once   irbesartan  75 mg Oral Daily   linagliptin  5 mg Oral Daily   living well with diabetes book   Does not apply Once   mouth rinse  15 mL Mouth Rinse BID   metFORMIN  1,000 mg Oral BID WC   metoprolol succinate  25 mg Oral Daily   Oxcarbazepine  300 mg Oral TID   Continuous Infusions: PRN Meds:.acetaminophen **OR** acetaminophen (TYLENOL) oral liquid 160 mg/5 mL **OR** acetaminophen, hydrALAZINE, oxyCODONE  Diet Orders (From admission, onward)     Start     Ordered   02/19/21 1528  Diet regular Room service appropriate? Yes; Fluid consistency: Thin  Diet effective now       Question Answer Comment  Room service appropriate? Yes   Fluid consistency: Thin      02/19/21 1527            Consultants:  Neurology, neuroIR  Procedures:  2D echo  Microbiology  none  Antimicrobials: none    Objective: Vitals:   02/24/21 1100 02/24/21 2056 02/25/21 0053 02/25/21 0713  BP: (!) 148/76 (!) 147/82 (!) 150/88 130/89  Pulse: 94 99 95 91  Resp: _1 Temp: 98.2 F (36.8 C) 98.3 F (36.8 C) 97.7 F (36.5 C) 97.8 F (36.6 C)  TempSrc: Oral Oral Oral Oral  SpO2: 98% 97% 96% 94%  Weight:      Height:        Intake/Output Summary (Last 24 hours) at 02/25/2021 1045 Last data filed at 02/25/2021 0600 Gross per 24 hour  Intake 240 ml  Output 920 ml  Net -680 ml    Filed Weights   02/16/21 2106  Weight: 124.7 kg    Examination:  General appearance: Awake alert.  In no distress Resp: Clear to auscultation bilaterally.  Normal  effort Cardio: S1-S2 is normal regular.  No S3-S4.  No rubs murmurs or bruit GI: Abdomen is soft.  Nontender nondistended.  Bowel sounds are present normal.  No masses organomegaly Extremities: No edema.   Neurologic: Left hemiparesis as before     Data Reviewed: I have independently reviewed following labs and imaging studies   CBC: Recent Labs  Lab 02/21/21 0354  WBC 9.0  HGB 13.7  HCT 41.9  MCV 93.9  PLT 845    Basic Metabolic Panel: Recent Labs  Lab 02/19/21 0043 02/21/21 0354 02/22/21 0842  NA 138 136 137  K 3.6 3.8 3.9  CL 100 100 96*  CO2 _2 GLUCOSE 229* 213* 178*  BUN 14 15  11  CREATININE 1.02 0.81 0.63  CALCIUM 8.9 8.9 9.3  MG 2.0  --   --      CBG: Recent Labs  Lab 02/24/21 1119 02/24/21 1319 02/24/21 1726 02/24/21 2138 02/25/21 0648  GLUCAP 190* 185* 180* 203* 179*     Recent Results (from the past 240 hour(s))  Resp Panel by RT-PCR (Flu A&B, Covid) Nasopharyngeal Swab     Status: None   Collection Time: 02/16/21  8:08 PM   Specimen: Nasopharyngeal Swab; Nasopharyngeal(NP) swabs in vial transport medium  Result Value Ref Range Status   SARS Coronavirus 2 by RT PCR NEGATIVE NEGATIVE Final    Comment: (NOTE) SARS-CoV-2 target nucleic acids are NOT DETECTED.  The SARS-CoV-2 RNA is generally detectable in upper respiratory specimens during the acute phase of infection. The lowest concentration of SARS-CoV-2 viral copies this assay can detect is 138 copies/mL. A negative result does not preclude SARS-Cov-2 infection and should not be used as the sole basis for treatment or other patient management decisions. A negative result may occur with  improper specimen collection/handling, submission of specimen other than nasopharyngeal swab, presence of viral mutation(s) within the areas targeted by this assay, and inadequate number of viral copies(<138 copies/mL). A negative result must be combined with clinical observations, patient  history, and epidemiological information. The expected result is Negative.  Fact Sheet for Patients:  EntrepreneurPulse.com.au  Fact Sheet for Healthcare Providers:  IncredibleEmployment.be  This test is no t yet approved or cleared by the [place] FDA and  has been authorized for detection and/or diagnosis of SARS-CoV-2 by FDA under an Emergency Use Authorization (EUA). This EUA will remain  in effect (meaning this test can be used) for the duration of the COVID-19 declaration under Section 564(b)(1) of the Act, 21 U.S.C.section 360bbb-3(b)(1), unless the authorization is terminated  or revoked sooner.       Influenza A by PCR NEGATIVE NEGATIVE Final   Influenza B by PCR NEGATIVE NEGATIVE Final    Comment: (NOTE) The Xpert Xpress SARS-CoV-2/FLU/RSV plus assay is intended as an aid in the diagnosis of influenza from Nasopharyngeal swab specimens and should not be used as a sole basis for treatment. Nasal washings and aspirates are unacceptable for Xpert Xpress SARS-CoV-2/FLU/RSV testing.  Fact Sheet for Patients: EntrepreneurPulse.com.au  Fact Sheet for Healthcare Providers: IncredibleEmployment.be  This test is not yet approved or cleared by the [place] FDA and has been authorized for detection and/or diagnosis of SARS-CoV-2 by FDA under an Emergency Use Authorization (EUA). This EUA will remain in effect (meaning this test can be used) for the duration of the COVID-19 declaration under Section 564(b)(1) of the Act, 21 U.S.C. section 360bbb-3(b)(1), unless the authorization is terminated or revoked.  Performed at [facility], [place] [address] [place], [place] [address]       Radiology Studies: No results found.  Bonnielee Haff 02/25/2021

## 2021-02-26 LAB — GLUCOSE, CAPILLARY
Glucose-Capillary: 157 mg/dL — ABNORMAL HIGH (ref 70–99)
Glucose-Capillary: 163 mg/dL — ABNORMAL HIGH (ref 70–99)
Glucose-Capillary: 106 mg/dL — ABNORMAL HIGH (ref 70–99)
Glucose-Capillary: 107 mg/dL — ABNORMAL HIGH (ref 70–99)

## 2021-02-26 NOTE — Plan of Care (Signed)
  Problem: Education: Goal: Knowledge of General Education information will improve Description: Including pain rating scale, medication(s)/side effects and non-pharmacologic comfort measures Outcome: Progressing   Problem: Health Behavior/Discharge Planning: Goal: Ability to manage health-related needs will improve Outcome: Progressing   Problem: Clinical Measurements: Goal: Will remain free from infection Outcome: Progressing   Problem: Clinical Measurements: Goal: Diagnostic test results will improve Outcome: Progressing   Problem: Activity: Goal: Risk for activity intolerance will decrease Outcome: Progressing

## 2021-02-26 NOTE — Progress Notes (Signed)
Inpatient Rehab Admissions Coordinator:   Stopped by to see pt but he was sleeping soundly.  Left message for son to discuss CIR.  I can accept him uninsured if pt/family agreeable.    Estill Dooms, PT, DPT Admissions Coordinator [phone] 02/26/21  4:21 PM

## 2021-02-26 NOTE — Progress Notes (Addendum)
Physical Therapy Treatment Patient Details Name: Brent Young MRN: [id number] DOB: [birth year] Today's Date: 02/26/2021   History of Present Illness Pt is a 60 y/o male presenting w/ LUE and LLE weakness. 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 PMH significant for lacunar CVA's, DM II, HTN.    PT Comments    Pt received in supine and eagerly agreeable to therapy session. Utilized Sara Plus lift for sit<>stand with emphasis on correcting left and posterior lean and left knee buckling with pt able to tolerate ~3 min of standing. Utilized Maximove mechanical lift to transfer pt to chair from bed as he was fatigued from initial standing trial. Pt performed PROM/AAROM to LUE with RUE including wrist and elbow flexion/extension. Continue to recommend AIR upon DC as pt is motivated to return to prior level of function. Pt continues to benefit from PT services to progress toward functional mobility goals.      Recommendations for follow up therapy are one component of a multi-disciplinary discharge planning process, led by the attending physician.  Recommendations may be updated based on patient status, additional functional criteria and insurance authorization.  Follow Up Recommendations  Acute inpatient rehab (3hours/day)     Assistance Recommended at Discharge Frequent or constant Supervision/Assistance  Equipment Recommendations  Wheelchair (measurements PT);Wheelchair cushion (measurements PT)    Recommendations for Other Services Rehab consult     Precautions / Restrictions Precautions Precautions: Fall Precaution Comments: BP goal 130-160 per neuro MD; Dense L side deficits Required Braces or Orthoses: Other Brace Other Brace: AFO for LLE Restrictions Weight Bearing Restrictions: No     Mobility  Bed Mobility Overal bed mobility: Needs Assistance Bed Mobility: Rolling;Sidelying to Sit;Sit to Sidelying Rolling: Mod assist;+2 for physical  assistance Sidelying to sit: Max assist;+2 for physical assistance    Sit to sidelying: Max assist;+2 for physical assistance General bed mobility comments: Pt modA to roll with improved initiation today, easier to roll to L than R, pt able to help initiate but needs assistance following through, assist with trunk and BLE management when going sidelying<>sit    Transfers Overall transfer level: Needs assistance Equipment used: Ambulation equipment used Transfers: Sit to/from Stand;Bed to chair/wheelchair/BSC Sit to Stand: Max assist;+2 physical assistance;From elevated surface   General transfer comment: Utilized Sara+ for STS, pt able to stand ~3 minutes, utilized maximove to transfer patient bed>chair as pt was fatigued after initial standing trial; cues for safe hand placement, heavy posterior and L lean requiring maxA+2 to correct Transfer via Lift Equipment: Maximove;H[place]ley Dec Lift  Modified Rankin (Stroke Patients Only) Modified Rankin (Stroke Patients Only) Pre-Morbid Rankin Score: No significant disability Modified Rankin: Severe disability     Balance Overall balance assessment: Needs assistance Sitting-balance support: Feet supported;Single extremity supported Sitting balance-Leahy Scale: Poor Sitting balance - Comments: Heavy lean to L that can be corrected short term with verbal cues Postural control: Posterior lean;Left lateral lean Standing balance support: Bilateral upper extremity supported Standing balance-Leahy Scale: Zero Standing balance comment: maxA+2 in Sara+ with heavy L and posterior lean      Cognition Arousal/Alertness: Awake/alert Behavior During Therapy: WFL for tasks assessed/performed Overall Cognitive Status: Within Functional Limits for tasks assessed   General Comments: Very motivated for therapy      Exercises Other Exercises Other Exercises: PROM L wrist flexion/extension x10 Other Exercises: PROM L knee in supine into flexion and extension  x10    General Comments  BP within guidelines per vitals flowsheet and HR  was <100 bpm with exertion      Pertinent Vitals/Pain Pain Assessment: Faces Faces Pain Scale: Hurts little more Pain Location: R shoulder Pain Intervention(s): Monitored during session     PT Goals (current goals can now be found in the care plan section) Acute Rehab PT Goals Patient Stated Goal: To get stronger to play golf again and be able to go to church PT Goal Formulation: With patient Time For Goal Achievement: 03/04/21 Progress towards PT goals: Progressing toward goals    Frequency    Min 4X/week      PT Plan Current plan remains appropriate       AM-PAC PT "6 Clicks" Mobility   Outcome Measure  Help needed turning from your back to your side while in a flat bed without using bedrails?: Total Help needed moving from lying on your back to sitting on the side of a flat bed without using bedrails?: Total Help needed moving to and from a bed to a chair (including a wheelchair)?: Total Help needed standing up from a chair using your arms (e.g., wheelchair or bedside chair)?: Total Help needed to walk in hospital room?: Total Help needed climbing 3-5 steps with a railing? : Total 6 Click Score: 6    End of Session Equipment Utilized During Treatment: Gait belt Activity Tolerance: Patient tolerated treatment well Patient left: with call bell/phone within reach;in chair;with chair alarm set Nurse Communication: Mobility status;Need for lift equipment;Other (comment) (Utilize maximove to return pt to bed) PT Visit Diagnosis: Unsteadiness on feet (R26.81);Hemiplegia and hemiparesis Hemiplegia - Right/Left: Left Hemiplegia - dominant/non-dominant: Non-dominant Hemiplegia - caused by: Cerebral infarction     Time: 1027-1118 PT Time Calculation (min) (ACUTE ONLY): 51 min  Charges:  $Therapeutic Exercise: 23-37 mins $Therapeutic Activity: 8-22 mins                     Harland German,  Student PTA CI: Carly P., PTA  Harland German 02/26/2021, 11:41 AM

## 2021-02-26 NOTE — Progress Notes (Signed)
PROGRESS NOTE  Brent Young OEV:[id number] DOB: [birth year] DOA: 02/16/2021 PCP: Kerin Perna, NP   LOS: 9 days   Brief Narrative / Interim history: 60 year old male with prior history of lacunar CVAs, arthritis, DM2, HTN, obesity class II, who comes to the hospital on 11/27 as a code stroke with left arm and left leg weakness.  This happened upon waking up, last known well was at 10 PM the night prior.  Neurology evaluated in the ED, an MRI demonstrated acute-subacute infarcts in the right basal ganglia and corona radiata on background of extensive chronic small vessel ischemia.  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  Neuro interventional IR was also consulted and underwent cerebral angiogram  Subjective Patient without any complaints this morning.  Seems a little bit sad due to his overall situation.  Continues to have left-sided weakness.     Assessment & Plan:   Recurrent Acute to subacute perforator infarcts at the right basal ganglia and corona radiata due to right ICA siphon severe stenosis  Patient seen by neurology.   CT angiogram showed right V4 likely occluded proximal to the takeoff of the right PICA with no definitive flow seen in the right PICA which is new from prior exam.  He also has significant extensive intracranial atherosclerosis.   He underwent a 2D echocardiogram showed an EF of 60-65%.   LDL was 98 and he is on atorvastatin, dose increased from 10 to 40 mg daily.   A1c was 9.9.  Urine drug screen was positive for THC.   Interventional radiology consulted by neurology for cerebral angiogram.  Cerebral angiogram showed left ICA 65-70% stenosis, bilateral ICAs-mild to moderate stenosis, right VA severe stenosis.   Neurology recommends aspirin 325 and Plavix 75 DAPT for 3 months then aspirin  alone.   On dysphagia 2 diet. Continues to have left-sided weakness but stable.  Waiting on inpatient rehabilitation..  Essential hypertension BP goal for him is 130-160 given severe intravascular stenosis.   Continue amlodipine, irbesartan, metoprolol.   Blood pressure is stable.  Pulmonary edema Noted to be dyspneic on 12/2.  Faint crackles bibasilarly, chest x-ray showed mild fluid overload.  Received Lasix x1, responded well.   Currently on 2 L of oxygen by nasal cannula.  Wean down as tolerated.  Echocardiogram showed normal systolic function Respiratory status is stable.  Diabetes mellitus type 2, uncontrolled with hyperglycemia HbA1c 9.9.  Patient is on linagliptin as well as metformin.  Also on SSI.  CBGs seem to be better.  Was also on glipizide at home which can also be resumed eventually.  Right shoulder pain Consistent with mild AC joint arthritis possibly from falling and overuse. XR without fracture or dislocation.  Supportive management  Tobacco use  Cessation counseling provided   Demand myocardial ischemia High-sensitivity troponin flat, not in a pattern consistent with ACS.  No chest pain   Hypokalemia  Was repleted.  Check periodically.  Hyperlipidemia LDL was 98. Continue atorvastatin 40 mg  Leukocytosis   Likely reactive.  Resolved.      Obesity, class II Estimated body mass index is 36.28 kg/m as calculated from the following:   Height as of this encounter: 6' 1"  (1.854 m).   Weight as of this encounter: 124.7 kg.   DVT prophylaxis: Lovenox CODE STATUS: Full code Family communication:  Discussed with patient Disposition: Waiting on inpatient rehabilitation  Status is: Inpatient  Remains inpatient appropriate because: Acute stroke        Scheduled Meds:   stroke: mapping our early stages of recovery book   Does not apply Once   acetaminophen  1,000 mg Oral Once   amLODipine  10 mg Oral Daily   aspirin EC  325 mg Oral Daily    atorvastatin  40 mg Oral Daily   clopidogrel  75 mg Oral Daily   enoxaparin (LOVENOX) injection  40 mg Subcutaneous Q24H   gabapentin  300 mg Oral TID   insulin aspart  0-9 Units Subcutaneous TID WC   insulin starter kit- pen needles  1 kit Other Once   irbesartan  75 mg Oral Daily   linagliptin  5 mg Oral Daily   living well with diabetes book   Does not apply Once   mouth rinse  15 mL Mouth Rinse BID   metFORMIN  1,000 mg Oral BID WC   metoprolol succinate  25 mg Oral Daily   Oxcarbazepine  300 mg Oral TID   Continuous Infusions: PRN Meds:.acetaminophen **OR** acetaminophen (TYLENOL) oral liquid 160 mg/5 mL **OR** acetaminophen, hydrALAZINE, oxyCODONE  Diet Orders (From admission, onward)     Start     Ordered   02/26/21 0844  DIET DYS 2 Room service appropriate? Yes; Fluid consistency: Thin  Diet effective now       Question Answer Comment  Room service appropriate? Yes   Fluid consistency: Thin      02/26/21 0843            Consultants:  Neurology, neuroIR  Procedures:  2D echo  Microbiology  none  Antimicrobials: none    Objective: Vitals:   02/25/21 2000 02/26/21 0036 02/26/21 0430 02/26/21 0700  BP: 132/71 137/78 126/79 135/79  Pulse: 94 90 86 88  Resp: 20 18 20 19   Temp: 98.1 F (36.7 C) 99 F (37.2 C) 98.7 F (37.1 C) 97.8 F (36.6 C)  TempSrc: Oral Oral Oral Oral  SpO2: 94% 94% 97% 93%  Weight:      Height:        Intake/Output Summary (Last 24 hours) at 02/26/2021 1125 Last data filed at 02/25/2021 2244 Gross per 24 hour  Intake --  Output 100 ml  Net -100 ml    Filed Weights   02/16/21 2106  Weight: 124.7 kg    Examination:  General appearance: Awake alert.  In no distress Resp: Clear to auscultation bilaterally.  Normal effort Cardio: S1-S2 is normal regular.  No S3-S4.  No rubs murmurs or bruit GI: Abdomen is soft.  Nontender nondistended.  Bowel sounds are present normal.  No masses organomegaly Extremities: No edema.   Full range of motion of lower extremities. Neurologic: Left hemiparesis as before     Data Reviewed: I have independently reviewed following labs and imaging studies   CBC: Recent Labs  Lab 02/21/21 0354  WBC 9.0  HGB 13.7  HCT 41.9  MCV 93.9  PLT 301    Basic Metabolic Panel: Recent Labs  Lab 02/21/21 0354 02/22/21 0842  NA 136 137  K 3.8 3.9  CL 100 96*  CO2 30 31  GLUCOSE 213* 178*  BUN 15 11  CREATININE 0.81 0.63  CALCIUM 8.9 9.3     CBG: Recent Labs  Lab 02/24/21 2138 02/25/21 0648 02/25/21 1155 02/25/21 1618 02/26/21 0838  GLUCAP 203* 179* 167* 137* 157*  Recent Results (from the past 240 hour(s))  Resp Panel by RT-PCR (Flu A&B, Covid) Nasopharyngeal Swab     Status: None   Collection Time: 02/16/21  8:08 PM   Specimen: Nasopharyngeal Swab; Nasopharyngeal(NP) swabs in vial transport medium  Result Value Ref Range Status   SARS Coronavirus 2 by RT PCR NEGATIVE NEGATIVE Final    Comment: (NOTE) SARS-CoV-2 target nucleic acids are NOT DETECTED.  The SARS-CoV-2 RNA is generally detectable in upper respiratory specimens during the acute phase of infection. The lowest concentration of SARS-CoV-2 viral copies this assay can detect is 138 copies/mL. A negative result does not preclude SARS-Cov-2 infection and should not be used as the sole basis for treatment or other patient management decisions. A negative result may occur with  improper specimen collection/handling, submission of specimen other than nasopharyngeal swab, presence of viral mutation(s) within the areas targeted by this assay, and inadequate number of viral copies(<138 copies/mL). A negative result must be combined with clinical observations, patient history, and epidemiological information. The expected result is Negative.  Fact Sheet for Patients:  EntrepreneurPulse.com.au  Fact Sheet for Healthcare Providers:   IncredibleEmployment.be  This test is no t yet approved or cleared by the [place] FDA and  has been authorized for detection and/or diagnosis of SARS-CoV-2 by FDA under an Emergency Use Authorization (EUA). This EUA will remain  in effect (meaning this test can be used) for the duration of the COVID-19 declaration under Section 564(b)(1) of the Act, 21 U.S.C.section 360bbb-3(b)(1), unless the authorization is terminated  or revoked sooner.       Influenza A by PCR NEGATIVE NEGATIVE Final   Influenza B by PCR NEGATIVE NEGATIVE Final    Comment: (NOTE) The Xpert Xpress SARS-CoV-2/FLU/RSV plus assay is intended as an aid in the diagnosis of influenza from Nasopharyngeal swab specimens and should not be used as a sole basis for treatment. Nasal washings and aspirates are unacceptable for Xpert Xpress SARS-CoV-2/FLU/RSV testing.  Fact Sheet for Patients: EntrepreneurPulse.com.au  Fact Sheet for Healthcare Providers: IncredibleEmployment.be  This test is not yet approved or cleared by the [place] FDA and has been authorized for detection and/or diagnosis of SARS-CoV-2 by FDA under an Emergency Use Authorization (EUA). This EUA will remain in effect (meaning this test can be used) for the duration of the COVID-19 declaration under Section 564(b)(1) of the Act, 21 U.S.C. section 360bbb-3(b)(1), unless the authorization is terminated or revoked.  Performed at [facility], [place] [address] [place], [place] [address]       Radiology Studies: No results found.  Bonnielee Haff 02/26/2021

## 2021-02-26 NOTE — Progress Notes (Signed)
SLP Consult Note  Patient Details Name: BEECHER FURIO MRN: [id number] DOB: Jan 24, 1961  Pt seen at bedside to reassess tolerance of diet changed yesterday to Dys3. Pt reports he was unable to manage dinner due to meats being too large to manage. Discussed change to Dys2 diet, and pt would like to try this. Will follow up for assessment of diet tolerance of Dys2 diet.  Percy Comp B. Murvin Natal, [facility], CCC-SLP Speech Language Pathologist Office: [phone]  Leigh Aurora 02/26/2021, 8:44 AM

## 2021-02-27 ENCOUNTER — Encounter (HOSPITAL_COMMUNITY): Payer: Self-pay | Admitting: Physical Medicine & Rehabilitation

## 2021-02-27 ENCOUNTER — Other Ambulatory Visit: Payer: Self-pay

## 2021-02-27 ENCOUNTER — Inpatient Hospital Stay (HOSPITAL_COMMUNITY)
Admission: RE | Admit: 2021-02-27 | Discharge: 2021-04-03 | DRG: 057 | Disposition: A | Discharge status: Skilled Nursing Facility | Payer: Medicaid Other | Source: Intra-hospital | Attending: Physical Medicine & Rehabilitation | Admitting: Physical Medicine & Rehabilitation

## 2021-02-27 ENCOUNTER — Inpatient Hospital Stay (HOSPITAL_COMMUNITY): Payer: Medicaid Other

## 2021-02-27 DIAGNOSIS — I2510 Atherosclerotic heart disease of native coronary artery without angina pectoris: Secondary | ICD-10-CM | POA: Diagnosis present

## 2021-02-27 DIAGNOSIS — G4733 Obstructive sleep apnea (adult) (pediatric): Secondary | ICD-10-CM | POA: Diagnosis present

## 2021-02-27 DIAGNOSIS — M25511 Pain in right shoulder: Secondary | ICD-10-CM | POA: Diagnosis not present

## 2021-02-27 DIAGNOSIS — I69354 Hemiplegia and hemiparesis following cerebral infarction affecting left non-dominant side: Secondary | ICD-10-CM | POA: Diagnosis present

## 2021-02-27 DIAGNOSIS — E785 Hyperlipidemia, unspecified: Secondary | ICD-10-CM | POA: Diagnosis present

## 2021-02-27 DIAGNOSIS — F17210 Nicotine dependence, cigarettes, uncomplicated: Secondary | ICD-10-CM | POA: Diagnosis present

## 2021-02-27 DIAGNOSIS — Z7984 Long term (current) use of oral hypoglycemic drugs: Secondary | ICD-10-CM

## 2021-02-27 DIAGNOSIS — Z20822 Contact with and (suspected) exposure to covid-19: Secondary | ICD-10-CM | POA: Diagnosis present

## 2021-02-27 DIAGNOSIS — Z800 Family history of malignant neoplasm of digestive organs: Secondary | ICD-10-CM

## 2021-02-27 DIAGNOSIS — R0902 Hypoxemia: Secondary | ICD-10-CM | POA: Diagnosis not present

## 2021-02-27 DIAGNOSIS — I10 Essential (primary) hypertension: Secondary | ICD-10-CM | POA: Diagnosis present

## 2021-02-27 DIAGNOSIS — E119 Type 2 diabetes mellitus without complications: Secondary | ICD-10-CM | POA: Diagnosis present

## 2021-02-27 DIAGNOSIS — W050XXA Fall from non-moving wheelchair, initial encounter: Secondary | ICD-10-CM | POA: Diagnosis not present

## 2021-02-27 DIAGNOSIS — R0602 Shortness of breath: Secondary | ICD-10-CM

## 2021-02-27 DIAGNOSIS — W19XXXA Unspecified fall, initial encounter: Secondary | ICD-10-CM

## 2021-02-27 DIAGNOSIS — Z6835 Body mass index (BMI) 35.0-35.9, adult: Secondary | ICD-10-CM

## 2021-02-27 DIAGNOSIS — J811 Chronic pulmonary edema: Secondary | ICD-10-CM | POA: Diagnosis present

## 2021-02-27 DIAGNOSIS — R040 Epistaxis: Secondary | ICD-10-CM | POA: Diagnosis not present

## 2021-02-27 DIAGNOSIS — Z79899 Other long term (current) drug therapy: Secondary | ICD-10-CM

## 2021-02-27 DIAGNOSIS — S80212A Abrasion, left knee, initial encounter: Secondary | ICD-10-CM | POA: Diagnosis not present

## 2021-02-27 DIAGNOSIS — I639 Cerebral infarction, unspecified: Secondary | ICD-10-CM | POA: Diagnosis present

## 2021-02-27 DIAGNOSIS — M549 Dorsalgia, unspecified: Secondary | ICD-10-CM | POA: Diagnosis not present

## 2021-02-27 DIAGNOSIS — Y92239 Unspecified place in hospital as the place of occurrence of the external cause: Secondary | ICD-10-CM | POA: Diagnosis not present

## 2021-02-27 DIAGNOSIS — E669 Obesity, unspecified: Secondary | ICD-10-CM | POA: Diagnosis present

## 2021-02-27 DIAGNOSIS — I69322 Dysarthria following cerebral infarction: Secondary | ICD-10-CM

## 2021-02-27 DIAGNOSIS — K5900 Constipation, unspecified: Secondary | ICD-10-CM | POA: Diagnosis present

## 2021-02-27 DIAGNOSIS — R1310 Dysphagia, unspecified: Secondary | ICD-10-CM | POA: Diagnosis present

## 2021-02-27 LAB — BASIC METABOLIC PANEL
Anion gap: 7 (ref 5–15)
BUN: 15 mg/dL (ref 8–23)
CO2: 33 mmol/L — ABNORMAL HIGH (ref 22–32)
Calcium: 9.1 mg/dL (ref 8.9–10.3)
Chloride: 97 mmol/L — ABNORMAL LOW (ref 98–111)
Creatinine, Ser: 0.68 mg/dL (ref 0.61–1.24)
GFR, Estimated: >60 mL/min (ref >60)
Glucose, Bld: 101 mg/dL — ABNORMAL HIGH (ref 70–99)
Potassium: 3.6 mmol/L (ref 3.5–5.1)
Sodium: 137 mmol/L (ref 135–145)

## 2021-02-27 LAB — GLUCOSE, CAPILLARY
Glucose-Capillary: 106 mg/dL — ABNORMAL HIGH (ref 70–99)
Glucose-Capillary: 128 mg/dL — ABNORMAL HIGH (ref 70–99)
Glucose-Capillary: 96 mg/dL (ref 70–99)
Glucose-Capillary: 140 mg/dL — ABNORMAL HIGH (ref 70–99)

## 2021-02-27 LAB — CBC
HCT: 39.9 % (ref 39.0–52.0)
Hemoglobin: 12.8 g/dL — ABNORMAL LOW (ref 13.0–17.0)
MCH: 30.3 pg (ref 26.0–34.0)
MCHC: 32.1 g/dL (ref 30.0–36.0)
MCV: 94.3 fL (ref 80.0–100.0)
Platelets: 262 10*3/uL (ref 150–400)
RBC: 4.23 MIL/uL (ref 4.22–5.81)
RDW: 15.5 % (ref 11.5–15.5)
WBC: 8.1 10*3/uL (ref 4.0–10.5)
nRBC: 0 % (ref 0.0–0.2)

## 2021-02-27 MED ORDER — ACETAMINOPHEN 325 MG PO TABS
650.0000 mg | ORAL_TABLET | ORAL | Status: DC | PRN
  Administered 2021-03-02 – 2021-04-02 (×37): 650 mg via ORAL
  Filled 2021-02-27 (×38): qty 2

## 2021-02-27 MED ORDER — ENOXAPARIN SODIUM 40 MG/0.4ML IJ SOSY
40.0000 mg | PREFILLED_SYRINGE | INTRAMUSCULAR | Status: DC
  Administered 2021-02-27 – 2021-03-23 (×25): 40 mg via SUBCUTANEOUS
  Filled 2021-02-27 (×25): qty 0.4

## 2021-02-27 MED ORDER — METOPROLOL SUCCINATE ER 25 MG PO TB24
25.0000 mg | ORAL_TABLET | Freq: Every day | ORAL | Status: DC
  Administered 2021-02-28 – 2021-04-03 (×35): 25 mg via ORAL
  Filled 2021-02-27 (×35): qty 1

## 2021-02-27 MED ORDER — IRBESARTAN 75 MG PO TABS
75.0000 mg | ORAL_TABLET | Freq: Every day | ORAL | Status: DC
  Administered 2021-02-28 – 2021-04-03 (×35): 75 mg via ORAL
  Filled 2021-02-27 (×35): qty 1

## 2021-02-27 MED ORDER — METFORMIN HCL 500 MG PO TABS
1000.0000 mg | ORAL_TABLET | Freq: Two times a day (BID) | ORAL | Status: DC
  Administered 2021-02-27 – 2021-04-03 (×71): 1000 mg via ORAL
  Filled 2021-02-27 (×71): qty 2

## 2021-02-27 MED ORDER — CLOPIDOGREL BISULFATE 75 MG PO TABS
75.0000 mg | ORAL_TABLET | Freq: Every day | ORAL | Status: DC
  Administered 2021-02-28 – 2021-04-03 (×35): 75 mg via ORAL
  Filled 2021-02-27 (×35): qty 1

## 2021-02-27 MED ORDER — ATORVASTATIN CALCIUM 40 MG PO TABS
40.0000 mg | ORAL_TABLET | Freq: Every day | ORAL | Status: DC
  Administered 2021-02-28 – 2021-04-03 (×35): 40 mg via ORAL
  Filled 2021-02-27 (×35): qty 1

## 2021-02-27 MED ORDER — INSULIN ASPART 100 UNIT/ML IJ SOLN
0.0000 [IU] | Freq: Three times a day (TID) | INTRAMUSCULAR | Status: DC
  Administered 2021-02-28: 2 [IU] via SUBCUTANEOUS
  Administered 2021-02-28 – 2021-03-15 (×15): 1 [IU] via SUBCUTANEOUS
  Administered 2021-03-15: 07:00:00 2 [IU] via SUBCUTANEOUS
  Administered 2021-03-18 – 2021-03-26 (×5): 1 [IU] via SUBCUTANEOUS

## 2021-02-27 MED ORDER — POTASSIUM CHLORIDE CRYS ER 20 MEQ PO TBCR: 20.0000 meq | EXTENDED_RELEASE_TABLET | Freq: Every day | ORAL | Status: DC

## 2021-02-27 MED ORDER — AMLODIPINE BESYLATE 10 MG PO TABS
10.0000 mg | ORAL_TABLET | Freq: Every day | ORAL | Status: DC
  Administered 2021-02-28 – 2021-04-03 (×35): 10 mg via ORAL
  Filled 2021-02-27 (×35): qty 1

## 2021-02-27 MED ORDER — ENOXAPARIN SODIUM 40 MG/0.4ML IJ SOSY: 40.0000 mg | PREFILLED_SYRINGE | INTRAMUSCULAR | Status: DC

## 2021-02-27 MED ORDER — OXCARBAZEPINE 300 MG PO TABS
300.0000 mg | ORAL_TABLET | Freq: Three times a day (TID) | ORAL | Status: DC
  Administered 2021-02-27 – 2021-04-03 (×101): 300 mg via ORAL
  Filled 2021-02-27 (×108): qty 1

## 2021-02-27 MED ORDER — ASPIRIN EC 325 MG PO TBEC
325.0000 mg | DELAYED_RELEASE_TABLET | Freq: Every day | ORAL | Status: DC
  Administered 2021-02-28 – 2021-04-03 (×35): 325 mg via ORAL
  Filled 2021-02-27 (×35): qty 1

## 2021-02-27 MED ORDER — ACETAMINOPHEN 160 MG/5ML PO SOLN: 650.0000 mg | ORAL | Status: DC | PRN

## 2021-02-27 MED ORDER — POTASSIUM CHLORIDE CRYS ER 20 MEQ PO TBCR
40.0000 meq | EXTENDED_RELEASE_TABLET | Freq: Once | ORAL | Status: AC
  Administered 2021-02-27: 40 meq via ORAL
  Filled 2021-02-27: qty 2

## 2021-02-27 MED ORDER — GABAPENTIN 300 MG PO CAPS
300.0000 mg | ORAL_CAPSULE | Freq: Three times a day (TID) | ORAL | Status: DC
  Administered 2021-02-27 – 2021-04-03 (×107): 300 mg via ORAL
  Filled 2021-02-27 (×107): qty 1

## 2021-02-27 MED ORDER — FUROSEMIDE 10 MG/ML IJ SOLN
40.0000 mg | Freq: Once | INTRAMUSCULAR | Status: AC
  Administered 2021-02-27: 40 mg via INTRAVENOUS
  Filled 2021-02-27: qty 4

## 2021-02-27 MED ORDER — FUROSEMIDE 20 MG PO TABS: 20.0000 mg | ORAL_TABLET | Freq: Every day | ORAL | Status: DC

## 2021-02-27 MED ORDER — OXYCODONE HCL 5 MG PO TABS
5.0000 mg | ORAL_TABLET | Freq: Four times a day (QID) | ORAL | Status: DC | PRN
  Administered 2021-02-28 – 2021-03-04 (×11): 5 mg via ORAL
  Filled 2021-02-27 (×11): qty 1

## 2021-02-27 MED ORDER — LINAGLIPTIN 5 MG PO TABS
5.0000 mg | ORAL_TABLET | Freq: Every day | ORAL | Status: DC
  Administered 2021-02-28 – 2021-04-03 (×35): 5 mg via ORAL
  Filled 2021-02-27 (×36): qty 1

## 2021-02-27 MED ORDER — ACETAMINOPHEN 650 MG RE SUPP: 650.0000 mg | RECTAL | Status: DC | PRN

## 2021-02-27 NOTE — Progress Notes (Signed)
Report called to [facility] on , patient to go to room 1. All questions answered.

## 2021-02-27 NOTE — Progress Notes (Signed)
Inpatient Rehab Admissions Coordinator:   I have a bed available for this patient to admit to CIR today and Dr. Rito Ehrlich in agreement.  I discussed, at length, prospect of coming as self pay with patient and his son and both are in agreement to not delay transition to rehab any longer.  Touched base with financial counseling re status of medicaid referral.  I will plan admit today.  TOC aware.    Estill Dooms, PT, DPT Admissions Coordinator [phone] 02/27/21  11:05 AM

## 2021-02-27 NOTE — Plan of Care (Signed)
Pt is alert oriented x 4. Left sided weakness. Pt uses urinal. Pt is ready to begin therapy.   Problem: Education: Goal: Knowledge of General Education information will improve Description: Including pain rating scale, medication(s)/side effects and non-pharmacologic comfort measures Outcome: Progressing   Problem: Health Behavior/Discharge Planning: Goal: Ability to manage health-related needs will improve Outcome: Progressing   Problem: Clinical Measurements: Goal: Will remain free from infection Outcome: Progressing Goal: Diagnostic test results will improve Outcome: Progressing Goal: Respiratory complications will improve Outcome: Progressing Goal: Cardiovascular complication will be avoided Outcome: Progressing   Problem: Activity: Goal: Risk for activity intolerance will decrease Outcome: Progressing   Problem: Nutrition: Goal: Adequate nutrition will be maintained Outcome: Progressing   Problem: Coping: Goal: Level of anxiety will decrease Outcome: Progressing   Problem: Elimination: Goal: Will not experience complications related to bowel motility Outcome: Progressing Goal: Will not experience complications related to urinary retention Outcome: Progressing   Problem: Pain Managment: Goal: General experience of comfort will improve Outcome: Progressing   Problem: Safety: Goal: Ability to remain free from injury will improve Outcome: Progressing   Problem: Skin Integrity: Goal: Risk for impaired skin integrity will decrease Outcome: Progressing   Problem: Education: Goal: Knowledge of disease or condition will improve Outcome: Progressing Goal: Knowledge of secondary prevention will improve (SELECT ALL) Outcome: Progressing Goal: Knowledge of patient specific risk factors will improve (INDIVIDUALIZE FOR PATIENT) Outcome: Progressing

## 2021-02-27 NOTE — Progress Notes (Signed)
Physical Therapy Treatment Patient Details Name: Brent Young MRN: [id number] DOB: 1960/11/16 Today's Date: 02/27/2021   History of Present Illness Pt is a 60 y/o male presenting w/ LUE and LLE weakness. 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 PMH significant for lacunar CVA's, DM II, HTN.    PT Comments    Pt received in supine, agreeable to therapy session and with good participation and tolerance for bed mobility and seated balance training.  He needs up to maxA for elevated HOB supine position to long sitting with RUE pulling on bed rail and trunk support on L side. Emphasis on supine/seated BLE A/PROM therapeutic exercises, self-assist for repositioning for pressure relief and pulling to long sit in bed for core/RUE strengthening and benefits of frequent repositioning. Pt continues to benefit from PT services to progress toward functional mobility goals. Continue to recommend AIR, pt reports 2/10 modified RPE and very motivated to progress functional independence.  Recommendations for follow up therapy are one component of a multi-disciplinary discharge planning process, led by the attending physician.  Recommendations may be updated based on patient status, additional functional criteria and insurance authorization.  Follow Up Recommendations  Acute inpatient rehab (3hours/day)     Assistance Recommended at Discharge Frequent or constant Supervision/Assistance  Equipment Recommendations  Wheelchair (measurements PT);Wheelchair cushion (measurements PT)    Recommendations for Other Services Rehab consult     Precautions / Restrictions Precautions Precautions: Fall Precaution Comments: BP goal 130-160 per neuro MD; Dense L side deficits Required Braces or Orthoses: Other Brace Other Brace: AFO for LLE Restrictions Weight Bearing Restrictions: No     Mobility  Bed Mobility Overal bed mobility: Needs Assistance Bed Mobility:  Rolling;Sidelying to Sit;Sit to Sidelying Rolling: Max assist         General bed mobility comments: Pt modA to roll with improved initiation today; pt able to pull himself into long sit from ~50 degrees elevated bed posture with mod/maxA x5 reps and able to maintain long sit in bed ~2 minutes with variable min to maxA.    Transfers                   General transfer comment: defer OOB transfers this morning due to plan to move pt to CIR and lack of +2 assist at time of session    Ambulation/Gait                   Stairs             Wheelchair Mobility    Modified Rankin (Stroke Patients Only) Modified Rankin (Stroke Patients Only) Pre-Morbid Rankin Score: No significant disability Modified Rankin: Severe disability     Balance Overall balance assessment: Needs assistance Sitting-balance support: Feet supported;Single extremity supported Sitting balance-Leahy Scale: Poor Sitting balance - Comments: Heavy lean to L that can be corrected short term with verbal cues, needs min to maxA for long sit in bed with R rail support                                    Cognition Arousal/Alertness: Awake/alert Behavior During Therapy: WFL for tasks assessed/performed Overall Cognitive Status: Within Functional Limits for tasks assessed                                 General  Comments: Very motivated for therapy, drowsy initially but easily awoken; decreased insight into deficits/likelihood of regaining full independence/strength.        Exercises General Exercises - Lower Extremity Ankle Circles/Pumps: AROM;PROM;Both;10 reps;Supine (PROM on L for this and all items below) Gluteal Sets: AROM;Strengthening;10 reps;Supine Long Arc Quad: AROM;PROM;10 reps;Other (comment) (bed in chair position) Heel Slides: AROM;PROM;10 reps;Supine Hip ABduction/ADduction: AROM;PROM;10 reps;Supine Straight Leg Raises: AROM;Right;5 reps;Supine Hip  Flexion/Marching: AROM;PROM;10 reps (bed in chair position)    General Comments General comments (skin integrity, edema, etc.): VSS per monitor in room and no dizziness reported with long sitting      Pertinent Vitals/Pain Pain Assessment: No/denies pain Pain Intervention(s): Monitored during session;Repositioned     PT Goals (current goals can now be found in the care plan section) Acute Rehab PT Goals Patient Stated Goal: To get stronger to play golf again and be able to go to church PT Goal Formulation: With patient Time For Goal Achievement: 03/04/21 Progress towards PT goals: Progressing toward goals    Frequency    Min 4X/week      PT Plan Current plan remains appropriate       AM-PAC PT "6 Clicks" Mobility   Outcome Measure  Help needed turning from your back to your side while in a flat bed without using bedrails?: Total Help needed moving from lying on your back to sitting on the side of a flat bed without using bedrails?: Total Help needed moving to and from a bed to a chair (including a wheelchair)?: Total Help needed standing up from a chair using your arms (e.g., wheelchair or bedside chair)?: Total Help needed to walk in hospital room?: Total Help needed climbing 3-5 steps with a railing? : Total 6 Click Score: 6    End of Session Equipment Utilized During Treatment: Gait belt Activity Tolerance: Patient tolerated treatment well Patient left: with call bell/phone within reach;in bed;with bed alarm set;Other (comment) (bed in full upright chair position, pillows under LUE for support and to prevent L lean) Nurse Communication: Mobility status;Need for lift equipment PT Visit Diagnosis: Unsteadiness on feet (R26.81);Hemiplegia and hemiparesis Hemiplegia - Right/Left: Left Hemiplegia - dominant/non-dominant: Non-dominant Hemiplegia - caused by: Cerebral infarction     Time: 1209-1237 PT Time Calculation (min) (ACUTE ONLY): 28 min  Charges:   $Therapeutic Exercise: 8-22 mins $Therapeutic Activity: 8-22 mins                     Oleta Gunnoe P., PTA Acute Rehabilitation Services Pager: [phone] Office: [phone]    Angus Palms 02/27/2021, 3:26 PM

## 2021-02-27 NOTE — Plan of Care (Signed)
  Problem: Acute Rehab OT Goals (only OT should resolve) Goal: Pt. Will Perform Grooming Outcome: Adequate for Discharge Goal: Pt. Will Perform Lower Body Dressing Outcome: Adequate for Discharge Goal: Pt. Will Transfer To Toilet Outcome: Adequate for Discharge Goal: Pt/Caregiver Will Perform Home Exercise Program Outcome: Adequate for Discharge   Problem: Acute Rehab OT Goals (only OT should resolve) Goal: Pt. Will Perform Upper Body Dressing Outcome: Adequate for Discharge   Problem: Acute Rehab PT Goals(only PT should resolve) Goal: Pt Will Go Supine/Side To Sit Outcome: Adequate for Discharge Goal: Patient Will Transfer Sit To/From Stand Outcome: Adequate for Discharge Goal: Pt Will Transfer Bed To Chair/Chair To Bed Outcome: Adequate for Discharge Goal: Pt Will Perform Standing Balance Or Pre-Gait Outcome: Adequate for Discharge   Problem: Education: Goal: Knowledge of General Education information will improve Description: Including pain rating scale, medication(s)/side effects and non-pharmacologic comfort measures Outcome: Adequate for Discharge   Problem: Health Behavior/Discharge Planning: Goal: Ability to manage health-related needs will improve Outcome: Adequate for Discharge   Problem: Clinical Measurements: Goal: Will remain free from infection Outcome: Adequate for Discharge Goal: Diagnostic test results will improve Outcome: Adequate for Discharge Goal: Respiratory complications will improve Outcome: Adequate for Discharge Goal: Cardiovascular complication will be avoided Outcome: Adequate for Discharge   Problem: Activity: Goal: Risk for activity intolerance will decrease Outcome: Adequate for Discharge   Problem: Nutrition: Goal: Adequate nutrition will be maintained Outcome: Adequate for Discharge   Problem: Coping: Goal: Level of anxiety will decrease Outcome: Adequate for Discharge   Problem: Elimination: Goal: Will not experience  complications related to bowel motility Outcome: Adequate for Discharge Goal: Will not experience complications related to urinary retention Outcome: Adequate for Discharge   Problem: Pain Managment: Goal: General experience of comfort will improve Outcome: Adequate for Discharge   Problem: Safety: Goal: Ability to remain free from injury will improve Outcome: Adequate for Discharge   Problem: Skin Integrity: Goal: Risk for impaired skin integrity will decrease Outcome: Adequate for Discharge   Problem: Education: Goal: Knowledge of disease or condition will improve Outcome: Adequate for Discharge Goal: Knowledge of secondary prevention will improve (SELECT ALL) Outcome: Adequate for Discharge Goal: Knowledge of patient specific risk factors will improve (INDIVIDUALIZE FOR PATIENT) Outcome: Adequate for Discharge   Problem: SLP Dysphagia Goals Goal: Patient will utilize recommended strategies Description: Patient will utilize recommended strategies during swallow to increase swallowing safety with Outcome: Adequate for Discharge

## 2021-02-27 NOTE — Progress Notes (Signed)
Speech Language Pathology Treatment: Dysphagia  Patient Details Name: Brent Young MRN: [id number] DOB: [birth year] Today's Date: 02/27/2021 Time: 0012-3935 SLP Time Calculation (min) (ACUTE ONLY): 10 min  Assessment / Plan / Recommendation Clinical Impression  Pt seen for follow up regarding recent changes in PO diet. Pt currently receiving Dys2 diet and thin liquids, and reports tolerating this consistency well. No difficulty with mastication or overt s/s aspiration observed or reported. Will continue dys 2 diet and thin liquids at this time. Pt transferring to CIR this afternoon for continued rehab. Please reconsult speech if needs arise.   HPI HPI: Pt is a 60 year old male admitted with Recurrent acute CVA, right vertebral artery stenosis on background extensive small vessel ischemic cerebrovascular disease, history of L basal ganglia and hippocampal ICH: MRI shows Right basal ganglia, corona radiata, with left hemiparesis, dysphagia. Pt had modified barium swallow (MBS) at [facility] outpatient services on 03-12-20 showing mild pharyngeal dysphagia with Delayed initiation of the swallow with delayed epiglottic inversion. Reduced vestibular closure. Intermittent backflow of barium into the pyriform sinuses contributing up to min residue after the swallow. Frequent penetration occured, no history of pulmonary problems.      SLP Plan  Discharge SLP treatment due to goals met      Recommendations for follow up therapy are one component of a multi-disciplinary discharge planning process, led by the attending physician.  Recommendations may be updated based on patient status, additional functional criteria and insurance authorization.    Recommendations  Diet recommendations: Dysphagia 2 (fine chop);Thin liquid Liquids provided via: Cup;Straw Medication Administration: Whole meds with liquid Supervision: Patient able to self feed Compensations: Slow rate;Small sips/bites;Minimize  environmental distractions Postural Changes and/or Swallow Maneuvers: Seated upright 90 degrees;Upright 30-60 min after meal                Oral Care Recommendations: Oral care BID Follow Up Recommendations: No SLP follow up SLP Visit Diagnosis: Dysphagia, unspecified (R13.10) Plan: Discharge SLP treatment due to (comment)       GO               Dessirae Scarola B. Quentin Ore, [facility], [place] Speech Language Pathologist Office: [phone]  [name]  02/27/2021, 12:10 PM

## 2021-02-27 NOTE — Progress Notes (Signed)
PROGRESS NOTE  Brent Young NFA:[id number] DOB: 10/14/1960 DOA: 02/16/2021 PCP: Kerin Perna, NP   LOS: 10 days   Brief Narrative / Interim history: 60 year old male with prior history of lacunar CVAs, arthritis, DM2, HTN, obesity class II, who comes to the hospital on 11/27 as a code stroke with left arm and left leg weakness.  This happened upon waking up, last known well was at 10 PM the night prior.  Neurology evaluated in the ED, an MRI demonstrated acute-subacute infarcts in the right basal ganglia and corona radiata on background of extensive chronic small vessel ischemia.  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  Neuro interventional IR was also consulted and underwent cerebral angiogram  Subjective No complaints this morning.  Continues to have left-sided weakness.  Tolerating his diet.  Occasional shortness of breath though he denies any cough.   Assessment & Plan:   Recurrent Acute to subacute perforator infarcts at the right basal ganglia and corona radiata due to right ICA siphon severe stenosis  Patient seen by neurology.   CT angiogram showed right V4 likely occluded proximal to the takeoff of the right PICA with no definitive flow seen in the right PICA which is new from prior exam.  He also has significant extensive intracranial atherosclerosis.   He underwent a 2D echocardiogram showed an EF of 60-65%.   LDL was 98 and he is on atorvastatin, dose increased from 10 to 40 mg daily.   A1c was 9.9.  Urine drug screen was positive for THC.   Interventional radiology consulted by neurology for cerebral angiogram.  Cerebral angiogram showed left ICA 65-70% stenosis, bilateral ICAs-mild to moderate stenosis, right VA severe stenosis.   Neurology recommends aspirin 325 and Plavix 75 DAPT for 3 months  then aspirin alone.   On dysphagia 2 diet. Continues to have left-sided weakness but stable.  Waiting on inpatient rehabilitation..  Essential hypertension BP goal for him is 130-160 given severe intravascular stenosis.   Continue amlodipine, irbesartan, metoprolol.   Blood pressure is stable.  Pulmonary edema Noted to be dyspneic on 12/2.  Faint crackles bibasilarly, chest x-ray showed mild fluid overload.  Received Lasix x1, responded well.   Continues to remain on 2 L of oxygen by nasal cannula.  Echocardiogram showed normal systolic function.  Respiratory status has been stable. Will repeat chest x-ray today.  Diabetes mellitus type 2, uncontrolled with hyperglycemia HbA1c 9.9.  Patient is on linagliptin as well as metformin.  Also on SSI.  CBGs seem to be better.  Was also on glipizide at home which can also be resumed eventually if CBG's remain poorly controlled.  Right shoulder pain Consistent with mild AC joint arthritis possibly from falling and overuse. XR without fracture or dislocation.  Supportive management  Tobacco use  Cessation counseling provided   Demand myocardial ischemia High-sensitivity troponin flat, not in a pattern consistent with ACS.  No chest pain   Hypokalemia  Was repleted.  Check periodically.  Supplement today.  Hyperlipidemia LDL was 98. Continue atorvastatin 40 mg  Leukocytosis   Likely reactive.  Resolved.      Obesity, class II Estimated body mass index is 36.28 kg/m as calculated from the following:   Height as of this encounter: 6' 1"  (1.854 m).   Weight as of this encounter: 124.7 kg.  DVT prophylaxis: Lovenox CODE STATUS: Full code Family communication: Discussed with patient Disposition: Waiting on inpatient rehabilitation  Status is: Inpatient  Remains inpatient appropriate because: Acute stroke        Scheduled Meds:   stroke: mapping our early stages of recovery book   Does not apply Once   acetaminophen  1,000  mg Oral Once   amLODipine  10 mg Oral Daily   aspirin EC  325 mg Oral Daily   atorvastatin  40 mg Oral Daily   clopidogrel  75 mg Oral Daily   enoxaparin (LOVENOX) injection  40 mg Subcutaneous Q24H   gabapentin  300 mg Oral TID   insulin aspart  0-9 Units Subcutaneous TID WC   insulin starter kit- pen needles  1 kit Other Once   irbesartan  75 mg Oral Daily   linagliptin  5 mg Oral Daily   living well with diabetes book   Does not apply Once   mouth rinse  15 mL Mouth Rinse BID   metFORMIN  1,000 mg Oral BID WC   metoprolol succinate  25 mg Oral Daily   Oxcarbazepine  300 mg Oral TID   Continuous Infusions: PRN Meds:.acetaminophen **OR** acetaminophen (TYLENOL) oral liquid 160 mg/5 mL **OR** acetaminophen, hydrALAZINE, oxyCODONE  Diet Orders (From admission, onward)     Start     Ordered   02/26/21 0844  DIET DYS 2 Room service appropriate? Yes; Fluid consistency: Thin  Diet effective now       Question Answer Comment  Room service appropriate? Yes   Fluid consistency: Thin      02/26/21 0843            Consultants:  Neurology, neuroIR  Procedures:  2D echo  Microbiology  none  Antimicrobials: none    Objective: Vitals:   02/26/21 2331 02/27/21 0323 02/27/21 0718 02/27/21 0752  BP: (!) 144/76 (!) 148/88 (!) 154/82 (!) 160/80  Pulse: 91 85 89 90  Resp: 20 17 16 20   Temp: 98 F (36.7 C) 97.6 F (36.4 C)  98 F (36.7 C)  TempSrc: Axillary Axillary  Oral  SpO2: 93% 95% 96% 98%  Weight:      Height:        Intake/Output Summary (Last 24 hours) at 02/27/2021 1045 Last data filed at 02/27/2021 0745 Gross per 24 hour  Intake 60 ml  Output 125 ml  Net -65 ml    Filed Weights   02/16/21 2106  Weight: 124.7 kg    Examination:  General appearance: Awake alert.  In no distress Resp: Clear to auscultation bilaterally.  Normal effort Cardio: S1-S2 is normal regular.  No S3-S4.  No rubs murmurs or bruit GI: Abdomen is soft.  Nontender nondistended.   Bowel sounds are present normal.  No masses organomegaly Extremities: No edema.   Neurologic: Alert and oriented x3.  Left-sided hemiparesis    Data Reviewed: I have independently reviewed following labs and imaging studies   CBC: Recent Labs  Lab 02/21/21 0354 02/27/21 0346  WBC 9.0 8.1  HGB 13.7 12.8*  HCT 41.9 39.9  MCV 93.9 94.3  PLT 254 155    Basic Metabolic Panel: Recent Labs  Lab 02/21/21 0354 02/22/21 0842 02/27/21 0346  NA 136 137 137  K 3.8 3.9 3.6  CL 100 96* 97*  CO2 30 31 33*  GLUCOSE 213* 178* 101*  BUN 15 11 15   CREATININE 0.81 0.63 0.68  CALCIUM 8.9 9.3 9.1     CBG:  Recent Labs  Lab 02/26/21 0838 02/26/21 1226 02/26/21 1654 02/26/21 2106 02/27/21 0617  GLUCAP 157* 163* 106* 107* 106*     No results found for this or any previous visit (from the past 240 hour(s)).     Radiology Studies: No results found.  Bonnielee Haff 02/27/2021

## 2021-02-27 NOTE — Discharge Summary (Signed)
[facility]  Physician Discharge Summary   Patient ID: Brent Young MRN: [id number] DOB/AGE: [birth year] 60 y.o.  Admit date: 02/16/2021 Discharge date:   02/27/2021   PCP: Kerin Perna, NP  DISCHARGE DIAGNOSES:  Recurrent acute stroke Essential hypertension Pulmonary edema Diabetes mellitus type 2, uncontrolled with hyperglycemia   PATIENT BEING DISCHARGED TO [facility]   RECOMMENDATIONS FOR OUTPATIENT FOLLOW UP: Please check basic metabolic panel in 3 days    Home Health: [facility] Equipment/Devices: None  CODE STATUS: Full code  DISCHARGE CONDITION: fair  Diet recommendation: Modified carbohydrate  INITIAL HISTORY: 60 year old male with prior history of lacunar CVAs, arthritis, DM2, HTN, obesity class II, who comes to the hospital on 11/27 as a code stroke with left arm and left leg weakness.  This happened upon waking up, last known well was at 10 PM the night prior.  Neurology evaluated in the ED, an MRI demonstrated acute-subacute infarcts in the right basal ganglia and corona radiata on background of extensive chronic small vessel ischemia.  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  Neuro interventional IR was also consulted and underwent cerebral angiogram.  Consultations: Neurology Interventional radiology  Procedures: Transthoracic echocardiogram    HOSPITAL COURSE:   Recurrent Acute to subacute perforator infarcts at the right basal ganglia and corona radiata due to right ICA siphon severe stenosis  Patient seen by neurology.  CT angiogram showed right V4 likely occluded proximal to the takeoff of the right PICA with no definitive flow seen in the right PICA which is new from prior exam.  He also has significant extensive intracranial  atherosclerosis.   He underwent a 2D echocardiogram showed an EF of 60-65%.   LDL was 98 and he is on atorvastatin, dose increased from 10 to 40 mg daily.   A1c was 9.9.  Urine drug screen was positive for THC.   Interventional radiology consulted by neurology for cerebral angiogram.  Cerebral angiogram showed left ICA 65-70% stenosis, bilateral ICAs-mild to moderate stenosis, right VA severe stenosis.   Neurology recommends aspirin 325 and Plavix 75 DAPT for 3 months then aspirin alone.   On dysphagia 2 diet.   Essential hypertension BP goal for him is 130-160 given severe intravascular stenosis.   Continue amlodipine, irbesartan, metoprolol.     Pulmonary edema Noted to be dyspneic on 12/2.  Faint crackles bibasilarly, chest x-ray showed mild fluid overload.  Received Lasix x1, responded well.   Continues to remain on 2 L of oxygen by nasal cannula.  Echocardiogram showed normal systolic function.  Respiratory status has been stable but he remains on 2 L of oxygen by nasal cannula.  X-ray was repeated today and shows mild pulmonary edema.  Does not have increased respiratory effort.  We will give him a dose of IV Lasix today and initiate oral Lasix from tomorrow.  Potassium to be supplemented as well.   Diabetes mellitus type 2, uncontrolled with hyperglycemia HbA1c 9.9.  Patient is on linagliptin as well as metformin.  Also on SSI.  CBGs seem to be better.  Was also on glipizide at home which can also be resumed eventually if CBG's remain poorly controlled.   Right shoulder pain Consistent with mild AC joint arthritis possibly from falling and overuse. XR without fracture or dislocation.  Supportive management   Tobacco  use  Cessation counseling provided   Demand myocardial ischemia High-sensitivity troponin flat, not in a pattern consistent with ACS.  No chest pain    Hypokalemia  Was repleted.  Check periodically.  Supplement today.   Hyperlipidemia LDL was 98. Continue  atorvastatin 40 mg   Leukocytosis   Likely reactive.  Resolved.      Obesity, class II Estimated body mass index is 36.28 kg/m as calculated from the following:   Height as of this encounter: 6' 1"  (1.854 m).   Weight as of this encounter: 124.7 kg.     Patient is stable.  Okay for discharge to [facility].   PERTINENT LABS:  The results of significant diagnostics from this hospitalization (including imaging, microbiology, ancillary and laboratory) are listed below for reference.     Labs:  COVID-19 Labs   Lab Results  Component Value Date   [place] NEGATIVE 02/16/2021      Basic Metabolic Panel: Recent Labs  Lab 02/21/21 0354 02/22/21 0842 02/27/21 0346  NA 136 137 137  K 3.8 3.9 3.6  CL 100 96* 97*  CO2 30 31 33*  GLUCOSE 213* 178* 101*  BUN 15 11 15   CREATININE 0.81 0.63 0.68  CALCIUM 8.9 9.3 9.1    CBC: Recent Labs  Lab 02/21/21 0354 02/27/21 0346  WBC 9.0 8.1  HGB 13.7 12.8*  HCT 41.9 39.9  MCV 93.9 94.3  PLT 254 262     CBG: Recent Labs  Lab 02/26/21 1226 02/26/21 1654 02/26/21 2106 02/27/21 0617 02/27/21 1128  GLUCAP 163* 106* 107* 106* 128*     IMAGING STUDIES CT ANGIO HEAD W OR WO CONTRAST  Result Date: 02/16/2021 CLINICAL DATA:  Left-sided weakness EXAM: CT ANGIOGRAPHY HEAD AND NECK TECHNIQUE: Multidetector CT imaging of the head and neck was performed using the standard protocol during bolus administration of intravenous contrast. Multiplanar CT image reconstructions and MIPs were obtained to evaluate the vascular anatomy. Carotid stenosis measurements (when applicable) are obtained utilizing NASCET criteria, using the distal internal carotid diameter as the denominator. CONTRAST:  68m OMNIPAQUE IOHEXOL 350 MG/ML SOLN COMPARISON:  02/28/2020, correlation is made with same day CT head FINDINGS: CT HEAD FINDINGS For noncontrast findings, please see same day CT head. CTA NECK FINDINGS Aortic arch: Two-vessel arch with a common origin  of the brachiocephalic and left common carotid arteries. Imaged portion shows no evidence of aneurysm or dissection. No significant stenosis of the major arch vessel origins. Moderate calcified and noncalcified plaque. Right carotid system: No evidence of dissection, stenosis (50% or greater) or occlusion. Mild narrowing at the bifurcation, secondary to calcified and noncalcified plaque, which is not hemodynamically significant. Left carotid system: 65-70% stenosis of the proximal left ICA, secondary to calcified and noncalcified plaque. No evidence of dissection or occlusion. Vertebral arteries: Severe stenosis at the origin of the right vertebral artery (series 8, images 295-300), which appears worse than on the prior exam. Minimal flow in the right V2 and V3 segments. The extracranial left vertebral artery is without significant stenosis. Skeleton: No acute osseous abnormality. Degenerative changes in the cervical spine. Other neck: Negative. Upper chest: Mild ground-glass opacities, which are nonspecific may represent pulmonary edema. No pleural effusion. Unchanged prominent mediastinal lymph nodes. Review of the MIP images confirms the above findings CTA HEAD FINDINGS Anterior circulation: Redemonstrated moderate to severe calcified plaque in the carotid siphons, right greater than left, overall unchanged. A1 segments patent. Normal anterior communicating artery. Anterior cerebral arteries are patent to their distal aspects.  No M1 stenosis or occlusion. Normal MCA bifurcations. Distal MCA branches perfused and symmetric. Posterior circulation: Focal plaque in the left V4, which is otherwise patent. The diminutive right V4 is poorly opacified intracranially, with near complete occlusion just proximal to the take-off of the right PICA and only intermittent faint flow to the vertebrobasilar junction, some of which may be retrograde. The right PICA is not opacified. The left PICA is patent. Basilar patent to its  distal aspect. Superior cerebellar arteries patent bilaterally. PCAs perfused to their distal aspects without stenosis. The bilateral posterior communicating arteries are not visualized Venous sinuses: As permitted by contrast timing, patent. Anatomic variants: None significant Review of the MIP images confirms the above findings IMPRESSION: 1. Poor opacification of the right vertebral artery throughout its course, with increased focal stenosis just distal to its take-off. The right V4 is likely occluded proximal to the takeoff of the right PICA, with no definitive flow seen in the right PICA, which is new from prior exam. 2. Redemonstrated moderate to severe calcified plaque in the carotid siphons, right greater than left, overall unchanged. 3. 65-70% stenosis of the proximal left ICA, secondary to calcified and noncalcified plaque. Electronically Signed   By: Merilyn Baba M.D.   On: 02/16/2021 21:08   DG Chest 1 View  Result Date: 02/16/2021 CLINICAL DATA:  Status post fall with left-sided weakness. EXAM: CHEST  1 VIEW COMPARISON:  December 26, 2020 FINDINGS: Mild to moderate severity, predominant stable areas of scarring and/or atelectasis are noted within the bilateral lung bases there is no evidence of a pleural effusion or pneumothorax. The cardiac silhouette is mildly enlarged and unchanged in size. The visualized skeletal structures are unremarkable. IMPRESSION: Stable cardiomegaly with mild to moderate severity, predominant stable areas of bibasilar scarring and/or atelectasis. Electronically Signed   By: Virgina Norfolk M.D.   On: 02/16/2021 23:51   DG Shoulder Right  Result Date: 02/16/2021 CLINICAL DATA:  Fall. EXAM: RIGHT SHOULDER - 2+ VIEW COMPARISON:  Right shoulder x-ray 05/02/2020 FINDINGS: Healed greater tuberosity fracture is present. No definite acute fracture or dislocation. There are stable mild degenerative changes of the acromioclavicular joint. There are vascular calcifications in  the soft tissues. IMPRESSION: 1. No acute bony abnormality. Electronically Signed   By: Ronney Asters M.D.   On: 02/16/2021 23:38   CT ANGIO NECK W OR WO CONTRAST  Result Date: 02/16/2021 CLINICAL DATA:  Left-sided weakness EXAM: CT ANGIOGRAPHY HEAD AND NECK TECHNIQUE: Multidetector CT imaging of the head and neck was performed using the standard protocol during bolus administration of intravenous contrast. Multiplanar CT image reconstructions and MIPs were obtained to evaluate the vascular anatomy. Carotid stenosis measurements (when applicable) are obtained utilizing NASCET criteria, using the distal internal carotid diameter as the denominator. CONTRAST:  63m OMNIPAQUE IOHEXOL 350 MG/ML SOLN COMPARISON:  02/28/2020, correlation is made with same day CT head FINDINGS: CT HEAD FINDINGS For noncontrast findings, please see same day CT head. CTA NECK FINDINGS Aortic arch: Two-vessel arch with a common origin of the brachiocephalic and left common carotid arteries. Imaged portion shows no evidence of aneurysm or dissection. No significant stenosis of the major arch vessel origins. Moderate calcified and noncalcified plaque. Right carotid system: No evidence of dissection, stenosis (50% or greater) or occlusion. Mild narrowing at the bifurcation, secondary to calcified and noncalcified plaque, which is not hemodynamically significant. Left carotid system: 65-70% stenosis of the proximal left ICA, secondary to calcified and noncalcified plaque. No evidence of dissection or occlusion.  Vertebral arteries: Severe stenosis at the origin of the right vertebral artery (series 8, images 295-300), which appears worse than on the prior exam. Minimal flow in the right V2 and V3 segments. The extracranial left vertebral artery is without significant stenosis. Skeleton: No acute osseous abnormality. Degenerative changes in the cervical spine. Other neck: Negative. Upper chest: Mild ground-glass opacities, which are  nonspecific may represent pulmonary edema. No pleural effusion. Unchanged prominent mediastinal lymph nodes. Review of the MIP images confirms the above findings CTA HEAD FINDINGS Anterior circulation: Redemonstrated moderate to severe calcified plaque in the carotid siphons, right greater than left, overall unchanged. A1 segments patent. Normal anterior communicating artery. Anterior cerebral arteries are patent to their distal aspects. No M1 stenosis or occlusion. Normal MCA bifurcations. Distal MCA branches perfused and symmetric. Posterior circulation: Focal plaque in the left V4, which is otherwise patent. The diminutive right V4 is poorly opacified intracranially, with near complete occlusion just proximal to the take-off of the right PICA and only intermittent faint flow to the vertebrobasilar junction, some of which may be retrograde. The right PICA is not opacified. The left PICA is patent. Basilar patent to its distal aspect. Superior cerebellar arteries patent bilaterally. PCAs perfused to their distal aspects without stenosis. The bilateral posterior communicating arteries are not visualized Venous sinuses: As permitted by contrast timing, patent. Anatomic variants: None significant Review of the MIP images confirms the above findings IMPRESSION: 1. Poor opacification of the right vertebral artery throughout its course, with increased focal stenosis just distal to its take-off. The right V4 is likely occluded proximal to the takeoff of the right PICA, with no definitive flow seen in the right PICA, which is new from prior exam. 2. Redemonstrated moderate to severe calcified plaque in the carotid siphons, right greater than left, overall unchanged. 3. 65-70% stenosis of the proximal left ICA, secondary to calcified and noncalcified plaque. Electronically Signed   By: Merilyn Baba M.D.   On: 02/16/2021 21:08   MR BRAIN WO CONTRAST  Result Date: 02/17/2021 CLINICAL DATA:  Stroke follow-up EXAM: MRI  HEAD WITHOUT CONTRAST TECHNIQUE: Multiplanar, multiecho pulse sequences of the brain and surrounding structures were obtained without intravenous contrast. COMPARISON:  CT and CTA from yesterday FINDINGS: Brain: Wedges of restricted diffusion spanning the right corona radiata anteriorly and extending from the posterior putamen to the caudate nucleus posteriorly. Lacune with facilitated peripheral diffusion in the periventricular white matter on the right. Extensive chronic small vessel ischemia in the hemispheric white matter with confluent periventricular ischemic gliosis and multiple chronic lacunar infarcts. Small remote right cerebellar infarction. Vascular: Absent right vertebral flow void correlating with prior CTA. Skull and upper cervical spine: Normal marrow signal. Sinuses/Orbits: Negative. IMPRESSION: 1. Acute to subacute perforator infarcts at the right basal ganglia and corona radiata. 2. Extensive chronic small vessel ischemia. Electronically Signed   By: Jorje Guild M.D.   On: 02/17/2021 06:38   IR US Guide Vasc Access Right  Result Date: 02/20/2021 INDICATION: Brent Young is a 60 y.o. male with a past medical history significant for arthritis, DM, HTN, previous lacunar strokes who presented to [facility] ED on 02/16/21 with complaints of left arm and leg weakness. CT angiogram of the head and neck showed severe intracranial atherosclerotic disease with questionable degree of narrowing of the bilateral carotid siphons. He comes to our service today for a diagnostic cerebral angiogram to better evaluate CT angiography findings. EXAM: ULTRASOUND-GUIDED VASCULAR ACCESS DIAGNOSTIC CEREBRAL ANGIOGRAM COMPARISON:  CT angiogram February 16, 2021. MEDICATIONS: No antibiotics utilized. ANESTHESIA/SEDATION: Moderate (conscious) sedation was employed during this procedure. A total of 1 mg of versed and 50 mcg of fentanyl or administered intravenously by the radiology nurse. Total intra-service moderate  Sedation Time: 50 minutes. The patient's level of consciousness and vital signs were monitored continuously by radiology nursing throughout the procedure under my direct supervision. CONTRAST:  96 mL of Omnipaque 300 milligram/mL FLUOROSCOPY TIME:  Fluoroscopy Time: 8 minutes 36 seconds (138 mGy). COMPLICATIONS: None immediate. TECHNIQUE: Informed written consent was obtained from the patient after a thorough discussion of the procedural risks, benefits and alternatives. All questions were addressed. Maximal Sterile Barrier Technique was utilized including caps, mask, sterile gowns, sterile gloves, sterile drape, hand hygiene and skin antiseptic. A timeout was performed prior to the initiation of the procedure. The right groin was prepped and draped in the usual sterile fashion. Using a micropuncture kit and the modified Seldinger technique, access was gained to the right common femoral artery and an 8 French sheath was placed. Real-time ultrasound guidance was utilized for vascular access including the acquisition of a permanent ultrasound image documenting patency of the accessed vessel. Under fluoroscopy, a 5 [place] Berenstein 2 catheter was navigated over a 0.035" Terumo Glidewire into the aortic arch. The catheter was placed into the right common carotid artery. Frontal and lateral angiograms of the neck were obtained. Using biplane roadmap, the catheter was advanced into the right internal carotid artery. Frontal, lateral, magnified right anterior oblique and magnified lateral views of the head were obtained. The catheter was then retracted into the right subclavian artery. Frontal and lateral angiograms of the neck were obtained followed by a cranial oblique view of the neck. Next, the catheter was placed into the left common carotid artery. Angiograms with frontal, lateral and bilateral oblique views of the neck were obtained. Then, angiograms with frontal, lateral, magnified left anterior oblique, magnified  lateral and magnified waters views of the head were obtained. The catheter was then placed into the left subclavian artery. Frontal and lateral angiograms of the neck were obtained. Under roadmap guidance, the catheter was advanced into the left vertebral artery. Angiograms with frontal, lateral, water's and magnified oblique views of the head were obtained. The catheter was subsequently withdrawn. A right common femoral artery angiograms in right anterior oblique view was obtained via sheath side port contrast injection. The puncture is at the level of the common femoral artery which has adequate caliber for closure device. A 5 [place] ExoSeal was utilized for access closure. Adequate hemostasis was achieved. FINDINGS: Right CCA angiograms: Calcified atherosclerotic plaque in the right carotid bifurcation without hemodynamically significant stenosis. Right ICA angiograms: Luminal irregularity of the cavernous and supraclinoid segment of right ICA, consistent with intracranial atherosclerotic disease. There is approximately 40% stenosis at the communicating segment of the right ICA. There is brisk vascular contrast filling of the right ACA and MCA vascular trees. No aneurysms or abnormally high-flow, early draining veins are seen. No regions of abnormal hypervascularity are noted. The visualized dural sinuses are patent. Right subclavian angiograms: Luminal irregularity of the right subclavian artery without hemodynamically significant stenosis. There is severe stenosis at the origin of the right vertebral artery with delayed filling and delayed washout. Left CCA angiograms: Heavily calcified plaque in the left carotid bifurcation resulting in 65% stenosis. Left ICA angiograms: Luminal irregularity of the cavernous and supraclinoid left ICA consistent with intracranial atherosclerotic disease with approximately 40% stenosis at the paraclinoid segment. There is brisk vascular contrast filling  of the left ACA and MCA  vascular trees. No aneurysms or abnormally high-flow, early draining veins are seen. No regions of abnormal hypervascularity are noted. The visualized dural sinuses are patent. Left subclavian angiograms: The left subclavian artery, proximal left vertebral artery and visualized branches of the thyrocervical trunk are unremarkable. Left vertebral artery angiograms: The left vertebral artery, basilar artery, and bilateral posterior cerebral arteries are unremarkable. No contrast reflux seen into the right vertebral artery. Luminal caliber is smooth and tapering. No aneurysms or abnormally high-flow, early draining veins are seen. No regions of abnormal hypervascularity are noted. The visualized dural sinuses are patent. Right common femoral artery angiograms: The access is at the right common femoral artery. The femoral artery has normal caliber, adequate for closure device. PROCEDURE: No intervention performed. IMPRESSION: 1. Atherosclerotic changes of the left carotid bifurcation with calcified plaque resulting in approximately 65% stenosis. 2. Atherosclerotic changes of the right carotid bifurcation without hemodynamically significant stenosis. 3. Atherosclerotic changes of the bilateral intracranial carotid arteries with approximately 40% stenosis at the communicating segment of the right ICA and 40% stenosis at the paraclinoid segment of the left ICA. 4. 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 No opacification of intracranial segment, consistent with occlusion seen on prior CT angiogram. PLAN: Findings discussed with Dr. Lottie Rater. No intervention required at the moment. Electronically Signed   By: Pedro Earls M.D.   On: 02/20/2021 16:21   DG CHEST PORT 1 VIEW  Result Date: 02/27/2021 CLINICAL DATA:  Dyspnea, code stroke EXAM: PORTABLE CHEST 1 VIEW COMPARISON:  Chest radiograph 02/21/2021 FINDINGS: The heart is enlarged, unchanged. The  mediastinal contours are stable. There is vascular congestion with probable mild pulmonary interstitial edema. Linear opacities projecting over both mid lungs likely reflects scarring and/or atelectasis, not significantly changed. There is no significant pleural effusion. There is no pneumothorax. There is no acute osseous abnormality. IMPRESSION: 1. Unchanged cardiomegaly with mild pulmonary interstitial edema. 2. Unchanged linear atelectasis and/or scar projecting over both mid lungs. No new focal airspace disease. Electronically Signed   By: Valetta Mole M.D.   On: 02/27/2021 11:41   DG CHEST PORT 1 VIEW  Result Date: 02/21/2021 CLINICAL DATA:  Dyspnea with acute shortness of breath last night. EXAM: PORTABLE CHEST 1 VIEW COMPARISON:  Chest radiograph 02/16/2021 FINDINGS: Stable enlarged cardiac silhouette. Aortic calcifications. Diffuse hazy interstitial thickening and prominence of the central pulmonary vasculature. Stable thin linear scarring versus subsegmental atelectasis at the peripheral mid and lower lungs. Subsegmental left basilar atelectasis. Probable trace left pleural fluid. No pneumothorax. No acute osseous abnormality. IMPRESSION: Stable cardiomegaly with mild pulmonary edema. Probable trace left pleural fluid. Aortic Atherosclerosis (ICD10-I70.0). Electronically Signed   By: Ileana Roup M.D.   On: 02/21/2021 10:47   DG Knee Complete 4 Views Left  Result Date: 02/16/2021 CLINICAL DATA:  Status post fall. EXAM: LEFT KNEE - COMPLETE 4+ VIEW COMPARISON:  None. FINDINGS: No evidence of an acute fracture or dislocation. No evidence of arthropathy or other focal bone abnormality. Moderate to marked severity vascular calcification is seen. A trace joint effusion is seen. IMPRESSION: 1. No acute osseous abnormality. 2. Trace joint effusion. Electronically Signed   By: Virgina Norfolk M.D.   On: 02/16/2021 23:52   DG Knee Complete 4 Views Right  Result Date: 02/16/2021 CLINICAL DATA:   Status post fall. EXAM: RIGHT KNEE - COMPLETE 4+ VIEW COMPARISON:  None. FINDINGS: No evidence of an acute fracture or  dislocation. No evidence of arthropathy or other focal bone abnormality. Moderate to marked severity vascular calcification is seen. A very small joint effusion is seen. IMPRESSION: 1. No acute fracture or dislocation. 2. Very small joint effusion. Electronically Signed   By: Virgina Norfolk M.D.   On: 02/16/2021 23:47   ECHOCARDIOGRAM COMPLETE  Result Date: 02/17/2021    ECHOCARDIOGRAM REPORT   Patient Name:   Brent Young Date of Exam: 02/17/2021 Medical Rec #:  [id number]        Height:       73.0 in Accession #:    [id number]       Weight:       275.0 lb Date of Birth:  12-29-1960        BSA:          2.463 m Patient Age:    23 years         BP:           188/110 mmHg Patient Gender: M                HR:           97 bpm. Exam Location:  Inpatient Procedure: 2D Echo, Cardiac Doppler, Color Doppler and Intracardiac            Opacification Agent Indications:    Stroke  History:        Patient has no prior history of Echocardiogram examinations. 2nd                 stroke.  Sonographer:    Merrie Roof RDCS Referring Phys: [id number] Stewart  1. Left ventricular ejection fraction, by estimation, is 60 to 65%. The left ventricle has normal function. The left ventricle has no regional wall motion abnormalities. There is moderate left ventricular hypertrophy. Indeterminate diastolic filling due  to E-A fusion.  2. Right ventricular systolic function is normal. The right ventricular size is normal. There is normal pulmonary artery systolic pressure.  3. Mild mitral valve regurgitation.  4. AV is thickened, calcified. Peak and mean gradients through the valveare 24 and 13 mm Hg respectively AVA (VTI) is 1.87 cm2 Dimensionless index is 0.46 COnsistent with mild AS. Marland Kitchen The aortic valve is tricuspid. Aortic valve regurgitation is not visualized. Aortic valve  sclerosis/calcification is present, without any evidence of aortic stenosis. FINDINGS  Left Ventricle: Left ventricular ejection fraction, by estimation, is 60 to 65%. The left ventricle has normal function. The left ventricle has no regional wall motion abnormalities. The left ventricular internal cavity size was normal in size. There is  moderate left ventricular hypertrophy. Indeterminate diastolic filling due to E-A fusion. Right Ventricle: The right ventricular size is normal. Right vetricular wall thickness was not assessed. Right ventricular systolic function is normal. There is normal pulmonary artery systolic pressure. The tricuspid regurgitant velocity is 2.70 m/s, and with an assumed right atrial pressure of 3 mmHg, the estimated right ventricular systolic pressure is 89.2 mmHg. Left Atrium: Left atrial size was normal in size. Right Atrium: Right atrial size was normal in size. Pericardium: Trivial pericardial effusion is present. Mitral Valve: There is mild thickening of the mitral valve leaflet(s). Mild mitral annular calcification. Mild mitral valve regurgitation. Tricuspid Valve: The tricuspid valve is normal in structure. Tricuspid valve regurgitation is trivial. Aortic Valve: AV is thickened, calcified. Peak and mean gradients through the valveare 24 and 13 mm Hg respectively AVA (VTI) is 1.87 cm2 Dimensionless index is 0.46 COnsistent  with mild AS. The aortic valve is tricuspid. Aortic valve regurgitation is not visualized. Aortic valve sclerosis/calcification is present, without any evidence of aortic stenosis. Aortic valve mean gradient measures 12.5 mmHg. Aortic valve peak gradient measures 23.7 mmHg. Aortic valve area, by VTI measures 1.90 cm. Pulmonic Valve: The pulmonic valve was normal in structure. Pulmonic valve regurgitation is not visualized. Aorta: The aortic root and ascending aorta are structurally normal, with no evidence of dilitation. IAS/Shunts: No atrial level shunt detected by  color flow Doppler.  LEFT VENTRICLE PLAX 2D LVIDd:         4.90 cm LVIDs:         3.30 cm LV PW:         1.60 cm LV IVS:        1.60 cm LVOT diam:     2.30 cm LV SV:         86 LV SV Index:   35 LVOT Area:     4.15 cm  RIGHT VENTRICLE RV Basal diam:  4.70 cm RV Mid diam:    3.80 cm LEFT ATRIUM              Index        RIGHT ATRIUM           Index LA diam:        4.90 cm  1.99 cm/m   RA Area:     21.70 cm LA Vol (A2C):   124.0 ml 50.34 ml/m  RA Volume:   60.60 ml  24.60 ml/m LA Vol (A4C):   103.0 ml 41.81 ml/m LA Biplane Vol: 114.0 ml 46.28 ml/m  AORTIC VALVE AV Area (Vmax):    1.96 cm AV Area (Vmean):   1.94 cm AV Area (VTI):     1.90 cm AV Vmax:           243.50 cm/s AV Vmean:          164.500 cm/s AV VTI:            0.456 m AV Peak Grad:      23.7 mmHg AV Mean Grad:      12.5 mmHg LVOT Vmax:         115.00 cm/s LVOT Vmean:        76.700 cm/s LVOT VTI:          0.208 m LVOT/AV VTI ratio: 0.46  AORTA Ao Root diam: 3.40 cm Ao Asc diam:  3.30 cm TRICUSPID VALVE TR Peak grad:   29.2 mmHg TR Vmax:        270.00 cm/s  SHUNTS Systemic VTI:  0.21 m Systemic Diam: 2.30 cm Dorris Carnes MD Electronically signed by Dorris Carnes MD Signature Date/Time: 02/17/2021/5:10:24 PM    Final    CT HEAD CODE STROKE WO CONTRAST  Result Date: 02/16/2021 CLINICAL DATA:  Code stroke.  Left-sided weakness EXAM: CT HEAD WITHOUT CONTRAST TECHNIQUE: Contiguous axial images were obtained from the base of the skull through the vertex without intravenous contrast. COMPARISON:  12/26/2020 FINDINGS: Brain: No acute cortical infarction. Periventricular white matter changes, likely the sequela of chronic small vessel ischemic disease, with redemonstrated small vessel infarcts in the bilateral periventricular white matter, basal ganglia, left pons, and right cerebellar hemisphere. No acute hemorrhage, mass mass effect, or midline shift. No extra-axial collection or hydrocephalus. Vascular: No hyperdense vessel or unexpected calcification.  Skull: Normal. Negative for fracture or focal lesion. Sinuses/Orbits: No acute finding. Other: The mastoid air cells are well aerated.  ASPECTS [facility] Stroke Program Early CT Score) - Ganglionic level infarction (caudate, lentiform nuclei, internal capsule, insula, M1-M3 cortex): 7 - Supraganglionic infarction (M4-M6 cortex): 3 Total score (0-10 with 10 being normal): 10 IMPRESSION: 1. No acute intracranial process. 2. ASPECTS is 10 Code stroke imaging results were communicated on 02/16/2021 at 8:27 pm to provider Dr. Lorrin Goodell via secure text paging. Electronically Signed   By: Merilyn Baba M.D.   On: 02/16/2021 20:29   IR ANGIO INTRA EXTRACRAN SEL COM CAROTID INNOMINATE UNI L MOD SED  Result Date: 02/20/2021 INDICATION: Brent Young is a 60 y.o. male with a past medical history significant for arthritis, DM, HTN, previous lacunar strokes who presented to [facility] ED on 02/16/21 with complaints of left arm and leg weakness. CT angiogram of the head and neck showed severe intracranial atherosclerotic disease with questionable degree of narrowing of the bilateral carotid siphons. He comes to our service today for a diagnostic cerebral angiogram to better evaluate CT angiography findings. EXAM: ULTRASOUND-GUIDED VASCULAR ACCESS DIAGNOSTIC CEREBRAL ANGIOGRAM COMPARISON:  CT angiogram February 16, 2021. MEDICATIONS: No antibiotics utilized. ANESTHESIA/SEDATION: Moderate (conscious) sedation was employed during this procedure. A total of 1 mg of versed and 50 mcg of fentanyl or administered intravenously by the radiology nurse. Total intra-service moderate Sedation Time: 50 minutes. The patient's level of consciousness and vital signs were monitored continuously by radiology nursing throughout the procedure under my direct supervision. CONTRAST:  96 mL of Omnipaque 300 milligram/mL FLUOROSCOPY TIME:  Fluoroscopy Time: 8 minutes 36 seconds (138 mGy). COMPLICATIONS: None immediate. TECHNIQUE: Informed written consent  was obtained from the patient after a thorough discussion of the procedural risks, benefits and alternatives. All questions were addressed. Maximal Sterile Barrier Technique was utilized including caps, mask, sterile gowns, sterile gloves, sterile drape, hand hygiene and skin antiseptic. A timeout was performed prior to the initiation of the procedure. The right groin was prepped and draped in the usual sterile fashion. Using a micropuncture kit and the modified Seldinger technique, access was gained to the right common femoral artery and an 8 French sheath was placed. Real-time ultrasound guidance was utilized for vascular access including the acquisition of a permanent ultrasound image documenting patency of the accessed vessel. Under fluoroscopy, a 5 [place] Berenstein 2 catheter was navigated over a 0.035" Terumo Glidewire into the aortic arch. The catheter was placed into the right common carotid artery. Frontal and lateral angiograms of the neck were obtained. Using biplane roadmap, the catheter was advanced into the right internal carotid artery. Frontal, lateral, magnified right anterior oblique and magnified lateral views of the head were obtained. The catheter was then retracted into the right subclavian artery. Frontal and lateral angiograms of the neck were obtained followed by a cranial oblique view of the neck. Next, the catheter was placed into the left common carotid artery. Angiograms with frontal, lateral and bilateral oblique views of the neck were obtained. Then, angiograms with frontal, lateral, magnified left anterior oblique, magnified lateral and magnified waters views of the head were obtained. The catheter was then placed into the left subclavian artery. Frontal and lateral angiograms of the neck were obtained. Under roadmap guidance, the catheter was advanced into the left vertebral artery. Angiograms with frontal, lateral, water's and magnified oblique views of the head were obtained. The  catheter was subsequently withdrawn. A right common femoral artery angiograms in right anterior oblique view was obtained via sheath side port contrast injection. The puncture is at the level of the common femoral  artery which has adequate caliber for closure device. A 5 [place] ExoSeal was utilized for access closure. Adequate hemostasis was achieved. FINDINGS: Right CCA angiograms: Calcified atherosclerotic plaque in the right carotid bifurcation without hemodynamically significant stenosis. Right ICA angiograms: Luminal irregularity of the cavernous and supraclinoid segment of right ICA, consistent with intracranial atherosclerotic disease. There is approximately 40% stenosis at the communicating segment of the right ICA. There is brisk vascular contrast filling of the right ACA and MCA vascular trees. No aneurysms or abnormally high-flow, early draining veins are seen. No regions of abnormal hypervascularity are noted. The visualized dural sinuses are patent. Right subclavian angiograms: Luminal irregularity of the right subclavian artery without hemodynamically significant stenosis. There is severe stenosis at the origin of the right vertebral artery with delayed filling and delayed washout. Left CCA angiograms: Heavily calcified plaque in the left carotid bifurcation resulting in 65% stenosis. Left ICA angiograms: Luminal irregularity of the cavernous and supraclinoid left ICA consistent with intracranial atherosclerotic disease with approximately 40% stenosis at the paraclinoid segment. There is brisk vascular contrast filling of the left ACA and MCA vascular trees. No aneurysms or abnormally high-flow, early draining veins are seen. No regions of abnormal hypervascularity are noted. The visualized dural sinuses are patent. Left subclavian angiograms: The left subclavian artery, proximal left vertebral artery and visualized branches of the thyrocervical trunk are unremarkable. Left vertebral artery angiograms:  The left vertebral artery, basilar artery, and bilateral posterior cerebral arteries are unremarkable. No contrast reflux seen into the right vertebral artery. Luminal caliber is smooth and tapering. No aneurysms or abnormally high-flow, early draining veins are seen. No regions of abnormal hypervascularity are noted. The visualized dural sinuses are patent. Right common femoral artery angiograms: The access is at the right common femoral artery. The femoral artery has normal caliber, adequate for closure device. PROCEDURE: No intervention performed. IMPRESSION: 1. Atherosclerotic changes of the left carotid bifurcation with calcified plaque resulting in approximately 65% stenosis. 2. Atherosclerotic changes of the right carotid bifurcation without hemodynamically significant stenosis. 3. Atherosclerotic changes of the bilateral intracranial carotid arteries with approximately 40% stenosis at the communicating segment of the right ICA and 40% stenosis at the paraclinoid segment of the left ICA. 4. 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 No opacification of intracranial segment, consistent with occlusion seen on prior CT angiogram. PLAN: Findings discussed with Dr. Lottie Rater. No intervention required at the moment. Electronically Signed   By: Pedro Earls M.D.   On: 02/20/2021 16:21   IR ANGIO INTRA EXTRACRAN SEL INTERNAL CAROTID UNI R MOD SED  Result Date: 02/20/2021 INDICATION: Brent Young is a 60 y.o. male with a past medical history significant for arthritis, DM, HTN, previous lacunar strokes who presented to [facility] ED on 02/16/21 with complaints of left arm and leg weakness. CT angiogram of the head and neck showed severe intracranial atherosclerotic disease with questionable degree of narrowing of the bilateral carotid siphons. He comes to our service today for a diagnostic cerebral angiogram to better evaluate CT angiography findings.  EXAM: ULTRASOUND-GUIDED VASCULAR ACCESS DIAGNOSTIC CEREBRAL ANGIOGRAM COMPARISON:  CT angiogram February 16, 2021. MEDICATIONS: No antibiotics utilized. ANESTHESIA/SEDATION: Moderate (conscious) sedation was employed during this procedure. A total of 1 mg of versed and 50 mcg of fentanyl or administered intravenously by the radiology nurse. Total intra-service moderate Sedation Time: 50 minutes. The patient's level of consciousness and vital signs were monitored continuously by radiology nursing throughout the procedure  under my direct supervision. CONTRAST:  96 mL of Omnipaque 300 milligram/mL FLUOROSCOPY TIME:  Fluoroscopy Time: 8 minutes 36 seconds (138 mGy). COMPLICATIONS: None immediate. TECHNIQUE: Informed written consent was obtained from the patient after a thorough discussion of the procedural risks, benefits and alternatives. All questions were addressed. Maximal Sterile Barrier Technique was utilized including caps, mask, sterile gowns, sterile gloves, sterile drape, hand hygiene and skin antiseptic. A timeout was performed prior to the initiation of the procedure. The right groin was prepped and draped in the usual sterile fashion. Using a micropuncture kit and the modified Seldinger technique, access was gained to the right common femoral artery and an 8 French sheath was placed. Real-time ultrasound guidance was utilized for vascular access including the acquisition of a permanent ultrasound image documenting patency of the accessed vessel. Under fluoroscopy, a 5 [place] Berenstein 2 catheter was navigated over a 0.035" Terumo Glidewire into the aortic arch. The catheter was placed into the right common carotid artery. Frontal and lateral angiograms of the neck were obtained. Using biplane roadmap, the catheter was advanced into the right internal carotid artery. Frontal, lateral, magnified right anterior oblique and magnified lateral views of the head were obtained. The catheter was then retracted into  the right subclavian artery. Frontal and lateral angiograms of the neck were obtained followed by a cranial oblique view of the neck. Next, the catheter was placed into the left common carotid artery. Angiograms with frontal, lateral and bilateral oblique views of the neck were obtained. Then, angiograms with frontal, lateral, magnified left anterior oblique, magnified lateral and magnified waters views of the head were obtained. The catheter was then placed into the left subclavian artery. Frontal and lateral angiograms of the neck were obtained. Under roadmap guidance, the catheter was advanced into the left vertebral artery. Angiograms with frontal, lateral, water's and magnified oblique views of the head were obtained. The catheter was subsequently withdrawn. A right common femoral artery angiograms in right anterior oblique view was obtained via sheath side port contrast injection. The puncture is at the level of the common femoral artery which has adequate caliber for closure device. A 5 [place] ExoSeal was utilized for access closure. Adequate hemostasis was achieved. FINDINGS: Right CCA angiograms: Calcified atherosclerotic plaque in the right carotid bifurcation without hemodynamically significant stenosis. Right ICA angiograms: Luminal irregularity of the cavernous and supraclinoid segment of right ICA, consistent with intracranial atherosclerotic disease. There is approximately 40% stenosis at the communicating segment of the right ICA. There is brisk vascular contrast filling of the right ACA and MCA vascular trees. No aneurysms or abnormally high-flow, early draining veins are seen. No regions of abnormal hypervascularity are noted. The visualized dural sinuses are patent. Right subclavian angiograms: Luminal irregularity of the right subclavian artery without hemodynamically significant stenosis. There is severe stenosis at the origin of the right vertebral artery with delayed filling and delayed  washout. Left CCA angiograms: Heavily calcified plaque in the left carotid bifurcation resulting in 65% stenosis. Left ICA angiograms: Luminal irregularity of the cavernous and supraclinoid left ICA consistent with intracranial atherosclerotic disease with approximately 40% stenosis at the paraclinoid segment. There is brisk vascular contrast filling of the left ACA and MCA vascular trees. No aneurysms or abnormally high-flow, early draining veins are seen. No regions of abnormal hypervascularity are noted. The visualized dural sinuses are patent. Left subclavian angiograms: The left subclavian artery, proximal left vertebral artery and visualized branches of the thyrocervical trunk are unremarkable. Left vertebral artery angiograms: The left  vertebral artery, basilar artery, and bilateral posterior cerebral arteries are unremarkable. No contrast reflux seen into the right vertebral artery. Luminal caliber is smooth and tapering. No aneurysms or abnormally high-flow, early draining veins are seen. No regions of abnormal hypervascularity are noted. The visualized dural sinuses are patent. Right common femoral artery angiograms: The access is at the right common femoral artery. The femoral artery has normal caliber, adequate for closure device. PROCEDURE: No intervention performed. IMPRESSION: 1. Atherosclerotic changes of the left carotid bifurcation with calcified plaque resulting in approximately 65% stenosis. 2. Atherosclerotic changes of the right carotid bifurcation without hemodynamically significant stenosis. 3. Atherosclerotic changes of the bilateral intracranial carotid arteries with approximately 40% stenosis at the communicating segment of the right ICA and 40% stenosis at the paraclinoid segment of the left ICA. 4. 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 No opacification of intracranial segment, consistent with occlusion seen on prior CT angiogram. PLAN:  Findings discussed with Dr. Lottie Rater. No intervention required at the moment. Electronically Signed   By: Pedro Earls M.D.   On: 02/20/2021 16:21   IR ANGIO VERTEBRAL SEL SUBCLAVIAN INNOMINATE UNI R MOD SED  Result Date: 02/20/2021 INDICATION: Brent Young is a 59 y.o. male with a past medical history significant for arthritis, DM, HTN, previous lacunar strokes who presented to [facility] ED on 02/16/21 with complaints of left arm and leg weakness. CT angiogram of the head and neck showed severe intracranial atherosclerotic disease with questionable degree of narrowing of the bilateral carotid siphons. He comes to our service today for a diagnostic cerebral angiogram to better evaluate CT angiography findings. EXAM: ULTRASOUND-GUIDED VASCULAR ACCESS DIAGNOSTIC CEREBRAL ANGIOGRAM COMPARISON:  CT angiogram February 16, 2021. MEDICATIONS: No antibiotics utilized. ANESTHESIA/SEDATION: Moderate (conscious) sedation was employed during this procedure. A total of 1 mg of versed and 50 mcg of fentanyl or administered intravenously by the radiology nurse. Total intra-service moderate Sedation Time: 50 minutes. The patient's level of consciousness and vital signs were monitored continuously by radiology nursing throughout the procedure under my direct supervision. CONTRAST:  96 mL of Omnipaque 300 milligram/mL FLUOROSCOPY TIME:  Fluoroscopy Time: 8 minutes 36 seconds (138 mGy). COMPLICATIONS: None immediate. TECHNIQUE: Informed written consent was obtained from the patient after a thorough discussion of the procedural risks, benefits and alternatives. All questions were addressed. Maximal Sterile Barrier Technique was utilized including caps, mask, sterile gowns, sterile gloves, sterile drape, hand hygiene and skin antiseptic. A timeout was performed prior to the initiation of the procedure. The right groin was prepped and draped in the usual sterile fashion. Using a micropuncture kit and the modified  Seldinger technique, access was gained to the right common femoral artery and an 8 French sheath was placed. Real-time ultrasound guidance was utilized for vascular access including the acquisition of a permanent ultrasound image documenting patency of the accessed vessel. Under fluoroscopy, a 5 [place] Berenstein 2 catheter was navigated over a 0.035" Terumo Glidewire into the aortic arch. The catheter was placed into the right common carotid artery. Frontal and lateral angiograms of the neck were obtained. Using biplane roadmap, the catheter was advanced into the right internal carotid artery. Frontal, lateral, magnified right anterior oblique and magnified lateral views of the head were obtained. The catheter was then retracted into the right subclavian artery. Frontal and lateral angiograms of the neck were obtained followed by a cranial oblique view of the neck. Next, the catheter was placed into the left  common carotid artery. Angiograms with frontal, lateral and bilateral oblique views of the neck were obtained. Then, angiograms with frontal, lateral, magnified left anterior oblique, magnified lateral and magnified waters views of the head were obtained. The catheter was then placed into the left subclavian artery. Frontal and lateral angiograms of the neck were obtained. Under roadmap guidance, the catheter was advanced into the left vertebral artery. Angiograms with frontal, lateral, water's and magnified oblique views of the head were obtained. The catheter was subsequently withdrawn. A right common femoral artery angiograms in right anterior oblique view was obtained via sheath side port contrast injection. The puncture is at the level of the common femoral artery which has adequate caliber for closure device. A 5 [place] ExoSeal was utilized for access closure. Adequate hemostasis was achieved. FINDINGS: Right CCA angiograms: Calcified atherosclerotic plaque in the right carotid bifurcation without  hemodynamically significant stenosis. Right ICA angiograms: Luminal irregularity of the cavernous and supraclinoid segment of right ICA, consistent with intracranial atherosclerotic disease. There is approximately 40% stenosis at the communicating segment of the right ICA. There is brisk vascular contrast filling of the right ACA and MCA vascular trees. No aneurysms or abnormally high-flow, early draining veins are seen. No regions of abnormal hypervascularity are noted. The visualized dural sinuses are patent. Right subclavian angiograms: Luminal irregularity of the right subclavian artery without hemodynamically significant stenosis. There is severe stenosis at the origin of the right vertebral artery with delayed filling and delayed washout. Left CCA angiograms: Heavily calcified plaque in the left carotid bifurcation resulting in 65% stenosis. Left ICA angiograms: Luminal irregularity of the cavernous and supraclinoid left ICA consistent with intracranial atherosclerotic disease with approximately 40% stenosis at the paraclinoid segment. There is brisk vascular contrast filling of the left ACA and MCA vascular trees. No aneurysms or abnormally high-flow, early draining veins are seen. No regions of abnormal hypervascularity are noted. The visualized dural sinuses are patent. Left subclavian angiograms: The left subclavian artery, proximal left vertebral artery and visualized branches of the thyrocervical trunk are unremarkable. Left vertebral artery angiograms: The left vertebral artery, basilar artery, and bilateral posterior cerebral arteries are unremarkable. No contrast reflux seen into the right vertebral artery. Luminal caliber is smooth and tapering. No aneurysms or abnormally high-flow, early draining veins are seen. No regions of abnormal hypervascularity are noted. The visualized dural sinuses are patent. Right common femoral artery angiograms: The access is at the right common femoral artery. The  femoral artery has normal caliber, adequate for closure device. PROCEDURE: No intervention performed. IMPRESSION: 1. Atherosclerotic changes of the left carotid bifurcation with calcified plaque resulting in approximately 65% stenosis. 2. Atherosclerotic changes of the right carotid bifurcation without hemodynamically significant stenosis. 3. Atherosclerotic changes of the bilateral intracranial carotid arteries with approximately 40% stenosis at the communicating segment of the right ICA and 40% stenosis at the paraclinoid segment of the left ICA. 4. 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 No opacification of intracranial segment, consistent with occlusion seen on prior CT angiogram. PLAN: Findings discussed with Dr. Lottie Rater. No intervention required at the moment. Electronically Signed   By: Pedro Earls M.D.   On: 02/20/2021 16:21   IR ANGIO VERTEBRAL SEL VERTEBRAL UNI L MOD SED  Result Date: 02/20/2021 INDICATION: Brent Young is a 60 y.o. male with a past medical history significant for arthritis, DM, HTN, previous lacunar strokes who presented to [facility] ED on 02/16/21 with complaints of left  arm and leg weakness. CT angiogram of the head and neck showed severe intracranial atherosclerotic disease with questionable degree of narrowing of the bilateral carotid siphons. He comes to our service today for a diagnostic cerebral angiogram to better evaluate CT angiography findings. EXAM: ULTRASOUND-GUIDED VASCULAR ACCESS DIAGNOSTIC CEREBRAL ANGIOGRAM COMPARISON:  CT angiogram February 16, 2021. MEDICATIONS: No antibiotics utilized. ANESTHESIA/SEDATION: Moderate (conscious) sedation was employed during this procedure. A total of 1 mg of versed and 50 mcg of fentanyl or administered intravenously by the radiology nurse. Total intra-service moderate Sedation Time: 50 minutes. The patient's level of consciousness and vital signs were monitored  continuously by radiology nursing throughout the procedure under my direct supervision. CONTRAST:  96 mL of Omnipaque 300 milligram/mL FLUOROSCOPY TIME:  Fluoroscopy Time: 8 minutes 36 seconds (138 mGy). COMPLICATIONS: None immediate. TECHNIQUE: Informed written consent was obtained from the patient after a thorough discussion of the procedural risks, benefits and alternatives. All questions were addressed. Maximal Sterile Barrier Technique was utilized including caps, mask, sterile gowns, sterile gloves, sterile drape, hand hygiene and skin antiseptic. A timeout was performed prior to the initiation of the procedure. The right groin was prepped and draped in the usual sterile fashion. Using a micropuncture kit and the modified Seldinger technique, access was gained to the right common femoral artery and an 8 French sheath was placed. Real-time ultrasound guidance was utilized for vascular access including the acquisition of a permanent ultrasound image documenting patency of the accessed vessel. Under fluoroscopy, a 5 [place] Berenstein 2 catheter was navigated over a 0.035" Terumo Glidewire into the aortic arch. The catheter was placed into the right common carotid artery. Frontal and lateral angiograms of the neck were obtained. Using biplane roadmap, the catheter was advanced into the right internal carotid artery. Frontal, lateral, magnified right anterior oblique and magnified lateral views of the head were obtained. The catheter was then retracted into the right subclavian artery. Frontal and lateral angiograms of the neck were obtained followed by a cranial oblique view of the neck. Next, the catheter was placed into the left common carotid artery. Angiograms with frontal, lateral and bilateral oblique views of the neck were obtained. Then, angiograms with frontal, lateral, magnified left anterior oblique, magnified lateral and magnified waters views of the head were obtained. The catheter was then placed  into the left subclavian artery. Frontal and lateral angiograms of the neck were obtained. Under roadmap guidance, the catheter was advanced into the left vertebral artery. Angiograms with frontal, lateral, water's and magnified oblique views of the head were obtained. The catheter was subsequently withdrawn. A right common femoral artery angiograms in right anterior oblique view was obtained via sheath side port contrast injection. The puncture is at the level of the common femoral artery which has adequate caliber for closure device. A 5 [place] ExoSeal was utilized for access closure. Adequate hemostasis was achieved. FINDINGS: Right CCA angiograms: Calcified atherosclerotic plaque in the right carotid bifurcation without hemodynamically significant stenosis. Right ICA angiograms: Luminal irregularity of the cavernous and supraclinoid segment of right ICA, consistent with intracranial atherosclerotic disease. There is approximately 40% stenosis at the communicating segment of the right ICA. There is brisk vascular contrast filling of the right ACA and MCA vascular trees. No aneurysms or abnormally high-flow, early draining veins are seen. No regions of abnormal hypervascularity are noted. The visualized dural sinuses are patent. Right subclavian angiograms: Luminal irregularity of the right subclavian artery without hemodynamically significant stenosis. There is severe stenosis at the origin of  the right vertebral artery with delayed filling and delayed washout. Left CCA angiograms: Heavily calcified plaque in the left carotid bifurcation resulting in 65% stenosis. Left ICA angiograms: Luminal irregularity of the cavernous and supraclinoid left ICA consistent with intracranial atherosclerotic disease with approximately 40% stenosis at the paraclinoid segment. There is brisk vascular contrast filling of the left ACA and MCA vascular trees. No aneurysms or abnormally high-flow, early draining veins are seen. No  regions of abnormal hypervascularity are noted. The visualized dural sinuses are patent. Left subclavian angiograms: The left subclavian artery, proximal left vertebral artery and visualized branches of the thyrocervical trunk are unremarkable. Left vertebral artery angiograms: The left vertebral artery, basilar artery, and bilateral posterior cerebral arteries are unremarkable. No contrast reflux seen into the right vertebral artery. Luminal caliber is smooth and tapering. No aneurysms or abnormally high-flow, early draining veins are seen. No regions of abnormal hypervascularity are noted. The visualized dural sinuses are patent. Right common femoral artery angiograms: The access is at the right common femoral artery. The femoral artery has normal caliber, adequate for closure device. PROCEDURE: No intervention performed. IMPRESSION: 1. Atherosclerotic changes of the left carotid bifurcation with calcified plaque resulting in approximately 65% stenosis. 2. Atherosclerotic changes of the right carotid bifurcation without hemodynamically significant stenosis. 3. Atherosclerotic changes of the bilateral intracranial carotid arteries with approximately 40% stenosis at the communicating segment of the right ICA and 40% stenosis at the paraclinoid segment of the left ICA. 4. 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 No opacification of intracranial segment, consistent with occlusion seen on prior CT angiogram. PLAN: Findings discussed with Dr. Lottie Rater. No intervention required at the moment. Electronically Signed   By: Pedro Earls M.D.   On: 02/20/2021 16:21    DISCHARGE EXAMINATION: Vitals:   02/27/21 0323 02/27/21 0718 02/27/21 0752 02/27/21 1123  BP: (!) 148/88 (!) 154/82 (!) 160/80 129/78  Pulse: 85 89 90 100  Resp: 17 16 20 18   Temp: 97.6 F (36.4 C)  98 F (36.7 C) 97.9 F (36.6 C)  TempSrc: Axillary  Oral Oral  SpO2: 95% 96% 98% (!)  83%  Weight:      Height:       See progress note from earlier today  DISPOSITION: CIR  Discharge Instructions     Ambulatory referral to Neurology   Complete by: As directed    Follow up with stroke clinic NP (Jessica Vanschaick or Cecille Rubin, if both not available, consider Zachery Dauer, or [place]) at [facility] in about 4 weeks. Thanks.        Current Inpatient Medications: Scheduled:   stroke: mapping our early stages of recovery book   Does not apply Once   acetaminophen  1,000 mg Oral Once   amLODipine  10 mg Oral Daily   aspirin EC  325 mg Oral Daily   atorvastatin  40 mg Oral Daily   clopidogrel  75 mg Oral Daily   enoxaparin (LOVENOX) injection  40 mg Subcutaneous Q24H   furosemide  40 mg Intravenous Once   [START ON 02/28/2021] furosemide  20 mg Oral Daily   gabapentin  300 mg Oral TID   insulin aspart  0-9 Units Subcutaneous TID WC   insulin starter kit- pen needles  1 kit Other Once   irbesartan  75 mg Oral Daily   linagliptin  5 mg Oral Daily   living well with diabetes book   Does not apply Once  mouth rinse  15 mL Mouth Rinse BID   metFORMIN  1,000 mg Oral BID WC   metoprolol succinate  25 mg Oral Daily   Oxcarbazepine  300 mg Oral TID   [START ON 02/28/2021] potassium chloride  20 mEq Oral Daily   potassium chloride  40 mEq Oral Once   Continuous: [id number] **OR** acetaminophen (TYLENOL) oral liquid 160 mg/5 mL **OR** acetaminophen, hydrALAZINE, oxyCODONE    Follow-up Information     [name]. Schedule an appointment as soon as possible for a visit in 1 month(s).   Specialty: Neurology Why: stroke clinic Contact information: [address] [place] [phone]                TOTAL DISCHARGE TIME: 35 minutes  [place]  [facility] Pager on [email or web address]  02/27/2021, 12:04 PM

## 2021-02-27 NOTE — IPOC Note (Signed)
Overall Plan of Care [facility]) Patient Details Name: Brent Young MRN: [id number] DOB: 03-26-1960  Admitting Diagnosis: Infarction of right basal ganglia [facility])  Hospital Problems: Principal Problem:   Infarction of right basal ganglia (HCC)     Functional Problem List: Nursing Edema, Endurance, Medication Management, Perception, Safety, Skin Integrity  PT Balance, Safety, Sensory, Endurance, Motor, Pain, Perception  OT Balance, Endurance, Motor, Sensory, Perception  SLP Nutrition  TR         Basic ADL's: OT Grooming, Bathing, Dressing, Toileting     Advanced  ADL's: OT       Transfers: PT Bed Mobility, Bed to Chair, Car, Occupational psychologist, Research scientist (life sciences): PT Ambulation, Psychologist, prison and probation services, Stairs     Additional Impairments: OT Fuctional Use of Upper Extremity  SLP Swallowing      TR      Anticipated Outcomes Item Anticipated Outcome  Self Feeding independent  Swallowing  Mod I   Basic self-care  MIn A  Toileting  Min A   Bathroom Transfers Min A  Bowel/Bladder  n/a  Transfers  min assist basic, mod assist car  Locomotion  anticipate wc as primary mode of functinal mobility  Communication     Cognition     Pain  n/a  Safety/Judgment  min assist and no falls   Therapy Plan: PT Intensity: Minimum of 1-2 x/day ,45 to 90 minutes PT Frequency: 5 out of 7 days PT Duration Estimated Length of Stay: 4 weeks OT Intensity: Minimum of 1-2 x/day, 45 to 90 minutes OT Frequency: 5 out of 7 days OT Duration/Estimated Length of Stay: 28-30 days SLP Intensity: Minumum of 1-2 x/day, 30 to 90 minutes SLP Frequency: 3 to 5 out of 7 days SLP Duration/Estimated Length of Stay: 28-30 days for shorter for SLP   Due to the current state of emergency, patients may not be receiving their 3-hours of Medicare-mandated therapy.   Team Interventions: Nursing Interventions Patient/Family Education, Disease Management/Prevention, Medication Management,  Skin Care/Wound Management, Discharge Planning  PT interventions Ambulation/gait training, Community reintegration, DME/adaptive equipment instruction, Neuromuscular re-education, Psychosocial support, Stair training, UE/LE Strength taining/ROM, Wheelchair propulsion/positioning, Warden/ranger, Discharge planning, Functional electrical stimulation, Pain management, Therapeutic Activities, UE/LE Coordination activities, Disease management/prevention, Functional mobility training, Patient/family education, Splinting/orthotics, Therapeutic Exercise  OT Interventions Balance/vestibular training, Discharge planning, Functional mobility training, DME/adaptive equipment instruction, Self Care/advanced ADL retraining, Therapeutic Activities, Therapeutic Exercise, UE/LE Strength taining/ROM, UE/LE Coordination activities, Visual/perceptual remediation/compensation, Patient/family education, Functional electrical stimulation, Pain management, Neuromuscular re-education  SLP Interventions Dysphagia/aspiration precaution training, Environmental controls, Cueing hierarchy, Therapeutic Activities, Functional tasks, Patient/family education  TR Interventions    SW/CM Interventions Discharge Planning, Psychosocial Support, Patient/Family Education   Barriers to Discharge MD  Medical stability  Nursing Decreased caregiver support, Home environment access/layout, Wound Care, Lack of/limited family support, Weight, Medication compliance Lives in 2 level home with 5 steps to enter and right rail. Able to live on main level with bedroom/bathroom. Lives with mom, sister, and son. Family can provide up to mod assist with mobility/ADL's at home.  PT Inaccessible home environment, Decreased caregiver support, Home environment access/layout, Weight, New oxygen dense hemiplegia, morbid obesity  OT      SLP      SW       Team Discharge Planning: Destination: PT-Home ,OT- Home , SLP-Home Projected Follow-up:  PT-Home health PT, 24 hour supervision/assistance, OT-  Home health OT, SLP-None Projected Equipment Needs: PT-To be determined, OT- 3 in 1 bedside  comode, To be determined, SLP-None recommended by SLP Equipment Details: PT-will likely need custom vs PWC, OT-  Patient/family involved in discharge planning: PT- Patient,  OT-Patient, SLP-Patient  MD ELOS: 27-30 days Medical Rehab Prognosis:  Excellent Assessment: The patient has been admitted for CIR therapies with the diagnosis of right basal ganglia infarct. The team will be addressing functional mobility, strength, stamina, balance, safety, adaptive techniques and equipment, self-care, bowel and bladder mgt, patient and caregiver education, NMR, pain mgt, swallowing, speech, community reentry. Goals have been set at min assist for self-care and mobility and mod I for swallowing and communication.   Due to the current state of emergency, patients may not be receiving their 3 hours per day of Medicare-mandated therapy.    Ranelle Oyster, MD, FAAPMR     See Team Conference Notes for weekly updates to the plan of care

## 2021-02-27 NOTE — H&P (Signed)
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  Per chart review patient lives with spouse.  Two-level home bed and bath upstairs 3 steps to entry.  Presented 02/16/2021 with acute onset of left-sided weakness and he did roll out of his bed without loss of consciousness.  Admission chemistries unremarkable potassium 3.1, glucose 181, troponin 37-55, [id number].  Cranial CT scan negative.  CT angiogram head and neck poor opacification of right vertebral artery throughout its course with increased focal stenosis just distal to its takeoff.  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  Patient did not receive tPA.  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  Cerebral angiogram completed showed left ICA 65 to 70% stenosis, bilateral ICA siphon mild to moderate stenosis right VA stenosis.  Placed on aspirin 325 mg daily and Plavix 75 mg daily for 3 months then aspirin alone due to intracranial stenosis.  Echocardiogram with ejection fraction of 60 to 65% no wall motion abnormalities.  Elevated troponin felt to be secondary to demand ischemia.  Lovenox was added for DVT prophylaxis.  Presently on dysphagia #2 thin liquid diet.  Patient with persistent right shoulder pain x-rays without fracture or dislocation consistent with mild AC joint arthritis with supportive care.  Therapy evaluations completed due to patient's left-sided weakness decreased functional mobility was admitted for a comprehensive rehab program.     Pt reports his  LBM was 7-10+ days ago-however it's documented as 12/6 BM. He also  feels bloated and constipated- took prune juice yesterday, tried bedpan today but didn't go. That's also documented.  Notes using urinal to pee, but hard with 1 arm.  Sleeps better with O2- so asks for it in hospital.  Mild R shoulder pain from fall- was bad, but today is better.    Notes has D2 thin diet- wife before she passed 10+ years ago was a Astronomer, Juluis Rainier.      Review of Systems  Constitutional:  Negative for chills and fever.  HENT:  Negative for hearing loss.   Eyes:  Negative for blurred vision and double vision.  Respiratory:  Negative for cough and shortness of breath.   Cardiovascular:  Negative for chest pain, palpitations and leg swelling.  Gastrointestinal:  Positive for constipation. Negative for heartburn, nausea and vomiting.  Genitourinary:  Negative for dysuria, flank pain and hematuria.  Musculoskeletal:  Positive for joint pain and myalgias.  Skin:  Negative for rash.  Neurological:  Positive for weakness.  All other systems reviewed and are negative.     Past Medical History:  Diagnosis Date   Allergy     Arthritis     Diabetes mellitus without complication ([place])     Hypertension     Stroke [facility])           Past Surgical History:  Procedure Laterality Date   FRACTURE SURGERY Left      Patient fractured left arm   IR ANGIO INTRA EXTRACRAN SEL COM CAROTID INNOMINATE UNI L MOD SED  02/19/2021   IR ANGIO INTRA EXTRACRAN SEL INTERNAL CAROTID UNI R MOD SED   02/19/2021   IR ANGIO VERTEBRAL SEL SUBCLAVIAN INNOMINATE UNI R MOD SED   02/19/2021   IR ANGIO VERTEBRAL SEL VERTEBRAL UNI L MOD SED   02/19/2021   IR US GUIDE VASC ACCESS RIGHT   02/19/2021         Family History  Problem Relation Age of Onset   Colon cancer Maternal Grandmother     Esophageal cancer Neg Hx     Stomach cancer Neg Hx     Rectal cancer Neg Hx      Social History:  reports that he has been smoking  cigarettes. He has never used smokeless tobacco. He reports current alcohol use. He reports current drug use. Drug: Marijuana. Allergies: No Known Allergies       Medications Prior to Admission  Medication Sig Dispense Refill   amLODipine (NORVASC) 10 MG tablet Take 1 tablet (10 mg total) by mouth daily. 90 tablet 1   atorvastatin (LIPITOR) 10 MG tablet Take 1 tablet (10 mg total) by mouth daily. 90 tablet 3   ergocalciferol (VITAMIN D2) 1.25 MG (50000 UT) capsule Take 50,000 Units by mouth every Wednesday.       gabapentin (NEURONTIN) 300 MG capsule TAKE 1 CAPSULE BY MOUTH THREE TIMES A DAY (Patient taking differently: Take 300 mg by mouth 3 (three) times daily.) 90 capsule 1   glimepiride (AMARYL) 4 MG tablet Take 1 tablet (4 mg total) by mouth every morning. 90 tablet 1   hydrochlorothiazide (HYDRODIURIL) 25 MG tablet Take 1 tablet (25 mg total) by mouth daily. 90 tablet 3   losartan (COZAAR) 25 MG tablet Take 1 tablet (25 mg total) by mouth daily. 90 tablet 1   metFORMIN (GLUCOPHAGE) 1000 MG tablet Take 1 tablet (1,000 mg total) by mouth 2 (two) times daily with a meal. 180 tablet 3   metoprolol succinate (TOPROL-XL) 25 MG 24 hr tablet Take 25 mg by mouth 3 (three) times daily.       Oxcarbazepine (TRILEPTAL) 300 MG tablet Take 1 tablet (300 mg total) by mouth 3 (three) times daily. 270 tablet 1   sitaGLIPtin (JANUVIA) 50 MG tablet Take 1 tablet (50 mg total) by mouth daily. 90 tablet 0      Drug Regimen Review Drug regimen was reviewed and remains appropriate with no significant issues identified.   Home: Home Living Family/patient expects to be discharged to:: Private residence Living Arrangements: Spouse/significant other, Children, Other relatives Available Help at Discharge: Family, Available 24 hours/day Type of Home: House Home Access: Stairs to enter [employer] of Steps: 2-3 Home Layout: Two level, Bed/bath upstairs Alternate Level Stairs-Number of Steps:  flight Bathroom Shower/Tub: Engineer, manufacturing systems: Standard Home Equipment: Agricultural consultant (2 wheels), [employer] - single point, Software engineer History: Prior Function Prior Level of Function : Independent/Modified Independent Mobility Comments: was not using AD ADLs Comments: son does assist with housework IADLs   Functional Status:  Mobility: Bed Mobility Overal bed mobility: Needs Assistance Bed Mobility: Rolling, Sidelying to Sit, Sit to Sidelying Rolling: Mod assist, +2 for physical assistance Sidelying to sit: Max assist, +2 for physical assistance Supine to sit: Max assist, +2 for physical assistance, HOB elevated Sit to sidelying: Max assist, +2 for physical assistance General bed mobility comments: Pt modA to roll with improved initiation today, easier to roll to L than R, pt able to help initiate but needs assistance following  through, assist with trunk and BLE management when going sidelying<>sit Transfers Overall transfer level: Needs assistance Equipment used: Ambulation equipment used Transfers: Sit to/from Stand, Bed to chair/wheelchair/BSC Sit to Stand: Max assist, +2 physical assistance, From elevated surface Bed to/from chair/wheelchair/BSC transfer type:: Via Lift equipment Transfer via Lift Equipment: Loma Messing transfer comment: Tamala Ser for STS, pt able to stand ~3 minutes, utilized maximove to transfer patient bed>chair as pt was fatigued after initial standing trial; cues for safe hand placement, heavy posterior and L lean requiring maxA+2 to correct   ADL: ADL Overall ADL's : Needs assistance/impaired Eating/Feeding: Set up, Sitting Grooming: Moderate assistance, Sitting Upper Body Bathing: Moderate assistance, Sitting Lower Body Bathing: Maximal assistance, +2 for physical assistance, +2 for safety/equipment, Sit to/from stand Upper Body Dressing : Moderate assistance, Sitting Lower Body Dressing: Maximal  assistance, +2 for physical assistance, +2 for safety/equipment, Sit to/from stand Toilet Transfer: Maximal assistance, +2 for physical assistance, +2 for safety/equipment Toilet Transfer Details (indicate cue type and reason): simulated with use of steady this session Toileting- Clothing Manipulation and Hygiene: Maximal assistance, Bed level Toileting - Clothing Manipulation Details (indicate cue type and reason): max A at bed level; NT assisting pt upon arrival. overall max A for hygiene Functional mobility during ADLs: Maximal assistance, +2 for physical assistance, +2 for safety/equipment General ADL Comments: session focused on ROM of LUE, sitting balance, standing with steady frame and chair transfer   Cognition: Cognition Overall Cognitive Status: Within Functional Limits for tasks assessed Orientation Level: Oriented X4 Cognition Arousal/Alertness: Awake/alert Behavior During Therapy: WFL for tasks assessed/performed Overall Cognitive Status: Within Functional Limits for tasks assessed General Comments: Very motivated for therapy   Physical Exam: Blood pressure (!) 160/80, pulse 90, temperature 98 F (36.7 C), temperature source Oral, resp. rate 20, height 6\' 1"  (1.854 m), weight 124.7 kg, SpO2 98 %. Physical Exam Vitals and nursing note reviewed. Exam conducted with a chaperone present.  Constitutional:      Comments: Pt with BMI of 36 sitting up in bed; nurse at bedside; wearing PRAFO on LLE; tearful at times, NAD  HENT:     Head: Normocephalic.     Comments: Mild L facial droop-O2 was supposed to be in place, but was on floor-     Right Ear: External ear normal.     Left Ear: External ear normal.     Nose: Nose normal. No congestion.     Mouth/Throat:     Mouth: Mucous membranes are dry.     Pharynx: Oropharynx is clear. No oropharyngeal exudate.  Eyes:     General:        Right eye: No discharge.        Left eye: No discharge.     Extraocular Movements: Extraocular  movements intact.  Cardiovascular:     Rate and Rhythm: Normal rate and regular rhythm.     Heart sounds: Normal heart sounds. No murmur heard.   No gallop.  Pulmonary:     Comments: Decreased at bases B/L- otherwise, No W/R/R Abdominal:     Comments: Protuberant vs distended; NT; soft; hypoactive  Musculoskeletal:     Cervical back: Normal range of motion. No rigidity.     Comments: RUE/RLE 5/5 LUE- 2/5 proximally and 2-/5 distally LLE- 0/5 in all muscles tested TTP over R shoulder- top of shoulder around [facility] joint  Skin:    General: Skin is warm and dry.     Comments: Dry flaking heels-  Abrasions/scrapes  on L 2nd/3rd top of toes Rug burns on knees B/L  R forearm IV- looks OK  Neurological:     Comments: Patient is alert.  Makes eye contact with examiner.  Follows simple commands.  Provides name and age Pt has dysarthria- not always notable, but at least 75% of time is noticeable.  Mild decrease to light touch on L side throughout.   Psychiatric:     Comments: Tearful at times- but calm otherwise; depressed affect      Lab Results Last 48 Hours        Results for orders placed or performed during the hospital encounter of 02/16/21 (from the past 48 hour(s))  Glucose, capillary     Status: Abnormal    Collection Time: 02/25/21 11:55 AM  Result Value Ref Range    Glucose-Capillary 167 (H) 70 - 99 mg/dL      Comment: Glucose reference range applies only to samples taken after fasting for at least 8 hours.    Comment 1 Notify RN      Comment 2 Document in Chart    Glucose, capillary     Status: Abnormal    Collection Time: 02/25/21  4:18 PM  Result Value Ref Range    Glucose-Capillary 137 (H) 70 - 99 mg/dL      Comment: Glucose reference range applies only to samples taken after fasting for at least 8 hours.    Comment 1 Notify RN      Comment 2 Document in Chart    Glucose, capillary     Status: Abnormal    Collection Time: 02/26/21  8:38 AM  Result Value Ref Range     Glucose-Capillary 157 (H) 70 - 99 mg/dL      Comment: Glucose reference range applies only to samples taken after fasting for at least 8 hours.  Glucose, capillary     Status: Abnormal    Collection Time: 02/26/21 12:26 PM  Result Value Ref Range    Glucose-Capillary 163 (H) 70 - 99 mg/dL      Comment: Glucose reference range applies only to samples taken after fasting for at least 8 hours.  Glucose, capillary     Status: Abnormal    Collection Time: 02/26/21  4:54 PM  Result Value Ref Range    Glucose-Capillary 106 (H) 70 - 99 mg/dL      Comment: Glucose reference range applies only to samples taken after fasting for at least 8 hours.  Glucose, capillary     Status: Abnormal    Collection Time: 02/26/21  9:06 PM  Result Value Ref Range    Glucose-Capillary 107 (H) 70 - 99 mg/dL      Comment: Glucose reference range applies only to samples taken after fasting for at least 8 hours.  CBC     Status: Abnormal    Collection Time: 02/27/21  3:46 AM  Result Value Ref Range    WBC 8.1 4.0 - 10.5 K/uL    RBC 4.23 4.22 - 5.81 MIL/uL    Hemoglobin 12.8 (L) 13.0 - 17.0 g/dL    HCT 39.9 39.0 - 52.0 %    MCV 94.3 80.0 - 100.0 fL    MCH 30.3 26.0 - 34.0 pg    MCHC 32.1 30.0 - 36.0 g/dL    RDW 15.5 11.5 - 15.5 %    Platelets 262 150 - 400 K/uL    nRBC 0.0 0.0 - 0.2 %      Comment: Performed at [facility]  [facility], [place] [address], [place], [place] [id number]  Basic metabolic panel     Status: Abnormal    Collection Time: 02/27/21  3:46 AM  Result Value Ref Range    Sodium 137 135 - 145 mmol/L    Potassium 3.6 3.5 - 5.1 mmol/L    Chloride 97 (L) 98 - 111 mmol/L    CO2 33 (H) 22 - 32 mmol/L    Glucose, Bld 101 (H) 70 - 99 mg/dL      Comment: Glucose reference range applies only to samples taken after fasting for at least 8 hours.    BUN 15 8 - 23 mg/dL    Creatinine, Ser 0.68 0.61 - 1.24 mg/dL    Calcium 9.1 8.9 - 10.3 mg/dL    GFR, Estimated >60 >60 mL/min      Comment: (NOTE) Calculated  using the CKD-EPI Creatinine Equation (2021)      Anion gap 7 5 - 15      Comment: Performed at [facility] [address] [place], [place] [address]  Glucose, capillary     Status: Abnormal    Collection Time: 02/27/21  6:17 AM  Result Value Ref Range    Glucose-Capillary 106 (H) 70 - 99 mg/dL      Comment: Glucose reference range applies only to samples taken after fasting for at least 8 hours.      Imaging Results (Last 48 hours)  No results found.           Medical Problem List and Plan: 1. 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  Antithrombotics: -DVT/anticoagulation:  Pharmaceutical: Lovenox             -antiplatelet therapy: Aspirin 325 mg daily and Plavix 75 mg daily x3 months then aspirin alone 3. Pain Management: R shoulder pain from fall-Neurontin 300 mg 3 times daily, oxycodone as needed 4. Mood:              -antipsychotic agents: N/A 5. Neuropsych: This patient is capable of making decisions on his own behalf. 6. Skin/Wound Care: Routine skin checks 7. Fluids/Electrolytes/Nutrition: Routine in and out with follow-up chemistries 8.  Dysphagia.  Dysphagia #2 thin liquids.  Follow-up speech therapy 9.  Hypertension.  Norvasc 10 mg daily Avapro 75 mg daily, Toprol-XL 25 mg daily 10.  Diabetes mellitus.  Hemoglobin A1c 9.9.  SSI, Tradjenta 5 mg daily, Glucophage 1000 mg twice daily.  Check blood sugars before meals and at bedtime.  Diabetic teaching 11.  Hyperlipidemia.  Lipitor 12.  Tobacco use.  Counseling 13.  Seizure prophylaxis.  Trileptal 300 mg 3 times daily 14. Constipation- pt says no BM in 1+ weeks- 2 are documented- not sure if is constipated since input is likely more than output.  15. Pulmonary edema- has been using 2L O2 as  needed- CXR 12/8 shows mild pulmonary edema- per primary team.    I have personally performed a face to face diagnostic evaluation of this patient and formulated the key components of the plan.  Additionally, I have personally reviewed laboratory data, imaging studies, as well as relevant notes and concur with the physician assistant's documentation above.  The patient's status has not changed from the original H&P.  Any changes in documentation from the acute care chart have been noted above.         Lavon Paganini Angiulli, PA-C 02/27/2021

## 2021-02-27 NOTE — TOC Transition Note (Signed)
Transition of Care [facility]) - CM/SW Discharge Note   Patient Details  Name: Brent Young MRN: [id number] Date of Birth: January 31, 1961  [facility] CM/SW Contact:  Kermit Balo, RN Phone Number: 02/27/2021, 11:17 AM   Clinical Narrative:    Patient is discharging to CIR today. CM signing off.    Final next level of care: IP Rehab Facility Barriers to Discharge: Inadequate or no insurance, Barriers Unresolved (comment)   Patient Goals and CMS Choice     Choice offered to / list presented to : Patient  Discharge Placement                       Discharge Plan and Services                                     Social Determinants of Health (SDOH) Interventions     Readmission Risk Interventions No flowsheet data found.
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
## 2021-02-27 NOTE — Progress Notes (Addendum)
PMR Admission Coordinator Pre-Admission Assessment   Patient: Brent Young is an 60 y.o., male MRN: [id number] DOB: [birth year] Height: 6' 1"  (185.4 cm) Weight: 124.7 kg   Insurance Information HMO:     PPO:      PCP:      IPA:      80/20:      OTHER:  PRIMARY: uninsured      Policy#:       Subscriber:  CM Name:       Phone#:      Fax#:  Pre-Cert#:       Employer:  Benefits:  Phone #:      Name:  Eff. Date:      Deduct:       Out of Pocket Max:       Life Max:  CIR:       SNF:  Outpatient:      Co-Pay:  Home Health:       Co-Pay:  DME:      Co-Pay:  Providers:  SECONDARY:       Policy#:      Phone#:    Development worker, community:       Phone#:   The Engineer, petroleum" for patients in Inpatient Rehabilitation Facilities with attached "Privacy Act [place] Records" was provided and verbally reviewed with: N/A   Emergency Contact Information Contact Information       Name Relation Home Work Mobile    [place] Mother [phone]        Taj, Nevins     [phone]    Yaseen, Gilberg     [phone]           Current Medical History  Patient Admitting Diagnosis: right basal ganglia CVA   History of Present Illness: Brent Young is a 60 year old right-handed male with history of diabetes mellitus, hypertension, hyperlipidemia, history of lacunar infarcts, history of hippocampal Colp 2021, tobacco use.  Presented 02/16/2021 with acute onset of left-sided weakness and he did roll out of his bed without loss of consciousness.  Admission chemistries unremarkable potassium 3.1, glucose 181, troponin 37-55, [id number].  Cranial CT scan negative.  CT angiogram head and neck poor opacification of right vertebral artery throughout its course with increased focal stenosis just distal to its takeoff.  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  Patient did not receive tPA.  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  Cerebral angiogram completed showed left ICA 65 to 70% stenosis, bilateral ICA siphon mild to moderate stenosis right VA stenosis.  Placed on aspirin 325 mg daily and Plavix 75 mg daily for 3 months then aspirin alone due to intracranial stenosis.  Echocardiogram with ejection fraction of 60 to 65% no wall motion abnormalities.  Elevated troponin felt to be secondary to demand ischemia.  Lovenox was added for DVT prophylaxis.  Presently on dysphagia #2 thin liquid diet.  Patient with persistent right shoulder pain x-rays without fracture or dislocation consistent with mild AC joint arthritis with supportive care.  Therapy evaluations completed due to patient's left-sided weakness decreased functional mobility was recommended for a comprehensive rehab program.   Complete NIHSS TOTAL: 8   [facility]'s medical record from [facility] has been reviewed by the rehabilitation admission coordinator  and physician.   Past Medical History      Past Medical History:  Diagnosis Date   Allergy     Arthritis     Diabetes mellitus without complication ([place])     Hypertension     Stroke [facility])        Has the patient had major surgery during 100 days prior to admission? No   Family History   family history includes Colon cancer in his maternal grandmother.   Current Medications   Current Facility-Administered Medications:     stroke: mapping our early stages of recovery book, , Does not apply, Once, Chotiner, Yevonne Aline, MD   acetaminophen (TYLENOL) tablet 650 mg, 650 mg, Oral, Q4H PRN, 650 mg at 02/20/21 2138 **OR** acetaminophen (TYLENOL) 160 MG/5ML solution 650 mg, 650 mg, Per Tube, Q4H PRN **OR** acetaminophen (TYLENOL) suppository 650 mg, 650 mg, Rectal, Q4H PRN, Chotiner, Yevonne Aline, MD   acetaminophen (TYLENOL)  tablet 1,000 mg, 1,000 mg, Oral, Once, Ford, Kelsey N, PA-C   amLODipine (NORVASC) tablet 10 mg, 10 mg, Oral, Daily, Chotiner, Yevonne Aline, MD, 10 mg at 02/27/21 1026   aspirin EC tablet 325 mg, 325 mg, Oral, Daily, Vance Gather B, MD, 325 mg at 02/27/21 1025   atorvastatin (LIPITOR) tablet 40 mg, 40 mg, Oral, Daily, Chotiner, Yevonne Aline, MD, 40 mg at 02/27/21 1025   clopidogrel (PLAVIX) tablet 75 mg, 75 mg, Oral, Daily, Vance Gather B, MD, 75 mg at 02/27/21 1024   enoxaparin (LOVENOX) injection 40 mg, 40 mg, Subcutaneous, Q24H, Vance Gather B, MD, 40 mg at 02/26/21 1701   gabapentin (NEURONTIN) capsule 300 mg, 300 mg, Oral, TID, Chotiner, Yevonne Aline, MD, 300 mg at 02/27/21 1025   hydrALAZINE (APRESOLINE) tablet 25 mg, 25 mg, Oral, Q6H PRN, Kristopher Oppenheim, DO, 25 mg at 02/22/21 0650   insulin aspart (novoLOG) injection 0-9 Units, 0-9 Units, Subcutaneous, TID WC, Patrecia Pour, MD, 2 Units at 02/26/21 1229   insulin starter kit- pen needles (English) 1 kit, 1 kit, Other, Once, Gherghe, Costin M, MD   irbesartan (AVAPRO) tablet 75 mg, 75 mg, Oral, Daily, Chotiner, Yevonne Aline, MD, 75 mg at 02/27/21 1024   linagliptin (TRADJENTA) tablet 5 mg, 5 mg, Oral, Daily, Chotiner, Yevonne Aline, MD, 5 mg at 02/27/21 1026   living well with diabetes book MISC, , Does not apply, Once, Vance Gather B, MD   MEDLINE mouth rinse, 15 mL, Mouth Rinse, BID, Gherghe, Costin M, MD, 15 mL at 02/27/21 1026   metFORMIN (GLUCOPHAGE) tablet 1,000 mg, 1,000 mg, Oral, BID WC, Bonnielee Haff, MD, 1,000 mg at 02/27/21 1032   metoprolol succinate (TOPROL-XL) 24 hr tablet 25 mg, 25 mg, Oral, Daily, Gherghe, Costin M, MD, 25 mg at 02/27/21 1025   Oxcarbazepine (TRILEPTAL) tablet 300 mg, 300 mg, Oral, TID, Chotiner, Yevonne Aline, MD, 300 mg at 02/27/21 1025   oxyCODONE (Oxy IR/ROXICODONE) immediate release tablet 5 mg, 5 mg, Oral, Q6H PRN, Bonnielee Haff, MD, 5 mg at 02/27/21 0556   potassium chloride SA (KLOR-CON M) CR tablet 40 mEq, 40 mEq, Oral,  Once, Bonnielee Haff, MD   Patients Current Diet:  Diet Order                  DIET DYS 2 Room service appropriate? Yes; Fluid consistency: Thin  Diet effective now                         Precautions / Restrictions  Precautions Precautions: Fall Precaution Comments: BP goal 130-160 per neuro MD; Dense L side deficits Other Brace: AFO for LLE Restrictions Weight Bearing Restrictions: No    Has the patient had 2 or more falls or a fall with injury in the past year? No   Prior Activity Level Limited Community (1-2x/wk): mod I prior to admit, no DME used, son assists with IADLs   Prior Functional Level Self Care: Did the patient need help bathing, dressing, using the toilet or eating? Independent   Indoor Mobility: Did the patient need assistance with walking from room to room (with or without device)? Independent   Stairs: Did the patient need assistance with internal or external stairs (with or without device)? Independent   Functional Cognition: Did the patient need help planning regular tasks such as shopping or remembering to take medications? Needed some help   Patient Information Are you of Hispanic, Latino/a,or Spanish origin?: A. No, not of Hispanic, Latino/a, or Spanish origin What is your race?: B. Black or African American Do you need or want an interpreter to communicate with a doctor or health care staff?: 0. No   Patient's Response To:  Health Literacy and Transportation Is the patient able to respond to health literacy and transportation needs?: Yes Health Literacy - How often do you need to have someone help you when you read instructions, pamphlets, or other written material from your doctor or pharmacy?: Never In the past 12 months, has lack of transportation kept you from medical appointments or from getting medications?: No In the past 12 months, has lack of transportation kept you from meetings, work, or from getting things needed for daily living?:  No   Home Assistive Devices / Equipment Home Equipment: Conservation officer, nature (2 wheels), [employer] - single point, Contractor Use: Indicate devices/aids used by the patient prior to current illness, exacerbation or injury?  Single point cane   Current Functional Level Cognition   Overall Cognitive Status: Within Functional Limits for tasks assessed Orientation Level: Oriented X4 General Comments: Very motivated for therapy    Extremity Assessment (includes Sensation/Coordination)   Upper Extremity Assessment: LUE deficits/detail LUE Deficits / Details: improved movement against gravity noted, still overall 3-/4 LUE Sensation: decreased light touch, decreased proprioception LUE Coordination: decreased fine motor, decreased gross motor  Lower Extremity Assessment: Defer to PT evaluation LLE Deficits / Details: Trace anterior tibialis, gastroc, and quad activation with movement. LLE Sensation: decreased light touch, decreased proprioception LLE Coordination: decreased fine motor, decreased gross motor     ADLs   Overall ADL's : Needs assistance/impaired Eating/Feeding: Set up, Sitting Grooming: Moderate assistance, Sitting Upper Body Bathing: Moderate assistance, Sitting Lower Body Bathing: Maximal assistance, +2 for physical assistance, +2 for safety/equipment, Sit to/from stand Upper Body Dressing : Moderate assistance, Sitting Lower Body Dressing: Maximal assistance, +2 for physical assistance, +2 for safety/equipment, Sit to/from stand Toilet Transfer: Maximal assistance, +2 for physical assistance, +2 for safety/equipment Toilet Transfer Details (indicate cue type and reason): simulated with use of steady this session Toileting- Clothing Manipulation and Hygiene: Maximal assistance, Bed level Toileting - Clothing Manipulation Details (indicate cue type and reason): max A at bed level; NT assisting pt upon arrival. overall max A for hygiene Functional mobility during ADLs:  Maximal assistance, +2 for physical assistance, +2 for safety/equipment General ADL Comments: session focused on ROM of LUE, sitting balance, standing with steady frame and chair transfer     Mobility   Overal bed mobility: Needs Assistance  Bed Mobility: Rolling, Sidelying to Sit, Sit to Sidelying Rolling: Mod assist, +2 for physical assistance Sidelying to sit: Max assist, +2 for physical assistance Supine to sit: Max assist, +2 for physical assistance, HOB elevated Sit to sidelying: Max assist, +2 for physical assistance General bed mobility comments: Pt modA to roll with improved initiation today, easier to roll to L than R, pt able to help initiate but needs assistance following through, assist with trunk and BLE management when going sidelying<>sit     Transfers   Overall transfer level: Needs assistance Equipment used: Ambulation equipment used Transfers: Sit to/from Stand, Bed to chair/wheelchair/BSC Sit to Stand: Max assist, +2 physical assistance, From elevated surface Bed to/from chair/wheelchair/BSC transfer type:: Via Lift equipment Transfer via Lift Equipment: Maximove, [place] transfer comment: Tamala Ser for STS, pt able to stand ~3 minutes, utilized maximove to transfer patient bed>chair as pt was fatigued after initial standing trial; cues for safe hand placement, heavy posterior and L lean requiring maxA+2 to correct     Ambulation / Gait / Stairs / Proofreader / Balance Dynamic Sitting Balance Sitting balance - Comments: Heavy lean to L that can be corrected short term with verbal cues Balance Overall balance assessment: Needs assistance Sitting-balance support: Feet supported, Single extremity supported Sitting balance-Leahy Scale: Poor Sitting balance - Comments: Heavy lean to L that can be corrected short term with verbal cues Postural control: Posterior lean, Left lateral lean Standing balance support: Bilateral upper  extremity supported Standing balance-Leahy Scale: Zero Standing balance comment: maxA+2 in Sara+ with heavy L and posterior lean     Special needs/care consideration Oxygen 2L in hospital, not at baseline and Diabetic management yes    Previous Home Environment (from acute therapy documentation) Living Arrangements: Spouse/significant other, Children, Other relatives Available Help at Discharge: Family, Available 24 hours/day Type of Home: House Home Layout: Two level, Bed/bath upstairs Alternate Level Stairs-Number of Steps: flight Home Access: Stairs to enter Technical brewer of Steps: 2-3 Bathroom Shower/Tub: Chiropodist: Standard   Discharge Living Setting Plans for Discharge Living Setting: Patient's home, Lives with (comment) (mom, sister, and son) Type of Home at Discharge: House Discharge Home Layout: Two level, Bed/bath upstairs, Able to live on main level with bedroom/bathroom (mom's bedroom/bathroom are downstairs) Alternate Level Stairs-Rails: Right Alternate Level Stairs-Number of Steps: full flight Discharge Home Access: Stairs to enter Entrance Stairs-Rails: Right Entrance Stairs-Number of Steps: 5 (through front door) Discharge Bathroom Shower/Tub: Tub/shower unit Discharge Bathroom Toilet: Standard Discharge Bathroom Accessibility: Yes How Accessible: Accessible via walker Does the patient have any problems obtaining your medications?: Yes (Describe) (uninsured)   Social/Family/Support Systems Anticipated Caregiver: multiple family members, primary contact is son [name] Anticipated Ambulance person Information: [phone] Ability/Limitations of Caregiver: family can provide up to mod assist with mobility/ADLs at home Caregiver Availability: 24/7 Discharge Plan Discussed with Primary Caregiver: Yes Is Caregiver In Agreement with Plan?: Yes Does Caregiver/Family have Issues with Lodging/Transportation while Pt is in Rehab?: No    Goals Patient/Family Goal for Rehab: PT/OT min to mod assist w/c level, SLP mod I Expected length of stay: 18-21 days Pt/Family Agrees to Admission and willing to participate: Yes Program Orientation Provided & Reviewed with Pt/Caregiver Including Roles  & Responsibilities: Yes  Barriers to Discharge: Insurance for SNF coverage, Home environment access/layout   Decrease burden of Care through IP rehab admission: n/a   Possible need for SNF placement upon discharge: no.  Good family support at home and have reviewed that without payor source will be unable to place in SNF following CIR.    Patient Condition: I have reviewed medical records from [facility], spoken with  [facility] team , and patient and son. I met with patient at the bedside and discussed via phone for inpatient rehabilitation assessment.  Patient will benefit from ongoing PT, OT, and SLP, can actively participate in 3 hours of therapy a day 5 days of the week, and can make measurable gains during the admission.  Patient will also benefit from the coordinated team approach during an Inpatient Acute Rehabilitation admission.  The patient will receive intensive therapy as well as Rehabilitation physician, nursing, social worker, and care management interventions.  Due to bladder management, bowel management, safety, skin/wound care, disease management, medication administration, pain management, and patient education the patient requires 24 hour a day rehabilitation nursing.  The patient is currently max +2 with mobility and basic ADLs.  Discharge setting and therapy post discharge at  home  is anticipated.  Patient has agreed to participate in the Acute Inpatient Rehabilitation Program and will admit today.   Preadmission Screen Completed By:  Michel Santee, PT, DPT 02/27/2021 11:08 AM ______________________________________________________________________   Discussed status with Dr. Dagoberto Ligas on 02/27/21  at 11:14 AM  and received approval for  admission today.   Admission Coordinator:  Michel Santee, PT, DPT time 11:14 AM Sudie Grumbling 02/27/21     Assessment/Plan: Diagnosis: Does the need for close, 24 hr/day Medical supervision in concert with the patient's rehab needs make it unreasonable for this patient to be served in a less intensive setting? Yes Co-Morbidities requiring supervision/potential complications: Dense L hemiplegia, Dysphagia on D2 diet; R Basal gnaglia/corna radiata stroke; BMI 36; DM A1c 9.9; smoker; HTN Due to bladder management, bowel management, safety, skin/wound care, disease management, medication administration, and patient education, does the patient require 24 hr/day rehab nursing? Yes Does the patient require coordinated care of a physician, rehab nurse, PT, OT, and SLP to address physical and functional deficits in the context of the above medical diagnosis(es)? Yes Addressing deficits in the following areas: balance, endurance, locomotion, strength, transferring, bowel/bladder control, bathing, dressing, feeding, grooming, toileting, cognition, speech, language, and swallowing Can the patient actively participate in an intensive therapy program of at least 3 hrs of therapy 5 days a week? Yes The potential for patient to make measurable gains while on inpatient rehab is good and fair Anticipated functional outcomes upon discharge from inpatient rehab: min assist and mod assist PT, min assist and mod assist OT, supervision and min assist SLP Estimated rehab length of stay to reach the above functional goals is: 18-21 days Anticipated discharge destination: Home 10. Overall Rehab/Functional Prognosis: good and fair     MD Signature:

## 2021-02-27 NOTE — PMR Pre-admission (Signed)
PMR Admission Coordinator Pre-Admission Assessment  Patient: Brent Young is an 60 y.o., male MRN: [id number] DOB: Sep 02, 1960 Height: 6' 1"  (185.4 cm) Weight: 124.7 kg  Insurance Information HMO:     PPO:      PCP:      IPA:      80/20:      OTHER:  PRIMARY: uninsured      Policy#:       Subscriber:  CM Name:       Phone#:      Fax#:  Pre-Cert#:       Employer:  Benefits:  Phone #:      Name:  Eff. Date:      Deduct:       Out of Pocket Max:       Life Max:  CIR:       SNF:  Outpatient:      Co-Pay:  Home Health:       Co-Pay:  DME:      Co-Pay:  Providers:  SECONDARY:       Policy#:      Phone#:   Development worker, community:       Phone#:  The Engineer, petroleum" for patients in Inpatient Rehabilitation Facilities with attached "Privacy Act [place] Records" was provided and verbally reviewed with: N/A  Emergency Contact Information Contact Information     Name Relation Home Work Mobile   Brent Young [phone]     Brent Young   [phone]   Brent Young   [phone]       Current Medical History  Patient Admitting Diagnosis: right basal ganglia CVA  History of Present Illness: Brent Young is a 60 year old right-handed male with history of diabetes mellitus, hypertension, hyperlipidemia, history of lacunar infarcts, history of hippocampal [place] 2021, tobacco use.  Presented 02/16/2021 with acute onset of left-sided weakness and he did roll out of his bed without loss of consciousness.  Admission chemistries unremarkable potassium 3.1, glucose 181, troponin 37-55, [id number].  Cranial CT scan negative.  CT angiogram head and neck poor opacification of right vertebral artery throughout its course with increased focal stenosis just distal to its takeoff.  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  Patient did not receive tPA.  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  Cerebral angiogram completed showed left ICA 65 to 70% stenosis, bilateral ICA siphon mild to moderate stenosis right VA stenosis.  Placed on aspirin 325 mg daily and Plavix 75 mg daily for 3 months then aspirin alone due to intracranial stenosis.  Echocardiogram with ejection fraction of 60 to 65% no wall motion abnormalities.  Elevated troponin felt to be secondary to demand ischemia.  Lovenox was added for DVT prophylaxis.  Presently on dysphagia #2 thin liquid diet.  Patient with persistent right shoulder pain x-rays without fracture or dislocation consistent with mild AC joint arthritis with supportive care.  Therapy evaluations completed due to patient's left-sided weakness decreased functional mobility was recommended for a comprehensive rehab program.  Complete NIHSS TOTAL: 8  [facility]'s medical record from [facility] has been reviewed by the rehabilitation admission coordinator and physician.  Past Medical History  Past Medical History:  Diagnosis Date   Allergy    Arthritis    Diabetes  mellitus without complication ([place])    Hypertension    Stroke [employer])     Has the patient had major surgery during 100 days prior to admission? No  Family History   family history includes Colon cancer in his maternal grandmother.  Current Medications  Current Facility-Administered Medications:     stroke: mapping our early stages of recovery book, , Does not apply, Once, Chotiner, Yevonne Aline, MD   acetaminophen (TYLENOL) tablet 650 mg, 650 mg, Oral, Q4H PRN, 650 mg at 02/20/21 2138 **OR** acetaminophen (TYLENOL) 160 MG/5ML solution 650 mg, 650 mg, Per Tube, Q4H PRN **OR** acetaminophen (TYLENOL) suppository 650 mg, 650 mg, Rectal, Q4H PRN, Chotiner, Yevonne Aline, MD   acetaminophen (TYLENOL) tablet 1,000 mg, 1,000 mg, Oral, Once, Ford,  Kelsey N, PA-C   amLODipine (NORVASC) tablet 10 mg, 10 mg, Oral, Daily, Chotiner, Yevonne Aline, MD, 10 mg at 02/27/21 1026   aspirin EC tablet 325 mg, 325 mg, Oral, Daily, Vance Gather B, MD, 325 mg at 02/27/21 1025   atorvastatin (LIPITOR) tablet 40 mg, 40 mg, Oral, Daily, Chotiner, Yevonne Aline, MD, 40 mg at 02/27/21 1025   clopidogrel (PLAVIX) tablet 75 mg, 75 mg, Oral, Daily, Vance Gather B, MD, 75 mg at 02/27/21 1024   enoxaparin (LOVENOX) injection 40 mg, 40 mg, Subcutaneous, Q24H, Vance Gather B, MD, 40 mg at 02/26/21 1701   gabapentin (NEURONTIN) capsule 300 mg, 300 mg, Oral, TID, Chotiner, Yevonne Aline, MD, 300 mg at 02/27/21 1025   hydrALAZINE (APRESOLINE) tablet 25 mg, 25 mg, Oral, Q6H PRN, Kristopher Oppenheim, DO, 25 mg at 02/22/21 0650   insulin aspart (novoLOG) injection 0-9 Units, 0-9 Units, Subcutaneous, TID WC, Patrecia Pour, MD, 2 Units at 02/26/21 1229   insulin starter kit- pen needles (English) 1 kit, 1 kit, Other, Once, Gherghe, Costin M, MD   irbesartan (AVAPRO) tablet 75 mg, 75 mg, Oral, Daily, Chotiner, Yevonne Aline, MD, 75 mg at 02/27/21 1024   linagliptin (TRADJENTA) tablet 5 mg, 5 mg, Oral, Daily, Chotiner, Yevonne Aline, MD, 5 mg at 02/27/21 1026   living well with diabetes book MISC, , Does not apply, Once, Vance Gather B, MD   MEDLINE mouth rinse, 15 mL, Mouth Rinse, BID, Gherghe, Costin M, MD, 15 mL at 02/27/21 1026   metFORMIN (GLUCOPHAGE) tablet 1,000 mg, 1,000 mg, Oral, BID WC, Bonnielee Haff, MD, 1,000 mg at 02/27/21 1032   metoprolol succinate (TOPROL-XL) 24 hr tablet 25 mg, 25 mg, Oral, Daily, Gherghe, Costin M, MD, 25 mg at 02/27/21 1025   Oxcarbazepine (TRILEPTAL) tablet 300 mg, 300 mg, Oral, TID, Chotiner, Yevonne Aline, MD, 300 mg at 02/27/21 1025   oxyCODONE (Oxy IR/ROXICODONE) immediate release tablet 5 mg, 5 mg, Oral, Q6H PRN, Bonnielee Haff, MD, 5 mg at 02/27/21 0556   potassium chloride SA (KLOR-CON M) CR tablet 40 mEq, 40 mEq, Oral, Once, Bonnielee Haff, MD  Patients Current  Diet:  Diet Order             DIET DYS 2 Room service appropriate? Yes; Fluid consistency: Thin  Diet effective now                   Precautions / Restrictions Precautions Precautions: Fall Precaution Comments: BP goal 130-160 per neuro MD; Dense L side deficits Other Brace: AFO for LLE Restrictions Weight Bearing Restrictions: No   Has the patient had 2 or more falls or a fall with injury in the past year? No  Prior Activity Level Limited Community (  1-2x/wk): mod I prior to admit, no DME used, son assists with IADLs  Prior Functional Level Self Care: Did the patient need help bathing, dressing, using the toilet or eating? Independent  Indoor Mobility: Did the patient need assistance with walking from room to room (with or without device)? Independent  Stairs: Did the patient need assistance with internal or external stairs (with or without device)? Independent  Functional Cognition: Did the patient need help planning regular tasks such as shopping or remembering to take medications? Needed some help  Patient Information Are you of Hispanic, Latino/a,or Spanish origin?: A. No, not of Hispanic, Latino/a, or Spanish origin What is your race?: B. Black or African American Do you need or want an interpreter to communicate with a doctor or health care staff?: 0. No  Patient's Response To:  Health Literacy and Transportation Is the patient able to respond to health literacy and transportation needs?: Yes Health Literacy - How often do you need to have someone help you when you read instructions, pamphlets, or other written material from your doctor or pharmacy?: Never In the past 12 months, has lack of transportation kept you from medical appointments or from getting medications?: No In the past 12 months, has lack of transportation kept you from meetings, work, or from getting things needed for daily living?: No  Home Assistive Devices / Equipment Home Equipment: Chartered certified accountant (2 wheels), [employer] - single point, Control and instrumentation engineer Use: Indicate devices/aids used by the patient prior to current illness, exacerbation or injury?  Single point cane  Current Functional Level Cognition  Overall Cognitive Status: Within Functional Limits for tasks assessed Orientation Level: Oriented X4 General Comments: Very motivated for therapy    Extremity Assessment (includes Sensation/Coordination)  Upper Extremity Assessment: LUE deficits/detail LUE Deficits / Details: improved movement against gravity noted, still overall 3-/4 LUE Sensation: decreased light touch, decreased proprioception LUE Coordination: decreased fine motor, decreased gross motor  Lower Extremity Assessment: Defer to PT evaluation LLE Deficits / Details: Trace anterior tibialis, gastroc, and quad activation with movement. LLE Sensation: decreased light touch, decreased proprioception LLE Coordination: decreased fine motor, decreased gross motor    ADLs  Overall ADL's : Needs assistance/impaired Eating/Feeding: Set up, Sitting Grooming: Moderate assistance, Sitting Upper Body Bathing: Moderate assistance, Sitting Lower Body Bathing: Maximal assistance, +2 for physical assistance, +2 for safety/equipment, Sit to/from stand Upper Body Dressing : Moderate assistance, Sitting Lower Body Dressing: Maximal assistance, +2 for physical assistance, +2 for safety/equipment, Sit to/from stand Toilet Transfer: Maximal assistance, +2 for physical assistance, +2 for safety/equipment Toilet Transfer Details (indicate cue type and reason): simulated with use of steady this session Toileting- Clothing Manipulation and Hygiene: Maximal assistance, Bed level Toileting - Clothing Manipulation Details (indicate cue type and reason): max A at bed level; NT assisting pt upon arrival. overall max A for hygiene Functional mobility during ADLs: Maximal assistance, +2 for physical assistance, +2 for  safety/equipment General ADL Comments: session focused on ROM of LUE, sitting balance, standing with steady frame and chair transfer    Mobility  Overal bed mobility: Needs Assistance Bed Mobility: Rolling, Sidelying to Sit, Sit to Sidelying Rolling: Mod assist, +2 for physical assistance Sidelying to sit: Max assist, +2 for physical assistance Supine to sit: Max assist, +2 for physical assistance, HOB elevated Sit to sidelying: Max assist, +2 for physical assistance General bed mobility comments: Pt modA to roll with improved initiation today, easier to roll to L than R, pt able to help  initiate but needs assistance following through, assist with trunk and BLE management when going sidelying<>sit    Transfers  Overall transfer level: Needs assistance Equipment used: Ambulation equipment used Transfers: Sit to/from Stand, Bed to chair/wheelchair/BSC Sit to Stand: Max assist, +2 physical assistance, From elevated surface Bed to/from chair/wheelchair/BSC transfer type:: Via Lift equipment Transfer via Lift Equipment: Maximove, [place] transfer comment: Tamala Ser for STS, pt able to stand ~3 minutes, utilized maximove to transfer patient bed>chair as pt was fatigued after initial standing trial; cues for safe hand placement, heavy posterior and L lean requiring maxA+2 to correct    Ambulation / Gait / Stairs / Office manager / Balance Dynamic Sitting Balance Sitting balance - Comments: Heavy lean to L that can be corrected short term with verbal cues Balance Overall balance assessment: Needs assistance Sitting-balance support: Feet supported, Single extremity supported Sitting balance-Leahy Scale: Poor Sitting balance - Comments: Heavy lean to L that can be corrected short term with verbal cues Postural control: Posterior lean, Left lateral lean Standing balance support: Bilateral upper extremity supported Standing balance-Leahy Scale: Zero Standing  balance comment: maxA+2 in [place]+ with heavy L and posterior lean    Special needs/care consideration Oxygen 2L in hospital, not at baseline and Diabetic management yes   Previous Home Environment (from acute therapy documentation) Living Arrangements: Spouse/significant other, Children, Other relatives Available Help at Discharge: Family, Available 24 hours/day Type of Home: House Home Layout: Two level, Bed/bath upstairs Alternate Level Stairs-Number of Steps: flight Home Access: Stairs to enter Technical brewer of Steps: 2-3 Bathroom Shower/Tub: Chiropodist: Standard  Discharge Living Setting Plans for Discharge Living Setting: Patient's home, Lives with (comment) (mom, sister, and son) Type of Home at Discharge: House Discharge Home Layout: Two level, Bed/bath upstairs, Able to live on main level with bedroom/bathroom (mom's bedroom/bathroom are downstairs) Alternate Level Stairs-Rails: Right Alternate Level Stairs-Number of Steps: full flight Discharge Home Access: Stairs to enter Entrance Stairs-Rails: Right Entrance Stairs-Number of Steps: 5 (through front door) Discharge Bathroom Shower/Tub: Tub/shower unit Discharge Bathroom Toilet: Standard Discharge Bathroom Accessibility: Yes How Accessible: Accessible via walker Does the patient have any problems obtaining your medications?: Yes (Describe) (uninsured)  Social/Family/Support Systems Anticipated Caregiver: multiple family members, primary contact is son [name] Anticipated Ambulance person Information: [phone] Ability/Limitations of Caregiver: family can provide up to mod assist with mobility/ADLs at home Caregiver Availability: 24/7 Discharge Plan Discussed with Primary Caregiver: Yes Is Caregiver In Agreement with Plan?: Yes Does Caregiver/Family have Issues with Lodging/Transportation while Pt is in Rehab?: No  Goals Patient/Family Goal for Rehab: PT/OT min to mod assist w/c level,  SLP mod I Expected length of stay: 18-21 days Pt/Family Agrees to Admission and willing to participate: Yes Program Orientation Provided & Reviewed with Pt/Caregiver Including Roles  & Responsibilities: Yes  Barriers to Discharge: Insurance for SNF coverage, Home environment access/layout  Decrease burden of Care through IP rehab admission: n/a  Possible need for SNF placement upon discharge: no.  Good family support at home and have reviewed that without payor source will be unable to place in SNF following CIR.   Patient Condition: I have reviewed medical records from [facility], spoken with  [facility] team , and patient and son. I met with patient at the bedside and discussed via phone for inpatient rehabilitation assessment.  Patient will benefit from ongoing PT, OT, and SLP, can actively participate in 3 hours of therapy a day 5  days of the week, and can make measurable gains during the admission.  Patient will also benefit from the coordinated team approach during an Inpatient Acute Rehabilitation admission.  The patient will receive intensive therapy as well as Rehabilitation physician, nursing, social worker, and care management interventions.  Due to bladder management, bowel management, safety, skin/wound care, disease management, medication administration, pain management, and patient education the patient requires 24 hour a day rehabilitation nursing.  The patient is currently max +2 with mobility and basic ADLs.  Discharge setting and therapy post discharge at  home  is anticipated.  Patient has agreed to participate in the Acute Inpatient Rehabilitation Program and will admit today.  Preadmission Screen Completed By:  Michel Santee, PT, DPT 02/27/2021 11:08 AM ______________________________________________________________________   Discussed status with Dr. Dagoberto Ligas on 02/27/21  at 11:14 AM  and received approval for admission today.  Admission Coordinator:  Michel Santee, PT, DPT time  11:14 AM Sudie Grumbling 02/27/21    Assessment/Plan: Diagnosis: Does the need for close, 24 hr/day Medical supervision in concert with the patient's rehab needs make it unreasonable for this patient to be served in a less intensive setting? Yes Co-Morbidities requiring supervision/potential complications: Dense L hemiplegia, Dysphagia on D2 diet; R Basal gnaglia/corna radiata stroke; BMI 36; DM A1c 9.9; smoker; HTN Due to bladder management, bowel management, safety, skin/wound care, disease management, medication administration, and patient education, does the patient require 24 hr/day rehab nursing? Yes Does the patient require coordinated care of a physician, rehab nurse, PT, OT, and SLP to address physical and functional deficits in the context of the above medical diagnosis(es)? Yes Addressing deficits in the following areas: balance, endurance, locomotion, strength, transferring, bowel/bladder control, bathing, dressing, feeding, grooming, toileting, cognition, speech, language, and swallowing Can the patient actively participate in an intensive therapy program of at least 3 hrs of therapy 5 days a week? Yes The potential for patient to make measurable gains while on inpatient rehab is good and fair Anticipated functional outcomes upon discharge from inpatient rehab: min assist and mod assist PT, min assist and mod assist OT, supervision and min assist SLP Estimated rehab length of stay to reach the above functional goals is: 18-21 days Anticipated discharge destination: Home 10. Overall Rehab/Functional Prognosis: good and fair   MD Signature:

## 2021-02-27 NOTE — Progress Notes (Signed)
Inpatient Rehabilitation Admission Medication Review by a Pharmacist   A complete drug regimen review was completed for this patient to identify any potential clinically significant medication issues.   High Risk Drug Classes Is patient taking? Indication by Medication  Antipsychotic No    Anticoagulant Yes Lovenox- VTE prophylaxis  Antibiotic No    Opioid Yes OxyIR- pain  Antiplatelet Yes Aspirin 325 mg/Plavix 75 mg- CVA prophylaxis  Hypoglycemics/insulin Yes  linagliptin, metformin, iSS- T2DM  Vasoactive Medication Yes  Avapro, Toprol XL, Norvasc- Hypertension  Chemotherapy No    Other Yes  Lipitor- HLD Gabapentin- neuropathic pain Trileptal- seizure         Type of Medication Issue Identified Description of Issue Recommendation(s)  Drug Interaction(s) (clinically significant)        Duplicate Therapy        Allergy        No Medication Administration End Date    DAPT (ASA/Plavix)  Neurology progress note states DAPT X3 months (Start 02/18/2021 End 05/12/2021) followed by baby aspirin monotherapy (starting 05/13/2021)  Incorrect Dose        Additional Drug Therapy Needed   Lasix po Discharging hospitalist states in his discharge summary (02/27/2021) to start lasix PO for pulmonary edema if needed  Significant med changes from prior encounter (inform family/care partners about these prior to discharge).      Other   PTA meds; HCTZ, amaryl, ergocalciferol  restart PTA meds when clinically appropriate      Clinically significant medication issues were identified that warrant physician communication and completion of prescribed/recommended actions by midnight of the next day:  No     Time spent performing this drug regimen review (minutes):  30     Creasie Lacosse J Crystallynn Noorani BS, PharmD 02/27/2021 3:04 PM

## 2021-02-28 DIAGNOSIS — E1169 Type 2 diabetes mellitus with other specified complication: Secondary | ICD-10-CM

## 2021-02-28 DIAGNOSIS — E669 Obesity, unspecified: Secondary | ICD-10-CM

## 2021-02-28 DIAGNOSIS — I10 Essential (primary) hypertension: Secondary | ICD-10-CM

## 2021-02-28 DIAGNOSIS — J810 Acute pulmonary edema: Secondary | ICD-10-CM

## 2021-02-28 DIAGNOSIS — K5901 Slow transit constipation: Secondary | ICD-10-CM

## 2021-02-28 LAB — COMPREHENSIVE METABOLIC PANEL
ALT: 34 U/L (ref 0–44)
AST: 27 U/L (ref 15–41)
Albumin: 3.4 g/dL — ABNORMAL LOW (ref 3.5–5.0)
Alkaline Phosphatase: 120 U/L (ref 38–126)
Anion gap: 10 (ref 5–15)
BUN: 14 mg/dL (ref 8–23)
CO2: 33 mmol/L — ABNORMAL HIGH (ref 22–32)
Calcium: 9 mg/dL (ref 8.9–10.3)
Chloride: 95 mmol/L — ABNORMAL LOW (ref 98–111)
Creatinine, Ser: 0.79 mg/dL (ref 0.61–1.24)
GFR, Estimated: >60 mL/min (ref >60)
Glucose, Bld: 152 mg/dL — ABNORMAL HIGH (ref 70–99)
Potassium: 3.9 mmol/L (ref 3.5–5.1)
Sodium: 138 mmol/L (ref 135–145)
Total Bilirubin: 0.8 mg/dL (ref 0.3–1.2)
Total Protein: 7.1 g/dL (ref 6.5–8.1)

## 2021-02-28 LAB — CBC WITH DIFFERENTIAL/PLATELET
Abs Immature Granulocytes: 0.02 10*3/uL (ref 0.00–0.07)
Basophils Absolute: 0.1 10*3/uL (ref 0.0–0.1)
Basophils Relative: 1 %
Eosinophils Absolute: 0.2 10*3/uL (ref 0.0–0.5)
Eosinophils Relative: 2 %
HCT: 39.3 % (ref 39.0–52.0)
Hemoglobin: 12.9 g/dL — ABNORMAL LOW (ref 13.0–17.0)
Immature Granulocytes: 0 %
Lymphocytes Relative: 31 %
Lymphs Abs: 2.7 10*3/uL (ref 0.7–4.0)
MCH: 30.6 pg (ref 26.0–34.0)
MCHC: 32.8 g/dL (ref 30.0–36.0)
MCV: 93.3 fL (ref 80.0–100.0)
Monocytes Absolute: 1.2 10*3/uL — ABNORMAL HIGH (ref 0.1–1.0)
Monocytes Relative: 13 %
Neutro Abs: 4.7 10*3/uL (ref 1.7–7.7)
Neutrophils Relative %: 53 %
Platelets: 267 10*3/uL (ref 150–400)
RBC: 4.21 MIL/uL — ABNORMAL LOW (ref 4.22–5.81)
RDW: 15.1 % (ref 11.5–15.5)
WBC: 8.9 10*3/uL (ref 4.0–10.5)
nRBC: 0 % (ref 0.0–0.2)

## 2021-02-28 LAB — GLUCOSE, CAPILLARY
Glucose-Capillary: 143 mg/dL — ABNORMAL HIGH (ref 70–99)
Glucose-Capillary: 141 mg/dL — ABNORMAL HIGH (ref 70–99)
Glucose-Capillary: 118 mg/dL — ABNORMAL HIGH (ref 70–99)
Glucose-Capillary: 114 mg/dL — ABNORMAL HIGH (ref 70–99)

## 2021-02-28 MED ORDER — SORBITOL 70 % SOLN
30.0000 mL | Freq: Every day | Status: DC | PRN
  Administered 2021-03-11 – 2021-03-28 (×2): 30 mL via ORAL
  Filled 2021-02-28 (×2): qty 30

## 2021-02-28 MED ORDER — ORAL CARE MOUTH RINSE
15.0000 mL | Freq: Two times a day (BID) | OROMUCOSAL | Status: DC
  Administered 2021-03-01 – 2021-04-03 (×59): 15 mL via OROMUCOSAL

## 2021-02-28 MED ORDER — SENNOSIDES-DOCUSATE SODIUM 8.6-50 MG PO TABS
2.0000 | ORAL_TABLET | Freq: Every day | ORAL | Status: DC
  Administered 2021-03-02 – 2021-04-03 (×33): 2 via ORAL
  Filled 2021-02-28 (×34): qty 2

## 2021-02-28 NOTE — Progress Notes (Signed)
Inpatient Rehabilitation  Patient information reviewed and entered into eRehab system by Keiton Cosma M. Johneric Mcfadden, M.A., CCC/SLP, PPS Coordinator.  Information including medical coding, functional ability and quality indicators will be reviewed and updated through discharge.    

## 2021-02-28 NOTE — Progress Notes (Signed)
Occupational Therapy Session Note  Patient Details  Name: Brent Young MRN: [id number] Date of Birth: 1960/08/07  Today's Date: 03/01/2021 OT Individual Time: 1345-1430 OT Individual Time Calculation (min): 45 min   Short Term Goals: Week 1:  OT Short Term Goal 1 (Week 1): Pt will be able to roll onto his R side with min A to A caregivers. OT Short Term Goal 2 (Week 1): Pt will be able to sit to EOB with mod A of 1. OT Short Term Goal 3 (Week 1): Pt will sit at EOB with min A to wash UB. OT Short Term Goal 4 (Week 1): Pt will be able to wash L arm with min A by actively lifting it partially away from his body. OT Short Term Goal 5 (Week 1): Pt will be able to rise to stand with max A of 2 to prepare for toileting.  Skilled Therapeutic Interventions/Progress Updates:    Pt greeted in the TIS w/c, reporting that he felt very fatigued today. 02 assessed with reading of 77%. His oxygen had been turned off. Placed him back on 2L 02 and pts sats increased to 94% after ~2 minutes. NT was present with OT at time of reading, nurse informed that pts 02 had been turned off. Pt was agreeable to participate, worked on core strengthening via anterior/posterior weight shifting in the w/c with chair tilted up in neutral position. OT facilitated forward weight shift on his Lt side until +2 assist arrived. Pt required Max A to transition forward, worked on neutral midline at this time with OT holding both of his hands, pt tends to lean Lt. While sitting unsupported in the w/c, worked on active assist ROM of the Lt UE, pt able to tolerate resistance when given small pushes, shoulder flexion/extension and IR/ER with active movement. Pt with a little bit of AROM elbow against gravity when bringing hand towards chin but not enough to make this motion functional yet. 02 sats when assessed 97-98% on 2L. He was agreeable to try [facility] for transfer back to bed. Heavy +2 assist to rise into standing using bariatric Stedy.  +2 assistance for returning to bed. He remained in bed with Lt UE elevated. All needs within reach and bed alarm set, satting at 97%. Tx focus placed on core strengthening and NMR in prep for functional transfer training.   Therapy Documentation Precautions:  Precautions Precautions: Fall Precaution Comments: BP goal 130-160 per neuro MD; Dense L side deficits Other Brace: AFO for LLE Restrictions Weight Bearing Restrictions: No  Pain: no s/s pain during tx   ADL: ADL Eating: Set up Grooming: Setup Upper Body Bathing: Moderate assistance Where Assessed-Upper Body Bathing: Bed level Lower Body Bathing: Maximal assistance Where Assessed-Lower Body Bathing: Bed level Upper Body Dressing: Maximal assistance Where Assessed-Upper Body Dressing: Bed level Lower Body Dressing: Dependent Where Assessed-Lower Body Dressing: Bed level Toileting: Unable to assess Toilet Transfer: Unable to assess    Therapy/Group: Individual Therapy  Emmarae Cowdery A Arless Vineyard 03/01/2021, 4:36 PM

## 2021-02-28 NOTE — Evaluation (Signed)
Physical Therapy Assessment and Plan  Patient Details  Name: Brent Young MRN: [id number] Date of Birth: [birth year]  PT Diagnosis: Abnormal posture, Abnormality of gait, Hemiparesis non-dominant, Hypotonia, and Impaired sensation Rehab Potential: Good (good for wc level activity) ELOS: 4 weeks   Today's Date: 02/28/2021 PT Individual Time: 1300-1400 PT Individual Time Calculation (min): 60 min    Hospital Problem: Principal Problem:   Infarction of right basal ganglia ([place])   Past Medical History:  Past Medical History:  Diagnosis Date   Allergy    Arthritis    Diabetes mellitus without complication ([place])    Hypertension    Stroke [facility])    Past Surgical History:  Past Surgical History:  Procedure Laterality Date   FRACTURE SURGERY Left    Patient fractured left arm   IR ANGIO INTRA EXTRACRAN SEL COM CAROTID INNOMINATE UNI L MOD SED  02/19/2021   IR ANGIO INTRA EXTRACRAN SEL INTERNAL CAROTID UNI R MOD SED  02/19/2021   IR ANGIO VERTEBRAL SEL SUBCLAVIAN INNOMINATE UNI R MOD SED  02/19/2021   IR ANGIO VERTEBRAL SEL VERTEBRAL UNI L MOD SED  02/19/2021   IR US GUIDE VASC ACCESS RIGHT  02/19/2021    Assessment & Plan Clinical Impression: Patient isSteven A Young is a 61 year old right-handed male with history of diabetes mellitus, hypertension, hyperlipidemia, history of lacunar infarcts, history of hippocampal [place] 2021, tobacco use.  Per chart review patient lives with spouse.  Two-level home bed and bath upstairs 3 steps to entry.  Presented 02/16/2021 with acute onset of left-sided weakness and he did roll out of his bed without loss of consciousness.  Admission chemistries unremarkable potassium 3.1, glucose 181, troponin 37-55, [id number].  Cranial CT scan negative.  CT angiogram head and neck poor opacification of right vertebral artery throughout its course with increased focal stenosis just distal to its takeoff.  The right V4 was occluded proximal to the takeoff of the right  PICA and also redemonstrated moderate to severe calcified plaque in the carotid siphons right greater than left.  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  Patient did not receive tPA.  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  Cerebral angiogram completed showed left ICA 65 to 70% stenosis, bilateral ICA siphon mild to moderate stenosis right VA stenosis.  Placed on aspirin 325 mg daily and Plavix 75 mg daily for 3 months then aspirin alone due to intracranial stenosis.  Echocardiogram with ejection fraction of 60 to 65% no wall motion abnormalities.  Elevated troponin felt to be secondary to demand ischemia.  Lovenox was added for DVT prophylaxis.  Presently on dysphagia #2 thin liquid diet.  Patient with persistent right shoulder pain x-rays without fracture or dislocation consistent with mild AC joint arthritis with supportive care.  Therapy evaluations completed due to patient's left-sided weakness decreased functional mobility  Patient transferred to CIR on 02/27/2021 .   Patient currently requires total with mobility secondary to muscle weakness, muscle joint tightness, and muscle paralysis, decreased cardiorespiratoy endurance and decreased oxygen support, impaired timing and sequencing, abnormal tone, unbalanced muscle activation, and decreased coordination, and decreased midline orientation and decreased attention to left.  Prior to hospitalization, patient was independent  with mobility and lived with Family, Son, Other (Comment) in a House home.  Home access is 2-3Stairs to enter.  Patient will benefit from skilled PT intervention to maximize safe functional mobility, minimize fall risk, and decrease caregiver burden for planned  discharge home with intermittent assist.  Anticipate patient will benefit from follow up [facility] at discharge.  PT - End of Session Activity Tolerance: Decreased this session;Tolerates 10 - 20 min activity with multiple  rests Endurance Deficit: Yes Endurance Deficit Description: trunk control fades from min assist and cues to max assist w/fatigue PT Assessment Rehab Potential (ACUTE/IP ONLY): Good (good for wc level activity) PT Barriers to Discharge: Inaccessible home environment;Decreased caregiver support;Home environment access/layout;Weight;New oxygen PT Barriers to Discharge Comments: dense hemiplegia, morbid obesity PT Patient demonstrates impairments in the following area(s): Balance;Safety;Sensory;Endurance;Motor;Pain;Perception PT Transfers Functional Problem(s): Bed Mobility;Bed to Chair;Car;Furniture PT Locomotion Functional Problem(s): Ambulation;Wheelchair Mobility;Stairs PT Plan PT Intensity: Minimum of 1-2 x/day ,45 to 90 minutes PT Frequency: 5 out of 7 days PT Duration Estimated Length of Stay: 4 weeks PT Treatment/Interventions: Ambulation/gait training;Community reintegration;DME/adaptive equipment instruction;Neuromuscular re-education;Psychosocial support;Stair training;UE/LE Strength taining/ROM;Wheelchair propulsion/positioning;Balance/vestibular training;Discharge planning;Functional electrical stimulation;Pain management;Therapeutic Activities;UE/LE Coordination activities;Disease management/prevention;Functional mobility training;Patient/family education;Splinting/orthotics;Therapeutic Exercise PT Transfers Anticipated Outcome(s): min assist basic, mod assist car PT Locomotion Anticipated Outcome(s): anticipate wc as primary mode of functinal mobility PT Recommendation Recommendations for Other Services: Therapeutic Recreation consult Follow Up Recommendations: Home health PT;24 hour supervision/assistance Patient destination: Home Equipment Recommended: To be determined Equipment Details: will likely need custom vs PWC   PT Evaluation Precautions/Restrictions Precautions Precautions: Fall Precaution Comments: BP goal 130-160 per neuro MD; Dense L side  deficits Restrictions Weight Bearing Restrictions: No General   Vital Signs Pain Pain Assessment Pain Scale: 0-10 Pain Score: 2  Pain Type: Acute pain Pain Location: Shoulder Pain Orientation: Right Pain Descriptors / Indicators: Aching Pain Interference Pain Interference Pain Effect on Sleep: 2. Occasionally Pain Interference with Therapy Activities: 2. Occasionally Pain Interference with Day-to-Day Activities: 2. Occasionally Home Living/Prior Functioning Home Living Available Help at Discharge: Family;Available 24 hours/day Type of Home: House Home Access: Stairs to enter [employer] of Steps: 2-3 Home Layout: Two level;Bed/bath upstairs Alternate Level Stairs-Number of Steps: flight Bathroom Shower/Tub: Chiropodist: Standard Additional Comments: sister has a room on first floor with walk in shower, home belongs to her/uncertain if switching levels is good option  Lives With: Family;Son;Other (Comment) Prior Function Level of Independence: Independent with basic ADLs;Independent with homemaking with ambulation;Independent with gait;Independent with transfers  Able to Take Stairs?: Yes Driving: Yes Vocation Requirements: pt was working on his Phd at time of first stroke in 2021, has not returned to school or work Vision/Perception  Vision - History Ability to See in Adequate Light: 0 Adequate  Cognition Overall Cognitive Status: Within Functional Limits for tasks assessed Arousal/Alertness: Awake/alert Orientation Level: Oriented X4 Year: 2022 Month: December Day of Week: Correct Memory: Appears intact Awareness: Appears intact Problem Solving: Appears intact Safety/Judgment: Appears intact Sensation Sensation Light Touch: Impaired by gross assessment (entire LLE w/distal deficits >proximal but not intact entire LE, LUE impaired) Proprioception: Impaired Detail Proprioception Impaired Details: Impaired LUE;Impaired  LLE Coordination Gross Motor Movements are Fluid and Coordinated: No Fine Motor Movements are Fluid and Coordinated: No Coordination and Movement Description: L hemiplegia, unable to use LUE functionally at this time as he has less than 20% of active movement in arm, LLE 05 throughout Motor  Motor Motor: Hemiplegia;Abnormal tone Motor - Skilled Clinical Observations: dense LLE hemiplegia, hypotonic, significant core weakness w/poor sitting posture   Trunk/Postural Assessment  Postural Control Trunk Control: very weak and limited ability to engage with bed mobility , in sitting pt leans post L initially w/tendency to push w/RUE to L, w fatigue collapses  into uncontrolled trunk flexion wihtout max assist/support  Balance Balance Balance Assessed: Yes Dynamic Sitting Balance Sitting balance - Comments: unable to assess d/t poor static balance at edge of bed Extremity Assessment  RUE Assessment RUE Assessment: Within Functional Limits LUE Assessment LUE Assessment: Within Functional Limits Passive Range of Motion (PROM) Comments: WFL Active Range of Motion (AROM) Comments: developing acttive movement of finger flex and extension along with sh and elbow AROM General Strength Comments: trace shoulder, 2-/5 elbow, can grasp weakly but cannot extend fingers LUE Body System: Neuro Brunstrum levels for arm and hand: Arm;Hand Brunstrum level for arm: Stage II Synergy is developing Brunstrum level for hand: Stage II Synergy is developing RLE Assessment General Strength Comments: generalizdd weakness, hip grossly 3+/5 but cannot fully assess d/t difficulty w/sitting balance.  moves ankle knee thru full AROM LLE Assessment Passive Range of Motion (PROM) Comments: ankle DF to 0 w/stretching General Strength Comments: 0/5 throughout  Care Tool Care Tool Bed Mobility Roll left and right activity   Roll left and right assist level: 2 Helpers    Sit to lying activity   Sit to lying assist  level: 2 Helpers    Lying to sitting on side of bed activity   Lying to sitting on side of bed assist level: the ability to move from lying on the back to sitting on the side of the bed with no back support.: 2 Helpers     Care Tool Transfers Sit to stand transfer   Sit to stand assist level: 2 Helpers    Chair/bed transfer   Chair/bed transfer assist level: Dependent - Librarian, academic transfer activity did not occur: Safety/medical concerns        Care Tool Locomotion Ambulation Ambulation activity did not occur: Safety/medical concerns        Walk 10 feet activity Walk 10 feet activity did not occur: Safety/medical concerns       Walk 50 feet with 2 turns activity Walk 50 feet with 2 turns activity did not occur: Safety/medical concerns      Walk 150 feet activity Walk 150 feet activity did not occur: Safety/medical concerns      Walk 10 feet on uneven surfaces activity Walk 10 feet on uneven surfaces activity did not occur: Safety/medical concerns      Stairs Stair activity did not occur: Safety/medical concerns        Walk up/down 1 step activity Walk up/down 1 step or curb (drop down) activity did not occur: Safety/medical concerns      Walk up/down 4 steps activity Walk up/down 4 steps activity did not occur: Safety/medical concerns      Walk up/down 12 steps activity Walk up/down 12 steps activity did not occur: Safety/medical concerns      Pick up small objects from floor Pick up small object from the floor (from standing position) activity did not occur: Safety/medical concerns      Wheelchair Is the patient using a wheelchair?: Yes Type of Wheelchair: Manual Wheelchair activity did not occur: Safety/medical concerns      Wheel 50 feet with 2 turns activity Wheelchair 50 feet with 2 turns activity did not occur: Safety/medical concerns    Wheel 150 feet activity Wheelchair 150 feet activity did not  occur: Safety/medical concerns      Refer to Care Plan for Long Term Goals  SHORT TERM GOAL WEEK 1 PT Short  Term Goal 1 (Week 1): Pt will mainain static sitting balance w/multimodal cues and cga > 5 min PT Short Term Goal 2 (Week 1): Pt will tolerate OOB in wc x 3 hrs/day for participation in rehab therapy services w/stable VS PT Short Term Goal 3 (Week 1): Pt will tolerate initiation of training w/sliding board transfers PT Short Term Goal 4 (Week 1): Pt will roll to L side using bedrails w/max assist of 1  Recommendations for other services: Therapeutic Recreation  Outing/community reintegration  Skilled Therapeutic Intervention  Evaluation completed (see details above and below) with education on PT POC and goals and individual treatment initiated with focus on functional mobility/transfers, LE strength, dynamic and static sitting balance/coordination,  and improved endurance with activity Pt  educated re: team conference , schedule and D/c planning.  Pt provided w/ TIS wheelchair and roho cushion (d/t incontinence) and demonstrated use of Tilt feature/explained importance of use d/t impaired sitting endurance, balance, tolerance and sensory deficits.    Mobility Bed Mobility Bed Mobility: Rolling Right;Rolling Left;Supine to Sit;Sit to Supine;Scooting to [facility] Rolling Right: 2 Helpers Rolling Left: 2 Helpers Supine to Sit: 2 Helpers Sit to Supine: 2 Helpers Scooting to [facility]: 2 Helpers Transfers Transfers: Radio broadcast assistant (Assistive device): Other (Comment) Transfer via Lift Equipment: Maximove;Stedy (attempted via Stedy but unsafe total assist of 2 PTs) Locomotion  Gait Ambulation: No Gait Gait: No Stairs / Additional Locomotion Stairs: No Wheelchair Mobility Wheelchair Mobility: No (unable to transfer safely w/attempted method.)   Discharge Criteria: Patient will be discharged from PT if patient refuses treatment 3 consecutive times without medical  reason, if treatment goals not met, if there is a change in medical status, if patient makes no progress towards goals or if patient is discharged from hospital.  The above assessment, treatment plan, treatment alternatives and goals were discussed and mutually agreed upon: by patient Callie Fielding, [place] 02/28/2021, 5:09 PM

## 2021-02-28 NOTE — Evaluation (Signed)
Occupational Therapy Assessment and Plan  Patient Details  Name: Brent Young MRN: [id number] Date of Birth: 07-Jan-1961  OT Diagnosis: abnormal posture, acute pain, flaccid hemiplegia and hemiparesis, hemiplegia affecting non-dominant side, and muscle weakness (generalized) Rehab Potential: Rehab Potential (ACUTE ONLY): Good ELOS: 28-30 days   Today's Date: 02/28/2021 OT Individual Time: 0900-1000 OT Individual Time Calculation (min): 60 min     Hospital Problem: Principal Problem:   Infarction of right basal ganglia ([place])   Past Medical History:  Past Medical History:  Diagnosis Date   Allergy    Arthritis    Diabetes mellitus without complication ([place])    Hypertension    Stroke [facility])    Past Surgical History:  Past Surgical History:  Procedure Laterality Date   FRACTURE SURGERY Left    Patient fractured left arm   IR ANGIO INTRA EXTRACRAN SEL COM CAROTID INNOMINATE UNI L MOD SED  02/19/2021   IR ANGIO INTRA EXTRACRAN SEL INTERNAL CAROTID UNI R MOD SED  02/19/2021   IR ANGIO VERTEBRAL SEL SUBCLAVIAN INNOMINATE UNI R MOD SED  02/19/2021   IR ANGIO VERTEBRAL SEL VERTEBRAL UNI L MOD SED  02/19/2021   IR US GUIDE VASC ACCESS RIGHT  02/19/2021    Assessment & Plan Clinical Impression: .Brent Young is a 60 year old right-handed male with history of diabetes mellitus, hypertension, hyperlipidemia, history of lacunar infarcts, 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  Per chart review patient lives with spouse.  Two-level home bed and bath upstairs 3 steps to entry.  Presented 02/16/2021 with acute onset of left-sided weakness and he did roll out of his bed without loss of consciousness.  Admission chemistries unremarkable potassium 3.1, glucose 181, troponin 37-55, [id number].  Cranial CT scan negative.  CT angiogram head and neck poor opacification of right vertebral artery throughout its course with increased focal stenosis just distal to its takeoff.  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  Patient did not receive tPA.  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  Cerebral angiogram completed showed left ICA 65 to 70% stenosis, bilateral ICA siphon mild to moderate stenosis right VA stenosis.  Placed on aspirin 325 mg daily and Plavix 75 mg daily for 3 months then aspirin alone due to intracranial stenosis.  Echocardiogram with ejection fraction of 60 to 65% no wall motion abnormalities.  Elevated troponin felt to be secondary to demand ischemia.  Lovenox was added for DVT prophylaxis.  Presently on dysphagia #2 thin liquid diet.  Patient with persistent right shoulder pain x-rays without fracture or dislocation consistent with mild AC joint arthritis with supportive care.  Therapy evaluations completed due to patient's left-sided weakness decreased functional mobility was admitted for a comprehensive rehab program.    Patient transferred to CIR on 02/27/2021 .    Patient currently requires total with basic self-care skills secondary to muscle weakness and muscle joint tightness, decreased cardiorespiratoy endurance and decreased oxygen support, abnormal tone and unbalanced muscle activation, decreased attention to left, and decreased sitting balance, decreased standing balance, decreased postural control, hemiplegia, and decreased balance strategies.  Prior to hospitalization, patient could complete BADLs with modified independent .  Patient will benefit from skilled intervention to increase independence with basic self-care skills prior to discharge home with care  partner.  Anticipate patient will require 24 hour supervision and minimal physical assistance and follow up home health.  OT - End of Session Activity Tolerance: Tolerates 10 - 20 min  activity with multiple rests Endurance Deficit: Yes (requires 1L O2) OT Assessment Rehab Potential (ACUTE ONLY): Good OT Patient demonstrates impairments in the following area(s): Balance;Endurance;Motor;Sensory;Perception OT Basic ADL's Functional Problem(s): Grooming;Bathing;Dressing;Toileting OT Transfers Functional Problem(s): Toilet;Tub/Shower OT Additional Impairment(s): Fuctional Use of Upper Extremity OT Plan OT Intensity: Minimum of 1-2 x/day, 45 to 90 minutes OT Frequency: 5 out of 7 days OT Duration/Estimated Length of Stay: 28-30 days OT Treatment/Interventions: Balance/vestibular training;Discharge planning;Functional mobility training;DME/adaptive equipment instruction;Self Care/advanced ADL retraining;Therapeutic Activities;Therapeutic Exercise;UE/LE Strength taining/ROM;UE/LE Coordination activities;Visual/perceptual remediation/compensation;Patient/family education;Functional electrical stimulation;Pain management;Neuromuscular re-education OT Self Feeding Anticipated Outcome(s): independent OT Basic Self-Care Anticipated Outcome(s): MIn A OT Toileting Anticipated Outcome(s): Min A OT Bathroom Transfers Anticipated Outcome(s): Min A OT Recommendation Patient destination: Home Follow Up Recommendations: Home health OT Equipment Recommended: 3 in 1 bedside comode;To be determined   OT Evaluation Precautions/Restrictions  Precautions Precautions: Fall Precaution Comments: BP goal 130-160 per neuro MD; Dense L side deficits Other Brace: AFO for LLE    Vital Signs Therapy Vitals Temp: 98.6 F (37 C) Pulse Rate: 91 Resp: 18 BP: (!) 136/113 Patient Position (if appropriate): Lying Oxygen Therapy SpO2: 94 % O2 Device: Nasal Cannula O2 Flow Rate (L/min): 2 L/min Pain  7/10 in R shoulder, premedicated Home Living/Prior Functioning Home Living Available Help at Discharge: Family, Available 24 hours/day Type of Home: House Home Access: Stairs to enter Engineer, site of Steps: 2-3 Home Layout: Two level, Bed/bath upstairs Alternate Level Stairs-Number of Steps: flight Bathroom Shower/Tub: Tub/shower unit Additional Comments: sister has a room on first floor with walk in shower, he could stay in there  Lives With: Family, Son, Other (Comment) (mother and sister) Prior Function Level of Independence: Independent with basic ADLs, Independent with homemaking with ambulation, Independent with gait, Independent with transfers  Able to Take Stairs?: Yes Driving: Yes Vocation: Other (Comment) Vocation Requirements: pt was working on his Phd at time of first stroke in 2021, has not returned to school or work Vision Baseline Vision/History: 1 Wears glasses Ability to See in Adequate Light: 0 Adequate Patient Visual Report: No change from baseline Vision Assessment?: No apparent visual deficits;Yes Eye Alignment: Impaired (comment) (slight inward inversion of L eye) Ocular Range of Motion: Within Functional Limits Tracking/Visual Pursuits: Able to track stimulus in all quads without difficulty Saccades: Within functional limits Convergence: Within functional limits Visual Fields: No apparent deficits Perception  Perception: Impaired Inattention/Neglect: Does not attend to left side of body;Other (comment) (minimal inattention to L arm and leg) Praxis Praxis: Intact Cognition Overall Cognitive Status: Within Functional Limits for tasks assessed Arousal/Alertness: Awake/alert Orientation Level: Person;Place;Situation Person: Oriented Place: Oriented Situation: Oriented Year: 2022 Month: December Day of Week: Correct Memory: Appears intact Immediate Memory Recall: Sock;Blue;Bed Memory Recall Sock: Without Cue Memory Recall Blue: Without Cue Memory Recall Bed: Without Cue Awareness: Appears intact Safety/Judgment: Appears intact Sensation Sensation Light Touch: Appears Intact Hot/Cold: Not tested Proprioception: Impaired by gross  assessment Stereognosis: Impaired by gross assessment Coordination Gross Motor Movements are Fluid and Coordinated: No Fine Motor Movements are Fluid and Coordinated: No Coordination and Movement Description: L hemiplegia, unable to use LUE functionally at this time as he has less than 20% of active movement in arm Motor  Motor Motor: Hemiplegia  Trunk/Postural Assessment  Postural Control Postural Control: Deficits on evaluation Trunk Control:  very weak and limited ability to engage with bed mobility and moving to sit away from Brent Young  Severely impaired trunk control with moving from [facility] to sitting without support using R hand on hand rail  Extremity/Trunk Assessment RUE Assessment RUE Assessment: Within Functional Limits LUE Assessment Passive Range of Motion (PROM) Comments: WFL Active Range of Motion (AROM) Comments: developing acttive movement of finger flex and extension along with sh and elbow AROM General Strength Comments: 2- grasp LUE Body System: Neuro Brunstrum levels for arm and hand: Arm;Hand Brunstrum level for arm: Stage II Synergy is developing Brunstrum level for hand: Stage II Synergy is developing  Care Tool Care Tool Self Care Eating    Set up    Oral Care    Oral Care Assist Level: Set up assist    Bathing   Body parts bathed by patient: Chest;Abdomen;Front perineal area;Face Body parts bathed by helper: Right arm;Left arm;Buttocks;Left lower leg;Right lower leg;Left upper leg;Right upper leg   Assist Level: Maximal Assistance - Patient 24 - 49%    Upper Body Dressing(including orthotics)   What is the patient wearing?: Pull over shirt   Assist Level: Maximal Assistance - Patient 25 - 49%    Lower Body Dressing (excluding footwear)   What is the patient wearing?: Pants;Incontinence brief Assist for lower body dressing: 2 Helpers    Putting on/Taking off footwear   What is the patient wearing?: Non-skid slipper socks Assist for footwear:  Dependent - Patient 0%       Care Tool Toileting Toileting activity   Assist for toileting: 2 Helpers     Care Tool Bed Mobility Roll left and right activity   Roll left and right assist level: Maximal Assistance - Patient 25 - 49%    Sit to lying activity      Unable to assess due to safety concerns    Lying to sitting on side of bed activity      Unable to assess due to safety concerns     Care Tool Transfers Sit to stand transfer    Unable to assess due to safety concerns    Chair/bed transfer    Unable to assess due to safety concerns       Toilet transfer        Unable to assess due to safety concerns   Care Tool Cognition  Expression of Ideas and Wants Expression of Ideas and Wants: 4. Without difficulty (complex and basic) - expresses complex messages without difficulty and with speech that is clear and easy to understand  Understanding Verbal and Non-Verbal Content Understanding Verbal and Non-Verbal Content: 4. Understands (complex and basic) - clear comprehension without cues or repetitions   Memory/Recall Ability Memory/Recall Ability : Current season;Location of own room;Staff names and faces;That he or she is in a hospital/hospital unit   Refer to Care Plan for [place] 1 OT Short Term Goal 1 (Week 1): Pt will be able to roll onto his R side with min A to A caregivers. OT Short Term Goal 2 (Week 1): Pt will be able to sit to EOB with mod A of 1. OT Short Term Goal 3 (Week 1): Pt will sit at EOB with min A to wash UB. OT Short Term Goal 4 (Week 1): Pt will be able to wash L arm with min A by actively lifting it partially away from his body. OT Short Term Goal 5 (Week 1): Pt will  be able to rise to stand with max A of 2 to prepare for toileting.  Recommendations for other services: None    Skilled Therapeutic Intervention ADL ADL Eating: Set up Grooming: Setup Upper Body Bathing: Moderate assistance Where Assessed-Upper  Body Bathing: Bed level Lower Body Bathing: Maximal assistance Where Assessed-Lower Body Bathing: Bed level Upper Body Dressing: Maximal assistance Where Assessed-Upper Body Dressing: Bed level Lower Body Dressing: Dependent Where Assessed-Lower Body Dressing: Bed level Toileting: Unable to assess Toilet Transfer: Unable to assess   Pt seen for initial evaluation and ADL training with a focus on basic bed mobility and use of his core strength. Pt able to engage well in session and understood clearly the role of OT, discussed his POC and goals.  He does have active movement of his LUE but not enough to engage in function. Due to his size and severely limited torso and LLE movement (flaccid), only did self care from bed level. Pt initially needed to use bed pan, only had a small void.  Unable to do any EOB or transfers today due to time constraints and did not have all equipment available. Pt resting in bed with all needs met.  Bed alarm set.    Discharge Criteria: Patient will be discharged from OT if patient refuses treatment 3 consecutive times without medical reason, if treatment goals not met, if there is a change in medical status, if patient makes no progress towards goals or if patient is discharged from hospital.  The above assessment, treatment plan, treatment alternatives and goals were discussed and mutually agreed upon: by patient  [place] 02/28/2021, 2:26 PM

## 2021-02-28 NOTE — Progress Notes (Signed)
Patient ID: KAMON FAHR, male   DOB: Dec 16, 1960, 60 y.o.   MRN: [id number]  SW spoke pt son [name] [phone]) ot introduce self, explain role, and discuss discharge process. He confirms he will likely be primary support for patient. He is aware SW will continue to provide updates, and will follow=up after team conference on Tuesday.   [name], MSW, LCSWA Office: [phone] Cell: [phone] Fax: [phone]

## 2021-02-28 NOTE — Progress Notes (Addendum)
PROGRESS NOTE   Subjective/Complaints: Pt had a pretty good night. Did have some anxiety about being in a new place and what day would hold on rehab.   ROS: Patient denies fever, rash, sore throat, blurred vision, nausea, vomiting, diarrhea, cough, shortness of breath or chest pain, joint or back pain, headache .    Objective:   DG CHEST PORT 1 VIEW  Result Date: 02/27/2021 CLINICAL DATA:  Dyspnea, code stroke EXAM: PORTABLE CHEST 1 VIEW COMPARISON:  Chest radiograph 02/21/2021 FINDINGS: The heart is enlarged, unchanged. The mediastinal contours are stable. There is vascular congestion with probable mild pulmonary interstitial edema. Linear opacities projecting over both mid lungs likely reflects scarring and/or atelectasis, not significantly changed. There is no significant pleural effusion. There is no pneumothorax. There is no acute osseous abnormality. IMPRESSION: 1. Unchanged cardiomegaly with mild pulmonary interstitial edema. 2. Unchanged linear atelectasis and/or scar projecting over both mid lungs. No new focal airspace disease. Electronically Signed   By: Lesia Hausen M.D.   On: 02/27/2021 11:41   Recent Labs    02/27/21 0346 02/28/21 0515  WBC 8.1 8.9  HGB 12.8* 12.9*  HCT 39.9 39.3  PLT 262 267   Recent Labs    02/27/21 0346 02/28/21 0515  NA 137 138  K 3.6 3.9  CL 97* 95*  CO2 33* 33*  GLUCOSE 101* 152*  BUN 15 14  CREATININE 0.68 0.79  CALCIUM 9.1 9.0    Intake/Output Summary (Last 24 hours) at 02/28/2021 0747 Last data filed at 02/28/2021 0500 Gross per 24 hour  Intake 360 ml  Output 1150 ml  Net -790 ml        Physical Exam: Vital Signs Blood pressure 124/79, pulse 88, temperature 98.5 F (36.9 C), resp. rate 17, height 6\' 1"  (1.854 m), weight 118.9 kg, SpO2 95 %.  General: Alert and oriented x 3, No apparent distress HEENT: Head is normocephalic, atraumatic, PERRLA, EOMI, sclera anicteric,  oral mucosa pink and moist, dentition intact, ext ear canals clear,  Neck: Supple without JVD or lymphadenopathy Heart: Reg rate and rhythm. No murmurs rubs or gallops Chest: CTA bilaterally without wheezes, rales, or rhonchi; no distress Abdomen: Soft, non-tender, non-distended, bowel sounds positive. Extremities: No clubbing, cyanosis, or edema. Pulses are 2+ Psych: Pt's affect is appropriate. Pt is cooperative Skin: a few scattered abrasions Neuro:  Pt is alert and oriented x 3. Mild left central VII, mild left hemi-facial sensory loss. Reasonable insight, awareness and memory. Normal language, mild dysarthria. LUE 2+ deltoid, 3- biceps, tricep, 3- to 3 wrist/ HI. LLE: trace to 1 HF, KE , tr-0/5 distally--extension synergy. Left arm and leg 1/2 light touch. No resting tone.  Musculoskeletal: left heel cord is tight, -15 degrees. No tenderness or pain in trunk/limbs except right shoulder with mild pain at [facility] jt with palpation and AROM.    Assessment/Plan: 1. Functional deficits which require 3+ hours per day of interdisciplinary therapy in a comprehensive inpatient rehab setting. Physiatrist is providing close team supervision and 24 hour management of active medical problems listed below. Physiatrist and rehab team continue to assess barriers to discharge/monitor patient progress toward functional and medical goals  Care  Tool:  Bathing              Bathing assist       Upper Body Dressing/Undressing Upper body dressing        Upper body assist      Lower Body Dressing/Undressing Lower body dressing            Lower body assist       Toileting Toileting    Toileting assist       Transfers Chair/bed transfer  Transfers assist  Chair/bed transfer activity did not occur: Safety/medical concerns        Locomotion Ambulation   Ambulation assist              Walk 10 feet activity   Assist           Walk 50 feet activity   Assist            Walk 150 feet activity   Assist           Walk 10 feet on uneven surface  activity   Assist           Wheelchair     Assist               Wheelchair 50 feet with 2 turns activity    Assist            Wheelchair 150 feet activity     Assist          Blood pressure 124/79, pulse 88, temperature 98.5 F (36.9 C), resp. rate 17, height 6\' 1"  (1.854 m), weight 118.9 kg, SpO2 95 %.  Medical Problem List and Plan: 1. Functional deficits secondary to right basal ganglia corona radiata infarct secondary to small vessel disease as well as history of lacunar infarcts as well as Hippocampal ICH 2021             -patient may  shower             -ELOS/Goals: 18-21 days- min-mod A  -Patient is beginning CIR therapies today including PT, OT, and SLP  2.  Antithrombotics: -DVT/anticoagulation:  Pharmaceutical: Lovenox             -antiplatelet therapy: Aspirin 325 mg daily and Plavix 75 mg daily x3 months then aspirin alone 3. Pain Management: R shoulder pain from fall -Neurontin 300 mg 3 times daily, oxycodone as needed -ROM, strengthening of R shoulder with therapies 4. Mood:              -antipsychotic agents: N/A 5. Neuropsych: This patient is capable of making decisions on his own behalf. 6. Skin/Wound Care: local care as needed 7. Fluids/Electrolytes/Nutrition: Routine in and out with follow-up chemistries 8.  Dysphagia.  Dysphagia #2 thin liquids.   -advance per speech therapy 9.  Hypertension.  Norvasc 10 mg daily Avapro 75 mg daily, Toprol-XL 25 mg daily  12/9 controlled 10.  Diabetes mellitus.  Hemoglobin A1c 9.9.  SSI, Tradjenta 5 mg daily, Glucophage 1000 mg twice daily.   CBG (last 3)  Recent Labs    02/27/21 1640 02/27/21 2120 02/28/21 0544  GLUCAP 96 140* 143*   12/9 reasonable control at present--continue regimen 11.  Hyperlipidemia.  Lipitor 12.  Tobacco use.  Counseling 13.  Seizure prophylaxis.  Trileptal 300 mg 3 times  daily 14. Constipation- since admit- 2 are documented -hasn't been eating much (or it hasn't been documented) -add senna-s at bedtime -sorbitol prn if needed -encourage appropriate  PO intake 15. Pulmonary edema- has been using 2L O2 as needed-  -CXR 12/8 shows mild pulmonary edema -wean off O2 as tolerated -12/9 lungs sounded clear on exam today, no distress     LOS: 1 days A FACE TO FACE EVALUATION WAS PERFORMED  Ranelle Oyster 02/28/2021, 7:47 AM

## 2021-02-28 NOTE — Discharge Instructions (Addendum)
Inpatient Rehab Discharge Instructions  Brent Young Discharge date and time: No discharge date for patient encounter.   Activities/Precautions/ Functional Status: Activity: activity as tolerated Diet:  Wound Care: Routine skin checks Functional status:  ___ No restrictions     ___ Walk up steps independently ___ 24/7 supervision/assistance   ___ Walk up steps with assistance ___ Intermittent supervision/assistance  ___ Bathe/dress independently ___ Walk with walker     __x_ Bathe/dress with assistance ___ Walk Independently    ___ Shower independently ___ Walk with assistance    ___ Shower with assistance ___ No alcohol     ___ Return to work/school ________   COMMUNITY REFERRALS UPON DISCHARGE:    Home Health:   PT     OT     ST    RN    SNA    SW                   Agency: Phone:    Medical Equipment/Items Ordered: hospital bed and drop arm bedside commode                                                 Agency/Supplier: [employer] [phone]  Medical Equipment/Items Ordered: loaner w/c                                                 Agency/Supplier: [employer] [phone]  GENERAL COMMUNITY RESOURCES FOR PATIENT/FAMILY: Reminder you have been set up for [facility] Transportation [phone]. This service can be used for transportation needs to any [facility] provider. Please call within 2-3 business days to arrange transportation needs.    Special Instructions:  No driving smoking or alcohol  Aspirin 325 mg daily and Plavix 75 mg daily x3 months then aspirin alone  My questions have been answered and I understand these instructions. I will adhere to these goals and the provided educational materials after my discharge from the hospital.  Patient/Caregiver Signature _______________________________ Date __________  Clinician Signature _______________________________________ Date __________  Please bring this form and your medication list with you to all  your follow-up doctor's appointments.  STROKE/TIA DISCHARGE INSTRUCTIONS SMOKING Cigarette smoking nearly doubles your risk of having a stroke & is the single most alterable risk factor  If you smoke or have smoked in the last 12 months, you are advised to quit smoking for your health. Most of the excess cardiovascular risk related to smoking disappears within a year of stopping. Ask you doctor about anti-smoking medications [place] Quit Line: 1-800-QUIT NOW Free Smoking Cessation Classes [phone]  CHOLESTEROL Know your levels; limit fat & cholesterol in your diet  Lipid Panel     Component Value Date/Time   CHOL 148 02/17/2021 0328   CHOL 141 10/08/2020 1140   TRIG 120 02/17/2021 0328   HDL 26 (L) 02/17/2021 0328   HDL 43 10/08/2020 1140   CHOLHDL 5.7 02/17/2021 0328   VLDL 24 02/17/2021 0328   LDLCALC 98 02/17/2021 0328   LDLCALC 75 10/08/2020 1140     Many patients benefit from treatment even if their cholesterol is at goal. Goal: Total Cholesterol (CHOL) less than 160 Goal:  Triglycerides (TRIG) less than 150 Goal:  HDL greater  than 40 Goal:  LDL (LDLCALC) less than 100   BLOOD PRESSURE American Stroke Association blood pressure target is less that 120/80 mm/Hg  Your discharge blood pressure is:  BP: 126/76 Monitor your blood pressure Limit your salt and alcohol intake Many individuals will require more than one medication for high blood pressure  DIABETES (A1c is a blood sugar average for last 3 months) Goal HGBA1c is under 7% (HBGA1c is blood sugar average for last 3 months)  Diabetes:   Lab Results  Component Value Date   HGBA1C 9.9 (H) 02/17/2021    Your HGBA1c can be lowered with medications, healthy diet, and exercise. Check your blood sugar as directed by your physician Call your physician if you experience unexplained or low blood sugars.  PHYSICAL ACTIVITY/REHABILITATION Goal is 30 minutes at least 4 days per week  Activity: Increase activity  slowly, Therapies: Physical Therapy: Home Health Return to work:  Activity decreases your risk of heart attack and stroke and makes your heart stronger.  It helps control your weight and blood pressure; helps you relax and can improve your mood. Participate in a regular exercise program. Talk with your doctor about the best form of exercise for you (dancing, walking, swimming, cycling).  DIET/WEIGHT Goal is to maintain a healthy weight  Your discharge diet is:  Diet Order             DIET DYS 2 Room service appropriate? Yes; Fluid consistency: Thin  Diet effective now                   liquids Your height is:  Height: 6\' 1"  (185.4 cm) Your current weight is: Weight: 118.9 kg Your Body Mass Index (BMI) is:  BMI (Calculated): 34.59 Following the type of diet specifically designed for you will help prevent another stroke. Your goal weight range is:   Your goal Body Mass Index (BMI) is 19-24. Healthy food habits can help reduce 3 risk factors for stroke:  High cholesterol, hypertension, and excess weight.  RESOURCES Stroke/Support Group:  Call [phone]   STROKE EDUCATION PROVIDED/REVIEWED AND GIVEN TO PATIENT Stroke warning signs and symptoms How to activate emergency medical system (call 911). Medications prescribed at discharge. Need for follow-up after discharge. Personal risk factors for stroke. Pneumonia vaccine given: No Flu vaccine given: No My questions have been answered, the writing is legible, and I understand these instructions.  I will adhere to these goals & educational materials that have been provided to me after my discharge from the hospital.

## 2021-02-28 NOTE — Evaluation (Signed)
Speech Language Pathology Assessment and Plan  Patient Details  Name: Brent Young MRN: [id number] Date of Birth: 06/28/1960  SLP Diagnosis: Dysphagia  Rehab Potential: Excellent ELOS: 28-30 days for shorter for SLP    Today's Date: 02/28/2021 SLP Individual Time: 1035-1130 SLP Individual Time Calculation (min): 93 min   Hospital Problem: Principal Problem:   Infarction of right basal ganglia (HCC)  Past Medical History:  Past Medical History:  Diagnosis Date   Allergy    Arthritis    Diabetes mellitus without complication ([place])    Hypertension    Stroke [facility])    Past Surgical History:  Past Surgical History:  Procedure Laterality Date   FRACTURE SURGERY Left    Patient fractured left arm   IR ANGIO INTRA EXTRACRAN SEL COM CAROTID INNOMINATE UNI L MOD SED  02/19/2021   IR ANGIO INTRA EXTRACRAN SEL INTERNAL CAROTID UNI R MOD SED  02/19/2021   IR ANGIO VERTEBRAL SEL SUBCLAVIAN INNOMINATE UNI R MOD SED  02/19/2021   IR ANGIO VERTEBRAL SEL VERTEBRAL UNI L MOD SED  02/19/2021   IR US GUIDE VASC ACCESS RIGHT  02/19/2021    Assessment / Plan / Recommendation Clinical Impression Patient is a 60 year old right-handed male with history of diabetes mellitus, hypertension, hyperlipidemia, history of lacunar infarcts, history of hippocampal [place] 2021, tobacco use.  Presented 02/16/2021 with acute onset of left-sided weakness and he did roll out of his bed without loss of consciousness.   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  Therapy evaluations completed with recommendations or a comprehensive rehab program. Patient admitted 02/27/21.  Patient administered an informal cognitive evaluation, and all cognitive domains appear [facility] for tasks assessed. Patient's auditory comprehension and verbal expression also appeared [facility]. Patient also administered a BSE. Patient with h/o dysphagia after previous stroke. Patient's most recent modified barium  swallow (MBS) was at [facility] outpatient services on 03-12-20. MBS showed mild pharyngeal dysphagia characterized by delayed initiation of the swallow, delayed epiglottic inversion, reduced vestibular closure with intermittent backflow of barium into the pyriform sinuses contributing up to min residue after the swallow. Frequent penetration also occurred with thin liquids. Recommendations were made to continue regular textures with thin liquids. During BSE today, patient required encouragement for an upright posture and reported difficulty with solid textures while on acute resulting in a downgrade to Dys. 2 textures. Patient initially with consistent throat clearing with sips of thin liquids with intermittent coughing noted. Patient observed to pour large sips of thin liquids from the bottle into his oral cavity with minimal lip seal although his oral strength and ROM were WFL. Overt coughing and throat clearing were eliminated when patient cued to consume small sips while utilizing a tight lip seal for increased oral control.  Patient with mildly prolonged mastication with solid textures with complete oral clearance but with an intermittent abnormal breathing pattern (on supplemental O2). No overt s/s of aspiration noted.  Recommend patient continue current diet with focus on utilization of swallowing compensatory strategies to maximize safety. Recommend initiating pharyngeal strengthening exercises and trials of upgraded textures with SLP. If consistent overt s/s of aspiration persist, an MBS may be warranted to assess current swallow function due to current CVA with deconditioning.  Patient verbalized understanding and agreement of plan. Patient would benefit from skilled SLP intervention to maximize his swallowing function prior to discharge.    Skilled Therapeutic Interventions          Administered a BSE  and cognitive-linguistic evaluation, please see above for details.   SLP Assessment  Patient will need  skilled Speech Lanaguage Pathology Services during CIR admission    Recommendations  SLP Diet Recommendations: Dysphagia 2 (Fine chop);Thin Liquid Administration via: Cup;Straw Medication Administration: Whole meds with puree Supervision: Patient able to self feed;Intermittent supervision to cue for compensatory strategies Compensations: Slow rate;Small sips/bites;Minimize environmental distractions Postural Changes and/or Swallow Maneuvers: Seated upright 90 degrees;Upright 30-60 min after meal Oral Care Recommendations: Oral care BID Patient destination: Home Follow up Recommendations: None Equipment Recommended: None recommended by SLP    SLP Frequency 3 to 5 out of 7 days   SLP Duration  SLP Intensity  SLP Treatment/Interventions 28-30 days for shorter for SLP  Minumum of 1-2 x/day, 30 to 90 minutes  Dysphagia/aspiration precaution training;Environmental controls;Cueing hierarchy;Therapeutic Activities;Functional tasks;Patient/family education    Pain No/Denies Pain   Prior Functioning Type of Home: House  Lives With: Family;Son;Other (Comment) (mother and sister) Available Help at Discharge: Family;Available 24 hours/day Vocation: Other (Comment)  SLP Evaluation Cognition Overall Cognitive Status: Within Functional Limits for tasks assessed Arousal/Alertness: Awake/alert Orientation Level: Oriented X4 Year: 2022 Month: December Day of Week: Correct Memory: Appears intact Immediate Memory Recall: Sock;Blue;Bed Memory Recall Sock: Without Cue Memory Recall Blue: Without Cue Memory Recall Bed: Without Cue Awareness: Appears intact Problem Solving: Appears intact Safety/Judgment: Appears intact  Comprehension Auditory Comprehension Overall Auditory Comprehension: Appears within functional limits for tasks assessed Expression Expression Primary Mode of Expression: Verbal Verbal Expression Overall Verbal Expression: Appears within functional limits for tasks  assessed Written Expression Dominant Hand: Right Oral Motor Oral Motor/Sensory Function Overall Oral Motor/Sensory Function: Within functional limits Motor Speech Overall Motor Speech: Appears within functional limits for tasks assessed  Care Tool Care Tool Cognition Ability to hear (with hearing aid or hearing appliances if normally used Ability to hear (with hearing aid or hearing appliances if normally used): 0. Adequate - no difficulty in normal conservation, social interaction, listening to TV   Expression of Ideas and Wants Expression of Ideas and Wants: 4. Without difficulty (complex and basic) - expresses complex messages without difficulty and with speech that is clear and easy to understand   Understanding Verbal and Non-Verbal Content Understanding Verbal and Non-Verbal Content: 4. Understands (complex and basic) - clear comprehension without cues or repetitions  Memory/Recall Ability Memory/Recall Ability : Current season;Location of own room;Staff names and faces;That he or she is in a hospital/hospital unit    Bedside Swallowing Assessment General Date of Onset: 02/16/21 Previous Swallow Assessment: 02/18/21 BSE - rec Dys3/thin Diet Prior to this Study: Dysphagia 2 (chopped);Thin liquids Temperature Spikes Noted: No Respiratory Status: Supplemental O2 delivered via (comment) History of Recent Intubation: No Behavior/Cognition: Alert;Cooperative;Pleasant mood Oral Cavity - Dentition: Adequate natural dentition Self-Feeding Abilities: Able to feed self;Needs set up Vision: Functional for self-feeding Patient Positioning: Upright in bed Baseline Vocal Quality: Normal Volitional Cough: Strong Volitional Swallow: Able to elicit  Ice Chips Ice chips: Not tested Thin Liquid Thin Liquid: Impaired Presentation: Cup;Self Fed;Straw Pharyngeal  Phase Impairments: Throat Clearing - Immediate;Cough - Immediate Nectar Thick Nectar Thick Liquid: Not tested Honey Thick Honey  Thick Liquid: Not tested Puree Puree: Within functional limits Presentation: Self Fed;Spoon Solid Solid: Within functional limits Presentation: Self Fed;Spoon BSE Assessment Risk for Aspiration Impact on safety and function: Mild aspiration risk Other Related Risk Factors: History of dysphagia  Short Term Goals: Week 1: SLP Short Term Goal 1 (Week 1): Patient will consume current diet with minimal overt  s/s of aspiration with supervision level verbal cues for use of swallowing compensatory strategies. SLP Short Term Goal 2 (Week 1): Patient will demonstrate efficient mastication with complete oral clearance without overt s/s of aspiration over 2 sessions prior to upgrade with supervision level verbal cues. SLP Short Term Goal 3 (Week 1): Patient will perform pharyngeal strengthening exercises with supervision level verbal cues for accuracy.  Refer to Care Plan for Long Term Goals  Recommendations for other services: None   Discharge Criteria: Patient will be discharged from SLP if patient refuses treatment 3 consecutive times without medical reason, if treatment goals not met, if there is a change in medical status, if patient makes no progress towards goals or if patient is discharged from hospital.  The above assessment, treatment plan, treatment alternatives and goals were discussed and mutually agreed upon: by patient  Mahamadou Weltz 02/28/2021, 3:34 PM

## 2021-03-01 LAB — GLUCOSE, CAPILLARY
Glucose-Capillary: 146 mg/dL — ABNORMAL HIGH (ref 70–99)
Glucose-Capillary: 139 mg/dL — ABNORMAL HIGH (ref 70–99)
Glucose-Capillary: 123 mg/dL — ABNORMAL HIGH (ref 70–99)
Glucose-Capillary: 112 mg/dL — ABNORMAL HIGH (ref 70–99)

## 2021-03-01 NOTE — Progress Notes (Signed)
Physical Therapy Session Note  Patient Details  Name: Brent Young MRN: [id number] Date of Birth: Aug 15, 1960  Today's Date: 03/01/2021 PT Individual Time: 0900-1015 PT Individual Time Calculation (min): 75 min   Short Term Goals: Week 1:  PT Short Term Goal 1 (Week 1): Pt will mainain static sitting balance w/multimodal cues and cga > 5 min PT Short Term Goal 2 (Week 1): Pt will tolerate OOB in wc x 3 hrs/day for participation in rehab therapy services w/stable VS PT Short Term Goal 3 (Week 1): Pt will tolerate initiation of training w/sliding board transfers PT Short Term Goal 4 (Week 1): Pt will roll to L side using bedrails w/max assist of 1  Skilled Therapeutic Interventions/Progress Updates:    pt received in bed and agreeable to therapy. Pt reports 7/10 pain in his shoulder, addressed with rest and positioning.Pt instructed in bed mobility with max multimodal cueing to coordinate motion. Supine> sit with mod +2. While scooting with tot A and multimodal cues, pt discovered he can push through L elbow and achieve active elbow extension. Pt performed several reps with VC/tc and tapping facilitation, +2 assist for sitting balance. slideboard transfer with tot A +2, instructional cues throughout. Pt demoes difficulty maintaining foot contact with the floor and pushing through BLE. Pt transported to therapy gym for time management and energy conservation. Pt then directed in progression to Sit to stand with +2 assist. Several small stands before coming upright x 2. Assist to power up, block knee, and max cueing throughout for upright posture. Pt became emotional with standing for the first time and feeling encouragement over standing and feeling his arm move actively. Pt agreeable to staying in TIS until next session. Returned to room and pt remained in chair with LUE supported with pillows, pt was left with all needs in reach and alarm active.   Therapy Documentation Precautions:   Precautions Precautions: Fall Precaution Comments: BP goal 130-160 per neuro MD; Dense L side deficits Other Brace: AFO for LLE Restrictions Weight Bearing Restrictions: No   Therapy/Group: Individual Therapy  Juluis Rainier 03/01/2021, 10:43 AM

## 2021-03-01 NOTE — Progress Notes (Signed)
Physical Therapy Session Note  Patient Details  Name: Brent Young MRN: [id number] Date of Birth: 29-Jan-1961  Today's Date: 03/01/2021 PT Individual Time: 9373-4287 PT Individual Time Calculation (min): 57 min   Short Term Goals: Week 1:  PT Short Term Goal 1 (Week 1): Pt will mainain static sitting balance w/multimodal cues and cga > 5 min PT Short Term Goal 2 (Week 1): Pt will tolerate OOB in wc x 3 hrs/day for participation in rehab therapy services w/stable VS PT Short Term Goal 3 (Week 1): Pt will tolerate initiation of training w/sliding board transfers PT Short Term Goal 4 (Week 1): Pt will roll to L side using bedrails w/max assist of 1  Skilled Therapeutic Interventions/Progress Updates: Pt presents sitting in w/c and agreeable to therapy.  Pt just finished w/ PT and does state some fatigue but happy w/ performance.  Pt wheeled to main gym for energy conservation.  Pt performed seated balance using mirror for visual input for midline sitting.  PT facilitating L hand and elbow w/ cueing for list to left.  Pt performing forward lean and then back to midline, reclining back and then returning to midline.  Pt required seated rest breaks 2/2 fatigue.  Pt returned to room and remained sitting in TIS w/ chair alarm on and all needs in reach.     Therapy Documentation Precautions:  Precautions Precautions: Fall Precaution Comments: BP goal 130-160 per neuro MD; Dense L side deficits Other Brace: AFO for LLE Restrictions Weight Bearing Restrictions: No General:   Vital Signs:  Pain:0/10 Pain Assessment Pain Scale: 0-10 Pain Score: 5  Pain Type: Acute pain Pain Location: Shoulder Pain Orientation: Right Pain Descriptors / Indicators: Aching;Discomfort Pain Intervention(s): Medication (See eMAR) Mobility:     Therapy/Group: Individual Therapy  Lucio Edward 03/01/2021, 11:15 AM

## 2021-03-01 NOTE — Progress Notes (Signed)
PROGRESS NOTE   Subjective/Complaints:  Pt reports no pain except R shoulder and can get really painful.  Wondering about therapy schedule.    ROS:  Pt denies SOB, abd pain, CP, N/V/C/D, and vision changes    Objective:   DG CHEST PORT 1 VIEW  Result Date: 02/27/2021 CLINICAL DATA:  Dyspnea, code stroke EXAM: PORTABLE CHEST 1 VIEW COMPARISON:  Chest radiograph 02/21/2021 FINDINGS: The heart is enlarged, unchanged. The mediastinal contours are stable. There is vascular congestion with probable mild pulmonary interstitial edema. Linear opacities projecting over both mid lungs likely reflects scarring and/or atelectasis, not significantly changed. There is no significant pleural effusion. There is no pneumothorax. There is no acute osseous abnormality. IMPRESSION: 1. Unchanged cardiomegaly with mild pulmonary interstitial edema. 2. Unchanged linear atelectasis and/or scar projecting over both mid lungs. No new focal airspace disease. Electronically Signed   By: Lesia Hausen M.D.   On: 02/27/2021 11:41   Recent Labs    02/27/21 0346 02/28/21 0515  WBC 8.1 8.9  HGB 12.8* 12.9*  HCT 39.9 39.3  PLT 262 267   Recent Labs    02/27/21 0346 02/28/21 0515  NA 137 138  K 3.6 3.9  CL 97* 95*  CO2 33* 33*  GLUCOSE 101* 152*  BUN 15 14  CREATININE 0.68 0.79  CALCIUM 9.1 9.0    Intake/Output Summary (Last 24 hours) at 03/01/2021 1020 Last data filed at 03/01/2021 0733 Gross per 24 hour  Intake 240 ml  Output --  Net 240 ml        Physical Exam: Vital Signs Blood pressure (!) 142/85, pulse 88, temperature 98.6 F (37 C), resp. rate 19, height 6\' 1"  (1.854 m), weight 118.9 kg, SpO2 95 %.   General: awake, alert, appropriate, sitting up in bed; NAD HENT: conjugate gaze; oropharynx moist CV: regular rate; no JVD Pulmonary: CTA B/L; no W/R/R- good air movement GI: soft, NT, ND, (+)BS Psychiatric: appropriate;  interactive Neurological: alert; dysarthric Extremities: No clubbing, cyanosis, or edema. Pulses are 2+ Psych: Pt's affect is appropriate. Pt is cooperative Skin: a few scattered abrasions Neuro:  Pt is alert and oriented x 3. Mild left central VII, mild left hemi-facial sensory loss. Reasonable insight, awareness and memory. Normal language, mild dysarthria. LUE 2+ deltoid, 3- biceps, tricep, 3- to 3 wrist/ HI. LLE: trace to 1 HF, KE , tr-0/5 distally--extension synergy. Left arm and leg 1/2 light touch. No resting tone.  Musculoskeletal: left heel cord is tight, -15 degrees. No tenderness or pain in trunk/limbs except right shoulder with mild pain at [facility] jt with palpation and AROM.    Assessment/Plan: 1. Functional deficits which require 3+ hours per day of interdisciplinary therapy in a comprehensive inpatient rehab setting. Physiatrist is providing close team supervision and 24 hour management of active medical problems listed below. Physiatrist and rehab team continue to assess barriers to discharge/monitor patient progress toward functional and medical goals  Care Tool:  Bathing    Body parts bathed by patient: Chest, Abdomen, Front perineal area, Face   Body parts bathed by helper: Right arm, Left arm, Buttocks, Left lower leg, Right lower leg, Left upper leg, Right upper leg  Bathing assist Assist Level: Maximal Assistance - Patient 24 - 49%     Upper Body Dressing/Undressing Upper body dressing   What is the patient wearing?: Pull over shirt    Upper body assist Assist Level: Maximal Assistance - Patient 25 - 49%    Lower Body Dressing/Undressing Lower body dressing      What is the patient wearing?: Incontinence brief     Lower body assist Assist for lower body dressing: 2 Helpers     Toileting Toileting    Toileting assist Assist for toileting: 2 Helpers     Transfers Chair/bed transfer  Transfers assist  Chair/bed transfer activity did not occur:  Safety/medical concerns  Chair/bed transfer assist level: Dependent - mechanical lift     Locomotion Ambulation   Ambulation assist   Ambulation activity did not occur: Safety/medical concerns          Walk 10 feet activity   Assist  Walk 10 feet activity did not occur: Safety/medical concerns        Walk 50 feet activity   Assist Walk 50 feet with 2 turns activity did not occur: Safety/medical concerns         Walk 150 feet activity   Assist Walk 150 feet activity did not occur: Safety/medical concerns         Walk 10 feet on uneven surface  activity   Assist Walk 10 feet on uneven surfaces activity did not occur: Safety/medical concerns         Wheelchair     Assist Is the patient using a wheelchair?: Yes Type of Wheelchair: Manual Wheelchair activity did not occur: Safety/medical concerns         Wheelchair 50 feet with 2 turns activity    Assist    Wheelchair 50 feet with 2 turns activity did not occur: Safety/medical concerns       Wheelchair 150 feet activity     Assist  Wheelchair 150 feet activity did not occur: Safety/medical concerns       Blood pressure (!) 142/85, pulse 88, temperature 98.6 F (37 C), resp. rate 19, height 6\' 1"  (1.854 m), weight 118.9 kg, SpO2 95 %.  Medical Problem List and Plan: 1. 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  Antithrombotics: -DVT/anticoagulation:  Pharmaceutical: Lovenox             -antiplatelet therapy: Aspirin 325 mg daily and Plavix 75 mg daily x3 months then aspirin alone 3. Pain Management: R shoulder pain from fall -Neurontin 300 mg 3 times daily, oxycodone as needed -ROM, strengthening of R shoulder with  therapies 4. Mood:              -antipsychotic agents: N/A 5. Neuropsych: This patient is capable of making decisions on his own behalf. 6. Skin/Wound Care: local care as needed 7. Fluids/Electrolytes/Nutrition: Routine in and out with follow-up chemistries 8.  Dysphagia.  Dysphagia #2 thin liquids.   -advance per speech therapy 9.  Hypertension.  Norvasc 10 mg daily Avapro 75 mg daily, Toprol-XL 25 mg daily  12/10- BP very slightly elevated - con't regimen and monitor for trend  10.  Diabetes mellitus.  Hemoglobin A1c 9.9.  SSI, Tradjenta 5 mg daily, Glucophage 1000 mg twice daily.   CBG (last 3)  Recent Labs    02/28/21 1714 02/28/21 2104 03/01/21 0612  GLUCAP 118* 114* 146*   12/10- CBGs controlled- con't regimen 11.  Hyperlipidemia.  Lipitor 12.  Tobacco use.  Counseling 13.  Seizure prophylaxis.  Trileptal 300 mg 3 times daily 14. Constipation- since admit- 2 are documented -hasn't been eating much (or it hasn't been documented) -add senna-s at bedtime -sorbitol prn if needed -encourage appropriate PO intake 15. Pulmonary edema- has been using 2L O2 as needed-  -CXR 12/8 shows mild pulmonary edema -wean off O2 as tolerated -12/9 lungs sounded clear on exam today, no distress  12/10- still on 2L- will remind nursing to wean as tolerated    LOS: 2 days A FACE TO FACE EVALUATION WAS PERFORMED  Monserrath Junio 03/01/2021, 10:20 AM

## 2021-03-02 LAB — GLUCOSE, CAPILLARY
Glucose-Capillary: 115 mg/dL — ABNORMAL HIGH (ref 70–99)
Glucose-Capillary: 129 mg/dL — ABNORMAL HIGH (ref 70–99)
Glucose-Capillary: 103 mg/dL — ABNORMAL HIGH (ref 70–99)
Glucose-Capillary: 131 mg/dL — ABNORMAL HIGH (ref 70–99)

## 2021-03-02 NOTE — Progress Notes (Signed)
PROGRESS NOTE   Subjective/Complaints:  Pt reports did much better yesterday than planned- stood in parallel bars- really happy, tearful about this.  Also felt LUE move some.  Off therapy today. But wants more therapy.   Getting some curling of L hand/L foot per pt.   Hasn't been able to wean O2 so far, per pt and nursing note.  ROS:   Pt denies SOB, abd pain, CP, N/V/C/D, and vision changes  Objective:   No results found. Recent Labs    02/28/21 0515  WBC 8.9  HGB 12.9*  HCT 39.3  PLT 267   Recent Labs    02/28/21 0515  NA 138  K 3.9  CL 95*  CO2 33*  GLUCOSE 152*  BUN 14  CREATININE 0.79  CALCIUM 9.0    Intake/Output Summary (Last 24 hours) at 03/02/2021 1217 Last data filed at 03/02/2021 0400 Gross per 24 hour  Intake 480 ml  Output 475 ml  Net 5 ml        Physical Exam: Vital Signs Blood pressure 131/82, pulse 84, temperature 98 F (36.7 C), resp. rate 19, height 6\' 1"  (1.854 m), weight 118.9 kg, SpO2 93 %.    General: awake, alert, appropriate, sitting up in bed; NAD HENT: conjugate gaze; oropharynx moist CV: regular rate; no JVD Pulmonary: CTA B/L; no W/R/R- good air movement; O2 in place 2L GI: soft, NT, ND, (+)BS Psychiatric: appropriate; tearful when happy about movement Neurological: alert- dysarthric MS: mild curling of L hand/fingers- L foot in PRAFO but twisted- <90 degrees of pROM of ankle/DF until did ROM Extremities: No clubbing, cyanosis, or edema. Pulses are 2+ Skin: a few scattered abrasions Neuro:  Pt is alert and oriented x 3. Mild left central VII, mild left hemi-facial sensory loss. Reasonable insight, awareness and memory. Normal language, mild dysarthria. LUE 2+ deltoid, 3- biceps, tricep, 3- to 3 wrist/ HI. LLE: trace to 1 HF, KE , tr-0/5 distally--extension synergy. Left arm and leg 1/2 light touch. No resting tone.  Musculoskeletal: left heel cord is tight, -15  degrees. No tenderness or pain in trunk/limbs except right shoulder with mild pain at [facility] jt with palpation and AROM.    Assessment/Plan: 1. Functional deficits which require 3+ hours per day of interdisciplinary therapy in a comprehensive inpatient rehab setting. Physiatrist is providing close team supervision and 24 hour management of active medical problems listed below. Physiatrist and rehab team continue to assess barriers to discharge/monitor patient progress toward functional and medical goals  Care Tool:  Bathing    Body parts bathed by patient: Chest, Abdomen, Front perineal area, Face   Body parts bathed by helper: Right arm, Left arm, Buttocks, Left lower leg, Right lower leg, Left upper leg, Right upper leg     Bathing assist Assist Level: Maximal Assistance - Patient 24 - 49%     Upper Body Dressing/Undressing Upper body dressing   What is the patient wearing?: Pull over shirt    Upper body assist Assist Level: Maximal Assistance - Patient 25 - 49%    Lower Body Dressing/Undressing Lower body dressing      What is the patient wearing?: Incontinence brief  Lower body assist Assist for lower body dressing: 2 Helpers     Toileting Toileting    Toileting assist Assist for toileting: 2 Helpers     Transfers Chair/bed transfer  Transfers assist  Chair/bed transfer activity did not occur: Safety/medical concerns  Chair/bed transfer assist level: 2 Helpers     Locomotion Ambulation   Ambulation assist   Ambulation activity did not occur: Safety/medical concerns          Walk 10 feet activity   Assist  Walk 10 feet activity did not occur: Safety/medical concerns        Walk 50 feet activity   Assist Walk 50 feet with 2 turns activity did not occur: Safety/medical concerns         Walk 150 feet activity   Assist Walk 150 feet activity did not occur: Safety/medical concerns         Walk 10 feet on uneven surface   activity   Assist Walk 10 feet on uneven surfaces activity did not occur: Safety/medical concerns         Wheelchair     Assist Is the patient using a wheelchair?: Yes Type of Wheelchair: Manual Wheelchair activity did not occur: Safety/medical concerns         Wheelchair 50 feet with 2 turns activity    Assist    Wheelchair 50 feet with 2 turns activity did not occur: Safety/medical concerns       Wheelchair 150 feet activity     Assist  Wheelchair 150 feet activity did not occur: Safety/medical concerns       Blood pressure 131/82, pulse 84, temperature 98 F (36.7 C), resp. rate 19, height 6\' 1"  (1.854 m), weight 118.9 kg, SpO2 93 %.  Medical Problem List and Plan: 1. 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  Antithrombotics: -DVT/anticoagulation:  Pharmaceutical: Lovenox             -antiplatelet therapy: Aspirin 325 mg daily and Plavix 75 mg daily x3 months then aspirin alone 3. Pain Management: R shoulder pain from fall -Neurontin 300 mg 3 times daily, oxycodone as needed -ROM, strengthening of R shoulder with therapies  12/11- pain doing better with therapy; might benefit from Baclofen  vs later Botox due to mild curling of L hand/finger/L foot  4. Mood:   12/11- pt tearful easily- not sure if situational vs related to stroke- will monitor             -antipsychotic agents: N/A 5. Neuropsych: This patient is capable of making decisions on his own behalf. 6. Skin/Wound Care: local care as needed 7. Fluids/Electrolytes/Nutrition: Routine in and out with follow-up chemistries 8.  Dysphagia.  Dysphagia #2 thin liquids.   -advance per speech therapy 9.  Hypertension.  Norvasc 10 mg daily Avapro 75  mg daily, Toprol-XL 25 mg daily  12/11- BP elevated yesterday but better today- con't regimen 10.  Diabetes mellitus.  Hemoglobin A1c 9.9.  SSI, Tradjenta 5 mg daily, Glucophage 1000 mg twice daily.   CBG (last 3)  Recent Labs    03/01/21 2105 03/02/21 0600 03/02/21 1137  GLUCAP 112*  115* 129*   12/10-11- CBGs controlled- con't regimen 11.  Hyperlipidemia.  Lipitor 12.  Tobacco use.  Counseling 13.  Seizure prophylaxis.  Trileptal 300 mg 3 times daily 14. Constipation- since admit- 2 are documented -hasn't been eating much (or it hasn't been documented) -add senna-s at bedtime -sorbitol prn if needed -encourage appropriate PO intake 15. Pulmonary edema- has been using 2L O2 as needed-  -CXR 12/8 shows mild pulmonary edema -wean off O2 as tolerated -12/9 lungs sounded clear on exam today, no distress  12/10- still on 2L- will remind nursing to wean as tolerated   12/11- nursing has attempted to wean, but hasn't been able to so far- as gets up and moves, hopefully, this improves.  Will order flutter valve q2 hours while awake   LOS: 3 days A FACE TO FACE EVALUATION WAS PERFORMED  Tommy Goostree 03/02/2021, 12:17 PM

## 2021-03-02 NOTE — Progress Notes (Signed)
Patient wearing oxygen nasal cannula 2L throughout the night. At 0510 during morning vitals NT noted oxygen saturation to be at 85%. Patient had removed the nasal cannula. No signs of distress noted. Nasal Cannula was replaced and oxygen saturation came up to 93% in ~1 min. Unable to wean oxygen at this time.

## 2021-03-03 LAB — GLUCOSE, CAPILLARY
Glucose-Capillary: 106 mg/dL — ABNORMAL HIGH (ref 70–99)
Glucose-Capillary: 138 mg/dL — ABNORMAL HIGH (ref 70–99)
Glucose-Capillary: 99 mg/dL (ref 70–99)
Glucose-Capillary: 95 mg/dL (ref 70–99)

## 2021-03-03 NOTE — Progress Notes (Signed)
Orthopedic Tech Progress Note Patient Details:  Brent Young [birth year] [id number]  Called in order to [employer] for a NIGHT SPLINT   Patient ID: Brent Young, male   DOB: 06/07/1960, 60 y.o.   MRN: [id number]  [name] 03/03/2021, 9:39 AM

## 2021-03-03 NOTE — Progress Notes (Signed)
Occupational Therapy Session Note  Patient Details  Name: Brent Young MRN: [id number] Date of Birth: [birth year]  Today's Date: 03/03/2021 Session 1 OT Individual Time: 4496-7591 OT Individual Time Calculation (min): 57 min   Session 2 OT Individual Time: 1300-1400 OT Individual Time Calculation (min): 60 min    Short Term Goals: Week 1:  OT Short Term Goal 1 (Week 1): Pt will be able to roll onto his R side with min A to A caregivers. OT Short Term Goal 2 (Week 1): Pt will be able to sit to EOB with mod A of 1. OT Short Term Goal 3 (Week 1): Pt will sit at EOB with min A to wash UB. OT Short Term Goal 4 (Week 1): Pt will be able to wash L arm with min A by actively lifting it partially away from his body. OT Short Term Goal 5 (Week 1): Pt will be able to rise to stand with max A of 2 to prepare for toileting.  Skilled Therapeutic Interventions/Progress Updates:    Session 1 Pt greeted seated in TIS wc and agreeable to OT treatment session. PT declined BADL tasks at this time. Pt brought to therapy gym and worked on sit<>stands in standing frame. Focus on upright hip and trunk while coming into standing. Pt tolerated 3 standing bouts of 3-5 minutes with rest breaks in between. L UE function with graded large peg board task. OT provided guided assist to promote normal movement patterns.Verbal cues for grasp and release of pegs as well. Pt returned to room and left seated in TIS wc with alarm belt on, call bell in reach, and needs met.   No pain reported  Session 2 Pt greeted seated in TIS wc and agreeable to OT treatment session focused on self-care retraining. Pt brought to the sink in wc and worked on functional use of L UE to reach and turn on water with guided A from OT. L UR NMR within bathing tasks with pt able to integrate L UE with mod A from OT. Pt needed verbal cues for thoroughness and initiation. Pt also on 2.5L of O2 throughout session with SpO2 at 92-95%. Sit<>stands in  [place] with max A +2 and facilitation at trunk and hips to promote full upright posture. Also used mirror feedback to help keep pt at midline. Pt only tolerated standing for 15-30 second intervals so multiple stands were necessary from perched St[place] position for OT asisstst with washing buttocks and donning clean brief. +2 needed for this to maintain standing balance while +2 assisted with self-care. OT worked on [employer] positioning and was able to elongate L footrest and add cushion as pt reported foot rest digging into bottom of foot. OT also adjusted head/neck positioning in wc as well using [employer], [employer]. Pt left seated in TIS wc at end of session with alarm on, call bell in reach, and needs met.  Therapy Documentation Precautions:  Precautions Precautions: Fall Precaution Comments: BP goal 130-160 per neuro MD; Dense L side deficits Other Brace: AFO for LLE Restrictions Weight Bearing Restrictions: No in: Pain Assessment Pain Scale: 0-10 Pain Score: 5  Pain Location: Shoulder Pain Orientation: Right Pain Intervention(s): REpositioned     Therapy/Group: Individual Therapy  Valma Cava 03/03/2021, 2:07 PM

## 2021-03-03 NOTE — Care Management (Signed)
Inpatient Rehabilitation Center Individual Statement of Services  Patient Name:  Brent Young  Date:  03/03/2021  Welcome to the Inpatient Rehabilitation Center.  Our goal is to provide you with an individualized program based on your diagnosis and situation, designed to meet your specific needs.  With this comprehensive rehabilitation program, you will be expected to participate in at least 3 hours of rehabilitation therapies Monday-Friday, with modified therapy programming on the weekends.  Your rehabilitation program will include the following services:  Physical Therapy (PT), Occupational Therapy (OT), Speech Therapy (ST), 24 hour per day rehabilitation nursing, Therapeutic Recreaction (TR), Psychology, Neuropsychology, Care Coordinator, Rehabilitation Medicine, Nutrition Services, Pharmacy Services, and Other  Weekly team conferences will be held on Tuesdays to discuss your progress.  Your Inpatient Rehabilitation Care Coordinator will talk with you frequently to get your input and to update you on team discussions.  Team conferences with you and your family in attendance may also be held.  Expected length of stay: 28-30 days    Overall anticipated outcome: Minimal Assistance  Depending on your progress and recovery, your program may change. Your Inpatient Rehabilitation Care Coordinator will coordinate services and will keep you informed of any changes. Your Inpatient Rehabilitation Care Coordinator's name and contact numbers are listed  below.  The following services may also be recommended but are not provided by the Inpatient Rehabilitation Center:  Driving Evaluations Home Health Rehabiltiation Services Outpatient Rehabilitation Services Vocational Rehabilitation   Arrangements will be made to provide these services after discharge if needed.  Arrangements include referral to agencies that provide these services.  Your insurance has been verified to be:  Uninsured  Your  primary doctor is:  Gwinda Passe  Pertinent information will be shared with your doctor and your insurance company.  Inpatient Rehabilitation Care Coordinator:  Susie Cassette [phone] or ([phone]  Information discussed with and copy given to patient by: Gretchen Short, 03/03/2021, 9:09 AM

## 2021-03-03 NOTE — Progress Notes (Signed)
Physical Therapy Session Note  Patient Details  Name: Brent Young MRN: [id number] Date of Birth: [birth year]  Today's Date: 03/03/2021 PT Individual Time: 1941-7408 and 1448-1856 PT Individual Time Calculation (min): 53 min and 27 min  Short Term Goals: Week 1:  PT Short Term Goal 1 (Week 1): Pt will mainain static sitting balance w/multimodal cues and cga > 5 min PT Short Term Goal 2 (Week 1): Pt will tolerate OOB in wc x 3 hrs/day for participation in rehab therapy services w/stable VS PT Short Term Goal 3 (Week 1): Pt will tolerate initiation of training w/sliding board transfers PT Short Term Goal 4 (Week 1): Pt will roll to L side using bedrails w/max assist of 1  Skilled Therapeutic Interventions/Progress Updates:     1st Session: Pt received supine in bed and agrees to therapy. No complaint of pain. PT threads pants over each leg and pt performs supine bridging to assist with pulling up pants. Pt performs supine to sit with maxA and cues for sequencing and body mechanics. Pt performs sliding board transfer from bed to [facility] with maxA and cues for head hips relationship. Pt has difficulty with body mechanics and has tendency for pushing to the L. Sliding board and tilt in space WC both begin to slide during transfer, with pt in precarious position between bed and WC. PT able to shift pt's hips enough into WC for support, and then pt is brought into fulling reclined position in TIS WC to facilitate posterior hip shift. WC transport to gym. WC to mat transfers with sliding board and modA/maxA +2 and much improved safety and use of body mechanics. Session focused on sitting balance with mirror positioned for visual feedback. Pt demos slow drift to the L but at times is able to maintain static sitting balance with verbal and tactile cues for posture, and no further physical assistance. As pt fatigues, pt requires more consistent minA and occasional modA to maintain midline orientation. PT  provides multimodal cueing for scapular retraction to promote trunk extension and improved posture, as well as craniosacral cueing for optimal posture and balance. Pt also tends to flex forward more at trunk and hips with fatigue. Pt performs sliding board transfer from mat to [facility] with maxA +2. Left seated with alarm intact and all needs within reach.   2nd Session: Pt received seated in [facility] and agrees to therapy. No complaint of pain. Session focused on adjustment of pt's tilt in space WC for improved fit and safety. PT adjusts pt's headrest for optimal support. Pt performs sliding board transfer from [facility] to bed with maxA +2 and cues for head hips relationship, initiation, and sequencing. Sit to supine with maxA +2 and cues for positioning. PT tightens pt's brakes for improved safety. Pt left supine with alarm intact and all needs within reach.  Therapy Documentation Precautions:  Precautions Precautions: Fall Precaution Comments: BP goal 130-160 per neuro MD; Dense L side deficits Other Brace: AFO for LLE Restrictions Weight Bearing Restrictions: No   Therapy/Group: Individual Therapy  Jacquenette Shone, DPT 03/03/2021, 4:19 PM

## 2021-03-03 NOTE — Progress Notes (Signed)
Inpatient Rehabilitation Care Coordinator Assessment and Plan Patient Details  Name: Brent Young MRN: [id number] Date of Birth: 07-04-1960  Today's Date: 03/03/2021  Hospital Problems: Principal Problem:   Infarction of right basal ganglia [facility])  Past Medical History:  Past Medical History:  Diagnosis Date   Allergy    Arthritis    Diabetes mellitus without complication ([place])    Hypertension    Stroke [facility])    Past Surgical History:  Past Surgical History:  Procedure Laterality Date   FRACTURE SURGERY Left    Patient fractured left arm   IR ANGIO INTRA EXTRACRAN SEL COM CAROTID INNOMINATE UNI L MOD SED  02/19/2021   IR ANGIO INTRA EXTRACRAN SEL INTERNAL CAROTID UNI R MOD SED  02/19/2021   IR ANGIO VERTEBRAL SEL SUBCLAVIAN INNOMINATE UNI R MOD SED  02/19/2021   IR ANGIO VERTEBRAL SEL VERTEBRAL UNI L MOD SED  02/19/2021   IR US GUIDE VASC ACCESS RIGHT  02/19/2021   Social History:  reports that he has been smoking cigarettes. He has never used smokeless tobacco. He reports current alcohol use. He reports current drug use. Drug: Marijuana.  Family / Support Systems Marital Status: Widow/Widower How Long?: a few years ago. Patient Roles: Parent Spouse/Significant Other: Widowed Children: adult son Brent Young Other Supports: sister Brent Young Anticipated Caregiver: son Brent Young Ability/Limitations of Caregiver: Pt son Brent Young to provide 24.7 care Caregiver Availability: 24/7 Family Dynamics: Pt lives with his mother, sister Brent Young and son Brent Young.  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, manufacturing systems - How often do you need to have someone help you when you read instructions, pamphlets, or other written material from your doctor or pharmacy?: Rarely Writes: Yes Employment Status: Disabled Public relations account executive Issues: Denies Guardian/Conservator: N/A    Abuse/Neglect Abuse/Neglect Assessment Can Be Completed: Yes Physical Abuse: Denies Verbal Abuse: Denies Sexual Abuse: Denies Exploitation of patient/patient's resources: Denies Self-Neglect: Denies  Patient response to: Social Isolation - How often do you feel lonely or isolated from those around you?: Never  Emotional Status Pt's affect, behavior and adjustment status: Pt in good spirits at time of visit Recent Psychosocial Issues: Denies Psychiatric History: Denies Substance Abuse History: pt admits he smokes marijuana atleast once a month  Patient / Family Perceptions, Expectations & Goals Pt/Family understanding of illness & functional limitations: Pt and family have a general understanding of pt care needs Premorbid pt/family roles/activities: Independent Anticipated changes in roles/activities/participation: Assistance with ADLs/IADLs Pt/family expectations/goals: Pt goal is to be able to walk and to get strong enough so he can.  Community Resources [employer]s: None Premorbid Home Care/DME Agencies: None Transportation available at discharge: son Is the patient able to respond to transportation needs?: Yes In the past 12 months, has lack of transportation kept you from medical appointments or from getting medications?: No In the past 12 months, has lack of transportation kept you from meetings, work, or from getting things needed for daily living?: No Resource referrals recommended: Neuropsychology  Discharge Planning Living Arrangements: Children, Other relatives, Parent Support Systems: Children, Other relatives Type of Residence: Private residence Insurance Resources: Teacher, adult education Resources: Family Support Financial Screen Referred: Yes Living Expenses: Lives with family Money Management: Family Does the patient have any problems obtaining your medications?: No Home Management: Pt prepared meals. All helped with managing home. Patient/Family  Preliminary Plans: TBD Care Coordinator Barriers to Discharge: Decreased caregiver support, Lack of/limited family support, Insurance for SNF coverage Care  Coordinator Barriers to Discharge Comments: Pt is uninsured. Care Coordinator Anticipated Follow Up Needs: HH/OP Expected length of stay: 28-30 days  Clinical Impression SW met with pt in room to introduce self, explain role, and discuss discharge process. Pt is not a English as a second language teacher. No HCPOA. DME: single point cane. Pt aware SW to follow-up with his son Brent Young.   33- SW followed up with pt son Brent Young inform on ELOS and will follow-up tomorrow with updates from team conference.   Brent Young A Ples Trudel 03/03/2021, 5:06 PM

## 2021-03-03 NOTE — Progress Notes (Signed)
PROGRESS NOTE   Subjective/Complaints:  Had a pretty good weekend. Encouraged by the fact he was able to stand. Felt his left arm working more as well. Still on oxygen for low sats.. says he can get them up on his own if he takes deep breaths  ROS: Patient denies fever, rash, sore throat, blurred vision, nausea, vomiting, diarrhea, cough, shortness of breath or chest pain, joint or back pain, headache, or mood change.   Objective:   No results found. No results for input(s): WBC, HGB, HCT, PLT in the last 72 hours.  No results for input(s): NA, K, CL, CO2, GLUCOSE, BUN, CREATININE, CALCIUM in the last 72 hours.   Intake/Output Summary (Last 24 hours) at 03/03/2021 0905 Last data filed at 03/03/2021 0815 Gross per 24 hour  Intake 720 ml  Output 775 ml  Net -55 ml        Physical Exam: Vital Signs Blood pressure (!) 144/97, pulse 82, temperature 98.3 F (36.8 C), temperature source Oral, resp. rate 20, height 6\' 1"  (1.854 m), weight 122.2 kg, SpO2 97 %.    Constitutional: No distress . Vital signs reviewed. HEENT: NCAT, EOMI, oral membranes moist, O2 [place] Neck: supple Cardiovascular: RRR without murmur. No JVD    Respiratory/Chest: CTA Bilaterally without wheezes or rales. Normal effort    GI/Abdomen: BS +, non-tender, non-distended Ext: no clubbing, cyanosis, or edema Psych: pleasant and cooperative  Skin: a few scattered abrasions Neuro:  Pt is alert and oriented x 3. Mild left central VII, mild left hemi-facial sensory loss. Reasonable insight, awareness and memory. Normal language, mild dysarthria. LUE 2+ deltoid, 3- biceps, tricep,  3-to 3 wrist/ HI. LLE: trace to 1 HF, KE , tr-0/5 distally--extension synergy. Left arm and leg 1/2 light touch. No resting tone.  Musculoskeletal: left heel cord is tight but can be stretched to nearly 90. No tenderness or pain in trunk/limbs except right shoulder with mild pain at [facility]  jt with palpation and AROM.    Assessment/Plan: 1. Functional deficits which require 3+ hours per day of interdisciplinary therapy in a comprehensive inpatient rehab setting. Physiatrist is providing close team supervision and 24 hour management of active medical problems listed below. Physiatrist and rehab team continue to assess barriers to discharge/monitor patient progress toward functional and medical goals  Care Tool:  Bathing    Body parts bathed by patient: Chest, Abdomen, Front perineal area, Face   Body parts bathed by helper: Right arm, Left arm, Buttocks, Left lower leg, Right lower leg, Left upper leg, Right upper leg     Bathing assist Assist Level: Maximal Assistance - Patient 24 - 49%     Upper Body Dressing/Undressing Upper body dressing   What is the patient wearing?: Pull over shirt    Upper body assist Assist Level: Maximal Assistance - Patient 25 - 49%    Lower Body Dressing/Undressing Lower body dressing      What is the patient wearing?: Incontinence brief     Lower body assist Assist for lower body dressing: 2 Helpers     Toileting Toileting    Toileting assist Assist for toileting: 2 Helpers     Transfers Chair/bed  transfer  Transfers assist  Chair/bed transfer activity did not occur: Safety/medical concerns  Chair/bed transfer assist level: 2 Helpers     Locomotion Ambulation   Ambulation assist   Ambulation activity did not occur: Safety/medical concerns          Walk 10 feet activity   Assist  Walk 10 feet activity did not occur: Safety/medical concerns        Walk 50 feet activity   Assist Walk 50 feet with 2 turns activity did not occur: Safety/medical concerns         Walk 150 feet activity   Assist Walk 150 feet activity did not occur: Safety/medical concerns         Walk 10 feet on uneven surface  activity   Assist Walk 10 feet on uneven surfaces activity did not occur: Safety/medical  concerns         Wheelchair     Assist Is the patient using a wheelchair?: Yes Type of Wheelchair: Manual Wheelchair activity did not occur: Safety/medical concerns         Wheelchair 50 feet with 2 turns activity    Assist    Wheelchair 50 feet with 2 turns activity did not occur: Safety/medical concerns       Wheelchair 150 feet activity     Assist  Wheelchair 150 feet activity did not occur: Safety/medical concerns       Blood pressure (!) 144/97, pulse 82, temperature 98.3 F (36.8 C), temperature source Oral, resp. rate 20, height 6\' 1"  (1.854 m), weight 122.2 kg, SpO2 97 %.  Medical Problem List and Plan: 1. 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  Antithrombotics: -DVT/anticoagulation:  Pharmaceutical: Lovenox             -antiplatelet therapy: Aspirin 325 mg daily and Plavix 75 mg daily x3 months then aspirin alone 3. Pain Management: R shoulder pain from fall -Neurontin 300 mg 3 times daily, oxycodone as needed -ROM, strengthening of R shoulder with therapies  -PRAFO RLE---discussed stretches with patient. Might order night splint 4. Mood:   12/12 in good spirits             -antipsychotic agents: N/A 5. Neuropsych: This patient is capable of making decisions on his own behalf. 6. Skin/Wound Care: local care as needed 7. Fluids/Electrolytes/Nutrition: Routine in and out with follow-up chemistries 8.  Dysphagia.  Dysphagia #2 thin liquids.   -advance per speech therapy 9.  Hypertension.  Norvasc 10 mg daily Avapro 75 mg daily, Toprol-XL 25 mg daily  12/12- BP borderline, continue regimen 10.  Diabetes mellitus.  Hemoglobin A1c 9.9.  SSI, Tradjenta 5 mg daily, Glucophage  1000 mg twice daily.   CBG (last 3)  Recent Labs    03/02/21 1555 03/02/21 2101 03/03/21 0555  GLUCAP 103* 131* 106*   12/12 cbg's controlled 11.  Hyperlipidemia.  Lipitor 12.  Tobacco use.  Counseling 13.  Seizure prophylaxis.  Trileptal 300 mg 3 times daily 14. Constipation-  -had bm 12/9 - senna-s at bedtime -sorbitol prn if needed  -needs bm 12/12 15. Pulmonary edema/hypoxia- has been using  2L O2 as needed-  -CXR 12/8 shows mild pulmonary edema -wean off O2 as tolerated 12/12 encourage deep breathing, IS/FV, OOB     LOS: 4 days A FACE TO FACE EVALUATION WAS PERFORMED  Ranelle Oyster 03/03/2021, 9:05 AM

## 2021-03-04 LAB — GLUCOSE, CAPILLARY
Glucose-Capillary: 102 mg/dL — ABNORMAL HIGH (ref 70–99)
Glucose-Capillary: 121 mg/dL — ABNORMAL HIGH (ref 70–99)
Glucose-Capillary: 98 mg/dL (ref 70–99)
Glucose-Capillary: 93 mg/dL (ref 70–99)
Glucose-Capillary: 124 mg/dL — ABNORMAL HIGH (ref 70–99)

## 2021-03-04 MED ORDER — SORBITOL 70 % SOLN
60.0000 mL | Freq: Once | Status: AC
  Administered 2021-03-04: 60 mL via ORAL
  Filled 2021-03-04: qty 60

## 2021-03-04 NOTE — Progress Notes (Signed)
Patient ID: Brent Young, male   DOB: [birth year], 60 y.o.   MRN: [id number]  SW emailed [name] about support that pt will have a disability greater than 12 months. SW waiting on follow-up.  SW met with pt and pt son [name] in the room to provide updates from team conference, and d/c date 03/28/2021. SW discussed barriers to d/c being uninsured. SW encouraged pt son to look for DME: DABSC, TTB, w/c, and RW. SW explained charity HH process and pt will be set up for [facility] medication assistance program if able too. SW informed on above about disability greater than 12 months and waiting to hear if an application will be completed. SW will f/u. Fam edu scheduled for 03/24/21 9am-12pm with pt son and his sister.   [name], MSW, [place] Office: [phone] Cell: [phone] Fax: [phone]

## 2021-03-04 NOTE — Progress Notes (Addendum)
Occupational Therapy Session Note  Patient Details  Name: Brent Young MRN: [id number] Date of Birth: 1961-03-15  Today's Date: 03/04/2021 OT Individual Time: 0900-1015 OT Individual Time Calculation (min): 75 min    Short Term Goals: Week 1:  OT Short Term Goal 1 (Week 1): Pt will be able to roll onto his R side with min A to A caregivers. OT Short Term Goal 2 (Week 1): Pt will be able to sit to EOB with mod A of 1. OT Short Term Goal 3 (Week 1): Pt will sit at EOB with min A to wash UB. OT Short Term Goal 4 (Week 1): Pt will be able to wash L arm with min A by actively lifting it partially away from his body. OT Short Term Goal 5 (Week 1): Pt will be able to rise to stand with max A of 2 to prepare for toileting.  Skilled Therapeutic Interventions/Progress Updates:  Pt greeted  seated in w/c   agreeable to OT intervention. Pt reports feeling "loopy" and "disoriented." Provided education on orientation using external cue of schedule as pt with no recall of earlier OT coming for session, pt reports "the nurse got me up." Session focus on BADL reeducation, dynamic sitting balance, functional transfers and AAROM with LUE. Pt requested to brush teeth at sink, MIN A needed to apply toothpaste and open toothbrush with education provided regarding hemi techniques. Pt transported to gym with total A for time mgmt, pt completed SB transfer from TIS>EOM with MAX A +2 going to R side. Pt needing up to [facility] for static sitting balance with moments of CGA, pt needed visual cue with mirror to maintain midline position. Worked on lap slides with LUE with pt able to complete ~ 5 reps before fatiguing. Pt completed self ROM shoulder flexion to ~ 60 * only, elbow flexion/ extension from sitting EOM with pt needing MIN A for balance during task. Pt completed additional SB transfer from EOM>TIS with MAX A +3 ( second person for safety). Pt transported back to room with total A. Worked on [employer] from w/c with pt  completing hand over hand towels slides forward/backward and R<>L. Worked on grasp and release with LUE with pt able to grasp small water bottle and wash cloth, issued foam block to work on digit flexion/extension.   pt left seated in TIS with safety belt activated and all needs within reach.                     Pt on 2L during session with SpO2 >93% during session   Therapy Documentation Precautions:  Precautions Precautions: Fall Precaution Comments: BP goal 130-160 per neuro MD; Dense L side deficits Other Brace: AFO for LLE Restrictions Weight Bearing Restrictions: No  Pain: unrated pain in RUE, provided rest breaks and repositioning as needed.     Therapy/Group: Individual Therapy  Pollyann Glen [facility] 03/04/2021, 12:07 PM

## 2021-03-04 NOTE — Progress Notes (Signed)
Pt states he feels "loopy". Pt is alert and oriented x4. Pt oxygen on 2L [place]. Vitals obtained. BP 127/91, HR 76, o2 94%. CBG 121. MD aware. Pt confusion mentioned at conference this AM. Mylo Red, LPN

## 2021-03-04 NOTE — Progress Notes (Signed)
Speech Language Pathology Daily Session Note  Patient Details  Name: Brent Young MRN: [id number] Date of Birth: 12-24-1960  Today's Date: 03/04/2021 SLP Individual Time: 1105-1130 SLP Individual Time Calculation (min): 25 min  Short Term Goals: Week 1: SLP Short Term Goal 1 (Week 1): Patient will consume current diet with minimal overt s/s of aspiration with supervision level verbal cues for use of swallowing compensatory strategies. SLP Short Term Goal 2 (Week 1): Patient will demonstrate efficient mastication with complete oral clearance without overt s/s of aspiration over 2 sessions prior to upgrade with supervision level verbal cues. SLP Short Term Goal 3 (Week 1): Patient will perform pharyngeal strengthening exercises with supervision level verbal cues for accuracy.  Skilled Therapeutic Interventions: Skilled treatment session focused on dysphagia goals. Upon arrival, patient was upright in the wheelchair and reported feeling mildly "confused," suspect due to pain medications. RN aware. SLP facilitated session by providing trials of regular textures. Patient consumed trials with efficient mastication and complete oral clearance without overt s/s of aspiration, therefore, recommend patient upgrade to Dys. 3 textures. Throughout trials, patient consumed thin liquids via cup with supervision level verbal cues for use of compensatory strategies with overt cough X 1 out of 8 oz. Recommend patient continue thin liquids. SLP provided education regarding swallowing compensatory strategies to maximize safety with PO intake, patient verbalized understanding and agreement. Patient left upright in wheelchair with alarm on and all needs within reach. Continue with current plan of care.     Pain Pain Assessment Pain Score: 5   Therapy/Group: Individual Therapy  Charise Leinbach 03/04/2021, 12:02 PM

## 2021-03-04 NOTE — Progress Notes (Signed)
Occupational Therapy Session Note  Patient Details  Name: Brent Young MRN: [id number] Date of Birth: Jul 23, 1960  Today's Date: 03/04/2021 OT Individual Time: 2224-1146 OT Individual Time Calculation (min): 55 min    Short Term Goals: Week 1:  OT Short Term Goal 1 (Week 1): Pt will be able to roll onto his R side with min A to A caregivers. OT Short Term Goal 2 (Week 1): Pt will be able to sit to EOB with mod A of 1. OT Short Term Goal 3 (Week 1): Pt will sit at EOB with min A to wash UB. OT Short Term Goal 4 (Week 1): Pt will be able to wash L arm with min A by actively lifting it partially away from his body. OT Short Term Goal 5 (Week 1): Pt will be able to rise to stand with max A of 2 to prepare for toileting.  Skilled Therapeutic Interventions/Progress Updates:    Pt greeted semi-reclined in bed and agreeable to OT treatment session. Worked on rolling L and R with pt able to roll L with mod A, but needed max+2 to roll R with hand over hand to grab rail with R hand. Total for peri-care and brief change with incontinence of bladder. Worked on threading pants at bed level, with pt able to help pull pants some over hips, but still max+2 to complete task. Pt completed bed mobility with max A +2. Lateral lean to the  L in sitting. Pt on 2L throughout OT treatment session. Pt reported lightheadedness in sitting. SpO2 98%, BP 152/86. 2 trials and raised bed for sit<>stand in [employer]. Pt transferred to TIS wc in [place], UB bathing/dressing at the sink with focus on forced use of L UE for neuro re-ed. Pt needed cues for hemi dressing techniques and still max A . Pt left seated in TIS wc with alarm belt on, call bell in reach, and needs met.   Therapy Documentation Precautions:  Precautions Precautions: Fall Precaution Comments: BP goal 130-160 per neuro MD; Dense L side deficits Other Brace: AFO for LLE Restrictions Weight Bearing Restrictions: No Pain: Pain Assessment Pain Scale:  0-10 Pain Score: 4 Pain Location: Shoulder Pain Orientation: Right Pain Intervention(s): Rest,   Therapy/Group: Individual Therapy  Valma Cava 03/04/2021, 8:36 AM

## 2021-03-04 NOTE — Progress Notes (Addendum)
PROGRESS NOTE   Subjective/Complaints:  No problems when I saw him this morning other than his left side still feeling week. Later this morning, complained of feeling loopy. VS ok. Wore night splint although it caused heel discomfort  ROS: Patient denies fever, rash, sore throat,   nausea, vomiting, diarrhea, cough, shortness of breath or chest pain, joint or back pain, headache, or mood change.   Objective:   No results found. No results for input(s): WBC, HGB, HCT, PLT in the last 72 hours.  No results for input(s): NA, K, CL, CO2, GLUCOSE, BUN, CREATININE, CALCIUM in the last 72 hours.   Intake/Output Summary (Last 24 hours) at 03/04/2021 1128 Last data filed at 03/04/2021 0214 Gross per 24 hour  Intake 300 ml  Output 725 ml  Net -425 ml        Physical Exam: Vital Signs Blood pressure 105/62, pulse 78, temperature 98.3 F (36.8 C), resp. rate 16, height 6\' 1"  (1.854 m), weight 122.2 kg, SpO2 93 %.    Constitutional: No distress . Vital signs reviewed. HEENT: NCAT, EOMI, oral membranes moist Neck: supple Cardiovascular: RRR without murmur. No JVD    Respiratory/Chest: CTA Bilaterally without wheezes or rales. Normal effort    GI/Abdomen: BS +, non-tender, non-distended Ext: no clubbing, cyanosis, or edema Psych: pleasant and cooperative  Skin: a few scattered abrasions Neuro:  Pt is alert and oriented x 3. Mild left central VII, mild left hemi-facial sensory loss. Reasonable insight, awareness and memory. Normal language, mild dysarthria. LUE 2+ deltoid, 3- biceps, tricep,   3 wrist/ HI--stable to improved. LLE: trace to 1 HF, KE , tr-0/5 distally while in bed. Left arm and leg 1/2 light touch. No resting tone.  Musculoskeletal: left heel cord is tight but can be stretched to nearly 90 today with more "give". No tenderness or pain in trunk/limbs except right shoulder with mild pain at [facility] jt with palpation and  AROM.    Assessment/Plan: 1. Functional deficits which require 3+ hours per day of interdisciplinary therapy in a comprehensive inpatient rehab setting. Physiatrist is providing close team supervision and 24 hour management of active medical problems listed below. Physiatrist and rehab team continue to assess barriers to discharge/monitor patient progress toward functional and medical goals  Care Tool:  Bathing    Body parts bathed by patient: Chest, Abdomen, Front perineal area, Face   Body parts bathed by helper: Right arm, Left arm, Buttocks, Left lower leg, Right lower leg, Left upper leg, Right upper leg     Bathing assist Assist Level: Maximal Assistance - Patient 24 - 49%     Upper Body Dressing/Undressing Upper body dressing   What is the patient wearing?: Pull over shirt    Upper body assist Assist Level: Maximal Assistance - Patient 25 - 49%    Lower Body Dressing/Undressing Lower body dressing      What is the patient wearing?: Incontinence brief     Lower body assist Assist for lower body dressing: 2 Helpers     Toileting Toileting    Toileting assist Assist for toileting: 2 Helpers     Transfers Chair/bed transfer  Transfers assist  Chair/bed transfer  activity did not occur: Safety/medical concerns  Chair/bed transfer assist level: 2 Helpers     Locomotion Ambulation   Ambulation assist   Ambulation activity did not occur: Safety/medical concerns          Walk 10 feet activity   Assist  Walk 10 feet activity did not occur: Safety/medical concerns        Walk 50 feet activity   Assist Walk 50 feet with 2 turns activity did not occur: Safety/medical concerns         Walk 150 feet activity   Assist Walk 150 feet activity did not occur: Safety/medical concerns         Walk 10 feet on uneven surface  activity   Assist Walk 10 feet on uneven surfaces activity did not occur: Safety/medical concerns          Wheelchair     Assist Is the patient using a wheelchair?: Yes Type of Wheelchair: Manual Wheelchair activity did not occur: Safety/medical concerns         Wheelchair 50 feet with 2 turns activity    Assist    Wheelchair 50 feet with 2 turns activity did not occur: Safety/medical concerns       Wheelchair 150 feet activity     Assist  Wheelchair 150 feet activity did not occur: Safety/medical concerns       Blood pressure 105/62, pulse 78, temperature 98.3 F (36.8 C), resp. rate 16, height 6\' 1"  (1.854 m), weight 122.2 kg, SpO2 93 %.  Medical Problem List and Plan: 1. 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 Interdisciplinary team conference today to discuss goals, barriers to discharge, and dc planning.    2.  Antithrombotics: -DVT/anticoagulation:  Pharmaceutical: Lovenox             -antiplatelet therapy: Aspirin 325 mg daily and Plavix 75 mg daily x3 months then aspirin alone 3. Pain Management: R shoulder pain from fall -Neurontin 300 mg 3 times daily, oxycodone as needed -ROM, strengthening of R shoulder with therapies  -PRAFO RLE---discussed stretches with patient. Might order night splint 4. Mood:   12/12 in good spirits             -antipsychotic agents: N/A 5. Neuropsych: This patient is capable of making decisions on his own behalf. 6. Skin/Wound Care: local care as needed 7. Fluids/Electrolytes/Nutrition: Routine in and out with follow-up chemistries 8.  Dysphagia.  Dysphagia #2 thin liquids.   -advance per speech therapy 9.  Hypertension.  Norvasc 10 mg daily Avapro 75 mg daily, Toprol-XL 25 mg daily  12/13- BP controlled continue regimen 10.  Diabetes mellitus.   Hemoglobin A1c 9.9.  SSI, Tradjenta 5 mg daily, Glucophage 1000 mg twice daily.   CBG (last 3)  Recent Labs    03/03/21 2131 03/04/21 0638 03/04/21 1104  GLUCAP 95 102* 121*   12/13 cbg's controlled 11.  Hyperlipidemia.  Lipitor 12.  Tobacco use.  Counseling 13.  Seizure prophylaxis.  Trileptal 300 mg 3 times daily 14. Constipation-  -had bm 12/9 - senna-s at bedtime -sorbitol prn if needed  -needs bm 12.13---sorbitol, enema if  needed 15. Pulmonary edema/hypoxia- has been using 2L O2 as needed-  -CXR 12/8 shows mild pulmonary edema -wean off O2 as tolerated 12/13 continue to encourage deep breathing, IS/FV, OOB   16. Dizziness? Vs /oxygen stable. ?anxiety component---observe with therapies.   LOS: 5 days A FACE TO FACE EVALUATION WAS PERFORMED  Ranelle Oyster 03/04/2021, 11:28 AM

## 2021-03-04 NOTE — Progress Notes (Addendum)
Soaps suds enema required. Pt did not have results. Pt also reports feeling better this evening an no longer feeling "loopy". Pt does not want to use oxycodone--pt states this makes him loopy. Agreed to utilize tylenol prn. Mylo Red, LPN

## 2021-03-04 NOTE — Progress Notes (Signed)
Physical Therapy Session Note  Patient Details  Name: Brent Young MRN: [id number] Date of Birth: Apr 01, 1960  Today's Date: 03/04/2021 PT Individual Time: 9528-4132 PT Individual Time Calculation (min): 42 min   Short Term Goals: Week 1:  PT Short Term Goal 1 (Week 1): Pt will mainain static sitting balance w/multimodal cues and cga > 5 min PT Short Term Goal 2 (Week 1): Pt will tolerate OOB in wc x 3 hrs/day for participation in rehab therapy services w/stable VS PT Short Term Goal 3 (Week 1): Pt will tolerate initiation of training w/sliding board transfers PT Short Term Goal 4 (Week 1): Pt will roll to L side using bedrails w/max assist of 1  Skilled Therapeutic Interventions/Progress Updates:     Pt received seated in tilt in space WC and agrees to therapy. No complaint of pain but does report having "bad day", thinking it may be due to medications. WC transport to gym for time management. Pt performs sliding board transfer from [facility] to mat table. PT educates on head hips relationship and pt requires modA/maxA +2 to complete. Seated at edge of mat with mirror for visual feedback, pt works on attaining midline orientation and neutral posture. Pt then performs anterior-posterior trunk leans, with exercise ball placed behind pt to provide posterior target and backstop. Pt transitions from leaning against ball to flexing at hips and trunk and WB through bilateral forearms on thighs. PT provides tactile cues for correct posture and optimal performance. Pt then performs x3 reps of sit to stand with modA/maxA +2, with tech under pt's R arm and PT providing manual blocking of R knee. In standing, pt cued to engage hip extensors and abductors, as well as maintain upright gaze to improve posture and balance. Sliding board transfer back to [facility] with maxA +2. Left seated with alarm intact and all needs within reach.  Therapy Documentation Precautions:  Precautions Precautions: Fall Precaution  Comments: BP goal 130-160 per neuro MD; Dense L side deficits Other Brace: AFO for LLE Restrictions Weight Bearing Restrictions: No   Therapy/Group: Individual Therapy  Beau Fanny, PT, DPT 03/04/2021, 4:02 PM

## 2021-03-05 LAB — GLUCOSE, CAPILLARY
Glucose-Capillary: 102 mg/dL — ABNORMAL HIGH (ref 70–99)
Glucose-Capillary: 108 mg/dL — ABNORMAL HIGH (ref 70–99)
Glucose-Capillary: 88 mg/dL (ref 70–99)
Glucose-Capillary: 133 mg/dL — ABNORMAL HIGH (ref 70–99)

## 2021-03-05 NOTE — Progress Notes (Signed)
PROGRESS NOTE   Subjective/Complaints:  "Cleaned out" from sorbitol. Happy that diet was upgraded and eating his breakfast this morning when I came in  ROS: Patient denies fever, rash, sore throat, blurred vision, nausea, vomiting, diarrhea, cough, shortness of breath or chest pain,   headache, or mood change. .   Objective:   No results found. No results for input(s): WBC, HGB, HCT, PLT in the last 72 hours.  No results for input(s): NA, K, CL, CO2, GLUCOSE, BUN, CREATININE, CALCIUM in the last 72 hours.   Intake/Output Summary (Last 24 hours) at 03/05/2021 8016 Last data filed at 03/04/2021 2233 Gross per 24 hour  Intake 100 ml  Output 940 ml  Net -840 ml        Physical Exam: Vital Signs Blood pressure (!) 145/80, pulse 90, temperature 97.8 F (36.6 C), resp. rate 18, height 6\' 1"  (1.854 m), weight 117.9 kg, SpO2 98 %.    Constitutional: No distress . Vital signs reviewed. HEENT: NCAT, EOMI, oral membranes moist Neck: supple Cardiovascular: RRR without murmur. No JVD    Respiratory/Chest: CTA Bilaterally without wheezes or rales. Normal effort    GI/Abdomen: BS +, non-tender, non-distended Ext: no clubbing, cyanosis, or edema Psych: pleasant and cooperative  Skin: a few scattered abrasions Neuro:  Pt is alert and oriented x 3. Mild left central VII, mild left hemi-facial sensory loss. Reasonable insight, awareness and memory. Normal language, mild dysarthria. LUE 2+ deltoid, 3- biceps, tricep,   3 wrist/ HI--stable to improved. LLE: trace to 1 HF, KE , tr-0/5 distally while in bed. Motor exam stable 12/14.  Left arm and leg 1/2 light touch. No resting tone.  Musculoskeletal: left heel cord can be more easily stretched to 90.  No tenderness or pain in trunk/limbs except right shoulder with mild pain at [facility] jt with palpation and AROM.    Assessment/Plan: 1. Functional deficits which require 3+ hours per day of  interdisciplinary therapy in a comprehensive inpatient rehab setting. Physiatrist is providing close team supervision and 24 hour management of active medical problems listed below. Physiatrist and rehab team continue to assess barriers to discharge/monitor patient progress toward functional and medical goals  Care Tool:  Bathing    Body parts bathed by patient: Chest, Abdomen, Front perineal area, Face   Body parts bathed by helper: Right arm, Left arm, Buttocks, Left lower leg, Right lower leg, Left upper leg, Right upper leg     Bathing assist Assist Level: Maximal Assistance - Patient 24 - 49%     Upper Body Dressing/Undressing Upper body dressing   What is the patient wearing?: Pull over shirt    Upper body assist Assist Level: Maximal Assistance - Patient 25 - 49%    Lower Body Dressing/Undressing Lower body dressing      What is the patient wearing?: Incontinence brief     Lower body assist Assist for lower body dressing: 2 Helpers     Toileting Toileting    Toileting assist Assist for toileting: 2 Helpers     Transfers Chair/bed transfer  Transfers assist  Chair/bed transfer activity did not occur: Safety/medical concerns  Chair/bed transfer assist level: 2 Helpers (MAX  A +2 SB txfr)     Locomotion Ambulation   Ambulation assist   Ambulation activity did not occur: Safety/medical concerns          Walk 10 feet activity   Assist  Walk 10 feet activity did not occur: Safety/medical concerns        Walk 50 feet activity   Assist Walk 50 feet with 2 turns activity did not occur: Safety/medical concerns         Walk 150 feet activity   Assist Walk 150 feet activity did not occur: Safety/medical concerns         Walk 10 feet on uneven surface  activity   Assist Walk 10 feet on uneven surfaces activity did not occur: Safety/medical concerns         Wheelchair     Assist Is the patient using a wheelchair?: Yes Type  of Wheelchair: Manual Wheelchair activity did not occur: Safety/medical concerns         Wheelchair 50 feet with 2 turns activity    Assist    Wheelchair 50 feet with 2 turns activity did not occur: Safety/medical concerns       Wheelchair 150 feet activity     Assist  Wheelchair 150 feet activity did not occur: Safety/medical concerns       Blood pressure (!) 145/80, pulse 90, temperature 97.8 F (36.6 C), resp. rate 18, height 6\' 1"  (1.854 m), weight 117.9 kg, SpO2 98 %.  Medical Problem List and Plan: 1. 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  Antithrombotics: -DVT/anticoagulation:  Pharmaceutical: Lovenox             -antiplatelet therapy: Aspirin 325 mg daily and Plavix 75 mg daily x3 months then aspirin alone 3. Pain Management: R shoulder pain from fall -Neurontin 300 mg 3 times daily, dc oxycodone d/t "loopiness" -ROM, strengthening of R shoulder with therapies  -PRAFO RLE---discussed stretches with patient. Might order night splint 4. Mood:   12/14 in good spirits             -antipsychotic agents: N/A 5. Neuropsych: This patient is capable of making decisions on his own behalf. 6. Skin/Wound Care: local care as needed 7. Fluids/Electrolytes/Nutrition: Routine in and out with follow-up chemistries 8.  Dysphagia.  Dysphagia #2 thin liquids.   -advance per speech therapy 9.  Hypertension.  Norvasc 10 mg daily Avapro 75 mg daily, Toprol-XL 25 mg daily  12/13- BP controlled continue regimen 10.  Diabetes mellitus.  Hemoglobin A1c 9.9.  SSI, Tradjenta 5 mg daily, Glucophage 1000 mg twice daily.   CBG (last 3)  Recent Labs    03/04/21 1638 03/04/21 2130 03/05/21 0633  GLUCAP 93 124*  102*   12/14  cbg's well controlled 11.  Hyperlipidemia.  Lipitor 12.  Tobacco use.  Counseling 13.  Seizure prophylaxis.  Trileptal 300 mg 3 times daily 14. Constipation-  -large bm with sorbitol -senna-s at HS to stay on schedule 15. Pulmonary edema/hypoxia- has been using 2L O2 as needed-  -CXR 12/8 shows mild pulmonary edema -wean off O2 as tolerated 12/14 continue to encourage  deep breathing, IS/FV, OOB   16. Dizziness? Vs /oxygen stable. ?anxiety component---improved last night.  -might be related to oxycodone. Will dc.   LOS: 6 days A FACE TO FACE EVALUATION WAS PERFORMED  Ranelle Oyster 03/05/2021, 8:22 AM

## 2021-03-05 NOTE — Progress Notes (Signed)
Physical Therapy Session Note  Patient Details  Name: Brent Young MRN: [id number] Date of Birth: 29-Oct-1960  Today's Date: 03/05/2021 PT Individual Time: 0953-1050 PT Individual Time Calculation (min): 57 min   Short Term Goals: Week 1:  PT Short Term Goal 1 (Week 1): Pt will mainain static sitting balance w/multimodal cues and cga > 5 min PT Short Term Goal 2 (Week 1): Pt will tolerate OOB in wc x 3 hrs/day for participation in rehab therapy services w/stable VS PT Short Term Goal 3 (Week 1): Pt will tolerate initiation of training w/sliding board transfers PT Short Term Goal 4 (Week 1): Pt will roll to L side using bedrails w/max assist of 1  Skilled Therapeutic Interventions/Progress Updates: Pt presented in TIS with with friend present agreeable to therapy. Pt c/o pain in L shoulder 6/10 premedicated, rest breaks and repositioning provided as needed. Pt transported to rehab gym and performed Slide board transfer to mat with maxA x 2. Pt required cues for head/hips relationship, pushing through BLE, and increasing anterior lean. Session focused on sitting balance with and without mirror feedback and forced use of LUE. Pt initially sitting in front of mirror working on attaining midline. Pt noted to have increased L posterior lateral lean but able to correct with minA. Pt was able maintain midline for short bouts with CGA before demonstrating increased L lateral lean. Pt also participated in use of RUE to reach and place cards to R for elongation of L trunk. Pt able to tolerate reaches with modA, requiring cues for return for midline. Pt also participated in forward reaching with [facility] assist for LUE to tap and grab cones. Pt required modA overall for controlled forward lean and returning to midline with pt able to activate core in smaller range. Pt returned to TIS max A x 2 with max multimodal cues and PTA facilitating anterior lean. Pt noted to distress nor SOB with activity with SpO2 noted  >90% during session. Pt returned to room and agreeable to remain in TIS. Pt left in TIS with belt alarm on, call bell within reach and needs met.      Therapy Documentation Precautions:  Precautions Precautions: Fall Precaution Comments: BP goal 130-160 per neuro MD; Dense L side deficits Other Brace: AFO for LLE Restrictions Weight Bearing Restrictions: No General:   Vital Signs: Therapy Vitals Temp: 97.7 F (36.5 C) Temp Source: Oral Pulse Rate: 82 Resp: 15 BP: 130/74 Patient Position (if appropriate): Sitting Oxygen Therapy SpO2: 94 % O2 Device: Nasal Cannula O2 Flow Rate (L/min): 2 L/min Pain:   Mobility:   Locomotion :    Trunk/Postural Assessment :    Balance:   Exercises:   Other Treatments:      Therapy/Group: Individual Therapy  Abbegail Matuska 03/05/2021, 4:11 PM

## 2021-03-05 NOTE — Progress Notes (Signed)
Occupational Therapy Session Note  Patient Details  Name: Brent Young MRN: [id number] Date of Birth: [birth year]  Today's Date: 03/05/2021 OT Individual Time: 7564-3329 OT Individual Time Calculation (min): 58 min   OT Individual Time: 1404-1430 OT Individual Time Calculation (min): 26 min   Short Term Goals: Week 1:  OT Short Term Goal 1 (Week 1): Pt will be able to roll onto his R side with min A to A caregivers. OT Short Term Goal 2 (Week 1): Pt will be able to sit to EOB with mod A of 1. OT Short Term Goal 3 (Week 1): Pt will sit at EOB with min A to wash UB. OT Short Term Goal 4 (Week 1): Pt will be able to wash L arm with min A by actively lifting it partially away from his body. OT Short Term Goal 5 (Week 1): Pt will be able to rise to stand with max A of 2 to prepare for toileting.  Skilled Therapeutic Interventions/Progress Updates:  Session 1: Patient met lying supine in bed in agreement with OT treatment session. 0/10 pain reported at rest and with activity. Total A to don footwear in supine. Patient able to lift RLE in prep for threading through pants but required external assist to lift RLE. +2 assist for hiking pants over hips in supine with rolling to R<>L. Supine to EOB with +2 assist at BLE and trunk with HOB elevated. Patient able to maintain static sitting balance at EOB with Min A to Min guard. Patient easily distracted requiring frequent redirection. Focus shifted to static and dynamic sitting balance at EOB in prep for functional transfers. After requiring external assist for positioning LLE, heavy Max A +2, max mutlimodal cues and multiple attempts required for sit to stand in stedy. Patient with good attention to LUE assisting it to bar of stedy with RUE. Seated in TIS wc at sink level, patient able to doff soiled shirt with Min A and don new shirt with cues for hemi technique, Max-Mod A and increased time/effort. Oral hygiene seated at sink level with hand over hand  assist for incorporation of LUE at gross assist level. Total A for wc transport to and from dayroom in prep for therapeutic activity. Blocked practice for sit to stand transfers in stedy with +2 assist, max mutlimodal cues and mirror for visual feedback. Patient able to stand x1 attempt from wc and x1 attempt from perched position in stedy. Unable to come fully upright on all further attempts 2/2 fatigue. Session concluded with patient seated in wc with call bell within reach, belt alarm activated and all needs met.    Session 2: Patient met asleep in supine but easily awoken although lethargic throughout short session. Patient in agreement with OT treatment session. 0/10 pain reported at rest and with activity. No tech available to session with focus on bed level LUE NMR. Patient falling asleep several times during session. AAROM in all planes of motion. Patient with activation of shoulder flexors up to 30 degrees before requiring external assist. Unable to maintain position of LUE in space against gravity. Focus shifted to gross grasp in prep for ADLs. Patient able to make a fist and completed 2 sets x20 reps of squeezes on yellow foam block. Instructed patient to count out loud with patient again falling asleep several times. Session terminated 4 minutes early secondary to fatigue/lethargy with patient lying supine in bed, call bell within reach, bed alarm activated and all needs met.    Therapy  Documentation Precautions:  Precautions Precautions: Fall Precaution Comments: BP goal 130-160 per neuro MD; Dense L side deficits Other Brace: AFO for LLE Restrictions Weight Bearing Restrictions: No General:    Therapy/Group: Individual Therapy  Felicia Both R Howerton-Davis 03/05/2021, 6:46 AM

## 2021-03-05 NOTE — Progress Notes (Signed)
Speech Language Pathology Daily Session Note  Patient Details  Name: Brent Young MRN: [id number] Date of Birth: [birth year]  Today's Date: 03/05/2021 SLP Individual Time: 0725-0805 SLP Individual Time Calculation (min): 40 min  Short Term Goals: Week 1: SLP Short Term Goal 1 (Week 1): Patient will consume current diet with minimal overt s/s of aspiration with supervision level verbal cues for use of swallowing compensatory strategies. SLP Short Term Goal 2 (Week 1): Patient will demonstrate efficient mastication with complete oral clearance without overt s/s of aspiration over 2 sessions prior to upgrade with supervision level verbal cues. SLP Short Term Goal 3 (Week 1): Patient will perform pharyngeal strengthening exercises with supervision level verbal cues for accuracy.  Skilled Therapeutic Interventions: Skilled treatment session focused on dysphagia goals. SLP facilitated session by providing skilled observation with breakfast meal of Dys. 3 textures with thin liquids. Patient consumed meal without overt s/s of aspiration and required intermittent supervision level verbal cues for use of swallowing compensatory strategies. Recommend patient continue current diet. Patient left upright in bed with alarm on and all needs within reach. Continue with current plan of care.      Pain No/Denies Pain   Therapy/Group: Individual Therapy  Senita Corredor 03/05/2021, 3:51 PM

## 2021-03-05 NOTE — Patient Care Conference (Signed)
Inpatient RehabilitationTeam Conference and Plan of Care Update Date: 03/04/2021   Time: 10:49 AM    Patient Name: Brent Young      Medical Record Number: [id number]  Date of Birth: 07-22-1960 Sex: Male         Room/Bed: 4W11C/4W11C-01 Payor Info: Payor: MEDICAID [place] / Plan: MEDICAID [place]-FAMILY PLANNING / Product Type: *No Product type* /    Admit Date/Time:  02/27/2021  3:04 PM  Primary Diagnosis:  Infarction of right basal ganglia [facility]  Hospital Problems: Principal Problem:   Infarction of right basal ganglia [facility])    Expected Discharge Date: Expected Discharge Date: 03/28/21  Team Members Present: Physician leading conference: Dr. Faith Rogue Social Worker Present: [name], LCSWA Nurse Present: Kennyth Arnold, RN PT Present: Malachi Pro, PT OT Present: Kearney Hard, OT SLP Present: Feliberto Gottron, SLP PPS Coordinator present : Fae Pippin, SLP     Current Status/Progress Goal Weekly Team Focus  Bowel/Bladder   cont/ incont of B/B  regain continence  toilet q2 hours and prn   Swallow/Nutrition/ Hydration   Dys. 2 textures with thin liquids, Mod I-Intermittent supervision  Mod I  trials of upgraded textures, use of swallowing compensatory strategies, tolerance of thin liquids   ADL's   Total A+2 lower body ADLs, Max A UB ADLs, Max/total A +2 transfers, trialing stedy and slideboard. L lateral lean, poor trunk control, slight pusher to L  Minimal assistance overall, moderate assistance for tub shower transfer  L UR NMR, sit<>stands, transfers, self-care retraining, activity tolerance   Mobility   max to totalA all mobility, sliding board transfers, no ambulation at this time  minA, probably WC level  bed mobility, balance, transfers, NMR for L hemibody,   Communication             Safety/Cognition/ Behavioral Observations            Pain   complains of 8/10 pain  pain <3  aseess pain q 4 hrs and prn   Skin   CDI  no new breakdown  assess skin q  shift and prn     Discharge Planning:  D/c to home with his son [name] being primary caregiver.   Team Discussion: Adjusting medications. Son to be primary caregiver. Trying to wean off oxygen. Incontinent episodes. Medicating pain appropriately. Max +2 with lateral lean to left. 2L, O2 during therapy sessions currently. Max/total assist with all mobility. Has poor trunk control. Penetrates thin liquids at baseline. Working on tolerance.  Patient on target to meet rehab goals: Min assist goals at [facility] level.   *See Care Plan and progress notes for long and short-term goals.   Revisions to Treatment Plan:  Adjusting medications  Teaching Needs: Family education, medication/pain management, safety awareness, transfer training, etc.  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 right shoulder injury. pulmonary edema on oxygen---wean  Barriers to Discharge: Medical stability   Possible Resolutions to [employer] Focus: daily assessment of labs and patient data.   Continued Need for Acute Rehabilitation Level of Care: The patient requires daily medical management by a physician with specialized training in physical medicine and rehabilitation for the following reasons: Direction of a multidisciplinary physical rehabilitation program to maximize functional independence : Yes Medical management of patient stability for  increased activity during participation in an intensive rehabilitation regime.: Yes Analysis of laboratory values and/or radiology reports with any subsequent need for medication adjustment and/or medical intervention. : Yes   I attest that I was present, lead the team  conference, and concur with the assessment and plan of the team.   Kennyth Arnold G 03/05/2021, 11:08 AM

## 2021-03-06 LAB — CREATININE, SERUM
Creatinine, Ser: 0.8 mg/dL (ref 0.61–1.24)
GFR, Estimated: >60 mL/min (ref >60)

## 2021-03-06 LAB — GLUCOSE, CAPILLARY
Glucose-Capillary: 85 mg/dL (ref 70–99)
Glucose-Capillary: 82 mg/dL (ref 70–99)
Glucose-Capillary: 88 mg/dL (ref 70–99)
Glucose-Capillary: 102 mg/dL — ABNORMAL HIGH (ref 70–99)

## 2021-03-06 NOTE — Plan of Care (Signed)
°  Problem: Consults Goal: RH STROKE PATIENT EDUCATION Description: See Patient Education module for education specifics  Outcome: Progressing Goal: Diabetes Guidelines if Diabetic/Glucose > 140 Description: If diabetic or lab glucose is > 140 mg/dl - Initiate Diabetes/Hyperglycemia Guidelines & Document Interventions  Outcome: Progressing   Problem: RH SKIN INTEGRITY Goal: RH STG MAINTAIN SKIN INTEGRITY WITH ASSISTANCE Description: STG Maintain Skin Integrity With Min Assistance. Outcome: Progressing Goal: RH STG ABLE TO PERFORM INCISION/WOUND CARE W/ASSISTANCE Description: STG Able To Perform Incision/Wound Care With [employer]. Outcome: Progressing   Problem: RH SAFETY Goal: RH STG ADHERE TO SAFETY PRECAUTIONS W/ASSISTANCE/DEVICE Description: STG Adhere to Safety Precautions With Cues and Reminders. Outcome: Progressing   Problem: RH COGNITION-NURSING Goal: RH STG ANTICIPATES NEEDS/CALLS FOR ASSIST W/ASSIST/CUES Description: STG Anticipates Needs/Calls for Assist With Cues and Reminders. Outcome: Progressing   Problem: RH KNOWLEDGE DEFICIT Goal: RH STG INCREASE KNOWLEDGE OF DIABETES Description: Patient will demonstrate knowledge of diabetes medication management, insulin management, and dietary recommendations with educational materials and handouts provided by staff independently at discharge. Outcome: Progressing Goal: RH STG INCREASE KNOWLEDGE OF HYPERTENSION Description: Patient will demonstrate knowledge of HTN medications, BP management, and dietary recommendations with educational materials and handouts provided by staff independently at discharge.  Outcome: Progressing Goal: RH STG INCREASE KNOWLEGDE OF HYPERLIPIDEMIA Description: Patient will demonstrate knowledge of cholesterol medication, dietary recommendations, and lab values with educational materials and handouts provided by staff independently at discharge.  Outcome: Progressing Goal: RH STG INCREASE  KNOWLEDGE OF STROKE PROPHYLAXIS Description: Patient will demonstrate knowledge of medications used to treat and prevent future strokes with educational materials and handouts provided by staff independently at discharge. Outcome: Progressing

## 2021-03-06 NOTE — Progress Notes (Signed)
Occupational Therapy Weekly Progress Note  Patient Details  Name: Brent Young MRN: [id number] Date of Birth: [birth year]  Beginning of progress report period: February 28, 2021 End of progress report period: March 07, 2021  Today's Date: 03/07/2021 OT Individual Time: 3244-0102 OT Individual Time Calculation (min): 69 min    Patient has met 2 of 5 short term goals.  Patient is making slow, but steady progress towards OT goals. Pt is currently requiring max A +2 for slideboard transfers. We are working on sit<>stands using Stedy. 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 L UE function is improving but pt still has attention deficits to L side and needs cues to integrate L UE functionally. Continue current POC.  Patient continues to demonstrate the following deficits: muscle weakness, decreased cardiorespiratoy endurance, impaired timing and sequencing, abnormal tone, unbalanced muscle activation, motor apraxia, ataxia, decreased coordination, and decreased motor planning, decreased midline orientation, decreased attention to left, and decreased motor planning, decreased initiation, decreased attention, decreased awareness, decreased problem solving, decreased safety awareness, decreased memory, and delayed processing, and decreased sitting balance, decreased standing balance, decreased postural control, hemiplegia, and decreased balance strategies and therefore will continue to benefit from skilled OT intervention to enhance overall performance with BADL and Reduce care partner burden.  Patient progressing toward long term goals..  Continue plan of care.  OT Short Term Goals Week 1:  OT Short Term Goal 1 (Week 1): Pt will be able to roll onto his R side with min A to A caregivers. OT Short Term Goal 1 - Progress (Week 1): Progressing toward goal OT Short Term Goal 2 (Week 1): Pt will be able to sit to EOB with mod A of 1. OT Short Term Goal 2 - Progress (Week 1):  Progressing toward goal OT Short Term Goal 3 (Week 1): Pt will sit at EOB with min A to wash UB. OT Short Term Goal 3 - Progress (Week 1): Met OT Short Term Goal 4 (Week 1): Pt will be able to wash L arm with min A by actively lifting it partially away from his body. OT Short Term Goal 4 - Progress (Week 1): Met OT Short Term Goal 5 (Week 1): Pt will be able to rise to stand with max A of 2 to prepare for toileting. OT Short Term Goal 5 - Progress (Week 1): Progressing toward goal Week 2:  OT Short Term Goal 1 (Week 2): Pt will be able to roll onto his R side with min A to A caregivers. OT Short Term Goal 2 (Week 2): Pt will be able to sit to EOB with mod A of 1. OT Short Term Goal 3 (Week 2): Pt will be able to rise to stand with max A of 2 to prepare for toileting.  Skilled Therapeutic Interventions/Progress Updates:    Pt greeted semi-reclined in bed and agreeable to OT treatment session. Pt on 2L of O2 with SpO2 at 97%. Bed mobility completed with max A to advance LLE and max  A to elevate trunk. Worked on sitting balance at EOB with pt able to achieve with min A, progressing to close supervision. Attempted sit<>stand with 1 person assist in [place], but pt unable to power up. Bed raised and nurse tech entered to provide +2 assist. We were able to get pt into standing with max +2 and hip facilitation to promote full hip and trunk extension. Pt with lateral lean to the L requiring OT on L side then  entire time while moving stedy. Total A for LB dressing with max+2 to stand again and pull up pants. Blocked practice for UB bathing/dressing at the sink. Min/mod A to integrate L UE into bathing tasks for neuro re-ed. OT donned TED hose for edema management and educated on friction reducing device. MD entered room and discussed weaning pt from O2. SpO2 at rest 92% on room air, but able to bring up to 97% quickly with deep breathing exercises. SpO2 88% afer 5 minutes on room air, but able to bring up to 96%  within 30 seconds of deep breathing.  OT left patient on room and and educated on continuing deep breaths and asked nurse tech to check O2 intermittently. Pt left seated in TIS wc with alarm belt on, call bell in reach, and needs met.   Therapy Documentation Precautions:  Precautions Precautions: Fall Precaution Comments: BP goal 130-160 per neuro MD; Dense L side deficits Other Brace: AFO for LLE Restrictions Weight Bearing Restrictions: No Pain: Pain Assessment Pain Scale: 0-10 Pain Score: Asleep   Therapy/Group: Individual Therapy  Valma Cava 03/06/2021, 10:39 PM

## 2021-03-06 NOTE — Progress Notes (Signed)
PROGRESS NOTE   Subjective/Complaints:  Up in gym with PT. No new issues this morning  ROS: Patient denies fever, rash, sore throat, blurred vision, nausea, vomiting, diarrhea, cough, shortness of breath or chest pain, joint or back pain, headache, or mood change.   Objective:   No results found. No results for input(s): WBC, HGB, HCT, PLT in the last 72 hours.  Recent Labs    03/06/21 0515  CREATININE 0.80     Intake/Output Summary (Last 24 hours) at 03/06/2021 0955 Last data filed at 03/06/2021 0716 Gross per 24 hour  Intake 1012 ml  Output 1100 ml  Net -88 ml        Physical Exam: Vital Signs Blood pressure (!) 145/74, pulse 91, temperature (!) 97.1 F (36.2 C), resp. rate 19, height 6\' 1"  (1.854 m), weight 117.9 kg, SpO2 94 %.    Constitutional: No distress . Vital signs reviewed. HEENT: NCAT, EOMI, oral membranes moist Neck: supple Cardiovascular: RRR without murmur. No JVD    Respiratory/Chest: CTA Bilaterally without wheezes or rales. Normal effort    GI/Abdomen: BS +, non-tender, non-distended Ext: no clubbing, cyanosis, or edema Psych: pleasant and cooperative  Skin: a few scattered abrasions Neuro:  Pt is alert and oriented x 3. Mild left central VII, mild left hemi-facial sensory loss. Reasonable insight, awareness and memory. Normal language, mild dysarthria. LUE 2+ deltoid, 3- biceps, tricep,   3 wrist/ HI--stable to improved. LLE: trace to 1 HF, KE--inconsistent , 0/5 distally at eot.  Left arm and leg 1/2 light touch. No resting tone.  Musculoskeletal: left heel cord still tight but better   Assessment/Plan: 1. Functional deficits which require 3+ hours per day of interdisciplinary therapy in a comprehensive inpatient rehab setting. Physiatrist is providing close team supervision and 24 hour management of active medical problems listed below. Physiatrist and rehab team continue to assess  barriers to discharge/monitor patient progress toward functional and medical goals  Care Tool:  Bathing    Body parts bathed by patient: Chest, Abdomen, Front perineal area, Face   Body parts bathed by helper: Right arm, Left arm, Buttocks, Left lower leg, Right lower leg, Left upper leg, Right upper leg     Bathing assist Assist Level: Maximal Assistance - Patient 24 - 49%     Upper Body Dressing/Undressing Upper body dressing   What is the patient wearing?: Pull over shirt    Upper body assist Assist Level: Maximal Assistance - Patient 25 - 49%    Lower Body Dressing/Undressing Lower body dressing      What is the patient wearing?: Pants     Lower body assist Assist for lower body dressing: 2 Helpers     Toileting Toileting    Toileting assist Assist for toileting: 2 Helpers     Transfers Chair/bed transfer  Transfers assist  Chair/bed transfer activity did not occur: Safety/medical concerns  Chair/bed transfer assist level: 2 Helpers (MAX A +2 SB txfr)     Locomotion Ambulation   Ambulation assist   Ambulation activity did not occur: Safety/medical concerns          Walk 10 feet activity   Assist  Walk 10  feet activity did not occur: Safety/medical concerns        Walk 50 feet activity   Assist Walk 50 feet with 2 turns activity did not occur: Safety/medical concerns         Walk 150 feet activity   Assist Walk 150 feet activity did not occur: Safety/medical concerns         Walk 10 feet on uneven surface  activity   Assist Walk 10 feet on uneven surfaces activity did not occur: Safety/medical concerns         Wheelchair     Assist Is the patient using a wheelchair?: Yes Type of Wheelchair: Manual Wheelchair activity did not occur: Safety/medical concerns         Wheelchair 50 feet with 2 turns activity    Assist    Wheelchair 50 feet with 2 turns activity did not occur: Safety/medical concerns        Wheelchair 150 feet activity     Assist  Wheelchair 150 feet activity did not occur: Safety/medical concerns       Blood pressure (!) 145/74, pulse 91, temperature (!) 97.1 F (36.2 C), resp. rate 19, height 6\' 1"  (1.854 m), weight 117.9 kg, SpO2 94 %.  Medical Problem List and Plan: 1. 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  Antithrombotics: -DVT/anticoagulation:  Pharmaceutical: Lovenox             -antiplatelet therapy: Aspirin 325 mg daily and Plavix 75 mg daily x3 months then aspirin alone 3. Pain Management: R shoulder pain from fall -Neurontin 300 mg 3 times daily, dc oxycodone d/t "loopiness" -ROM, strengthening of R shoulder with therapies  -PRAFO RLE---discussed stretches with patient. Might order night splint 4. Mood:   12/15 doing well!             -antipsychotic agents: N/A 5. Neuropsych: This patient is capable of making decisions on his own behalf. 6. Skin/Wound Care: local care as needed 7. Fluids/Electrolytes/Nutrition: Routine in and out with follow-up chemistries 8.  Dysphagia.  Dysphagia #2 thin liquids.   -advance per speech therapy 9.  Hypertension.  Norvasc 10 mg daily Avapro 75 mg daily, Toprol-XL 25 mg daily  12/13- BP controlled continue regimen 10.  Diabetes mellitus.  Hemoglobin A1c 9.9.  SSI, Tradjenta 5 mg daily, Glucophage 1000 mg twice daily.   CBG (last 3)  Recent Labs    03/05/21 1650 03/05/21 2141 03/06/21 0614  GLUCAP 88 133* 85   12/15  cbg's well controlled 11.  Hyperlipidemia.  Lipitor 12.  Tobacco use.  Counseling 13.  Seizure prophylaxis.  Trileptal 300 mg 3 times daily 14. Constipation-   -large bm with sorbitol -continue senna-s at HS as mtc 15. Pulmonary edema/hypoxia- has been using 2L O2 as needed-  -CXR 12/8 shows mild pulmonary edema -still trying to wean off O2  16. Dizziness? Likely d/t oxycodone which was dc'ed  -no further complaints so far  LOS: 7 days A FACE TO FACE EVALUATION WAS PERFORMED  14/8 03/06/2021, 9:55 AM

## 2021-03-06 NOTE — Progress Notes (Signed)
Physical Therapy Weekly Progress Note  Patient Details  Name: Brent Young MRN: [id number] Date of Birth: 1960-09-09  Beginning of progress report period: February 28, 2021 End of progress report period: March 06, 2021  Today's Date: 03/06/2021 PT Individual Time: 0802-0914 PT Individual Time Calculation (min): 72 min   Patient has met 3 of 4 short term goals.  Pt is progressing toward mobility goals, improving independence with bed mobility, endurance and ability to participate in therapeutic activities out of bed, and sitting balance. Pt continues to have tendency to push to the L but is able to maintain static sitting balance with CGA/minA, with mirror utilized for visual feedback. Pt transfers with sliding board requiring +2 for safety and is able to perform sit to stand with max A +2. Functional ambulation not attempted due to severity of deficits at this time.   Patient continues to demonstrate the following deficits muscle weakness, decreased cardiorespiratoy endurance and decreased oxygen support, unbalanced muscle activation and decreased motor planning, decreased attention to left, and decreased sitting balance, decreased standing balance, decreased postural control, hemiplegia, and decreased balance strategies and therefore will continue to benefit from skilled PT intervention to increase functional independence with mobility.  Patient progressing toward long term goals..  Continue plan of care.  PT Short Term Goals Week 1:  PT Short Term Goal 1 (Week 1): Pt will mainain static sitting balance w/multimodal cues and cga > 5 min PT Short Term Goal 1 - Progress (Week 1): Progressing toward goal PT Short Term Goal 2 (Week 1): Pt will tolerate OOB in wc x 3 hrs/day for participation in rehab therapy services w/stable VS PT Short Term Goal 2 - Progress (Week 1): Met PT Short Term Goal 3 (Week 1): Pt will tolerate initiation of training w/sliding board transfers PT Short Term Goal 3  - Progress (Week 1): Met PT Short Term Goal 4 (Week 1): Pt will roll to L side using bedrails w/max assist of 1 PT Short Term Goal 4 - Progress (Week 1): Met Week 2:  PT Short Term Goal 1 (Week 2): Pt will maintain static sitting balance with CGA and cues for >5 minutes. PT Short Term Goal 2 (Week 2): Pt will perform sit to stand transfer with modA +2. PT Short Term Goal 3 (Week 2): Pt will perform sliding board transfer with modA +1.  Skilled Therapeutic Interventions/Progress Updates:  Ambulation/gait training;Community reintegration;DME/adaptive equipment instruction;Neuromuscular re-education;Psychosocial support;Stair training;UE/LE Strength taining/ROM;Wheelchair propulsion/positioning;Balance/vestibular training;Discharge planning;Functional electrical stimulation;Pain management;Therapeutic Activities;UE/LE Coordination activities;Disease management/prevention;Functional mobility training;Patient/family education;Splinting/orthotics;Therapeutic Exercise   Pt received supine in bed and agrees to therapy. No complaint of pain. Supine to sit with maxA +1. Pt sits at edge of bed with minA +1 due to tendency to push to the L. Sliding board transfer from bed to [facility] with maxA +2 and cues for head hips relationship and sequencing. WC transport to gym for time management. Sliding board transfer to mat with maxA +2 and same cueing. Pt works on sitting balance at edge of mat with mirror for visual feedback. Pt assisted into R propped side leaning on R forearm and elbow, then tasked with returning to upright posture. Pt requires modA with manual facilitation of L lateral trunk expansion for NM feedback, as well as manual pressure on pelvis to facilitate optimal body mechanics. Pt performs x5 reps. Pt performs x5 reps of anterior posterior trunk leans with minA required and max cueing for activation of core to complete. Pt performs x3 L sidelying to upright  sitting transitions with maxA +1. Pt attempts sit to  stand with Stedy but is unable to complete, despite maxA +2 provided. Stedy removed and pt then performs sit to stand with maxA +2 HHA, with heavy manual facilitation of hip extension, blocking of L knee, and max multimodal cueing for posture. Pt able to maintain standing ~1 minute at time. Pt performs multiple bouts of standing with PT attempting to engage recruitment of L lower extremity extensor groups. Pt appears to have trace activation with max cueing. Pt performs 2-3 reps of sit to stand with RW and L hand splint, providing NM feedback in L upper extremity via approximation and WB, requiring maxA +2 for standing balance.   Sliding board transfer back to [facility] with max A+2. Left seated with alarm intact and all needs within reach.  Therapy Documentation Precautions:  Precautions Precautions: Fall Precaution Comments: BP goal 130-160 per neuro MD; Dense L side deficits Other Brace: AFO for LLE Restrictions Weight Bearing Restrictions: No   Therapy/Group: Individual Therapy  Breck Coons, PT, DPT 03/06/2021, 11:40 AM

## 2021-03-06 NOTE — Progress Notes (Signed)
Occupational Therapy Session Note  Patient Details  Name: Brent Young MRN: [id number] Date of Birth: [birth year]  Today's Date: 03/06/2021 Session 1 OT Individual Time: 1002-1100 OT Individual Time Calculation (min): 58 min   Session 2 OT Individual Time: 1358-1455 OT Individual Time Calculation (min): 57 min    Short Term Goals: Week 1:  OT Short Term Goal 1 (Week 1): Pt will be able to roll onto his R side with min A to A caregivers. OT Short Term Goal 2 (Week 1): Pt will be able to sit to EOB with mod A of 1. OT Short Term Goal 3 (Week 1): Pt will sit at EOB with min A to wash UB. OT Short Term Goal 4 (Week 1): Pt will be able to wash L arm with min A by actively lifting it partially away from his body. OT Short Term Goal 5 (Week 1): Pt will be able to rise to stand with max A of 2 to prepare for toileting.  Skilled Therapeutic Interventions/Progress Updates:    Session 1 Pt greeted seated in TIS wc with friend Ronalee Belts present and agreeable to OT treatment session. Pt brought to the sink and was educated on use of L hand to stabilize toothbrush while applying toothpaste. Pt able to achieve enough grasp on toothbrush to start bringing towards mouth, but then had to switch hands to brush teeth functionally. Pt washed face with set-up A as well. Pt declined further bathing and dressing tasks today, but requests to do BADLs tomorrow. Pt brought to therapy gym and worked on [place]. Utilized medium sized ball trying to roll ball with wrsit flex/ext. Then progressed to pushing down pegs in multiple quadrants accrross table. Large peg board task with OT facilitation at elbow/shoulder to achieve more normal movement patterns. Pt returned to room and left seated in wc with alarm belt on, call bell in reach, and needs met.  No pain  Session 2 Pt greeted semi-reclined in bed on 2L of O2. Pt reported fatigue and requested to stay in the bed. OT brought pt into gravity eliminated sidelying  position with max A onto L side. L UE NMR with weight bearing through wrist and elbow to bring pt through full elbow and shoulder ROM. Pt with increased activation with repetition. Pt returned back to supine and bed placed in [place] to scoot up in bed. OT supported L knee into flexion and worked on hip bridging in bed. OT then put bed in chair position and issued soft tan theraputty. Addressed L hand strength and coordination with putty focus on lateral pinch, weight bearing, and rolling putty. Pt brought back into supine and unweighted bar placed in both hands. Pt able to maintain grasp with L hand and completed 3 sets of 10 chest press. Pt left semi-reclined in bed at end of session with bed alarm on, call bell in reach, and needs met.   Therapy Documentation Precautions:  Precautions Precautions: Fall Precaution Comments: BP goal 130-160 per neuro MD; Dense L side deficits Other Brace: AFO for LLE Restrictions Weight Bearing Restrictions: No Pain:  Denies pain   Therapy/Group: Individual Therapy  Valma Cava 03/06/2021, 2:55 PM

## 2021-03-07 LAB — GLUCOSE, CAPILLARY
Glucose-Capillary: 107 mg/dL — ABNORMAL HIGH (ref 70–99)
Glucose-Capillary: 131 mg/dL — ABNORMAL HIGH (ref 70–99)
Glucose-Capillary: 114 mg/dL — ABNORMAL HIGH (ref 70–99)
Glucose-Capillary: 104 mg/dL — ABNORMAL HIGH (ref 70–99)

## 2021-03-07 NOTE — Progress Notes (Signed)
Physical Therapy Session Note  Patient Details  Name: Brent Young MRN: [id number] Date of Birth: [birth year]  Today's Date: 03/07/2021 PT Individual Time: 1100-1200 PT Individual Time Calculation (min): 60 min   Short Term Goals: Week 1:  PT Short Term Goal 1 (Week 1): Pt will mainain static sitting balance w/multimodal cues and cga > 5 min PT Short Term Goal 1 - Progress (Week 1): Progressing toward goal PT Short Term Goal 2 (Week 1): Pt will tolerate OOB in wc x 3 hrs/day for participation in rehab therapy services w/stable VS PT Short Term Goal 2 - Progress (Week 1): Met PT Short Term Goal 3 (Week 1): Pt will tolerate initiation of training w/sliding board transfers PT Short Term Goal 3 - Progress (Week 1): Met PT Short Term Goal 4 (Week 1): Pt will roll to L side using bedrails w/max assist of 1 PT Short Term Goal 4 - Progress (Week 1): Met  Skilled Therapeutic Interventions/Progress Updates: Pt presents sitting in w/c and agreeable to therapy.  Pt states that he got puddy on pants and agreeable to changing them.  PT attempted to stand w/ NT assist on Stedy and was able to stand for Stedy perch to be placed.  Pt unable to scoot back or stand w/o assist of 2.  Pt lists to left but pt placing L hand on Stedy bar and attempting use.  Pt feet sliding back off foot rests and unable to attempt standing.  Stedy positioned at side of bed and PT and NT able to remove Stedy perch and transfer pt to EOB.  Pt required total A for sit to supine.  O2 checked at 2 LPM at 96%, at 1 LPM at 94%.  Pt performed multiple roll side to side w/ min A to Left as pt able to flex R knee and reach across midline for siderail, Pt requires total A to right, although pt maintains LUE across chest, and PT flexes L knee and maintains.  Pt able to pull self to [facility] using headboard and R knee, w/ Trendelenburg bed position.  Pt remained in bed and bed alarm on, w/ all needs in reach.     Therapy  Documentation Precautions:  Precautions Precautions: Fall Precaution Comments: BP goal 130-160 per neuro MD; Dense L side deficits Other Brace: AFO for LLE Restrictions Weight Bearing Restrictions: No General:   Vital Signs:  Pain:0/10      Therapy/Group: Individual Therapy  Ladoris Gene 03/07/2021, 12:23 PM

## 2021-03-07 NOTE — Progress Notes (Signed)
Occupational Therapy Session Note  Patient Details  Name: DRADEN COTTINGHAM MRN: [id number] Date of Birth: 03/02/1961  Today's Date: 03/07/2021 OT Individual Time: 1300-1330 OT Individual Time Calculation (min): 30 min    Short Term Goals: Week 2:  OT Short Term Goal 1 (Week 2): Pt will be able to roll onto his R side with min A to A caregivers. OT Short Term Goal 2 (Week 2): Pt will be able to sit to EOB with mod A of 1. OT Short Term Goal 3 (Week 2): Pt will be able to rise to stand with max A of 2 to prepare for toileting.  Skilled Therapeutic Interventions/Progress Updates:    Pt received supine in bed, excited for OT session. Session focused on postural control, integration of the LUE, and motor planning. Pt completed transfer to EOB toward the R with max A. He then was able to maintain sitting balance with intermittent LOB to the R and posterior with mod cueing for postural alignment and BUE placement, but overall CGA. OT noticed urine odor and had pt transfer back to supine for max A brief change. He then transferred to EOB again, toward the R side this time for a slideboard transfer to the TIS w/c, toward the L. Increased time provided to really allow pt to motor plan and reduced handling by OT/tech. Pt was able to scoot with mod A for positioning trunk (nose over toes), and mod A overall. Pt was left sitting up with all needs met, chair alarm set.   Therapy Documentation Precautions:  Precautions Precautions: Fall Precaution Comments: BP goal 130-160 per neuro MD; Dense L side deficits Other Brace: AFO for LLE Restrictions Weight Bearing Restrictions: No   Therapy/Group: Individual Therapy  Curtis Sites 03/07/2021, 12:31 PM

## 2021-03-07 NOTE — Progress Notes (Signed)
PROGRESS NOTE   Subjective/Complaints:  Pt up in w/c with OT. No new issues. Has oxygen on which we removed while he was sitting. Sats dropped to about 90% until he started doing some deep breathing. Denied sob  ROS: Patient denies fever, rash, sore throat, blurred vision, nausea, vomiting, diarrhea, cough, shortness of breath or chest pain, joint or back pain, headache, or mood change.   Objective:   No results found. No results for input(s): WBC, HGB, HCT, PLT in the last 72 hours.  Recent Labs    03/06/21 0515  CREATININE 0.80     Intake/Output Summary (Last 24 hours) at 03/07/2021 1046 Last data filed at 03/07/2021 0716 Gross per 24 hour  Intake 717 ml  Output 300 ml  Net 417 ml        Physical Exam: Vital Signs Blood pressure (!) 142/86, pulse 98, temperature 98.2 F (36.8 C), resp. rate 14, height 6\' 1"  (1.854 m), weight 117.9 kg, SpO2 (!) 86 %.    Constitutional: No distress . Vital signs reviewed. HEENT: NCAT, EOMI, oral membranes moist Neck: supple Cardiovascular: RRR without murmur. No JVD    Respiratory/Chest: CTA Bilaterally without wheezes or rales. Normal effort    GI/Abdomen: BS +, non-tender, non-distended Ext: no clubbing, cyanosis, or edema Psych: pleasant and cooperative  Skin: a few scattered abrasions Neuro:  Pt is alert and oriented x 3. Mild left central VII, mild left hemi-facial sensory loss. Reasonable insight, awareness and memory. Normal language, mild dysarthria. LUE 2+ deltoid, 3- biceps, tricep,   3 wrist/ HI-motor exam stable. LLE: trace to 1 HF, KE- , 0/5 distally-stable.  Left arm and leg 1/2 light touch. No resting tone.  Musculoskeletal: left heel cord remains a little tight   Assessment/Plan: 1. Functional deficits which require 3+ hours per day of interdisciplinary therapy in a comprehensive inpatient rehab setting. Physiatrist is providing close team supervision and  24 hour management of active medical problems listed below. Physiatrist and rehab team continue to assess barriers to discharge/monitor patient progress toward functional and medical goals  Care Tool:  Bathing    Body parts bathed by patient: Chest, Abdomen, Front perineal area, Face   Body parts bathed by helper: Right arm, Left arm, Buttocks, Left lower leg, Right lower leg, Left upper leg, Right upper leg     Bathing assist Assist Level: Maximal Assistance - Patient 24 - 49%     Upper Body Dressing/Undressing Upper body dressing   What is the patient wearing?: Pull over shirt    Upper body assist Assist Level: Maximal Assistance - Patient 25 - 49%    Lower Body Dressing/Undressing Lower body dressing      What is the patient wearing?: Pants     Lower body assist Assist for lower body dressing: 2 Helpers     Toileting Toileting    Toileting assist Assist for toileting: 2 Helpers     Transfers Chair/bed transfer  Transfers assist  Chair/bed transfer activity did not occur: Safety/medical concerns  Chair/bed transfer assist level: 2 Helpers (MAX A +2 SB txfr)     Locomotion Ambulation   Ambulation assist   Ambulation activity did not  occur: Safety/medical concerns          Walk 10 feet activity   Assist  Walk 10 feet activity did not occur: Safety/medical concerns        Walk 50 feet activity   Assist Walk 50 feet with 2 turns activity did not occur: Safety/medical concerns         Walk 150 feet activity   Assist Walk 150 feet activity did not occur: Safety/medical concerns         Walk 10 feet on uneven surface  activity   Assist Walk 10 feet on uneven surfaces activity did not occur: Safety/medical concerns         Wheelchair     Assist Is the patient using a wheelchair?: Yes Type of Wheelchair: Manual Wheelchair activity did not occur: Safety/medical concerns         Wheelchair 50 feet with 2 turns  activity    Assist    Wheelchair 50 feet with 2 turns activity did not occur: Safety/medical concerns       Wheelchair 150 feet activity     Assist  Wheelchair 150 feet activity did not occur: Safety/medical concerns       Blood pressure (!) 142/86, pulse 98, temperature 98.2 F (36.8 C), resp. rate 14, height 6\' 1"  (1.854 m), weight 117.9 kg, SpO2 (!) 86 %.  Medical Problem List and Plan: 1. 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  Antithrombotics: -DVT/anticoagulation:  Pharmaceutical: Lovenox             -antiplatelet therapy: Aspirin 325 mg daily and Plavix 75 mg daily x3 months then aspirin alone 3. Pain Management: R shoulder pain from fall -Neurontin 300 mg 3 times daily, dc oxycodone d/t "loopiness" -ROM, strengthening of R shoulder with therapies  -PRAFO RLE---discussed stretches with patient. Might order night splint 4. Mood:   12/16 doing well!             -antipsychotic agents: N/A 5. Neuropsych: This patient is capable of making decisions on his own behalf. 6. Skin/Wound Care: local care as needed 7. Fluids/Electrolytes/Nutrition: Routine in and out with follow-up chemistries 8.  Dysphagia.  Dysphagia #2 thin liquids.   -advance per speech therapy 9.  Hypertension.  Norvasc 10 mg daily Avapro 75 mg daily, Toprol-XL 25 mg daily  12/16- BP controlled continue regimen 10.  Diabetes mellitus.  Hemoglobin A1c 9.9.  SSI, Tradjenta 5 mg daily, Glucophage 1000 mg twice daily.   CBG (last 3)  Recent Labs    03/06/21 1638 03/06/21 2234 03/07/21 0615  GLUCAP 88 102* 107*   12/16  cbg's well controlled 11.  Hyperlipidemia.  Lipitor 12.  Tobacco use.  Counseling 13.  Seizure  prophylaxis.  Trileptal 300 mg 3 times daily 14. Constipation-  -large bm with sorbitol -continue senna-s at HS as mtc 15. Pulmonary edema/hypoxia- has been using 2L O2 as needed- more often than not--can't seem to wean him thus far. -CXR 12/8 shows mild pulmonary edema. Chest clear on exam 12/16 -will push FV/IS with patient. Sats increase when he's deep breathing  16. Dizziness? Resolved--- Likely d/t oxycodone which was dc'ed     LOS: 8 days A FACE TO FACE EVALUATION WAS PERFORMED  Ranelle Oyster 03/07/2021, 10:46 AM

## 2021-03-07 NOTE — Progress Notes (Signed)
Speech Language Pathology Weekly Progress and Session Note  Patient Details  Name: Brent Young MRN: [id number] Date of Birth: [birth year]  Beginning of progress report period: February 28, 2021 End of progress report period: March 07, 2021  Today's Date: 03/07/2021 SLP Individual Time: 1000-1030 SLP Individual Time Calculation (min): 30 min  Short Term Goals: Week 1: SLP Short Term Goal 1 (Week 1): Patient will consume current diet with minimal overt s/s of aspiration with supervision level verbal cues for use of swallowing compensatory strategies. SLP Short Term Goal 1 - Progress (Week 1): Met SLP Short Term Goal 2 (Week 1): Patient will demonstrate efficient mastication with complete oral clearance without overt s/s of aspiration over 2 sessions prior to upgrade with supervision level verbal cues. SLP Short Term Goal 2 - Progress (Week 1): Met SLP Short Term Goal 3 (Week 1): Patient will perform pharyngeal strengthening exercises with supervision level verbal cues for accuracy. SLP Short Term Goal 3 - Progress (Week 1): Discontinued (comment)    New Short Term Goals: Week 2: SLP Short Term Goal 1 (Week 2): Patient will consume current diet with minimal overt s/s of aspiration with Mod I for use of swallowing compensatory strategies. SLP Short Term Goal 2 (Week 2): Patient will demonstrate efficient mastication with complete oral clearance without overt s/s of aspiration with trials of regular textures over 2 sessions prior to upgrade with Mod I.  Weekly Progress Updates: Patient has made excellent gains and has met 2 of 2 STGs this reporting period. Currently, patient is consuming Dys. 3 textures with thin liquids with minimal overt s/s of aspiration with intermittent supervision level verbal cues needed for use of swallowing compensatory strategies. Focus of this upcoming reporting period will be on trials and tolerance with regular textures. Patient education ongoing. Patient would  benefit from continued skilled SLP intervention to maximize his swallowing function prior to discharge.      Intensity: Minumum of 1-2 x/day, 30 to 90 minutes Frequency: 3 to 5 out of 7 days Duration/Length of Stay: 03/28/20 but may be shorter for SLP Treatment/Interventions: Dysphagia/aspiration precaution training;Environmental controls;Cueing hierarchy;Therapeutic Activities;Functional tasks;Patient/family education   Daily Session  Skilled Therapeutic Interventions: Skilled treatment session focused on dysphagia goals. SLP facilitated session by providing trials of regular textures with thin liquids. Patient demonstrated efficient mastication with complete oral clearance without overt s/s of aspiration. However, patient with increased and consistent coughing with thin liquids via cup. Patient consumed trials of nectar-thick liquids without overt s/s of aspiration. Patient reported he prefers nectar-thick liquids at this time. Recommend patient downgrade to nectar-thick liquids, initiate the water protocol to allow water between meals and schedule an MBS to assess swallow function. Patient verbalized understanding and agreement with all recommendations. Patient left upright in wheelchair with alarm on and all needs within reach. Continue with current plan of care.    Pain Pain Assessment Pain Scale: 0-10 Pain Score: 0-No pain  Therapy/Group: Individual Therapy  Abbigail Anstey 03/07/2021, 6:41 AM

## 2021-03-08 LAB — GLUCOSE, CAPILLARY
Glucose-Capillary: 101 mg/dL — ABNORMAL HIGH (ref 70–99)
Glucose-Capillary: 115 mg/dL — ABNORMAL HIGH (ref 70–99)
Glucose-Capillary: 136 mg/dL — ABNORMAL HIGH (ref 70–99)
Glucose-Capillary: 125 mg/dL — ABNORMAL HIGH (ref 70–99)

## 2021-03-08 NOTE — Progress Notes (Signed)
Speech Language Pathology Daily Session Note  Patient Details  Name: TAYTUM WHELLER MRN: [id number] Date of Birth: 17-Aug-1960  Today's Date: 03/08/2021 SLP Individual Time: 1130-1200 SLP Individual Time Calculation (min): 30 min  Short Term Goals: Week 2: SLP Short Term Goal 1 (Week 2): Patient will consume current diet with minimal overt s/s of aspiration with Mod I for use of swallowing compensatory strategies. SLP Short Term Goal 2 (Week 2): Patient will demonstrate efficient mastication with complete oral clearance without overt s/s of aspiration with trials of regular textures over 2 sessions prior to upgrade with Mod I.  Skilled Therapeutic Interventions:   Patient seen for skilled ST session focusing on swallow goals. Patient agreeable to drinking plain water and demonstrating good awareness and recall of recommendations, declining any ice because primary SLP informed him that would be "mixed consistency". Patient gave self small individual cup sips of water with very mild instances of throat clearing but otherwise, appearing to tolerate well. SLP then reviewed swallow precautions and plan for MBS next week to determine if continued need for nectar thick liquids. Patient left in bed with all needs in place. He continues to benefit from skilled SLP intervention to maximize swallow function prior to discharge.  Pain Pain Assessment Pain Scale: 0-10 Pain Score: 0-No pain  Therapy/Group: Individual Therapy  Angela Nevin, MA, CCC-SLP Speech Therapy

## 2021-03-08 NOTE — Progress Notes (Signed)
Physical Therapy Session Note  Patient Details  Name: Brent Young MRN: [id number] Date of Birth: [birth year]  Today's Date: 03/08/2021 PT Individual Time: 1425-1520 PT Individual Time Calculation (min): 55 min   Short Term Goals: Week 2:  PT Short Term Goal 1 (Week 2): Pt will maintain static sitting balance with CGA and cues for >5 minutes. PT Short Term Goal 2 (Week 2): Pt will perform sit to stand transfer with modA +2. PT Short Term Goal 3 (Week 2): Pt will perform sliding board transfer with modA +1.  Skilled Therapeutic Interventions/Progress Updates:    Patient received sitting up in bed, agreeable to PT. He reports 6/10 pain in R shoulder, premedicated. PT providing rest breaks, distractions, repositioning to assist with pain management. Patient requesting to remain in bed due to fatigue- stating that he thought he only had 1 therapy session on the weekend (already had ST today). Patient completing quad sets 3x10 with noted activation of quads and hip extensors, but limited AROM observed. SAQ completed 3x10 with minimal AROM, but activation of muscles. Hooklying bridges with slightly L hip elevation noted, but adequate clearance of buttocks from bed. PT reviewing non-pharmacological pain management techniques for shoulder. Patient declined to sit edge of bed or attempt standing to practice carryover of previous muscle activation due to fatigue. PT donning stretching splint to L LE. Educating patient on difference between [facility] and passive stretch boot. Patient requesting to remain in bed in chair position to rest. Bed alarm on, call light within reach.   Therapy Documentation Precautions:  Precautions Precautions: Fall Precaution Comments: BP goal 130-160 per neuro MD; Dense L side deficits Other Brace: AFO for LLE Restrictions Weight Bearing Restrictions: No     Therapy/Group: Individual Therapy  Elizebeth Koller, PT, DPT, CBIS  03/08/2021, 8:00 AM

## 2021-03-09 LAB — GLUCOSE, CAPILLARY
Glucose-Capillary: 125 mg/dL — ABNORMAL HIGH (ref 70–99)
Glucose-Capillary: 108 mg/dL — ABNORMAL HIGH (ref 70–99)
Glucose-Capillary: 137 mg/dL — ABNORMAL HIGH (ref 70–99)
Glucose-Capillary: 156 mg/dL — ABNORMAL HIGH (ref 70–99)

## 2021-03-09 NOTE — Progress Notes (Signed)
PROGRESS NOTE   Subjective/Complaints:   No c/os today  Friend at bedside,  Friend reports privately to me that no family has visited   ROS: Patient denies CP, SOB<,N/V/D  Objective:   No results found. No results for input(s): WBC, HGB, HCT, PLT in the last 72 hours.  No results for input(s): NA, K, CL, CO2, GLUCOSE, BUN, CREATININE, CALCIUM in the last 72 hours.    Intake/Output Summary (Last 24 hours) at 03/09/2021 1123 Last data filed at 03/09/2021 0858 Gross per 24 hour  Intake 472 ml  Output 200 ml  Net 272 ml         Physical Exam: Vital Signs Blood pressure (!) 142/78, pulse 87, temperature 98 F (36.7 C), temperature source Oral, resp. rate 16, height 6\' 1"  (1.854 m), weight 117.9 kg, SpO2 96 %.   General: No acute distress Mood and affect are appropriate Heart: Regular rate and rhythm no rubs murmurs or extra sounds Lungs: Clear to auscultation, breathing unlabored, no rales or wheezes Abdomen: Positive bowel sounds, soft nontender to palpation, nondistended Extremities: No clubbing, cyanosis, or edema Skin: No evidence of breakdown, no evidence of rash   Neuro:  Pt is alert and oriented x 3. Mild left central VII, mild left hemi-facial sensory loss. Reasonable insight, awareness and memory. Normal language, mild dysarthria. LUE 2+ deltoid, 3- biceps, tricep,   3 wrist/ HI-motor exam stable. LLE: trace to 1 HF, KE- , 0/5 distally-stable.  Left arm and leg 1/2 light touch. No resting tone.  Musculoskeletal: left heel cord remains a little tight   Assessment/Plan: 1. Functional deficits which require 3+ hours per day of interdisciplinary therapy in a comprehensive inpatient rehab setting. Physiatrist is providing close team supervision and 24 hour management of active medical problems listed below. Physiatrist and rehab team continue to assess barriers to discharge/monitor patient progress toward  functional and medical goals  Care Tool:  Bathing    Body parts bathed by patient: Chest, Abdomen, Front perineal area, Face   Body parts bathed by helper: Right arm, Left arm, Buttocks, Left lower leg, Right lower leg, Left upper leg, Right upper leg     Bathing assist Assist Level: Maximal Assistance - Patient 24 - 49%     Upper Body Dressing/Undressing Upper body dressing   What is the patient wearing?: Pull over shirt    Upper body assist Assist Level: Maximal Assistance - Patient 25 - 49%    Lower Body Dressing/Undressing Lower body dressing      What is the patient wearing?: Pants     Lower body assist Assist for lower body dressing: 2 Helpers     Toileting Toileting    Toileting assist Assist for toileting: 2 Helpers     Transfers Chair/bed transfer  Transfers assist  Chair/bed transfer activity did not occur: Safety/medical concerns  Chair/bed transfer assist level: 2 Helpers     Locomotion Ambulation   Ambulation assist   Ambulation activity did not occur: Safety/medical concerns          Walk 10 feet activity   Assist  Walk 10 feet activity did not occur: Safety/medical concerns  Walk 50 feet activity   Assist Walk 50 feet with 2 turns activity did not occur: Safety/medical concerns         Walk 150 feet activity   Assist Walk 150 feet activity did not occur: Safety/medical concerns         Walk 10 feet on uneven surface  activity   Assist Walk 10 feet on uneven surfaces activity did not occur: Safety/medical concerns         Wheelchair     Assist Is the patient using a wheelchair?: Yes Type of Wheelchair: Manual Wheelchair activity did not occur: Safety/medical concerns         Wheelchair 50 feet with 2 turns activity    Assist    Wheelchair 50 feet with 2 turns activity did not occur: Safety/medical concerns       Wheelchair 150 feet activity     Assist  Wheelchair 150 feet  activity did not occur: Safety/medical concerns       Blood pressure (!) 142/78, pulse 87, temperature 98 F (36.7 C), temperature source Oral, resp. rate 16, height 6\' 1"  (1.854 m), weight 117.9 kg, SpO2 96 %.  Medical Problem List and Plan: 1. 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  Antithrombotics: -DVT/anticoagulation:  Pharmaceutical: Lovenox             -antiplatelet therapy: Aspirin 325 mg daily and Plavix 75 mg daily x3 months then aspirin alone 3. Pain Management: R shoulder pain from fall -Neurontin 300 mg 3 times daily, dc oxycodone d/t "loopiness" -ROM, strengthening of R shoulder with therapies  -PRAFO RLE---discussed stretches with patient. Might order night splint 4. Mood:   12/16 doing well!             -antipsychotic agents: N/A 5. Neuropsych: This patient is capable of making decisions on his own behalf. 6. Skin/Wound Care: local care as needed 7. Fluids/Electrolytes/Nutrition: Routine in and out with follow-up chemistries 8.  Dysphagia.  Dysphagia #2 thin liquids.   -advance per speech therapy 9.  Hypertension.  Norvasc 10 mg daily Avapro 75 mg daily, Toprol-XL 25 mg daily  12/16- BP controlled continue regimen 10.  Diabetes mellitus.  Hemoglobin A1c 9.9.  SSI, Tradjenta 5 mg daily, Glucophage 1000 mg twice daily.   CBG (last 3)  Recent Labs    03/08/21 1638 03/08/21 2103 03/09/21 0625  GLUCAP 136* 125* 125*    12/18 controlled  11.  Hyperlipidemia.  Lipitor 12.  Tobacco use.  Counseling 13.  Seizure prophylaxis.  Trileptal 300 mg 3 times daily 14. Constipation-  -large bm with sorbitol -continue senna-s at HS as mtc 15. Pulmonary edema/hypoxia- has been using 2L O2 as needed-  more often than not--can't seem to wean him thus far. -CXR 12/8 shows mild pulmonary edema. Chest clear on exam 12/16 -will push FV/IS with patient. Sats increase when he's deep breathing  16. Dizziness? Resolved--- Likely d/t oxycodone which was dc'ed     LOS: 10 days A FACE TO FACE EVALUATION WAS PERFORMED  1/17 03/09/2021, 11:23 AM

## 2021-03-09 NOTE — Plan of Care (Signed)
°  Problem: Consults Goal: RH STROKE PATIENT EDUCATION Description: See Patient Education module for education specifics  Outcome: Progressing Goal: Diabetes Guidelines if Diabetic/Glucose > 140 Description: If diabetic or lab glucose is > 140 mg/dl - Initiate Diabetes/Hyperglycemia Guidelines & Document Interventions  Outcome: Progressing   Problem: RH SKIN INTEGRITY Goal: RH STG MAINTAIN SKIN INTEGRITY WITH ASSISTANCE Description: STG Maintain Skin Integrity With Min Assistance. Outcome: Progressing Goal: RH STG ABLE TO PERFORM INCISION/WOUND CARE W/ASSISTANCE Description: STG Able To Perform Incision/Wound Care With [employer]. Outcome: Progressing   Problem: RH SAFETY Goal: RH STG ADHERE TO SAFETY PRECAUTIONS W/ASSISTANCE/DEVICE Description: STG Adhere to Safety Precautions With Cues and Reminders. Outcome: Progressing   Problem: RH COGNITION-NURSING Goal: RH STG ANTICIPATES NEEDS/CALLS FOR ASSIST W/ASSIST/CUES Description: STG Anticipates Needs/Calls for Assist With Cues and Reminders. Outcome: Progressing   Problem: RH KNOWLEDGE DEFICIT Goal: RH STG INCREASE KNOWLEDGE OF DIABETES Description: Patient will demonstrate knowledge of diabetes medication management, insulin management, and dietary recommendations with educational materials and handouts provided by staff independently at discharge. Outcome: Progressing Goal: RH STG INCREASE KNOWLEDGE OF HYPERTENSION Description: Patient will demonstrate knowledge of HTN medications, BP management, and dietary recommendations with educational materials and handouts provided by staff independently at discharge.  Outcome: Progressing Goal: RH STG INCREASE KNOWLEGDE OF HYPERLIPIDEMIA Description: Patient will demonstrate knowledge of cholesterol medication, dietary recommendations, and lab values with educational materials and handouts provided by staff independently at discharge.  Outcome: Progressing Goal: RH STG INCREASE  KNOWLEDGE OF STROKE PROPHYLAXIS Description: Patient will demonstrate knowledge of medications used to treat and prevent future strokes with educational materials and handouts provided by staff independently at discharge. Outcome: Progressing

## 2021-03-10 ENCOUNTER — Inpatient Hospital Stay (HOSPITAL_COMMUNITY): Payer: Medicaid Other

## 2021-03-10 LAB — GLUCOSE, CAPILLARY
Glucose-Capillary: 124 mg/dL — ABNORMAL HIGH (ref 70–99)
Glucose-Capillary: 142 mg/dL — ABNORMAL HIGH (ref 70–99)
Glucose-Capillary: 108 mg/dL — ABNORMAL HIGH (ref 70–99)
Glucose-Capillary: 167 mg/dL — ABNORMAL HIGH (ref 70–99)

## 2021-03-10 MED ORDER — TRAZODONE HCL 50 MG PO TABS
50.0000 mg | ORAL_TABLET | Freq: Every evening | ORAL | Status: DC | PRN
  Administered 2021-03-10 – 2021-03-17 (×4): 50 mg via ORAL
  Filled 2021-03-10 (×5): qty 1

## 2021-03-10 NOTE — Progress Notes (Signed)
Flutter valve and incentive spirometer teaching completed with pt. Pt in agreement. Devices left at bedside for pt use.  Mylo Red, LPN

## 2021-03-10 NOTE — Progress Notes (Signed)
Physical Therapy Session Note  Patient Details  Name: Brent Young MRN: [id number] Date of Birth: 1960/05/30  Today's Date: 03/10/2021 PT Individual Time: 3300-7622 and 6333-5456 PT Individual Time Calculation (min): 55 min and 54 min  Short Term Goals: Week 2:  PT Short Term Goal 1 (Week 2): Pt will maintain static sitting balance with CGA and cues for >5 minutes. PT Short Term Goal 2 (Week 2): Pt will perform sit to stand transfer with modA +2. PT Short Term Goal 3 (Week 2): Pt will perform sliding board transfer with modA +1.  Skilled Therapeutic Interventions/Progress Updates:     1st Session: Pt received supine in bed ad agrees to therapy. No complaint of pain. Pt performs supine to sit with maxA and cues at hips and trunk for sequencing and body mechanics. Sliding board transfer to [facility] with totalA +1 due to poor motor planning and inefficient pushing with R leg and arm. WC transport to gym for time management. Pt performs sit to stand in parallel bars with heavy maxA and cues for anterior weight shifting and sequencing, with heavy manual assist at hips and trunk. Pt gradually transitions into near full standing but still lacking some hip extension. Following extended seated rest break, PT adds mirror for visual feedback and pt performs additional sit to stand with maxA but improved sequencing mechanics and improved upright standing posture. One final reps of sit to stand but pt appears to be fatiguing and is unable to achieve full upright posture. MaxA provided and max verbal cues. WC transport back to room. Left seated in WC with alarm intact and all needs within reach.  2nd Session: Pt received seated in [facility] and agrees to therapy. No complaint of pain. WC transport to gym for time management. PT educates and demonstrates squat pivot transfer/lateral  transfers prior to pt attempting. Pt then completes transfer to mat table with modA +2, performing many small scoots. Once on mat, pt  performs transitional movement between sitting and clearing buttocks from mat to internalize motor planning necessary for optimal transfer. PT provides verbal and tactile cueing for initiation, body mechanics, and motor activation. On final attempt pt performs sit to stand with modA/maxA +1. PT has pt's knees blocked for both extension and to prevent lateral pushing with R leg.   Sit to supine with modA +1. Pt performs 3x10 supine bridges with multimodal cues for optimal muscular activation and performance. Pt performs 3x10 clamshells in hooklying with orange theraband with repeated tactile cues at gluteus medius and adductors for NM feedback. Supine to sit with maxA. Pt performs stand pivot transfer from mat to [facility] with maxA and cues for body mechanics. Pt left seated in WC with alarm intact and all needs within reach.  Therapy Documentation Precautions:  Precautions Precautions: Fall Precaution Comments: BP goal 130-160 per neuro MD; Dense L side deficits Other Brace: AFO for LLE Restrictions Weight Bearing Restrictions: No   Therapy/Group: Individual Therapy  Beau Fanny, PT, DPT 03/10/2021, 4:05 PM

## 2021-03-10 NOTE — Progress Notes (Addendum)
Patient awake most of the night. Patient states "I can't get comfortable". Attempted to readjust him multiple times. Currently laying on left side with call bell with in reach. Will continue with plan of care.

## 2021-03-10 NOTE — Progress Notes (Signed)
Occupational Therapy Session Note  Patient Details  Name: Brent Young MRN: [id number] Date of Birth: 11-27-1960  Today's Date: 03/10/2021 OT Individual Time: 2706-2376 OT Individual Time Calculation (min): 26 min   Short Term Goals: Week 2:  OT Short Term Goal 1 (Week 2): Pt will be able to roll onto his R side with min A to A caregivers. OT Short Term Goal 2 (Week 2): Pt will be able to sit to EOB with mod A of 1. OT Short Term Goal 3 (Week 2): Pt will be able to rise to stand with max A of 2 to prepare for toileting.  Skilled Therapeutic Interventions/Progress Updates:    Pt greeted seated in wc and agreeable to OT treatment session. Pt requested to brush his teeth. OT placed foam on toothbrush and pt able to grasp toothrbush while placing toothpaste. Pt then needed to brush teeth with R hand. Pt then worked on sit<>stands with max+2 at the sink with mirror feedback to decrease pushing to the L. Pt needed extended rest breaks in between stands. Pt left seated in wc at end of session with alarm belt on, call bell in reach, and needs met.   Therapy Documentation Precautions:  Precautions Precautions: Fall Precaution Comments: BP goal 130-160 per neuro MD; Dense L side deficits Other Brace: AFO for LLE Restrictions Weight Bearing Restrictions: No Pain:  Denies pain   Therapy/Group: Individual Therapy  Valma Cava 03/10/2021, 3:19 PM

## 2021-03-10 NOTE — Progress Notes (Signed)
PROGRESS NOTE   Subjective/Complaints:   Pt didn't sleep well. Nervous about MBS today. No issues breathing but still on oxygen. Doesn't have either FV or IS at bedside  ROS: Patient denies fever, rash, sore throat, blurred vision, nausea, vomiting, diarrhea, cough, shortness of breath or chest pain, joint or back pain, headache, or mood change.   Objective:   No results found. No results for input(s): WBC, HGB, HCT, PLT in the last 72 hours.  No results for input(s): NA, K, CL, CO2, GLUCOSE, BUN, CREATININE, CALCIUM in the last 72 hours.    Intake/Output Summary (Last 24 hours) at 03/10/2021 1032 Last data filed at 03/10/2021 0700 Gross per 24 hour  Intake 238 ml  Output 750 ml  Net -512 ml        Physical Exam: Vital Signs Blood pressure (!) 152/84, pulse 97, temperature 98.2 F (36.8 C), temperature source Oral, resp. rate 20, height 6\' 1"  (1.854 m), weight 117.9 kg, SpO2 97 %.   Constitutional: No distress . Vital signs reviewed. HEENT: NCAT, EOMI, oral membranes moist Neck: supple Cardiovascular: RRR without murmur. No JVD    Respiratory/Chest: CTA Bilaterally without wheezes or rales. Normal effort    GI/Abdomen: BS +, non-tender, non-distended Ext: no clubbing, cyanosis, or edema Psych: pleasant and cooperative  Skin: No evidence of breakdown, no evidence of rash Neuro:  Pt is alert and oriented x 3. Mild left central VII, mild left hemi-facial sensory loss. Reasonable insight, awareness and memory. Normal language, mild dysarthria. LUE 2+ deltoid, 3- biceps, tricep,   3 wrist/ HI-motor exam stable. LLE:   1 to 1+ HF, KE- , 0/5 distally-stable.  Left arm and leg 1/2 light touch. No resting tone.  Musculoskeletal: left heel cord remains a little tight but can be stretched to neutral.   Assessment/Plan: 1. Functional deficits which require 3+ hours per day of interdisciplinary therapy in a comprehensive  inpatient rehab setting. Physiatrist is providing close team supervision and 24 hour management of active medical problems listed below. Physiatrist and rehab team continue to assess barriers to discharge/monitor patient progress toward functional and medical goals  Care Tool:  Bathing    Body parts bathed by patient: Chest, Abdomen, Front perineal area, Face   Body parts bathed by helper: Right arm, Left arm, Buttocks, Left lower leg, Right lower leg, Left upper leg, Right upper leg     Bathing assist Assist Level: Maximal Assistance - Patient 24 - 49%     Upper Body Dressing/Undressing Upper body dressing   What is the patient wearing?: Pull over shirt    Upper body assist Assist Level: Maximal Assistance - Patient 25 - 49%    Lower Body Dressing/Undressing Lower body dressing      What is the patient wearing?: Pants     Lower body assist Assist for lower body dressing: 2 Helpers     Toileting Toileting    Toileting assist Assist for toileting: 2 Helpers     Transfers Chair/bed transfer  Transfers assist  Chair/bed transfer activity did not occur: Safety/medical concerns  Chair/bed transfer assist level: 2 Helpers     Locomotion Ambulation   Ambulation assist  Ambulation activity did not occur: Safety/medical concerns          Walk 10 feet activity   Assist  Walk 10 feet activity did not occur: Safety/medical concerns        Walk 50 feet activity   Assist Walk 50 feet with 2 turns activity did not occur: Safety/medical concerns         Walk 150 feet activity   Assist Walk 150 feet activity did not occur: Safety/medical concerns         Walk 10 feet on uneven surface  activity   Assist Walk 10 feet on uneven surfaces activity did not occur: Safety/medical concerns         Wheelchair     Assist Is the patient using a wheelchair?: Yes Type of Wheelchair: Manual Wheelchair activity did not occur: Safety/medical  concerns         Wheelchair 50 feet with 2 turns activity    Assist    Wheelchair 50 feet with 2 turns activity did not occur: Safety/medical concerns       Wheelchair 150 feet activity     Assist  Wheelchair 150 feet activity did not occur: Safety/medical concerns       Blood pressure (!) 152/84, pulse 97, temperature 98.2 F (36.8 C), temperature source Oral, resp. rate 20, height 6\' 1"  (1.854 m), weight 117.9 kg, SpO2 97 %.  Medical Problem List and Plan: 1. 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 Reassured him about MBS      2.  Antithrombotics: -DVT/anticoagulation:  Pharmaceutical: Lovenox             -antiplatelet therapy: Aspirin 325 mg daily and Plavix 75 mg daily x3 months then aspirin alone 3. 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 Mood:   12/16 doing well!             -antipsychotic agents: N/A 5. Neuropsych: This patient is capable of making decisions on his own behalf. 6. Skin/Wound Care: local care as needed 7. Fluids/Electrolytes/Nutrition: Routine in and out with follow-up chemistries 8.  Dysphagia.  Dysphagia #2 thin liquids.   -advance per speech therapy, MBS 9.  Hypertension.  Norvasc 10 mg daily Avapro 75 mg daily, Toprol-XL 25 mg daily  12/19- BP controlled continue regimen 10.  Diabetes mellitus.  Hemoglobin A1c 9.9.  SSI, Tradjenta 5 mg daily, Glucophage 1000 mg twice daily.   CBG (last 3)  Recent Labs    03/09/21 1635 03/09/21 2054 03/10/21 0602  GLUCAP 137* 156* 124*   12/19 reasonably controlled   11.  Hyperlipidemia.  Lipitor 12.  Tobacco use.  Counseling 13.  Seizure prophylaxis.  Trileptal 300 mg 3 times daily 14. Constipation-  -large bm with sorbitol -continue senna-s at HS as mtc 15. Pulmonary edema/hypoxia- has been using 2L O2 as needed- more often than not--can't seem to wean him thus far. -CXR 12/8 shows mild pulmonary edema. Chest clear on exam 12/19  doesn't have FV/IS at bedside. Sats increase when he's deep breathing  16. Dizziness? Resolved--- Likely d/t oxycodone which was dc'ed     LOS: 11 days A FACE TO FACE EVALUATION WAS PERFORMED  Ranelle Oyster 03/10/2021, 10:32 AM

## 2021-03-10 NOTE — Progress Notes (Signed)
Modified Barium Swallow Progress Note  Patient Details  Name: Brent Young MRN: [id number] Date of Birth: Feb 11, 1961  Today's Date: 03/10/2021  Modified Barium Swallow completed.  Full report located under Chart Review in the Imaging Section.  Brief recommendations include the following:  Clinical Impression  Patient demonstrates a mild oropharyngeal characterized by decreased bolus cohesion with premature spillage to the pyriform sinuses with thin liquids resulting in intermittent flash penetration. No aspiration noted despite large, sequential sips. Recommend patient upgrade to regular textures with thin liquids.   Swallow Evaluation Recommendations       SLP Diet Recommendations: Regular solids;Thin liquid   Liquid Administration via: Cup   Medication Administration: Whole meds with puree   Supervision: Patient able to self feed;Intermittent supervision to cue for compensatory strategies   Compensations: Slow rate;Small sips/bites;Minimize environmental distractions       Oral Care Recommendations: Oral care BID        Monaye Blackie 03/10/2021,12:13 PM

## 2021-03-11 ENCOUNTER — Inpatient Hospital Stay (HOSPITAL_COMMUNITY): Payer: Medicaid Other

## 2021-03-11 LAB — GLUCOSE, CAPILLARY
Glucose-Capillary: 119 mg/dL — ABNORMAL HIGH (ref 70–99)
Glucose-Capillary: 118 mg/dL — ABNORMAL HIGH (ref 70–99)
Glucose-Capillary: 129 mg/dL — ABNORMAL HIGH (ref 70–99)
Glucose-Capillary: 115 mg/dL — ABNORMAL HIGH (ref 70–99)

## 2021-03-11 NOTE — Progress Notes (Signed)
Occupational Therapy Session Note  Patient Details  Name: Brent Young MRN: [id number] Date of Birth: [birth year]  Today's Date: 03/11/2021 OT Individual Time: 1350-1415 OT Individual Time Calculation (min): 25 min    Short Term Goals: Week 2:  OT Short Term Goal 1 (Week 2): Pt will be able to roll onto his R side with min A to A caregivers. OT Short Term Goal 2 (Week 2): Pt will be able to sit to EOB with mod A of 1. OT Short Term Goal 3 (Week 2): Pt will be able to rise to stand with max A of 2 to prepare for toileting.   Skilled Therapeutic Interventions/Progress Updates:    Pt received supine, with NT reporting they are waiting on x-ray so pt should remain bed level. Session focused on LUE NMR. He used a boomwacker, grasping it with min facilitation and then completed functional reaching to challenge shoulder and elbow flexion. Cueing required for body mechanics and proximal stability. Pt achieving 100 degrees of flexion at the elbow and 25 degrees of shoulder flexion. Pt was left supine with all needs met, bed alarm set.   Therapy Documentation Precautions:  Precautions Precautions: Fall Precaution Comments: BP goal 130-160 per neuro MD; Dense L side deficits Other Brace: AFO for LLE Restrictions Weight Bearing Restrictions: No  Therapy/Group: Individual Therapy  Curtis Sites 03/11/2021, 6:30 AM

## 2021-03-11 NOTE — Progress Notes (Signed)
PROGRESS NOTE   Subjective/Complaints:  Slept well. Pleased that his diet got upgraded. Coughs occasionally on thins. Says he's working hard. Happy that leg is turning on  ROS: Patient denies fever, rash, sore throat, blurred vision, nausea, vomiting, diarrhea, cough, shortness of breath or chest pain, joint or back pain, headache, or mood change.   Objective:   DG Swallowing Func-Speech Pathology  Result Date: 03/10/2021 Table formatting from the original result was not included. Objective Swallowing Evaluation: Type of Study: MBS-Modified Barium Swallow Study  Patient Details Name: Brent Young MRN: [id number] Date of Birth: Aug 01, 1960 Today's Date: 03/10/2021 Past Medical History: Past Medical History: Diagnosis Date  Allergy   Arthritis   Diabetes mellitus without complication (HCC)   Hypertension   Stroke [facility])  Past Surgical History: Past Surgical History: Procedure Laterality Date  FRACTURE SURGERY Left   Patient fractured left arm  IR ANGIO INTRA EXTRACRAN SEL COM CAROTID INNOMINATE UNI L MOD SED  02/19/2021  IR ANGIO INTRA EXTRACRAN SEL INTERNAL CAROTID UNI R MOD SED  02/19/2021  IR ANGIO VERTEBRAL SEL SUBCLAVIAN INNOMINATE UNI R MOD SED  02/19/2021  IR ANGIO VERTEBRAL SEL VERTEBRAL UNI L MOD SED  02/19/2021  IR US GUIDE VASC ACCESS RIGHT  02/19/2021 HPI: See H&P  Subjective: Pt seated upright in bed. No family present  Recommendations for follow up therapy are one component of a multi-disciplinary discharge planning process, led by the attending physician.  Recommendations may be updated based on patient status, additional functional criteria and insurance authorization. Assessment / Plan / Recommendation Clinical Impressions 03/10/2021 Clinical Impression Patient demonstrates a mild oropharyngeal characterized by decreased bolus cohesion with premature spillage to the pyriform sinuses with thin liquids resulting in intermittent flash  penetration. No aspiration noted despite large, sequential sips. Recommend patient upgrade to regular textures with thin liquids. SLP Visit Diagnosis Dysphagia, oropharyngeal phase (R13.12) Attention and concentration deficit following -- Frontal lobe and executive function deficit following -- Impact on safety and function Mild aspiration risk   Treatment Recommendations 03/10/2021 Treatment Recommendations Therapy as outlined in treatment plan below   Prognosis 03/10/2021 Prognosis for Safe Diet Advancement Good Barriers to Reach Goals -- Barriers/Prognosis Comment -- Diet Recommendations 03/10/2021 SLP Diet Recommendations Regular solids;Thin liquid Liquid Administration via Cup Medication Administration Whole meds with puree Compensations Slow rate;Small sips/bites;Minimize environmental distractions Postural Changes --   Other Recommendations 03/10/2021 Recommended Consults -- Oral Care Recommendations Oral care BID Other Recommendations -- Follow Up Recommendations No SLP follow up Assistance recommended at discharge None Functional Status Assessment -- Frequency and Duration  03/10/2021 Speech Therapy Frequency (ACUTE ONLY) min 3x week Treatment Duration 1 week   Oral Phase 03/10/2021 Oral Phase Impaired Oral - Pudding Teaspoon -- Oral - Pudding Cup -- Oral - Honey Teaspoon -- Oral - Honey Cup -- Oral - Nectar Teaspoon -- Oral - Nectar Cup -- Oral - Nectar Straw -- Oral - Thin Teaspoon -- Oral - Thin Cup Decreased bolus cohesion;Premature spillage Oral - Thin Straw Decreased bolus cohesion;Premature spillage Oral - Puree -- Oral - Mech Soft -- Oral - Regular WFL Oral - Multi-Consistency -- Oral - Pill -- Oral Phase - Comment --  Pharyngeal  Phase 03/10/2021 Pharyngeal Phase Impaired Pharyngeal- Pudding Teaspoon -- Pharyngeal -- Pharyngeal- Pudding Cup -- Pharyngeal -- Pharyngeal- Honey Teaspoon -- Pharyngeal -- Pharyngeal- Honey Cup -- Pharyngeal -- Pharyngeal- Nectar Teaspoon -- Pharyngeal -- Pharyngeal-  Nectar Cup -- Pharyngeal -- Pharyngeal- Nectar Straw -- Pharyngeal -- Pharyngeal- Thin Teaspoon -- Pharyngeal -- Pharyngeal- Thin Cup Delayed swallow initiation-vallecula;Penetration/Aspiration during swallow Pharyngeal Material enters airway, remains ABOVE vocal cords then ejected out Pharyngeal- Thin Straw Delayed swallow initiation-vallecula;Penetration/Aspiration during swallow Pharyngeal Material enters airway, remains ABOVE vocal cords then ejected out Pharyngeal- Puree -- Pharyngeal -- Pharyngeal- Mechanical Soft -- Pharyngeal -- Pharyngeal- Regular Delayed swallow initiation-vallecula Pharyngeal -- Pharyngeal- Multi-consistency -- Pharyngeal -- Pharyngeal- Pill -- Pharyngeal -- Pharyngeal Comment --  No flowsheet data found. PAYNE, COURTNEY 03/10/2021, 12:14 PM    Feliberto Gottron, MA, CCC-SLP                  No results for input(s): WBC, HGB, HCT, PLT in the last 72 hours.  No results for input(s): NA, K, CL, CO2, GLUCOSE, BUN, CREATININE, CALCIUM in the last 72 hours.    Intake/Output Summary (Last 24 hours) at 03/11/2021 0911 Last data filed at 03/11/2021 0700 Gross per 24 hour  Intake 720 ml  Output 300 ml  Net 420 ml        Physical Exam: Vital Signs Blood pressure 134/66, pulse (!) 102, temperature 97.7 F (36.5 C), resp. rate 16, height 6\' 1"  (1.854 m), weight 117.9 kg, SpO2 96 %.   Constitutional: No distress . Vital signs reviewed.obese HEENT: NCAT, EOMI, oral membranes moist Neck: supple Cardiovascular: RRR without murmur. No JVD    Respiratory/Chest: CTA Bilaterally without wheezes or rales. Normal effort    GI/Abdomen: BS +, non-tender, non-distended Ext: no clubbing, cyanosis, or edema Psych: pleasant and cooperative  Skin: No evidence of breakdown, no evidence of rash Neuro:  Pt is alert and oriented x 3. Mild left central VII, mild left hemi-facial sensory loss. Reasonable insight, awareness and memory. Normal language, mild dysarthria. LUE 2+ deltoid, 3- biceps,  tricep,   3 wrist/ HI-motor exam stable. LLE:   1 to 1+ HF, KE- , 0/5 distally-no change.  Left arm and leg 1/2 light touch. No resting tone.  Musculoskeletal: left heel cord reducible. Not in night splint   Assessment/Plan: 1. Functional deficits which require 3+ hours per day of interdisciplinary therapy in a comprehensive inpatient rehab setting. Physiatrist is providing close team supervision and 24 hour management of active medical problems listed below. Physiatrist and rehab team continue to assess barriers to discharge/monitor patient progress toward functional and medical goals  Care Tool:  Bathing    Body parts bathed by patient: Chest, Abdomen, Front perineal area, Face   Body parts bathed by helper: Right arm, Left arm, Buttocks, Left lower leg, Right lower leg, Left upper leg, Right upper leg     Bathing assist Assist Level: Maximal Assistance - Patient 24 - 49%     Upper Body Dressing/Undressing Upper body dressing   What is the patient wearing?: Pull over shirt    Upper body assist Assist Level: Maximal Assistance - Patient 25 - 49%    Lower Body Dressing/Undressing Lower body dressing      What is the patient wearing?: Pants     Lower body assist Assist for lower body dressing: 2 Helpers     Toileting Toileting    Toileting assist Assist for toileting: 2 Helpers     Transfers Chair/bed transfer  Transfers assist  Chair/bed transfer activity did not occur: Safety/medical concerns  Chair/bed transfer assist level: 2 Helpers     Locomotion Ambulation   Ambulation assist   Ambulation activity did not occur: Safety/medical concerns          Walk 10 feet activity   Assist  Walk 10 feet activity did not occur: Safety/medical concerns        Walk 50 feet activity   Assist Walk 50 feet with 2 turns activity did not occur: Safety/medical concerns         Walk 150 feet activity   Assist Walk 150 feet activity did not occur:  Safety/medical concerns         Walk 10 feet on uneven surface  activity   Assist Walk 10 feet on uneven surfaces activity did not occur: Safety/medical concerns         Wheelchair     Assist Is the patient using a wheelchair?: Yes Type of Wheelchair: Manual Wheelchair activity did not occur: Safety/medical concerns         Wheelchair 50 feet with 2 turns activity    Assist    Wheelchair 50 feet with 2 turns activity did not occur: Safety/medical concerns       Wheelchair 150 feet activity     Assist  Wheelchair 150 feet activity did not occur: Safety/medical concerns       Blood pressure 134/66, pulse (!) 102, temperature 97.7 F (36.5 C), resp. rate 16, height 6\' 1"  (1.854 m), weight 117.9 kg, SpO2 96 %.  Medical Problem List and Plan: 1. 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 Interdisciplinary team conference today to discuss goals, barriers to discharge, and dc planning.      2.  Antithrombotics: -DVT/anticoagulation:  Pharmaceutical: Lovenox             -antiplatelet therapy: Aspirin 325 mg daily and Plavix 75 mg daily x3 months then aspirin alone 3. 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 Mood:   12/20 doing well!             -antipsychotic agents: N/A 5. Neuropsych: This patient is capable of making decisions on his own behalf. 6. Skin/Wound Care: local care as needed 7. Fluids/Electrolytes/Nutrition: Routine in and out with follow-up chemistries 8.  Dysphagia.  after MBS advanced to regular  thins  -needs to take his time, small sips 9.  Hypertension.  Norvasc 10 mg daily Avapro 75 mg daily, Toprol-XL 25 mg daily  12/20- BP controlled continue regimen 10.  Diabetes mellitus.  Hemoglobin A1c 9.9.  SSI, Tradjenta 5 mg daily, Glucophage 1000 mg twice daily.   CBG (last 3)  Recent Labs    03/10/21 1639 03/10/21 2105 03/11/21 0633  GLUCAP 108* 167* 119*   12/20 reasonably controlled  11.  Hyperlipidemia.  Lipitor 12.  Tobacco use.  Counseling 13.  Seizure prophylaxis.  Trileptal 300 mg 3 times daily 14. Constipation-  -large bm  with sorbitol -continue senna-s at HS as mtc 15. Pulmonary edema/hypoxia- has been using 2L O2 as needed- more often than not--can't seem to wean him thus far. -CXR 12/8 shows mild pulmonary edema. Chest clear on exam 12/20 FV, IS, will dc oxygen, encourage use of FV/IS for lower sats  -check cxr again today to f/u infiltrate 16. Dizziness? Resolved--- Likely d/t oxycodone which was dc'ed     LOS: 12 days A FACE TO FACE EVALUATION WAS PERFORMED  Ranelle Oyster 03/11/2021, 9:11 AM

## 2021-03-11 NOTE — Progress Notes (Signed)
Occupational Therapy Session Note  Patient Details  Name: Brent Young MRN: [id number] Date of Birth: 01-Apr-1960  Today's Date: 03/11/2021 OT Individual Time: 6435-3912 OT Individual Time Calculation (min): 71 min    Short Term Goals: Week 2:  OT Short Term Goal 1 (Week 2): Pt will be able to roll onto his R side with min A to A caregivers. OT Short Term Goal 2 (Week 2): Pt will be able to sit to EOB with mod A of 1. OT Short Term Goal 3 (Week 2): Pt will be able to rise to stand with max A of 2 to prepare for toileting.  Skilled Therapeutic Interventions/Progress Updates:   Pt greeted semi-reclined in bed and agreeable to OT treatment session. Pt completed bed mobility with max A. Worked on sitting balance at EOB with OT using NDT techniques to facilitate trunk elongation/extension and sitting balance. Overall mod A for sitting balance. Sit<>stand in [place] with max +2 to get to the TIS wc. Worked on sit<>stands at the sink with max+2, while +2 assisted with washing buttocks and changing brief in standing. UB bathing/dressing with verbal cues to integrate L UE, but then pt able to push and pull wash cloth across body. Pt with poor carryover of hemi -dressing techniques needing max A and verbal cues. Pt continued to have poor awareness of L side of body as well as pusher tendencies in standing. Used mirror feedback and NDT techniques to facilitation midline. Pt left seated in wc with alarm belt on, call bell in reach, and needs met. See details below for details regarding O2 needs.  2L semi-reclined in bed 97% Seated EOB on 2L 98% RA at EOB: 94% After stedy transfer on RA 91% OT left pt room air after a few sit<>stands, SpO2 down to to 86%.  Pt left on RA and encouraged with deep breathing techniques, SPO2 did not recover above 88%. Pt placed back on 2L of O2.  Therapy Documentation Precautions:  Precautions Precautions: Fall Precaution Comments: BP goal 130-160 per neuro MD; Dense L  side deficits Other Brace: AFO for LLE Restrictions Weight Bearing Restrictions: No  Pain: Pain Assessment Pain Scale: 0-10 Pain Score: 8  Pain Location: Shoulder Pain Orientation: Right Pain Intervention(s): Nursing administered meds   Therapy/Group: Individual Therapy  Valma Cava 03/11/2021, 9:42 AM

## 2021-03-11 NOTE — Patient Care Conference (Signed)
Inpatient RehabilitationTeam Conference and Plan of Care Update Date: 03/11/2021   Time: 10:32 AM    Patient Name: Brent Young      Medical Record Number: [id number]  Date of Birth: [birth year] Sex: Male         Room/Bed: 4W11C/4W11C-01 Payor Info: Payor: MEDICAID [place] / Plan: MEDICAID [place]-[employer]AMILY [employer]ANNING / Product Type: *No Product type* /    Admit Date/Time:  02/27/2021  3:04 PM  Primary Diagnosis:  Infarction of right basal ganglia [facility])  Hospital Problems: Principal Problem:   Infarction of right basal ganglia [facility])    Expected Discharge Date: Expected Discharge Date: 03/28/21  Team Members Present: Physician leading conference: Dr. Faith Rogue Social Worker Present: [name], LCSWA Nurse Present: Kennyth Arnold, RN PT Present: Malachi Pro, PT OT Present: Kearney Hard, OT SLP Present: Feliberto Gottron, SLP PPS Coordinator present : Fae Pippin, SLP     Current Status/Progress Goal Weekly Team Focus  Bowel/Bladder   continent of b/b; LBM: 12/17  regain regular bowel program  assist with tolieting needs prn   Swallow/Nutrition/ Hydration   Regular textures with thin liquids, Mod I-Intermittent supervision for use of compensatory strategies  Mod I  tolernace of current diet, increased use of compensatory strategies   ADL's   Total A +2 lower body ADLs, Mod/max A UB ADLs, Max +2 Stedy transfer, pusher to the L  Minimal assistance overall, moderate assistance for tub shower transfer  L UE NMR, transfers, self-care retraining, activity tolerance, balance training   Mobility   maxA to totalA for all mobility, sliding board transfers, occasional modA/maxA for squat pivot/lateral transfer  minA, probably WC level  improved consistency, attention to task, trnasfers NMR for L hemibody, motor planning   Communication             Safety/Cognition/ Behavioral Observations            Pain   c/o occassionally to R shoulder; PRN tylenol  pain level <3/10  assess pain  QS and prn   Skin   healed abrasion to L knee  remain free of new skin breakdown/infection  assess skin QS and prn     Discharge Planning:  Pt is uninsured. Pt screened by financial counselors. MCD pending DDS eligibility. D/c to home with his son Brent Young being primary caregiver.   Team Discussion: Patient is coming along well. Bowels are now moving. Discontinued oxygen. Incontinent bowel, 8/10 pain reported, and skin tear to RLE healed. Family education will be in January 2023. Slow going progress, still pushing. Has poor body awareness. Family does have portable ramp. Some motor return in right leg but it is not functional return. Very distractible. Max assit +2 for transfers. Friday was coughing with everything, unsure of triggers.  Patient on target to meet rehab goals: Min assist overall goals  *See Care Plan and progress notes for long and short-term goals.   Revisions to Treatment Plan:  Adjusting medications  Teaching Needs: Family education, medication/pain management, skin/wound care, safety awareness, bowel management, transfer/gait training, etc.  Current Barriers to Discharge: Decreased caregiver support, Home enviroment access/layout, Incontinence, Wound care, Lack of/limited family support, Insurance for SNF coverage, Weight, Medication compliance, and mobility, and oxygen.  Possible Resolutions to Barriers: Family education Follow-up PT/OT/SLP Order recommended DME Teach proper breathing techniques     Medical Summary Current Status: still requiring oxygen, trying to wean. motor return Left side is gradual. bp better controlled. still with pain control issues  Barriers to Discharge: Medical stability  Possible Resolutions to [name]: daily assessment of labs and pt data, trying to wean oxygen   Continued Need for Acute Rehabilitation Level of Care: The patient requires daily medical management by a physician with specialized training in physical  medicine and rehabilitation for the following reasons: Direction of a multidisciplinary physical rehabilitation program to maximize functional independence : Yes Medical management of patient stability for increased activity during participation in an intensive rehabilitation regime.: Yes Analysis of laboratory values and/or radiology reports with any subsequent need for medication adjustment and/or medical intervention. : Yes   I attest that I was present, lead the team conference, and concur with the assessment and plan of the team.   Tennis Must 03/11/2021, 2:53 PM

## 2021-03-11 NOTE — Progress Notes (Signed)
Speech Language Pathology Daily Session Note  Patient Details  Name: Brent Young MRN: [id number] Date of Birth: 1961-01-08  Today's Date: 03/11/2021 SLP Individual Time: 0705-0745 SLP Individual Time Calculation (min): 40 min  Short Term Goals: Week 2: SLP Short Term Goal 1 (Week 2): Patient will consume current diet with minimal overt s/s of aspiration with Mod I for use of swallowing compensatory strategies. SLP Short Term Goal 2 (Week 2): Patient will demonstrate efficient mastication with complete oral clearance without overt s/s of aspiration with trials of regular textures over 2 sessions prior to upgrade with Mod I.  Skilled Therapeutic Interventions: Skilled treatment session focused on dysphagia goals. SLP facilitated session by providing skilled observation with breakfast meal of regular textures with thin liquids. Patient appeared lethargic throughout meal with intermittent verbal cues needed to maintain arousal. Patient demonstrated efficient mastication with complete oral clearance with solid textures. Patient with coughing on first sip of thin liquids but no other s/s of aspiration observed throughout meal. Recommend patient continue current diet. Patient reporting increased coughing on water with ice. Educated on mixed consistencies and possibility of tempeture impacting overall swallow function. Patient verbalized understanding. Patient observed with water at room tempeture without overt s/s of aspiration. Patient left upright in bed with alarm on and all needs within reach. Continue with current plan of care.      Pain No/Denies Pain   Therapy/Group: Individual Therapy  [name] 03/11/2021, 8:08 AM

## 2021-03-11 NOTE — Progress Notes (Signed)
Physical Therapy Session Note  Patient Details  Name: Brent Young MRN: [id number] Date of Birth: 05/11/1960  Today's Date: 03/11/2021 PT Co-Treatment Time: 1501-1531 PT Co-Treatment Time Calculation (min): 30 min  Short Term Goals: Week 2:  PT Short Term Goal 1 (Week 2): Pt will maintain static sitting balance with CGA and cues for >5 minutes. PT Short Term Goal 2 (Week 2): Pt will perform sit to stand transfer with modA +2. PT Short Term Goal 3 (Week 2): Pt will perform sliding board transfer with modA +1.  Skilled Therapeutic Interventions/Progress Updates:     Skilled Co-treat with OTA. No complaint of pt pain. Sliding board transfer to [facility] with maxA +2 and cues for body mechanics. Same assist for transfer to mat. Pt works on lateral weight shifting in sitting, encouraging WB through L arm initially and then using L arm to reach for targets. OT providing tactile cueing through pelvis, trunk, and proximal upper extremities.   Pt performs transfer training with Stedy. Initially PT guides pt through anterior weight shifting and sequencing of sit to stand, attempting to help pt clear buttocks from bed to develop optimal motor planning for transfer. Pt then performs sit to stand with Stedy and maxA +2, with much improved body mechanics relative to this therapist's previous attempt at [facility] transfer with this pt. Pt maintains standing in stedy with mirror for visual feedback and modA +2 for trunk stability and neutral positioning. PT also providing tactile cueing at L quad and knee for activation and extension. Pt takes extended seated rest break and continues to work on neutral posturing in [place] with mirror and cueing on positioning. Pt performs x2 additional reps of sit to stand with modA +2 from elevated Stedy seat. Pt verbalizing increasing fatigue with corresponding decrease in trunk control, beginning to lean more significantly to the L and forward. Pt uses stedy for transfer back to bed.  MaxA +2 for return to supine. Left with alarm intact and all needs within reach.  Therapy Documentation Precautions:  Precautions Precautions: Fall Precaution Comments: BP goal 130-160 per neuro MD; Dense L side deficits Other Brace: AFO for LLE Restrictions Weight Bearing Restrictions: No   Therapy/Group: Individual Therapy  Beau Fanny, PT, DPT 03/11/2021, 4:00 PM

## 2021-03-11 NOTE — Progress Notes (Signed)
Occupational Therapy Session Note  Patient Details  Name: Brent Young MRN: [id number] Date of Birth: 01-18-1961  Today's Date: 03/11/2021 OT Co-Treatment Time: 1434-1500 OT Co-Treatment Time Calculation (min): 26 min 1500-1530 co treat with PT   Short Term Goals: Week 2:  OT Short Term Goal 1 (Week 2): Pt will be able to roll onto his R side with min A to A caregivers. OT Short Term Goal 2 (Week 2): Pt will be able to sit to EOB with mod A of 1. OT Short Term Goal 3 (Week 2): Pt will be able to rise to stand with max A of 2 to prepare for toileting.  Skilled Therapeutic Interventions/Progress Updates:  Pt greeted supine in bed agreeable to OT/PT co treat. Session focus on functional sit<>stands, functional mobility, dynamic sitting balance, LUE AROM and decreasing overall burden of care. Pt completed supine>sit with MIN A +2 with increased time and effort and MOD verbal cues with pt exiting to L side of bed. Pt completed SB transfer from EOB>w/c with MAX A +2. Pt transported to gym with total A. Additional SB transfer from w/c>EOM with MAX A +2. Worked on dynamic sitting balance with pt instructed to reach out of BOS with RUE with LUE positioned beside pt with pt needing to use LUE to support UB while reaching providing NMR to LUE. Pt needed intermittent MIN A at times for balance d/t posterior lean. Graded task up and had pt reach LUE to PTs shoulder ( who was positioned in front of pt). Pt completed all reaching tasks with overall CGA- MIN A. Good activation noted in LUE when reaching into scapular protraction/retraction 2+/5. Additionally worked on lateral leans to LUE with pt completing x10 reps with CGA. Pt may benefit from K tape next session to stabilize LUE. Worked on shoulder shrugs and encouraged pt to continue to practice to facilitate improved LUE AROM. Also worked on functional sit<>stands from [facility] with pt needing MAX A +2 to stand from EOM with stedy and MOD A +2 to stand from  seat of stedy. Pt reports fatigue post standing, returned pt back to room from stedy, with pt completing additional stand from stedy with MOD A +2. MOD A +2 to return to supine. pt left supine in bed with bed alarm activated and all needs within reach.   Pt on 2L [place] during session with SpO2 >90%, did trial RA with pt desating to 88% but did quickly rebound with seated rest break.      Therapy Documentation Precautions:  Precautions Precautions: Fall Precaution Comments: BP goal 130-160 per neuro MD; Dense L side deficits Other Brace: AFO for LLE Restrictions Weight Bearing Restrictions: No  Pain: no pain reported during session     Therapy/Group: Co-Treatment  Barron Schmid 03/11/2021, 4:03 PM

## 2021-03-11 NOTE — Progress Notes (Signed)
Patient ID: Brent Young, male   DOB: [birth year], 60 y.o.   MRN: [id number]  SW met with pt in room and called pt son Brent Young while in room to provide updates from team conference, and no change in d/c date. SW discussed there will be efforts made to get him off oxygen. SW explained that this can be a costly expense if needed at discharge. Brent Young intends to speak with his aunt about coming in for family edu next week Wednesday if possible. SW will follow-up and will confirm no barriers to discharge.   [name], MSW, [place] Office: [phone] Cell: [phone] Fax: [phone]

## 2021-03-12 ENCOUNTER — Inpatient Hospital Stay (HOSPITAL_COMMUNITY): Payer: Medicaid Other

## 2021-03-12 LAB — GLUCOSE, CAPILLARY
Glucose-Capillary: 96 mg/dL (ref 70–99)
Glucose-Capillary: 109 mg/dL — ABNORMAL HIGH (ref 70–99)
Glucose-Capillary: 104 mg/dL — ABNORMAL HIGH (ref 70–99)

## 2021-03-12 MED ORDER — PANTOPRAZOLE SODIUM 40 MG PO TBEC
40.0000 mg | DELAYED_RELEASE_TABLET | Freq: Every day | ORAL | Status: DC
  Administered 2021-03-12 – 2021-04-03 (×23): 40 mg via ORAL
  Filled 2021-03-12 (×23): qty 1

## 2021-03-12 MED ORDER — DICLOFENAC SODIUM 1 % EX GEL
2.0000 g | Freq: Four times a day (QID) | CUTANEOUS | Status: DC
  Administered 2021-03-12 – 2021-04-03 (×70): 2 g via TOPICAL
  Filled 2021-03-12 (×2): qty 100

## 2021-03-12 NOTE — Progress Notes (Signed)
Skin; removed wound to right pretibial as skin tear. Pt has no skin tear, only has multiple abrasions to both right and left knees as well as arms. Pt reports most are from previous falls at home.

## 2021-03-12 NOTE — Progress Notes (Signed)
Physical Therapy Session Note  Patient Details  Name: [name] MRN: [id number] Date of Birth: [birth year]  Today's Date: 03/12/2021 PT Individual Time: 1300-1320 PT Individual Time Calculation (min): 20 min; missed 40 mins due to pain  Short Term Goals: Week 2:  PT Short Term Goal 1 (Week 2): Pt will maintain static sitting balance with CGA and cues for >5 minutes. PT Short Term Goal 2 (Week 2): Pt will perform sit to stand transfer with modA +2. PT Short Term Goal 3 (Week 2): Pt will perform sliding board transfer with modA +1.  Skilled Therapeutic Interventions/Progress Updates:    Patient received reclined in bed, son at bedside. He reports pain in L knee/ankle related to fall earlier today- he did not rate. Premedicated. PT providing distractions and repositioning to assist with pain management. He declined participation in PT at this time due to soreness from fall. PT assisting patient with repositioning in bed. He required TOtalA to roll R and MaxA to roll L to replace chuck pad and place pillow under bottom to assist with pressure relief. Patient remaining in bed, bed alarm on, call light within reach.   Therapy Documentation Precautions:  Precautions Precautions: Fall Precaution Comments: BP goal 130-160 per neuro MD; Dense L side deficits Other Brace: AFO for LLE Restrictions Weight Bearing Restrictions: No     Therapy/Group: Individual Therapy  Elizebeth Koller, PT, DPT, CBIS  03/12/2021, 7:38 AM

## 2021-03-12 NOTE — Progress Notes (Signed)
03/12/21 1005  What Happened  Was fall witnessed? No  Was patient injured? No  Patient found on floor  Found by Staff-comment (found on floor by NT)  Stated prior activity other (comment) (slid out of wheelchair)  Follow Up  MD notified P.Love, PA  Time MD notified 27  Family notified Yes - comment  Time family notified 40 (family at bedside)  Additional tests No  Simple treatment Ice (Vaotaren gel)  Progress note created (see row info) Yes  Adult Fall Risk Assessment  Risk Factor Category (scoring not indicated) History of more than one fall within 6 months before admission (document High fall risk);Fall has occurred during this admission (document High fall risk)  Age 60  Fall History: Fall within 6 months prior to admission 5  Elimination; Bowel and/or Urine Incontinence 0  Elimination; Bowel and/or Urine Urgency/Frequency 0  Medications: includes PCA/Opiates, Anti-convulsants, Anti-hypertensives, Diuretics, Hypnotics, Laxatives, Sedatives, and Psychotropics 5  Patient Care Equipment 1  Mobility-Assistance 2  Mobility-Gait 2  Mobility-Sensory Deficit 0  Altered awareness of immediate physical environment 0  Impulsiveness 0  Lack of understanding of one's physical/cognitive limitations 0  Total Score 16  Patient Fall Risk Level High fall risk  Adult Fall Risk Interventions  Required Bundle Interventions *See Row Information* High fall risk - low, moderate, and high requirements implemented  Additional Interventions Use of appropriate toileting equipment (bedpan, BSC, etc.);PT/OT need assessed if change in mobility from baseline;Lap belt while in chair/wheelchair (Rehab only);Family Supervision  Screening for Fall Injury Risk (To be completed on HIGH fall risk patients) - Assessing Need for Floor Mats  Risk For Fall Injury- Criteria for Floor Mats Admitted as a result of a fall  Will Implement Floor Mats Yes  Vitals  Temp 98.1 F (36.7 C)  Temp Source Oral  BP (!)  141/81  MAP (mmHg) 96  BP Location Right Leg  BP Method Automatic  Patient Position (if appropriate) Lying  Pulse Rate 99  Pulse Rate Source Monitor  Resp (!) 22  Oxygen Therapy  SpO2 (!) 88 %  O2 Device Room Air  Pain Assessment  Pain Scale 0-10  Pain Score 6  Pain Type Acute pain  Pain Location Knee  Pain Orientation Left  Pain Descriptors / Indicators Aching;Discomfort;Sore  Pain Frequency Intermittent  Pain Onset On-going  Pain Intervention(s) Cold applied;Repositioned  Neurological  Neuro (WDL) WDL  Level of Consciousness Alert  Orientation Level Oriented X4  Cognition Follows commands  Speech Clear  Pupil Assessment  No  R Hand Grip Moderate  L Hand Grip Weak   RUE Motor Response Purposeful movement  RUE Sensation Full sensation  LUE Motor Response Purposeful movement  LUE Sensation Tingling;Numbness;Decreased  LUE Motor Strength 2  RLE Motor Response Purposeful movement  RLE Sensation Full sensation  LLE Motor Response Purposeful movement  LLE Sensation Decreased  LLE Motor Strength 2  Neuro Symptoms None  Glasgow Coma Scale  Eye Opening 4  Best Verbal Response (NON-intubated) 5  Best Motor Response 6  Glasgow Coma Scale Score 15  Musculoskeletal  Musculoskeletal (WDL) X  Assistive Device MaxiMove;Wheelchair;BSC  Generalized Weakness Yes  Weight Bearing Restrictions No  Musculoskeletal Details  LUE Weakness  LLE Weakness  LLE Ortho/Supportive Device AFO (prafo boot)  Integumentary  Integumentary (WDL) X  Skin Color Appropriate for ethnicity  Skin Condition Dry  Abrasion Location Knee  Abrasion Location Orientation Right;Left  Abrasion Intervention  (assessed)  Skin Turgor Non-tenting  Pain Assessment  Date Pain First  Started 03/12/21  Result of Injury Yes  Pain Assessment  Work-Related Injury No  Pain Screening  Clinical Progression Gradually improving (will be sore tomorrow)

## 2021-03-12 NOTE — Progress Notes (Signed)
PROGRESS NOTE   Subjective/Complaints:  Had a reasonable night. Feels that he is progressing. Says he's working hard  ROS: Patient denies fever, rash, sore throat, blurred vision, nausea, vomiting, diarrhea, cough, shortness of breath or chest pain,  headache, or mood change.   Objective:   DG Chest 2 View  Result Date: 03/11/2021 CLINICAL DATA:  Hypoxia EXAM: CHEST - 2 VIEW COMPARISON:  02/27/2021 FINDINGS: Cardiac shadow is enlarged but stable. Lungs are well aerated bilaterally. Chronic linear densities are noted in the bases bilaterally left greater than right similar to that seen on the prior exam. No new focal abnormality is noted. IMPRESSION: No significant change from the prior study. Electronically Signed   By: Alcide Clever M.D.   On: 03/11/2021 17:44   DG Swallowing Func-Speech Pathology  Result Date: 03/10/2021 Table formatting from the original result was not included. Objective Swallowing Evaluation: Type of Study: MBS-Modified Barium Swallow Study  Patient Details Name: Brent Young MRN: [id number] Date of Birth: [birth year] Today's Date: 03/10/2021 Past Medical History: Past Medical History: Diagnosis Date  Allergy   Arthritis   Diabetes mellitus without complication (HCC)   Hypertension   Stroke [facility])  Past Surgical History: Past Surgical History: Procedure Laterality Date  FRACTURE SURGERY Left   Patient fractured left arm  IR ANGIO INTRA EXTRACRAN SEL COM CAROTID INNOMINATE UNI L MOD SED  02/19/2021  IR ANGIO INTRA EXTRACRAN SEL INTERNAL CAROTID UNI R MOD SED  02/19/2021  IR ANGIO VERTEBRAL SEL SUBCLAVIAN INNOMINATE UNI R MOD SED  02/19/2021  IR ANGIO VERTEBRAL SEL VERTEBRAL UNI L MOD SED  02/19/2021  IR US GUIDE VASC ACCESS RIGHT  02/19/2021 HPI: See H&P  Subjective: Pt seated upright in bed. No family present  Recommendations for follow up therapy are one component of a multi-disciplinary discharge planning process, led  by the attending physician.  Recommendations may be updated based on patient status, additional functional criteria and insurance authorization. Assessment / Plan / Recommendation Clinical Impressions 03/10/2021 Clinical Impression Patient demonstrates a mild oropharyngeal characterized by decreased bolus cohesion with premature spillage to the pyriform sinuses with thin liquids resulting in intermittent flash penetration. No aspiration noted despite large, sequential sips. Recommend patient upgrade to regular textures with thin liquids. SLP Visit Diagnosis Dysphagia, oropharyngeal phase (R13.12) Attention and concentration deficit following -- Frontal lobe and executive function deficit following -- Impact on safety and function Mild aspiration risk   Treatment Recommendations 03/10/2021 Treatment Recommendations Therapy as outlined in treatment plan below   Prognosis 03/10/2021 Prognosis for Safe Diet Advancement Good Barriers to Reach Goals -- Barriers/Prognosis Comment -- Diet Recommendations 03/10/2021 SLP Diet Recommendations Regular solids;Thin liquid Liquid Administration via Cup Medication Administration Whole meds with puree Compensations Slow rate;Small sips/bites;Minimize environmental distractions Postural Changes --   Other Recommendations 03/10/2021 Recommended Consults -- Oral Care Recommendations Oral care BID Other Recommendations -- Follow Up Recommendations No SLP follow up Assistance recommended at discharge None Functional Status Assessment -- Frequency and Duration  03/10/2021 Speech Therapy Frequency (ACUTE ONLY) min 3x week Treatment Duration 1 week   Oral Phase 03/10/2021 Oral Phase Impaired Oral - Pudding Teaspoon -- Oral - Pudding Cup -- Oral - Honey  Teaspoon -- Oral - Honey Cup -- Oral - Nectar Teaspoon -- Oral - Nectar Cup -- Oral - Nectar Straw -- Oral - Thin Teaspoon -- Oral - Thin Cup Decreased bolus cohesion;Premature spillage Oral - Thin Straw Decreased bolus cohesion;Premature  spillage Oral - Puree -- Oral - Mech Soft -- Oral - Regular WFL Oral - Multi-Consistency -- Oral - Pill -- Oral Phase - Comment --  Pharyngeal Phase 03/10/2021 Pharyngeal Phase Impaired Pharyngeal- Pudding Teaspoon -- Pharyngeal -- Pharyngeal- Pudding Cup -- Pharyngeal -- Pharyngeal- Honey Teaspoon -- Pharyngeal -- Pharyngeal- Honey Cup -- Pharyngeal -- Pharyngeal- Nectar Teaspoon -- Pharyngeal -- Pharyngeal- Nectar Cup -- Pharyngeal -- Pharyngeal- Nectar Straw -- Pharyngeal -- Pharyngeal- Thin Teaspoon -- Pharyngeal -- Pharyngeal- Thin Cup Delayed swallow initiation-vallecula;Penetration/Aspiration during swallow Pharyngeal Material enters airway, remains ABOVE vocal cords then ejected out Pharyngeal- Thin Straw Delayed swallow initiation-vallecula;Penetration/Aspiration during swallow Pharyngeal Material enters airway, remains ABOVE vocal cords then ejected out Pharyngeal- Puree -- Pharyngeal -- Pharyngeal- Mechanical Soft -- Pharyngeal -- Pharyngeal- Regular Delayed swallow initiation-vallecula Pharyngeal -- Pharyngeal- Multi-consistency -- Pharyngeal -- Pharyngeal- Pill -- Pharyngeal -- Pharyngeal Comment --  No flowsheet data found. PAYNE, COURTNEY 03/10/2021, 12:14 PM    Feliberto Gottron, MA, CCC-SLP                  No results for input(s): WBC, HGB, HCT, PLT in the last 72 hours.  No results for input(s): NA, K, CL, CO2, GLUCOSE, BUN, CREATININE, CALCIUM in the last 72 hours.    Intake/Output Summary (Last 24 hours) at 03/12/2021 0820 Last data filed at 03/12/2021 0804 Gross per 24 hour  Intake 320 ml  Output 500 ml  Net -180 ml        Physical Exam: Vital Signs Blood pressure (!) 151/81, pulse 95, temperature 97.9 F (36.6 C), temperature source Oral, resp. rate 14, height  (1.854 m), weight 117.9 kg, SpO2 94 %.   Constitutional: No distress . Vital signs reviewed. HEENT: NCAT, EOMI, oral membranes moist Neck: supple Cardiovascular: RRR without murmur. No JVD     Respiratory/Chest: CTA Bilaterally without wheezes or rales. Normal effort    GI/Abdomen: BS +, non-tender, non-distended Ext: no clubbing, cyanosis, or edema Psych: pleasant and cooperative  Skin: No evidence of breakdown, no evidence of rash Neuro:  Pt is alert and oriented x 3. Mild left central VII, mild left hemi-facial sensory loss. Reasonable insight, awareness and memory. Normal language, mild dysarthria. LUE 2+ deltoid, 3- biceps, tricep,   3 wrist/ HI-motor exam stable. Marland Kitchen LLE:   1+ to 2 HF, KE- , 0/5 distally-no change.  Left arm and leg 1/2 light touch. Still No resting tone.  Musculoskeletal: LLE in PRAFO, actually in good neutral position. Right shoulder pain+   Assessment/Plan: 1. Functional deficits which require 3+ hours per day of interdisciplinary therapy in a comprehensive inpatient rehab setting. Physiatrist is providing close team supervision and 24 hour management of active medical problems listed below. Physiatrist and rehab team continue to assess barriers to discharge/monitor patient progress toward functional and medical goals  Care Tool:  Bathing    Body parts bathed by patient: Chest, Abdomen, Front perineal area, Face, Left arm, Right upper leg, Left upper leg   Body parts bathed by helper: Right lower leg, Left lower leg, Right arm, Front perineal area, Buttocks     Bathing assist Assist Level: Maximal Assistance - Patient 24 - 49%     Upper Body Dressing/Undressing Upper body dressing  What is the patient wearing?: Pull over shirt    Upper body assist Assist Level: Maximal Assistance - Patient 25 - 49%    Lower Body Dressing/Undressing Lower body dressing      What is the patient wearing?: Pants     Lower body assist Assist for lower body dressing: Maximal Assistance - Patient 25 - 49%     Toileting Toileting    Toileting assist Assist for toileting: 2 Helpers     Transfers Chair/bed transfer  Transfers assist  Chair/bed transfer  activity did not occur: Safety/medical concerns  Chair/bed transfer assist level: 2 Helpers     Locomotion Ambulation   Ambulation assist   Ambulation activity did not occur: Safety/medical concerns          Walk 10 feet activity   Assist  Walk 10 feet activity did not occur: Safety/medical concerns        Walk 50 feet activity   Assist Walk 50 feet with 2 turns activity did not occur: Safety/medical concerns         Walk 150 feet activity   Assist Walk 150 feet activity did not occur: Safety/medical concerns         Walk 10 feet on uneven surface  activity   Assist Walk 10 feet on uneven surfaces activity did not occur: Safety/medical concerns         Wheelchair     Assist Is the patient using a wheelchair?: Yes Type of Wheelchair: Manual Wheelchair activity did not occur: Safety/medical concerns         Wheelchair 50 feet with 2 turns activity    Assist    Wheelchair 50 feet with 2 turns activity did not occur: Safety/medical concerns       Wheelchair 150 feet activity     Assist  Wheelchair 150 feet activity did not occur: Safety/medical concerns       Blood pressure (!) 151/81, pulse 95, temperature 97.9 F (36.6 C), temperature source Oral, resp. rate 14, height 6\' 1"  (1.854 m), weight 117.9 kg, SpO2 94 %.  Medical Problem List and Plan: 1. 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  Antithrombotics: -DVT/anticoagulation:  Pharmaceutical: Lovenox             -antiplatelet therapy: Aspirin 325 mg daily and Plavix 75 mg daily x3 months then aspirin alone 3. 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 Mood:    doing well!             -antipsychotic agents: N/A 5. Neuropsych: This patient is capable of making decisions on his own behalf. 6. Skin/Wound Care: local care as needed 7. Fluids/Electrolytes/Nutrition: Routine in and out with follow-up chemistries 8.  Dysphagia.  after MBS advanced to regular thins  -needs to take his time, small sips 9.  Hypertension.  Norvasc 10 mg daily Avapro 75 mg daily, Toprol-XL 25 mg daily  12/21- BP controlled continue  regimen 10.  Diabetes mellitus.  Hemoglobin A1c 9.9.  SSI, Tradjenta 5 mg daily, Glucophage 1000 mg twice daily.   CBG (last 3)  Recent Labs    03/11/21 1656 03/11/21 2121 03/12/21 0635  GLUCAP 129* 115* 96   12/21 reasonably controlled  11.  Hyperlipidemia.  Lipitor 12.  Tobacco use.  Counseling 13.  Seizure prophylaxis.  Trileptal 300 mg 3 times daily 14. Constipation-  -moving bowels-continue senna-s at HS as mtc 15. Pulmonary edema/hypoxia- has been using 2L O2 as needed- more often than not--can't seem to wean him thus far. -CXR 12/8 shows mild pulmonary edema. Chest clear on exam 12/21 FV, IS, have dc'ed oxygen, encourage use of FV/IS for lower sats  -cxr yesterday stable in appearance (no edema/infiltrates)  -discussed with RN today 16. Dizziness? Resolved--- Likely d/t oxycodone which was dc'ed     LOS: 13 days A FACE TO FACE EVALUATION WAS PERFORMED  Ranelle Oyster 03/12/2021, 8:20 AM

## 2021-03-12 NOTE — Progress Notes (Signed)
Physical Therapy Session Note  Patient Details  Name: Brent Young MRN: [id number] Date of Birth: 1961/01/17  Today's Date: 03/12/2021 PT Individual Time: 0804-0906 PT Individual Time Calculation (min): 62 min   Short Term Goals: Week 2:  PT Short Term Goal 1 (Week 2): Pt will maintain static sitting balance with CGA and cues for >5 minutes. PT Short Term Goal 2 (Week 2): Pt will perform sit to stand transfer with modA +2. PT Short Term Goal 3 (Week 2): Pt will perform sliding board transfer with modA +1.  Skilled Therapeutic Interventions/Progress Updates:     Pt received supine in bed and agrees to therapy. PT assists with donning bilateral ted hose and shorts prior to mobility. Supine to sit with modA/maxA and cues for sequencing and positioning. Pt performs sliding board transfer to [name] with maxA +2 and cues for body mechanics, with tactile cueing for head hips relationship. WC transport to gym for time management. Sliding board transfer to mat table with same level of assistance and cueing. Pt performs sit to stand transfer into [name] with maxA +2, with cues for anterior trunk lean and weight shift, initiation, glute engagement, and trunk extension. In standing PT provides tactile cueing at L quad to facilitate contraction via NM feedback, with pt able to elicit palpable contractions. Mirror provided for visual feedback. From elevated height of [place] seat, pt performs multiple reps of sit to stand, with as little as modA +2 to complete when using optimal body mechanics and sequencing. Pt practices achieving neutral posture in standing and utilizes bilateral upper extremities to provide stability and NM feedback through WB though L upper extremity. Pt transported back to room via stedy to perform transfer onto toilet with totalA. Pt is continent of bowel. Sit to stand from toilet to stedy with maxA +2. Pt remains standing with maxA +1 while PT provides pericare. Pt left seated in WC with alarm  intact and all needs within reach.  Therapy Documentation Precautions:  Precautions Precautions: Fall Precaution Comments: BP goal 130-160 per neuro MD; Dense L side deficits Other Brace: AFO for LLE Restrictions Weight Bearing Restrictions: No General: PT Amount of Missed Time (min): 40 Minutes PT Missed Treatment Reason: Pain   Therapy/Group: Individual Therapy  Beau Fanny, PT, DPT 03/12/2021, 4:01 PM

## 2021-03-12 NOTE — Progress Notes (Signed)
Speech Language Pathology Discharge Summary  Patient Details  Name: Brent Young MRN: [id number] Date of Birth: 05-Oct-1960  Today's Date: 03/12/2021 SLP Individual Time: 1937-9024 SLP Individual Time Calculation (min): 30 min   Skilled Therapeutic Interventions:  Skilled treatment session focused on dysphagia goals. SLP facilitated session by providing skilled observation with breakfast meal of regular textures with thin liquids. Patient consumed meal without overt s/s of aspiration and was overall Mod I for use of swallowing compensatory strategies. Patient has met all LTGs at this time, therefore, he will be discharged from skilled SLP intervention. Patient verbalized understanding and agreement. Patient left upright in bed with alarm on and all needs within reach.    Patient has met 3 of 3 long term goals.  Patient to discharge at overall Modified Independent level.   Reasons goals not met: N/A   Clinical Impression/Discharge Summary: Patient has made excellent gains and has met 3 of 3 LTGs this admission. Currently, patient is consuming regular textures with thin liquids with minimal overt s/s of aspiration and is overall Mod I for use of swallowing compensatory strategies. Patient has met all LTGs at this time and all education is complete, therefore, patient will be discharged form skilled SLP intervention with f/u not warranted a this time. Patient verbalized understanding and agreement.   Care Partner:  Caregiver Able to Provide Assistance: Yes     Recommendation:  None      Equipment: N/A   Reasons for discharge: Treatment goals met   Patient/Family Agrees with Progress Made and Goals Achieved: Yes    Theodore Rahrig, [place] 03/12/2021, 7:17 AM

## 2021-03-12 NOTE — Progress Notes (Signed)
Occupational Therapy Session Note  Patient Details  Name: Brent Young MRN: [id number] Date of Birth: 21-Jul-1960  Today's Date: 03/12/2021 OT Individual Time: 1100-1155 OT Individual Time Calculation (min): 55 min    Short Term Goals: Week 2:  OT Short Term Goal 1 (Week 2): Pt will be able to roll onto his R side with min A to A caregivers. OT Short Term Goal 2 (Week 2): Pt will be able to sit to EOB with mod A of 1. OT Short Term Goal 3 (Week 2): Pt will be able to rise to stand with max A of 2 to prepare for toileting.  Skilled Therapeutic Interventions/Progress Updates:    Pt resting in bed upon arrival with son present. Ice on Lt knee. Pt reported that he slid from w/c earlier in AM; verified by RN. PT declined getting OOB. Educated pt on proper positioning when sitting in w/c. Pt has TIS w/c and must be positioned at 45*. Pt c/o Lt shoulder pain and small sublux noted. Kinesio Tape applied for positioning and pain mgmt. Pt with improved LUE AROM/strength. Engaged in LUE therex. Educated pt on LUE positioning when in bed for edema mgmt. Note placed on bulletin board regarding w/c positioning. Encouraged pt to get OOB into w/c in therapies. Encouraged pt to advocate for himself regarding w/c positioning. Pt remained in bed with all needs within reach. Bed alarm activated. Son present.  Therapy Documentation Precautions:  Precautions Precautions: Fall Precaution Comments: BP goal 130-160 per neuro MD; Dense L side deficits Other Brace: AFO for LLE Restrictions Weight Bearing Restrictions: (P) No Pain: Pain Assessment Pain Scale: 0-10 Pain Score: 6  Pain Type: Acute pain Pain Location: Knee Pain Orientation: Left Pain Descriptors / Indicators: Aching;Discomfort;Sore Pain Frequency: Intermittent Pain Onset: On-going Pain Intervention(s): Cold applied;Repositioned  Therapy/Group: Individual Therapy  Rich Brave 03/12/2021, 12:00 PM

## 2021-03-13 LAB — GLUCOSE, CAPILLARY
Glucose-Capillary: 103 mg/dL — ABNORMAL HIGH (ref 70–99)
Glucose-Capillary: 112 mg/dL — ABNORMAL HIGH (ref 70–99)
Glucose-Capillary: 100 mg/dL — ABNORMAL HIGH (ref 70–99)

## 2021-03-13 LAB — CREATININE, SERUM
Creatinine, Ser: 0.67 mg/dL (ref 0.61–1.24)
GFR, Estimated: >60 mL/min (ref >60)

## 2021-03-13 NOTE — Progress Notes (Signed)
PROGRESS NOTE   Subjective/Complaints:  Pt a little upset about nursing response last night. Was requesting to be turned/moved frequently. Otherwise dong well. Right shoulder, some back pain this morning. Asked about knee xray from yesterday which was done when he slid out of w/c yesterday  ROS: Patient denies fever, rash, sore throat, blurred vision, nausea, vomiting, diarrhea, cough, shortness of breath or chest pain,  headache, or mood change. .   Objective:   DG Chest 2 View  Result Date: 03/11/2021 CLINICAL DATA:  Hypoxia EXAM: CHEST - 2 VIEW COMPARISON:  02/27/2021 FINDINGS: Cardiac shadow is enlarged but stable. Lungs are well aerated bilaterally. Chronic linear densities are noted in the bases bilaterally left greater than right similar to that seen on the prior exam. No new focal abnormality is noted. IMPRESSION: No significant change from the prior study. Electronically Signed   By: Alcide Clever M.D.   On: 03/11/2021 17:44   DG Knee Complete 4 Views Left  Result Date: 03/12/2021 CLINICAL DATA:  Patient fell getting into wheelchair today, pain in left knee. Patient had to be held for images due to stroke. EXAM: LEFT KNEE - COMPLETE 4+ VIEW COMPARISON:  X-ray left knee 02/16/2021 FINDINGS: No evidence of fracture, dislocation, or joint effusion. No evidence of severe arthropathy. No aggressive appearing focal bone abnormality. Soft tissues are unremarkable. Vascular calcifications. IMPRESSION: No acute displaced fracture or dislocation. Electronically Signed   By: Tish Frederickson M.D.   On: 03/12/2021 18:56   No results for input(s): WBC, HGB, HCT, PLT in the last 72 hours.  Recent Labs    03/13/21 0515  CREATININE 0.67      Intake/Output Summary (Last 24 hours) at 03/13/2021 1026 Last data filed at 03/12/2021 2051 Gross per 24 hour  Intake 360 ml  Output 600 ml  Net -240 ml        Physical Exam: Vital  Signs Blood pressure (!) 151/83, pulse 100, temperature 98.3 F (36.8 C), temperature source Oral, resp. rate 18, height 6\' 1"  (1.854 m), weight 117.9 kg, SpO2 92 %.   Constitutional: No distress . Vital signs reviewed. HEENT: NCAT, EOMI, oral membranes moist Neck: supple Cardiovascular: RRR without murmur. No JVD    Respiratory/Chest: CTA Bilaterally without wheezes or rales. Normal effort    GI/Abdomen: BS +, non-tender, non-distended Ext: no clubbing, cyanosis, or edema Psych: pleasant and cooperative  Skin: wound on left knee, no evidence of rash Neuro:  Pt is alert and oriented x 3. Mild left central VII, mild left hemi-facial sensory loss. Reasonable insight, awareness and memory. Normal language, mild dysarthria. LUE 2+ deltoid, 3- biceps, tricep,   3 wrist/ HI-motor exam stable. LLE:   1+ to 2 HF, KE- , 0/5 distally-no change.  Left arm and leg 1/2 light touch. Still No resting tone.--stable  Musculoskeletal: left knee without tenderness or effusion. Right shoulder pain   Assessment/Plan: 1. Functional deficits which require 3+ hours per day of interdisciplinary therapy in a comprehensive inpatient rehab setting. Physiatrist is providing close team supervision and 24 hour management of active medical problems listed below. Physiatrist and rehab team continue to assess barriers to discharge/monitor patient progress toward functional  and medical goals  Care Tool:  Bathing    Body parts bathed by patient: Chest, Abdomen, Front perineal area, Face, Left arm, Right upper leg, Left upper leg   Body parts bathed by helper: Right lower leg, Left lower leg, Right arm, Front perineal area, Buttocks     Bathing assist Assist Level: Maximal Assistance - Patient 24 - 49%     Upper Body Dressing/Undressing Upper body dressing   What is the patient wearing?: Pull over shirt    Upper body assist Assist Level: Maximal Assistance - Patient 25 - 49%    Lower Body  Dressing/Undressing Lower body dressing      What is the patient wearing?: Pants     Lower body assist Assist for lower body dressing: Maximal Assistance - Patient 25 - 49%     Toileting Toileting    Toileting assist Assist for toileting: 2 Helpers     Transfers Chair/bed transfer  Transfers assist  Chair/bed transfer activity did not occur: Safety/medical concerns  Chair/bed transfer assist level: 2 Helpers     Locomotion Ambulation   Ambulation assist   Ambulation activity did not occur: Safety/medical concerns          Walk 10 feet activity   Assist  Walk 10 feet activity did not occur: Safety/medical concerns        Walk 50 feet activity   Assist Walk 50 feet with 2 turns activity did not occur: Safety/medical concerns         Walk 150 feet activity   Assist Walk 150 feet activity did not occur: Safety/medical concerns         Walk 10 feet on uneven surface  activity   Assist Walk 10 feet on uneven surfaces activity did not occur: Safety/medical concerns         Wheelchair     Assist Is the patient using a wheelchair?: Yes Type of Wheelchair: Manual Wheelchair activity did not occur: Safety/medical concerns         Wheelchair 50 feet with 2 turns activity    Assist    Wheelchair 50 feet with 2 turns activity did not occur: Safety/medical concerns       Wheelchair 150 feet activity     Assist  Wheelchair 150 feet activity did not occur: Safety/medical concerns       Blood pressure (!) 151/83, pulse 100, temperature 98.3 F (36.8 C), temperature source Oral, resp. rate 18, height 6\' 1"  (1.854 m), weight 117.9 kg, SpO2 92 %.  Medical Problem List and Plan: 1. 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  Antithrombotics: -DVT/anticoagulation:  Pharmaceutical: Lovenox             -antiplatelet therapy: Aspirin 325 mg daily and Plavix 75 mg daily x3 months then aspirin alone 3. Pain Management: R shoulder pain from fall -Neurontin 300 mg 3 times daily, off oxycodone d/t "loopiness" -ROM, strengthening of R shoulder with therapies  -left knee xray reviewed. No injury. No pain today. Discussed safety with patient today.  4. Mood:   Pt needs regular positive reassurance             -  antipsychotic agents: N/A 5. Neuropsych: This patient is capable of making decisions on his own behalf. 6. Skin/Wound Care: local care as needed 7. Fluids/Electrolytes/Nutrition: Routine in and out with follow-up chemistries 8.  Dysphagia.  after MBS advanced to regular thins  -needs to take his time, small sips 9.  Hypertension.  Norvasc 10 mg daily Avapro 75 mg daily, Toprol-XL 25 mg daily  12/22- BP controlled continue regimen 10.  Diabetes mellitus.  Hemoglobin A1c 9.9.  SSI, Tradjenta 5 mg daily, Glucophage 1000 mg twice daily.   CBG (last 3)  Recent Labs    03/12/21 1205 03/12/21 2111 03/13/21 0631  GLUCAP 109* 104* 103*   12/22  reasonably controlled  11.  Hyperlipidemia.  Lipitor 12.  Tobacco use.  Counseling 13.  Seizure prophylaxis.  Trileptal 300 mg 3 times daily 14. Constipation-  -moving bowels-continue senna-s at HS as mtc 15. Pulmonary edema/hypoxia- has been using 2L O2 as needed- more often than not--can't seem to wean him thus far. -CXR 12/8 shows mild pulmonary edema. Chest clear on exam 12/22 FV, IS. Now off oxygen    16. Dizziness? Resolved--- Likely d/t oxycodone which was dc'ed     LOS: 14 days A FACE TO FACE EVALUATION WAS PERFORMED  Ranelle Oyster 03/13/2021, 10:26 AM

## 2021-03-13 NOTE — Progress Notes (Signed)
Occupational Therapy Session Note  Patient Details  Name: Brent Young MRN: [id number] Date of Birth: Nov 30, 1960  Today's Date: 03/13/2021 OT Individual Time: 0930-1040 OT Individual Time Calculation (min): 70 min    Short Term Goals: Week 2:  OT Short Term Goal 1 (Week 2): Pt will be able to roll onto his R side with min A to A caregivers. OT Short Term Goal 2 (Week 2): Pt will be able to sit to EOB with mod A of 1. OT Short Term Goal 3 (Week 2): Pt will be able to rise to stand with max A of 2 to prepare for toileting.  Skilled Therapeutic Interventions/Progress Updates:    Pt resting in TIS w/c upon arrival. OT intervention with focus on UB bahting/dressing seated in w/c, sit<>stand in [place], standing balance in [place], unsupported sitting balance, and safety awareness to increase independence with BADLs. Pt required min A for doffing/donning pullover shirt. UB bathing with min A and min verbal cues to use LUE to assist with bathing. Unsupported sitting balance with min A. Sit<>stand in [place] with mod A and tactile cues/facilitation to lean forward. Pt with significant posterior lean when standing. Mirror placed in front of pt and pt is able to see his lean but unable to correct without tactile cues/facilitation. Pt transferred to recliner. Belt alarm actrivated. LUE supported on pillows. BLE elevated and seat back recliner. All needs within reach. LPN Josh notified.  Therapy Documentation Precautions:  Precautions Precautions: Fall Precaution Comments: BP goal 130-160 per neuro MD; Dense L side deficits Other Brace: AFO for LLE Restrictions Weight Bearing Restrictions: No  Pain: Pt c/o Lt shoulder discomfort, but improved from previous day; repostiioned and Kinesio Tape reapplied  Therapy/Group: Individual Therapy  Rich Brave 03/13/2021, 10:46 AM

## 2021-03-13 NOTE — Progress Notes (Signed)
Physical Therapy Weekly Progress Note  Patient Details  Name: Brent Young MRN: [id number] Date of Birth: [birth year]  Beginning of progress report period: March 06, 2021 End of progress report period: March 13, 2021  Today's Date: 03/13/2021 PT Individual Time: 0802-0859 and 1497-0263 PT Individual Time Calculation (min): 57 min and 57 min  Patient has met 2 of 3 short term goals.  Pt is progressing toward mobility goals, improving independence with bed mobility, sitting balance, and sit to stand transfers. Pt still routinely requiring maxA +2 for sliding board or squat pivot transfers due to poor motor planning and sequencing as well as L sided hemiplegia. Noted impairments have also prevented pt from initiating gait training at this time, but recent improvements in motor activation of L lower extremity indicate that pt may be close to being able to attempt ambulation. Pt will benefit from family education prior to discharge.  Patient continues to demonstrate the following deficits muscle weakness, decreased cardiorespiratoy endurance, impaired timing and sequencing, decreased coordination, and decreased motor planning, decreased attention to left, and decreased sitting balance, decreased standing balance, decreased postural control, hemiplegia, and decreased balance strategies and therefore will continue to benefit from skilled PT intervention to increase functional independence with mobility.  Patient progressing toward long term goals..  Continue plan of care.  PT Short Term Goals Week 2:  PT Short Term Goal 1 (Week 2): Pt will maintain static sitting balance with CGA and cues for >5 minutes. PT Short Term Goal 1 - Progress (Week 2): Met PT Short Term Goal 2 (Week 2): Pt will perform sit to stand transfer with modA +2. PT Short Term Goal 2 - Progress (Week 2): Met PT Short Term Goal 3 (Week 2): Pt will perform sliding board transfer with modA +1. PT Short Term Goal 3 - Progress  (Week 2): Progressing toward goal Week 3:  PT Short Term Goal 1 (Week 3): Pt will perform bed mobility with modA +1. PT Short Term Goal 2 (Week 3): Pt will perform sit to stand with modA +1. PT Short Term Goal 3 (Week 3): Pt will perform bed to chair transfer with mod A+1. PT Short Term Goal 4 (Week 3): Pt will initiate gait training.  Skilled Therapeutic Interventions/Progress Updates:     Pt received supine in bed and agrees to therapy. Supine to sit with cues on logrolling and sequencing and maxA +1 for sidelying to sit. Pt performs sit to stand with modA/maxA +2 and PT and tech provide totalA to pull up shorts and don shoes. Pt then performs sliding board transfer to [facility] with maxA +2 and cues for body mechanics and initiation. WC transport to gym. Pt performs  squat pivot to mat table with maxA +2 and same multimodal cueing. Majority of session focused on NMR for sitting balance, trunk control, and utilization of upper extremities. Pt positioned with blue platform in between legs. Pt then reaches forward with R hand to retrieve horse shoes placed at opposite end of platform, then rotates to the L to place shoes on peg, facilitating trunk rotation and WB through L upper extremity. Pt is able to complete with tactile cues for body mechanics and sequencing, but does not require physical assistance. Pt then performs same activity, reaching forward with L hand and then rotating to the R to place horse shoes. PT provides manual facilitation of trunk rotation initially, but pt gradually improves and eventually performs without tactile or physical assistance. Activity progressed by placing shoes further outside  of pt's BOS, forcing him to flex forward further at hips and trunk to retrieve shoes.  Pt performs sit to supine with modA +1 and cues for positioning and sequencing. Pt then practices rolling from L sidelying to supine to R sidelying, and back, with cues for hooklying, engagement of obliques and core  muscles to optimize movement, as well as use of momentum. Pt able to complete rolls with CGA/minA. X10 total. Pt then performs supine to sit with modA/maxA +1. Squat pivot back to WC with maxA +2. Pt left seated in tilt in space WC with alarm intact and all needs within reach.  2nd Session: Pt received supine in bed and agrees to therapy. No complaint of pain. Supine to sit with bed features and maxA, with cues for sequencing and positioning. Pt performs sit to stand from elevated EOB with modA +2 and cues for anterior weight shift and upward propulsion. Following, pt performs sliding board transfer to [facility] with cues on head hips relationship and maxA +2. WC transport to gym for time management. Squat pivot to mat with maxA +2 and cues for initiation. Pt performs multiple reps of sit to stand during session with RW, L hand splint, and mod to maxA +2 Mirror provided for visual feedback. In standing, PT provides blocking of L knee and tactile cueing for increased quad contraction. PT utilizes knee to provide tactile cueing for increased glute engagement and hip extension. After initial stand, pt performs with R foot propped on 3 inch step to promote increased WB through L lower extremity. Demonstration provided to show pt rationale for having foot placed on step, and pt gradually able to decrease pushing through R leg and puts increased weight through L leg.  Pt performs squat pivot back to WC with maxA +2. Left seated with alarm intact and all needs within reach.  Therapy Documentation Precautions:  Precautions Precautions: Fall Precaution Comments: BP goal 130-160 per neuro MD; Dense L side deficits Other Brace: AFO for LLE Restrictions Weight Bearing Restrictions: No   Therapy/Group: Individual Therapy  Breck Coons, PT, DPT 03/13/2021, 4:39 PM

## 2021-03-14 LAB — GLUCOSE, CAPILLARY
Glucose-Capillary: 91 mg/dL (ref 70–99)
Glucose-Capillary: 94 mg/dL (ref 70–99)
Glucose-Capillary: 98 mg/dL (ref 70–99)

## 2021-03-14 MED ORDER — ALPRAZOLAM 0.25 MG PO TABS
0.2500 mg | ORAL_TABLET | Freq: Two times a day (BID) | ORAL | Status: DC | PRN
  Administered 2021-03-16 – 2021-03-17 (×2): 0.25 mg via ORAL
  Filled 2021-03-14 (×2): qty 1

## 2021-03-14 NOTE — Progress Notes (Signed)
Occupational Therapy Weekly Progress Note  Patient Details  Name: Brent Young MRN: [id number] Date of Birth: 01-10-1961  Beginning of progress report period: February 28, 2021 End of progress report period: March 14, 2021  Today's Date: 03/14/2021 OT Individual Time: 1400-1500 OT Individual Time Calculation (min): 60 min    Patient has met 2 of 3 short term goals.  Patient is making slow progress towards OT goals. Pt still needs two helpers for functional transfers and LB ADL tasks. Pt has progressed to being able to stand in [place] more consistently with mod/max A. L UE function is improving, but he still needs cues to integrate it into functional tasks. Continue current plan of care at this time.   Patient continues to demonstrate the following deficits: muscle weakness, decreased cardiorespiratoy endurance and decreased oxygen support, impaired timing and sequencing, abnormal tone, unbalanced muscle activation, ataxia, decreased coordination, and decreased motor planning, decreased midline orientation, decreased attention to left, and decreased motor planning, decreased initiation, decreased attention, decreased awareness, decreased problem solving, decreased safety awareness, decreased memory, and delayed processing, and decreased sitting balance, decreased standing balance, decreased postural control, hemiplegia, and decreased balance strategies and therefore will continue to benefit from skilled OT intervention to enhance overall performance with BADL and Reduce care partner burden.  Patient progressing toward long term goals..  Continue plan of care.  OT Short Term Goals Week 2:  OT Short Term Goal 1 (Week 2): Pt will be able to roll onto his R side with min A to A caregivers. OT Short Term Goal 1 - Progress (Week 2): Progressing toward goal OT Short Term Goal 2 (Week 2): Pt will be able to sit to EOB with mod A of 1. OT Short Term Goal 2 - Progress (Week 2): Met OT Short Term  Goal 3 (Week 2): Pt will be able to rise to stand with max A of 2 to prepare for toileting. OT Short Term Goal 3 - Progress (Week 2): Met Week 3:  OT Short Term Goal 1 (Week 3): Pt will be able to roll onto his R side with min A to A caregivers. OT Short Term Goal 2 (Week 3): Patient will recall hemi dressing techniques with min verbal cues OT Short Term Goal 3 (Week 3): Patient will perform toilet transfer with max A of 1 person  Skilled Therapeutic Interventions/Progress Updates:    Pt greeted seated in recliner and agreeable to OT treatment session, but did not want to go back to the gym. Pt stated his L knee was hurting and he was tired.  L UE NMR with weight bearing through wrist and elbow to help bring pt through full ROM seated in recliner. OT applied Kinesiotape to L hand to help bring awareness to L side. Pt completed hand squeezes using pink foam block. Practice reciprocal scooting in recliner with OT facilitating weight shift and assistance to move L hip. Pt reported that he wanted to return to bed. [place] from wc with 2 trials and max/total A of 1. Pt transitioned back to bed with max A. Pt left semi-reclined in bed with bed alarm on, call bell in reach, and needs met.  Therapy Documentation Precautions:  Precautions Precautions: Fall Precaution Comments: BP goal 130-160 per neuro MD; Dense L side deficits Other Brace: AFO for LLE Restrictions Weight Bearing Restrictions: No  Pain:  Pt reports 6/10 pain in L knee, rest and repositioned for comfort   Therapy/Group: Individual Therapy  Valma Cava 03/14/2021, 2:05  PM

## 2021-03-14 NOTE — Progress Notes (Signed)
Physical Therapy Session Note  Patient Details  Name: Brent Young MRN: [id number] Date of Birth: [birth year]  Today's Date: 03/14/2021 PT Individual Time: 0800-0915 PT Individual Time Calculation (min): 75 min   Short Term Goals: Week 3:  PT Short Term Goal 1 (Week 3): Pt will perform bed mobility with modA +1. PT Short Term Goal 2 (Week 3): Pt will perform sit to stand with modA +1. PT Short Term Goal 3 (Week 3): Pt will perform bed to chair transfer with mod A+1. PT Short Term Goal 4 (Week 3): Pt will initiate gait training.  Skilled Therapeutic Interventions/Progress Updates:     Pt supine in bed to start session. Agreeable to PT tx. Reports 8/10 back pain  but in NAD. Treatment to tolerance with rest breaks and mobility provided for pain management. Pt on 1L O2 va [place] - O2 saturating ~88-93% at rest. Pt reporting he had difficulty eating his breakfast 2/2 difficulty swallowing - pt on regular diet and thin liquids. Messaged primary SLP regarding concerns. Pt noted to be incontinent of bowel and bladder - totalA for pericare at bed level with new brief change. Pt able to roll towards his L side with supervision but requires maxA for rolling R. Donned pants, compression socks, and shoes at totalA level for time.   Supine<>sit completed with maxA, primarily for trunk support due to difficulty achieving sidelying to sitting. Completed SB transfer with modA +2 from EOB to TIS w/c, with bed elevated to assist. Completed in x3 scoots, working on small controlled movements and breaking down SB transfer to increase pt active participation. Connected to 2L portable tank of oxygen for remainder of session.   Transported to main rehab gym for time. Assisted to mat table with modA +2 transfer from TIS w/c to mat table with similar technique as above. At [facility], used large mirror for visual feedback and worked on postural control/awareness, static and dynamic sitting balance, and trunk control. Tasks  included forward, lateral, and midline reaching for cones to challenge righting reactions, balance, and L attention. Overall, required minA for this but he did have x1 LOB posteriorly that required +2 maxA for recovery. We also worked on modified crunches to engage trunk control and assist with forward weight shifting during transfers.   Pt assisted back to his w/c with +2 maxA (towards weaker L side) via SB transfer. Returned to room, remained reclined in TIS w/c for safety, connected o 1L wall O2, call bell in lap, all needs in reach. Informed of upcoming therapy schedule.   Therapy Documentation Precautions:  Precautions Precautions: Fall Precaution Comments: BP goal 130-160 per neuro MD; Dense L side deficits Other Brace: AFO for LLE Restrictions Weight Bearing Restrictions: No General:    Therapy/Group: Individual Therapy  Orrin Brigham 03/14/2021, 7:40 AM

## 2021-03-14 NOTE — Progress Notes (Signed)
PROGRESS NOTE   Subjective/Complaints:  Pt again upset about not being tended to enough last night. Wanted to get up to the chair and says he was told no. Wanted to get up to help his breathing  ROS: Patient denies fever, rash, sore throat, blurred vision, nausea, vomiting, diarrhea, cough, shortness of breath or chest pain, joint or back pain, headache, or mood change.   Objective:   DG Knee Complete 4 Views Left  Result Date: 03/12/2021 CLINICAL DATA:  Patient fell getting into wheelchair today, pain in left knee. Patient had to be held for images due to stroke. EXAM: LEFT KNEE - COMPLETE 4+ VIEW COMPARISON:  X-ray left knee 02/16/2021 FINDINGS: No evidence of fracture, dislocation, or joint effusion. No evidence of severe arthropathy. No aggressive appearing focal bone abnormality. Soft tissues are unremarkable. Vascular calcifications. IMPRESSION: No acute displaced fracture or dislocation. Electronically Signed   By: Tish Frederickson M.D.   On: 03/12/2021 18:56   No results for input(s): WBC, HGB, HCT, PLT in the last 72 hours.  Recent Labs    03/13/21 0515  CREATININE 0.67      Intake/Output Summary (Last 24 hours) at 03/14/2021 1218 Last data filed at 03/14/2021 0100 Gross per 24 hour  Intake 0 ml  Output 500 ml  Net -500 ml        Physical Exam: Vital Signs Blood pressure 134/75, pulse 95, temperature 98.8 F (37.1 C), resp. rate 14, height 6\' 1"  (1.854 m), weight 117.9 kg, SpO2 91 %.   Constitutional: No distress . Vital signs reviewed. HEENT: NCAT, EOMI, oral membranes moist Neck: supple Cardiovascular: RRR without murmur. No JVD    Respiratory/Chest: CTA Bilaterally without wheezes or rales. Normal effort    GI/Abdomen: BS +, non-tender, non-distended Ext: no clubbing, cyanosis, or edema Psych: pleasant and cooperative  Skin: abrasion on left knee, no evidence of rash Neuro:  Pt is alert and oriented  x 3. Mild left central VII, mild left hemi-facial sensory loss. Reasonable insight, awareness and memory. Normal language, mild dysarthria. LUE 2+ deltoid, 3- biceps, tricep,   3 wrist/ HI-motor exam stable. LLE:   1+ to 2 HF, KE- , 0/5 distally-stable, gradually improving.  Left arm and leg 1/2 light touch. Still No resting tone.  Musculoskeletal: left knee without tenderness or effusion. Right shoulder pain   Assessment/Plan: 1. Functional deficits which require 3+ hours per day of interdisciplinary therapy in a comprehensive inpatient rehab setting. Physiatrist is providing close team supervision and 24 hour management of active medical problems listed below. Physiatrist and rehab team continue to assess barriers to discharge/monitor patient progress toward functional and medical goals  Care Tool:  Bathing    Body parts bathed by patient: Chest, Abdomen, Front perineal area, Face, Left arm, Right upper leg, Left upper leg   Body parts bathed by helper: Right lower leg, Left lower leg, Right arm, Front perineal area, Buttocks     Bathing assist Assist Level: Maximal Assistance - Patient 24 - 49%     Upper Body Dressing/Undressing Upper body dressing   What is the patient wearing?: Pull over shirt    Upper body assist Assist Level: Maximal  Assistance - Patient 25 - 49%    Lower Body Dressing/Undressing Lower body dressing      What is the patient wearing?: Pants     Lower body assist Assist for lower body dressing: Maximal Assistance - Patient 25 - 49%     Toileting Toileting    Toileting assist Assist for toileting: 2 Helpers     Transfers Chair/bed transfer  Transfers assist  Chair/bed transfer activity did not occur: Safety/medical concerns  Chair/bed transfer assist level: 2 Helpers     Locomotion Ambulation   Ambulation assist   Ambulation activity did not occur: Safety/medical concerns          Walk 10 feet activity   Assist  Walk 10 feet  activity did not occur: Safety/medical concerns        Walk 50 feet activity   Assist Walk 50 feet with 2 turns activity did not occur: Safety/medical concerns         Walk 150 feet activity   Assist Walk 150 feet activity did not occur: Safety/medical concerns         Walk 10 feet on uneven surface  activity   Assist Walk 10 feet on uneven surfaces activity did not occur: Safety/medical concerns         Wheelchair     Assist Is the patient using a wheelchair?: Yes Type of Wheelchair: Manual Wheelchair activity did not occur: Safety/medical concerns         Wheelchair 50 feet with 2 turns activity    Assist    Wheelchair 50 feet with 2 turns activity did not occur: Safety/medical concerns       Wheelchair 150 feet activity     Assist  Wheelchair 150 feet activity did not occur: Safety/medical concerns       Blood pressure 134/75, pulse 95, temperature 98.8 F (37.1 C), resp. rate 14, height 6\' 1"  (1.854 m), weight 117.9 kg, SpO2 91 %.  Medical Problem List and Plan: 1. 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  Antithrombotics: -DVT/anticoagulation:  Pharmaceutical: Lovenox             -antiplatelet therapy: Aspirin 325 mg daily and Plavix 75 mg daily x3 months then aspirin alone 3. Pain Management: R shoulder pain from fall -Neurontin 300 mg 3 times daily, off oxycodone d/t "loopiness" -ROM, strengthening of R shoulder with therapies  -left knee xray reviewed. No injury. No pain today. Discussed safety with patient today.  4. Mood:   Pt needs regular positive reassurance, anxious at times. Wonder if that is some of the problem at night  -will add low dose xanax  to help with anxiety             -antipsychotic agents: N/A 5. Neuropsych: This patient is capable of making decisions on his own behalf. 6. Skin/Wound Care: local care as needed 7. Fluids/Electrolytes/Nutrition: encourage PO  -check labs again 12/24 8.  Dysphagia.  a advanced to regular thins  -take time, small sips 9.  Hypertension.  Norvasc 10 mg daily Avapro 75 mg daily, Toprol-XL 25 mg daily  12/23- BP controlled continue regimen 10.  Diabetes mellitus.  Hemoglobin A1c 9.9.  SSI, Tradjenta 5 mg daily, Glucophage 1000 mg twice daily.   CBG (last 3)  Recent Labs    03/13/21 1642 03/14/21 0612 03/14/21 1152  GLUCAP 100* 91 94   12/23  controlled  11.  Hyperlipidemia.  Lipitor 12.  Tobacco use.  Counseling 13.  Seizure prophylaxis.  Trileptal 300 mg 3 times daily 14. Constipation-  -moving bowels-continue senna-s at HS as mtc 15. Pulmonary edema/hypoxia- has been using 2L O2 as needed- more often than not--can't seem to wean him thus far. -CXR 12/8 shows mild pulmonary edema. Chest clear on exam 12/22 FV, IS. Is off more than O2 currently          LOS: 15 days A FACE TO FACE EVALUATION WAS PERFORMED  Ranelle Oyster 03/14/2021, 12:18 PM

## 2021-03-14 NOTE — Progress Notes (Signed)
Occupational Therapy Session Note  Patient Details  Name: YOUSUF AGER MRN: [id number] Date of Birth: [birth year]  Today's Date: 03/14/2021 OT Individual Time: 1100-1155 OT Individual Time Calculation (min): 55 min    Short Term Goals: Week 2:  OT Short Term Goal 1 (Week 2): Pt will be able to roll onto his R side with min A to A caregivers. OT Short Term Goal 2 (Week 2): Pt will be able to sit to EOB with mod A of 1. OT Short Term Goal 3 (Week 2): Pt will be able to rise to stand with max A of 2 to prepare for toileting.  Skilled Therapeutic Interventions/Progress Updates:    Pt sleeping in TIS w/c upon arrival but easily aroused although pt initially disoriented. O2 sats 99% on 2L O2. Pt requested to used [facility] for bowel movement. Pt transferred to [facility] over toilet with [place]edy and +2 initially for standing in [place]. Pt progressed to standing in [place] with min A. Pt dependent for all toileting tasks. Pt transerred to recliner with Stedy. Sit<>stand in Stedy x 6. Standing balance in [place] with min A and max verbal cues for safety and positioning when standing. Pt with Lt lean but able to self correct with verbal cues. Pt remained in recliner with belt alarm activated. All needs within reach.   Therapy Documentation Precautions:  Precautions Precautions: Fall Precaution Comments: BP goal 130-160 per neuro MD; Dense L side deficits Other Brace: AFO for LLE Restrictions Weight Bearing Restrictions: No   Pain:  Pt denies pain this morning   Therapy/Group: Individual Therapy  Rich Brave 03/14/2021, 12:04 PM

## 2021-03-15 LAB — BASIC METABOLIC PANEL
Anion gap: 11 (ref 5–15)
BUN: 13 mg/dL (ref 8–23)
CO2: 31 mmol/L (ref 22–32)
Calcium: 9.1 mg/dL (ref 8.9–10.3)
Chloride: 102 mmol/L (ref 98–111)
Creatinine, Ser: 0.67 mg/dL (ref 0.61–1.24)
GFR, Estimated: >60 mL/min (ref >60)
Glucose, Bld: 111 mg/dL — ABNORMAL HIGH (ref 70–99)
Potassium: 3.8 mmol/L (ref 3.5–5.1)
Sodium: 144 mmol/L (ref 135–145)

## 2021-03-15 LAB — CBC
HCT: 32.8 % — ABNORMAL LOW (ref 39.0–52.0)
Hemoglobin: 10.7 g/dL — ABNORMAL LOW (ref 13.0–17.0)
MCH: 30.9 pg (ref 26.0–34.0)
MCHC: 32.6 g/dL (ref 30.0–36.0)
MCV: 94.8 fL (ref 80.0–100.0)
Platelets: 267 10*3/uL (ref 150–400)
RBC: 3.46 MIL/uL — ABNORMAL LOW (ref 4.22–5.81)
RDW: 15.6 % — ABNORMAL HIGH (ref 11.5–15.5)
WBC: 8.3 10*3/uL (ref 4.0–10.5)
nRBC: 0 % (ref 0.0–0.2)

## 2021-03-15 LAB — GLUCOSE, CAPILLARY
Glucose-Capillary: 122 mg/dL — ABNORMAL HIGH (ref 70–99)
Glucose-Capillary: 80 mg/dL (ref 70–99)
Glucose-Capillary: 121 mg/dL — ABNORMAL HIGH (ref 70–99)
Glucose-Capillary: 109 mg/dL — ABNORMAL HIGH (ref 70–99)

## 2021-03-15 NOTE — Progress Notes (Signed)
PROGRESS NOTE   Subjective/Complaints:  Pt still fixated on problems with night staff not tending to him promptly. Continues to ask about transfer to [facility]. Otherwise is making gains  ROS: Patient denies fever, rash, sore throat, blurred vision, nausea, vomiting, diarrhea, cough, shortness of breath or chest pain, joint or back pain, headache, or mood change.   Objective:   No results found. Recent Labs    03/15/21 0755  WBC 8.3  HGB 10.7*  HCT 32.8*  PLT 267    Recent Labs    03/13/21 0515 03/15/21 0755  NA  --  144  K  --  3.8  CL  --  102  CO2  --  31  GLUCOSE  --  111*  BUN  --  13  CREATININE 0.67 0.67  CALCIUM  --  9.1      Intake/Output Summary (Last 24 hours) at 03/15/2021 1018 Last data filed at 03/15/2021 0700 Gross per 24 hour  Intake 340 ml  Output 325 ml  Net 15 ml        Physical Exam: Vital Signs Blood pressure (!) 141/95, pulse 94, temperature (!) 96.4 F (35.8 C), resp. rate 14, height 6\' 1"  (1.854 m), weight 117.9 kg, SpO2 92 %.   Constitutional: No distress . Vital signs reviewed. HEENT: NCAT, EOMI, oral membranes moist Neck: supple Cardiovascular: RRR without murmur. No JVD    Respiratory/Chest: CTA Bilaterally without wheezes or rales. Normal effort    GI/Abdomen: BS +, non-tender, non-distended Ext: no clubbing, cyanosis, or edema Psych: pleasant, sl anxious Skin: abrasion on left knee resolving, no evidence of rash Neuro:  alert, oriented. Mild left central VII, mild left hemi-facial sensory loss. Reasonable insight, awareness and memory. Normal language, mild dysarthria. LUE 2+ deltoid, 3- biceps, tricep,   3 wrist/ HI-motor exam without substantial changes. LLE:   1+ to 2 HF, KE- , 0/5 distally-stable, gradually improving.  Left arm and leg 1/2 light touch. Still No resting tone.  Musculoskeletal: left knee without tenderness or effusion. Right shoulder  pain   Assessment/Plan: 1. Functional deficits which require 3+ hours per day of interdisciplinary therapy in a comprehensive inpatient rehab setting. Physiatrist is providing close team supervision and 24 hour management of active medical problems listed below. Physiatrist and rehab team continue to assess barriers to discharge/monitor patient progress toward functional and medical goals  Care Tool:  Bathing    Body parts bathed by patient: Chest, Abdomen, Front perineal area, Face, Left arm, Right upper leg, Left upper leg   Body parts bathed by helper: Right lower leg, Left lower leg, Right arm, Front perineal area, Buttocks     Bathing assist Assist Level: Maximal Assistance - Patient 24 - 49%     Upper Body Dressing/Undressing Upper body dressing   What is the patient wearing?: Pull over shirt    Upper body assist Assist Level: Maximal Assistance - Patient 25 - 49%    Lower Body Dressing/Undressing Lower body dressing      What is the patient wearing?: Pants     Lower body assist Assist for lower body dressing: Maximal Assistance - Patient 25 - 49%  Toileting Toileting    Toileting assist Assist for toileting: 2 Helpers     Transfers Chair/bed transfer  Transfers assist  Chair/bed transfer activity did not occur: Safety/medical concerns  Chair/bed transfer assist level: 2 Helpers     Locomotion Ambulation   Ambulation assist   Ambulation activity did not occur: Safety/medical concerns          Walk 10 feet activity   Assist  Walk 10 feet activity did not occur: Safety/medical concerns        Walk 50 feet activity   Assist Walk 50 feet with 2 turns activity did not occur: Safety/medical concerns         Walk 150 feet activity   Assist Walk 150 feet activity did not occur: Safety/medical concerns         Walk 10 feet on uneven surface  activity   Assist Walk 10 feet on uneven surfaces activity did not occur:  Safety/medical concerns         Wheelchair     Assist Is the patient using a wheelchair?: Yes Type of Wheelchair: Manual Wheelchair activity did not occur: Safety/medical concerns         Wheelchair 50 feet with 2 turns activity    Assist    Wheelchair 50 feet with 2 turns activity did not occur: Safety/medical concerns       Wheelchair 150 feet activity     Assist  Wheelchair 150 feet activity did not occur: Safety/medical concerns       Blood pressure (!) 141/95, pulse 94, temperature (!) 96.4 F (35.8 C), resp. rate 14, height 6\' 1"  (1.854 m), weight 117.9 kg, SpO2 92 %.  Medical Problem List and Plan: 1. 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 Asked him if he's having realistic expectations in regard to staff response time. Also discussed with charge RN      2.  Antithrombotics: -DVT/anticoagulation:  Pharmaceutical: Lovenox             -antiplatelet therapy: Aspirin 325 mg daily and Plavix 75 mg daily x3 months then aspirin alone 3. Pain Management: R shoulder pain from fall -Neurontin 300 mg 3 times daily, off oxycodone d/t "loopiness" -ROM, strengthening of R shoulder with therapies  -left knee xray reviewed. No injury. No pain today. Discussed safety with patient today.  4. Mood:   Pt needs regular positive reassurance, anxious at times. Wonder if that is some of the problem at night  -added low dose xanax to help with anxiety             -antipsychotic agents: N/A 5. Neuropsych: This patient is capable of making decisions on his own behalf. 6. Skin/Wound Care: local care as needed 7. Fluids/Electrolytes/Nutrition: encourage  PO  -I personally reviewed all of the patient's labs today, and lab work is within normal limits.  8.  Dysphagia.  a advanced to regular thins  -take time, small sips 9.  Hypertension.  Norvasc 10 mg daily Avapro 75 mg daily, Toprol-XL 25 mg daily  12/24- BP within reasonable control 10.  Diabetes  mellitus.  Hemoglobin A1c 9.9.  SSI, Tradjenta 5 mg daily, Glucophage 1000 mg twice daily.   CBG (last 3)  Recent Labs    03/14/21 1152 03/14/21 1643 03/15/21 0623  GLUCAP 94 98 122*   12/24  controlled  11.  Hyperlipidemia.  Lipitor 12.  Tobacco use.  Counseling 13.  Seizure prophylaxis.  Trileptal 300 mg 3 times daily 14. Constipation-  -moving bowels-continue senna-s at HS as mtc 15. Pulmonary edema/hypoxia- has been using 2L O2 as needed- more often than not--can't seem to wean him thus far. -CXR 12/8 shows mild pulmonary edema. Chest clear on exam 12/24 continue FV, IS. SLOWLY weaning off O2          LOS: 16 days A FACE TO FACE EVALUATION WAS PERFORMED  Ranelle Oyster 03/15/2021, 10:18 AM

## 2021-03-15 NOTE — Progress Notes (Signed)
Occupational Therapy Session Note  Patient Details  Name: Brent Young MRN: [id number] Date of Birth: [birth year]  Today's Date: 03/15/2021 OT Individual Time: 2694-8546 OT Individual Time Calculation (min): 58 min    Short Term Goals: Week 3:  OT Short Term Goal 1 (Week 3): Pt will be able to roll onto his R side with min A to A caregivers. OT Short Term Goal 2 (Week 3): Patient will recall hemi dressing techniques with min verbal cues OT Short Term Goal 3 (Week 3): Patient will perform toilet transfer with max A of 1 person  Skilled Therapeutic Interventions/Progress Updates:    Pt greeted seated in recliner with O2 on 2L. SpO2 at 99%. Pt needed two trials, but able to get into standing with max A of 1 into Stedy. Stdey transfer over to the sink. Worked on sit><stand from perched stedy seat with mod A. Pt with improved trunk control and midline orientation in sitting and standing today. Pt needed OT assist to wash buttocks, but was able to mainatin standing in [place] with 1 person assist. OT donned clean brief as well. Worked on LB dressing with focus on anterior weight shift and min cues to recall hemi dressing strategies. Overall Total A still to thread pants.Pt needed cues to integrate L UE into bathing tasks but was able to grasp wash cloth today and bring across body. Cues again to recall hemi UB dressing strategy. OT educated on use of L hand to stabilize with grasp for BADL tasks. See below for further details on O2. Pt left seated in wc tilted back past 45 degeress with alarm belt on, call bell in reach, and needs met.  SpO2 99% on 2L at rest SpO2 92% on RA at rest SpO2 80% on RA during activity Difficulty recovering with SpO2 at 83 % after rest break from standing.  Pt placed back on 2L.   Therapy Documentation Precautions:  Precautions Precautions: Fall Precaution Comments: BP goal 130-160 per neuro MD; Dense L side deficits Other Brace: AFO for LLE Restrictions Weight  Bearing Restrictions: No General:   Vital Signs: Oxygen Therapy SpO2: 92 % O2 Device: Nasal Cannula Pain: Pain Assessment Pain Scale: 0-10 Pain Score: 0-No pain   Therapy/Group: Individual Therapy  Valma Cava 03/15/2021, 10:18 AM

## 2021-03-16 LAB — GLUCOSE, CAPILLARY
Glucose-Capillary: 93 mg/dL (ref 70–99)
Glucose-Capillary: 119 mg/dL — ABNORMAL HIGH (ref 70–99)
Glucose-Capillary: 87 mg/dL (ref 70–99)

## 2021-03-17 LAB — GLUCOSE, CAPILLARY
Glucose-Capillary: 105 mg/dL — ABNORMAL HIGH (ref 70–99)
Glucose-Capillary: 98 mg/dL (ref 70–99)
Glucose-Capillary: 83 mg/dL (ref 70–99)
Glucose-Capillary: 97 mg/dL (ref 70–99)
Glucose-Capillary: 158 mg/dL — ABNORMAL HIGH (ref 70–99)

## 2021-03-17 NOTE — Progress Notes (Signed)
Occupational Therapy Session Note  Patient Details  Name: Brent Young MRN: [id number] Date of Birth: [birth year]  Today's Date: 03/17/2021 OT Individual Time: 1102-1200 OT Individual Time Calculation (min): 58 min    Short Term Goals: Week 3:  OT Short Term Goal 1 (Week 3): Pt will be able to roll onto his R side with min A to A caregivers. OT Short Term Goal 2 (Week 3): Patient will recall hemi dressing techniques with min verbal cues OT Short Term Goal 3 (Week 3): Patient will perform toilet transfer with max A of 1 person  Skilled Therapeutic Interventions/Progress Updates:  Patient met seated in TIS wc in agreement with OT treatment session. 0/10 pain reported at rest and with activity. Patient with noticeably soiled shirt. Patient denied need for bathing but reported desire to change shirt. Patient then able to doff shirt seated at sink and don new shirt with fair carryover of hemi technique and Mod A. Patient then completed 2/3 grooming tasks seated at sink level with hand over hand assist and Max cues for incorporation of LUE. Focus then shifted to functional transfers with patient able to stand in stedy with Max A and cues for orientation to midline. Mirror utilized for Warden/ranger. Patient then competed an additional 3 stands from perched position in stedy with Mod-Max A. Able to for up to 45 seconds with palpable activation of L triceps. Total A for wc transport to dayroom in prep for therapeutic activity. LUE NMR with grasp/release activity and Min-Mod A for controlled movement 2/2 ataxia. Patient also made a tree ornament with focus on reaching outside of BOS and dynamic sitting balance while reaching for items on table. Session concluded with patient seated in TIS wc with wc tilted back, call bell within reach, belt alarm activated and all needs met.   Therapy Documentation Precautions:  Precautions Precautions: Fall Precaution Comments: BP goal 130-160 per neuro MD; Dense  L side deficits Other Brace: AFO for LLE Restrictions Weight Bearing Restrictions: No General:   Therapy/Group: Individual Therapy  [name] 03/17/2021, 6:53 AM

## 2021-03-17 NOTE — Progress Notes (Signed)
PROGRESS NOTE   Subjective/Complaints: No new complaints this morning Feels staff has been more attentive and he no longer wants to leave Friend visiting today  ROS: Patient denies fever, rash, sore throat, blurred vision, nausea, vomiting, diarrhea, cough, shortness of breath or chest pain, joint or back pain, headache, or mood change.   Objective:   No results found. Recent Labs    03/15/21 0755  WBC 8.3  HGB 10.7*  HCT 32.8*  PLT 267    Recent Labs    03/15/21 0755  NA 144  K 3.8  CL 102  CO2 31  GLUCOSE 111*  BUN 13  CREATININE 0.67  CALCIUM 9.1      Intake/Output Summary (Last 24 hours) at 03/17/2021 1422 Last data filed at 03/17/2021 1328 Gross per 24 hour  Intake 712 ml  Output 700 ml  Net 12 ml        Physical Exam: Vital Signs Blood pressure 131/79, pulse 100, temperature 98 F (36.7 C), resp. rate 14, height 6\' 1"  (1.854 m), weight 117.9 kg, SpO2 93 %. Gen: no distress, normal appearing HEENT: oral mucosa pink and moist, NCAT Cardio: Reg rate Chest: normal effort, normal rate of breathing Abd: soft, non-distended Ext: no edema Psych: pleasant, sl anxious Skin: abrasion on left knee resolving, no evidence of rash Neuro:  alert, oriented. Mild left central VII, mild left hemi-facial sensory loss. Reasonable insight, awareness and memory. Normal language, mild dysarthria. LUE 2+ deltoid, 3- biceps, tricep,   3 wrist/ HI-motor exam without substantial changes. LLE:   1+ to 2 HF, KE- , 0/5 distally-stable, gradually improving.  Left arm and leg 1/2 light touch. Still No resting tone.  Musculoskeletal: left knee without tenderness or effusion. Right shoulder pain   Assessment/Plan: 1. Functional deficits which require 3+ hours per day of interdisciplinary therapy in a comprehensive inpatient rehab setting. Physiatrist is providing close team supervision and 24 hour management of active  medical problems listed below. Physiatrist and rehab team continue to assess barriers to discharge/monitor patient progress toward functional and medical goals  Care Tool:  Bathing    Body parts bathed by patient: Chest, Abdomen, Front perineal area, Face, Left arm, Right upper leg, Left upper leg   Body parts bathed by helper: Right lower leg, Left lower leg, Right arm, Front perineal area, Buttocks     Bathing assist Assist Level: Maximal Assistance - Patient 24 - 49%     Upper Body Dressing/Undressing Upper body dressing   What is the patient wearing?: Pull over shirt    Upper body assist Assist Level: Maximal Assistance - Patient 25 - 49%    Lower Body Dressing/Undressing Lower body dressing      What is the patient wearing?: Pants     Lower body assist Assist for lower body dressing: Maximal Assistance - Patient 25 - 49%     Toileting Toileting    Toileting assist Assist for toileting: 2 Helpers     Transfers Chair/bed transfer  Transfers assist  Chair/bed transfer activity did not occur: Safety/medical concerns  Chair/bed transfer assist level: 2 Helpers     Locomotion Ambulation   Ambulation assist  Ambulation activity did not occur: Safety/medical concerns          Walk 10 feet activity   Assist  Walk 10 feet activity did not occur: Safety/medical concerns        Walk 50 feet activity   Assist Walk 50 feet with 2 turns activity did not occur: Safety/medical concerns         Walk 150 feet activity   Assist Walk 150 feet activity did not occur: Safety/medical concerns         Walk 10 feet on uneven surface  activity   Assist Walk 10 feet on uneven surfaces activity did not occur: Safety/medical concerns         Wheelchair     Assist Is the patient using a wheelchair?: Yes Type of Wheelchair: Manual Wheelchair activity did not occur: Safety/medical concerns         Wheelchair 50 feet with 2 turns  activity    Assist    Wheelchair 50 feet with 2 turns activity did not occur: Safety/medical concerns       Wheelchair 150 feet activity     Assist  Wheelchair 150 feet activity did not occur: Safety/medical concerns       Blood pressure 131/79, pulse 100, temperature 98 F (36.7 C), resp. rate 14, height 6\' 1"  (1.854 m), weight 117.9 kg, SpO2 93 %.  Medical Problem List and Plan: 1. 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 Impaired mobility: continue Lovenox             -antiplatelet therapy: Aspirin 325 mg daily and Plavix 75 mg daily x3 months then aspirin alone 3. R shoulder pain from fall -continue Neurontin 300 mg 3 times daily, off oxycodone d/t "loopiness" -ROM, strengthening of R shoulder with therapies  -left knee xray reviewed. No injury. No pain today. Discussed safety with patient today.  4. Anxiety  Pt needs regular positive reassurance, anxious at times. Wonder if that is some of the problem at night  -continue low dose xanax to help with anxiety             -antipsychotic agents: N/A 5. Neuropsych: This patient is capable of making decisions on his own behalf. 6. Skin/Wound Care: local care as needed 7. Fluids/Electrolytes/Nutrition: encourage PO  -I personally reviewed all of the patient's labs today, and lab work is within normal limits.  8.  Dysphagia.  a advanced to regular thins  -take time, small sips 9.  Hypertension.  Norvasc 10 mg daily Avapro 75 mg daily, Toprol-XL 25 mg daily  12/24- BP within reasonable control 10.  Diabetes mellitus.  Hemoglobin A1c 9.9.  SSI, Tradjenta 5 mg daily, Glucophage 1000 mg twice daily.   CBG (last 3)  Recent Labs    03/17/21 0124 03/17/21 0634  03/17/21 1231  GLUCAP 105* 98 83   12/26  controlled  11.  Hyperlipidemia.  Lipitor 12.  Tobacco use.  Counseling 13.  Seizure prophylaxis.  Trileptal 300 mg 3 times daily 14. Constipation-  -moving bowels-continue senna-s at HS as mtc 15. Pulmonary edema/hypoxia- has been using 2L O2 as needed- more  often than not--can't seem to wean him thus far. -CXR 12/8 shows mild pulmonary edema. Chest clear on exam 12/24 continue FV, IS. SLOWLY weaning off O2          LOS: 18 days A FACE TO FACE EVALUATION WAS PERFORMED  Darion Milewski P Geneen Dieter 03/17/2021, 2:22 PM

## 2021-03-17 NOTE — Progress Notes (Signed)
Physical Therapy Session Note  Patient Details  Name: Brent Young MRN: [id number] Date of Birth: June 19, 1960  Today's Date: 03/17/2021 PT Individual Time: 9476-5465 and 1400-1445 PT Individual Time Calculation (min): 72 min and 45 min  Short Term Goals: Week 3:  PT Short Term Goal 1 (Week 3): Pt will perform bed mobility with modA +1. PT Short Term Goal 2 (Week 3): Pt will perform sit to stand with modA +1. PT Short Term Goal 3 (Week 3): Pt will perform bed to chair transfer with mod A+1. PT Short Term Goal 4 (Week 3): Pt will initiate gait training.  Skilled Therapeutic Interventions/Progress Updates:     Pt received supine in bed and agrees to therapy. No complaint of pain. Supine to sit with multimodal cues for sequencing and body mechanics, requiring modA/maxA. Pt performs sit to stand from EOB with modA +2 and Stedy, with cues for trunk position and hand placement. TotalA stedy transfer to [facility] for attempted bowel movement. Sit to stand with Stedy from toilet with modA +2 and PT and tech provide assistance to pull up brief and pants. Stedy transfer to [facility] with totalA. WC transport to gym for time management. Pt performs squat pivot transfer from WC to mat with maxA +2 and multimodal cues for body mechanics and sequencing.   Mirror provided for visual feedback. Pt performs multiple reps of sit to stand during session, typically requiring modA +2 to complete, with cues for initiation, anterior weight transition, hips extension and blocking of L knee to facilitate extension. Pt works on achieving neutral posture with multimodal cueing to correct and facilitate contraction of L lower extremity extensor muscle groups. Pt performs several reps of sit to stand with EVA walker to allow for improved upright posture with WB through bilateral elbows for NM feedback. Pt tends to push with R leg and is able to slight decrease pushing with verbal and tactile cueing.   Pt performs stand step transfer  from mat to [facility] with maxA +2, with facilitation of lateral weight shifting, upright trunk posture, sequencing, and positioning. Pt left seated in tilt in space WC with alarm intact and all needs within reach.  2nd Session: Pt received seated in tilt in space WC and agrees to therapy. No complaint of pain. WC transport to gym for time management. Pt getting ready to attempt gait training using hall hand rail but mentions that he needs to have a bowel movement. WC transport back to room. ModA for sit to stand with Stedy. TotalA for transfer to [facility] with cues for hipe extension, glute engagement and increased eccentric control when going from stand to sitting on BSC. Following toileting, pt performs multiple sit to stand transfers with modA and remains in standing to assist with pericare, which requires totalA. Pt also stands multiple times to don clean brief. Multimodal cues provided for initiation, weight shifting, and posture in standing. Pt visibly fatigued following toileting and brief change, and requests to return to bed. Stedy transfer back to bed. ModA for sit to supine with cues for sequencing. Pt left supine with alarm intact and all needs within reach. Pt misses 15 minutes of skilled PT due to fatigue.  Therapy Documentation Precautions:  Precautions Precautions: Fall Precaution Comments: BP goal 130-160 per neuro MD; Dense L side deficits Other Brace: AFO for LLE Restrictions Weight Bearing Restrictions: No    Therapy/Group: Individual Therapy  Beau Fanny, PT, DPT 03/17/2021, 3:52 PM

## 2021-03-18 ENCOUNTER — Inpatient Hospital Stay (HOSPITAL_COMMUNITY): Payer: Medicaid Other

## 2021-03-18 LAB — URINALYSIS, ROUTINE W REFLEX MICROSCOPIC
Bilirubin Urine: NEGATIVE
Glucose, UA: NEGATIVE mg/dL
Hgb urine dipstick: NEGATIVE
Ketones, ur: NEGATIVE mg/dL
Leukocytes,Ua: NEGATIVE
Nitrite: NEGATIVE
Protein, ur: NEGATIVE mg/dL
Specific Gravity, Urine: >1.03 — ABNORMAL HIGH (ref 1.005–1.030)
pH: 6 (ref 5.0–8.0)

## 2021-03-18 LAB — CBC
HCT: 30.6 % — ABNORMAL LOW (ref 39.0–52.0)
Hemoglobin: 9.8 g/dL — ABNORMAL LOW (ref 13.0–17.0)
MCH: 30.5 pg (ref 26.0–34.0)
MCHC: 32 g/dL (ref 30.0–36.0)
MCV: 95.3 fL (ref 80.0–100.0)
Platelets: 253 10*3/uL (ref 150–400)
RBC: 3.21 MIL/uL — ABNORMAL LOW (ref 4.22–5.81)
RDW: 15.9 % — ABNORMAL HIGH (ref 11.5–15.5)
WBC: 8.7 10*3/uL (ref 4.0–10.5)
nRBC: 0 % (ref 0.0–0.2)

## 2021-03-18 LAB — BLOOD GAS, ARTERIAL
Acid-Base Excess: 8.1 mmol/L — ABNORMAL HIGH (ref 0.0–2.0)
Bicarbonate: 34.4 mmol/L — ABNORMAL HIGH (ref 20.0–28.0)
Drawn by: 42783
FIO2: 24
O2 Saturation: 84.6 %
Patient temperature: 37
pCO2 arterial: 72.2 mmHg (ref 32.0–48.0)
pH, Arterial: 7.299 — ABNORMAL LOW (ref 7.350–7.450)
pO2, Arterial: 58.4 mmHg — ABNORMAL LOW (ref 83.0–108.0)

## 2021-03-18 LAB — BASIC METABOLIC PANEL
Anion gap: 8 (ref 5–15)
BUN: 12 mg/dL (ref 8–23)
CO2: 32 mmol/L (ref 22–32)
Calcium: 9 mg/dL (ref 8.9–10.3)
Chloride: 100 mmol/L (ref 98–111)
Creatinine, Ser: 0.69 mg/dL (ref 0.61–1.24)
GFR, Estimated: >60 mL/min (ref >60)
Glucose, Bld: 145 mg/dL — ABNORMAL HIGH (ref 70–99)
Potassium: 3.7 mmol/L (ref 3.5–5.1)
Sodium: 140 mmol/L (ref 135–145)

## 2021-03-18 LAB — GLUCOSE, CAPILLARY
Glucose-Capillary: 123 mg/dL — ABNORMAL HIGH (ref 70–99)
Glucose-Capillary: 121 mg/dL — ABNORMAL HIGH (ref 70–99)
Glucose-Capillary: 104 mg/dL — ABNORMAL HIGH (ref 70–99)
Glucose-Capillary: 102 mg/dL — ABNORMAL HIGH (ref 70–99)

## 2021-03-18 NOTE — Progress Notes (Signed)
Occupational Therapy Session Note  Patient Details  Name: Brent Young MRN: [id number] Date of Birth: [birth year]  Today's Date: 03/18/2021 OT Individual Time: 1834-3735 OT Individual Time Calculation (min): 54 min   Short Term Goals: Week 3:  OT Short Term Goal 1 (Week 3): Pt will be able to roll onto his R side with min A to A caregivers. OT Short Term Goal 2 (Week 3): Patient will recall hemi dressing techniques with min verbal cues OT Short Term Goal 3 (Week 3): Patient will perform toilet transfer with max A of 1 person  Skilled Therapeutic Interventions/Progress Updates:    Pt greeted semi-reclined in bed with nursing assisting pt with urinal for urine sample. Pt needed assistance to place urinal. Pt on 2L of O2 throughout session. Pt reported still being very fatigued, but agreeable to try to participate in therapy. Pt needed max A to get to sitting EOB. Worked on sitting balance and trunk control at EOB. Pt needed CGA overall for static sitting with decreased pushing noted. L UE NMR seated EOB with weight bearing through L UE to support sitting. Then had pt push and pull OT on L side with resistance. L hand grip strength with foam squeezes with pink foam. Pt completed 4 sit<>stands with max A +2 and facilitation for hip and trunk extension into standing. Pt needed extended rest breaks in between stands and tolerated standing for 30 second bouts. Pt also needed L knee block in standing. Pulmonary entered room and started assessment seated EOB, then pt returned to supine with max A +2. Pt left semi-reclined in bed with bed alarm on, call bell in reach, and needs met.   Therapy Documentation Precautions:  Precautions Precautions: Fall Precaution Comments: BP goal 130-160 per neuro MD; Dense L side deficits Other Brace: AFO for LLE Restrictions Weight Bearing Restrictions: No Pain: Pain Assessment Pain Scale: 0-10 Pain Score: 0-No pain   Therapy/Group: Individual  Therapy  Valma Cava 03/18/2021, 11:49 AM

## 2021-03-18 NOTE — Progress Notes (Signed)
RT setup BiPAP (dream station) per order at patients bedside.  RT placed patient on BiPAP however within a few minutes patient requesting to be taken off.  RT stressed how important it was/why patient needed to wear BiPAP while sleeping.  After highly encouraging patient to at least try it for a few hours patient still declined.  RT will continue to monitor.

## 2021-03-18 NOTE — Patient Care Conference (Signed)
Inpatient RehabilitationTeam Conference and Plan of Care Update Date: 03/18/2021   Time: 10:37 AM    Patient Name: Brent Young      Medical Record Number: [id number]  Date of Birth: 1960-06-02 Sex: Male         Room/Bed: 4W11C/4W11C-01 Payor Info: Payor: MEDICAID [place] / Plan: MEDICAID [place]-FAMILY PLANNING / Product Type: *No Product type* /    Admit Date/Time:  02/27/2021  3:04 PM  Primary Diagnosis:  Infarction of right basal ganglia [facility])  Hospital Problems: Principal Problem:   Infarction of right basal ganglia [facility])    Expected Discharge Date: Expected Discharge Date: 03/28/21  Team Members Present: Physician leading conference: Dr. 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  Minimal assistance overall, moderate assistance for tub shower transfer, may need to downgrade  L UE NMR, self-care retraining, balance training, activity tolerance   Mobility   modA to maxA for bed mobilty and tranfers, modA to modA +2 for sit to stand, gait initiated  minA, probably WC level  trunk control, transfers, NMR for L hemibody, motor planning, balance   Communication             Safety/Cognition/ Behavioral Observations            Pain   c/o right shoulder pain  pain level <3/10  assess pain q 4hr and prn   Skin   CDI  remain free of new skin breakdown/infection  assess skin q shift and prn     Discharge Planning:  Pt is uninsured. Pt screened by financial counselors. MCD pending DDS eligibility. D/c to home with his son Brent Young being primary  caregiver.   Team Discussion: Has needed O2 past couple of days. Attempting to wean oxygen. Possible hypoxic at HS. Discontinued Xanax. Incontinent B/B, implement 2 hr time toileting schedule. Has right shoulder pain. Making slow progress with OT. De'sats to the low 80's, +2 transfers and lower body ADL's. Pushing is improving. Getting some return in LLE. Took steps for the first time. Will be a lot of work for family. Son is here often but doesn't participate. Will discharge at [facility] level. SLP discharged patient.  Patient on target to meet rehab goals: Min assist goals with some mod assist.  *See Care Plan and progress notes for long and short-term goals.   Revisions to Treatment Plan:  Adjusting medications  Teaching Needs: Family education, medication/pain management, skin/wound care, safety awareness, transfer training, etc.  Current Barriers to Discharge: Decreased caregiver support, Home enviroment access/layout, Incontinence, Lack of/limited family support, Weight, Medication compliance, and New oxygen  Possible Resolutions to Barriers: Family education Follow-up PT/OT or HEP Follow-up on oxygen need     Medical Summary Current Status: stable to improved left sided motor. had been weaning off oxygen. now on it again. more lethargic today, ?sleep vs apnea related  Barriers to Discharge: Medical stability   Possible Resolutions to [employer] Focus: daily assessment of labs, med adjustments, patient education   Continued Need for Acute Rehabilitation  Level of Care: The patient requires daily medical management by a physician with specialized training in physical medicine and rehabilitation for the following reasons: Direction of a multidisciplinary physical rehabilitation program to maximize functional independence : Yes Medical management of patient stability for increased activity during participation in an intensive rehabilitation regime.: Yes Analysis of laboratory values  and/or radiology reports with any subsequent need for medication adjustment and/or medical intervention. : Yes   I attest that I was present, lead the team conference, and concur with the assessment and plan of the team.   Brent Young 03/18/2021, 2:12 PM

## 2021-03-18 NOTE — Progress Notes (Signed)
Physical Therapy Session Note  Patient Details  Name: Brent Young MRN: [id number] Date of Birth: 12-11-1960  Today's Date: 03/18/2021 PT Individual Time: 0802-0900 PT Individual Time Calculation (min): 58 min   Short Term Goals: Week 3:  PT Short Term Goal 1 (Week 3): Pt will perform bed mobility with modA +1. PT Short Term Goal 2 (Week 3): Pt will perform sit to stand with modA +1. PT Short Term Goal 3 (Week 3): Pt will perform bed to chair transfer with mod A+1. PT Short Term Goal 4 (Week 3): Pt will initiate gait training.  Skilled Therapeutic Interventions/Progress Updates:     Pt received supine in bed asleep, with MD in room attempting to rouse pt. Pt eventually awakens and is agreeable to therapy, but appears very lethargic. Mentions some pain in R shoulder, but no number provides rest breaks to manage pain. Supine to sit with cue son sequencing and maxA +1 required. Pt performs sit to stand from elevated bed to [facility] with maxA +2 and cues for body mechanics, hip extension and sequencing. Stedy transfer to tilt in space WC with total A. WC transport to gym for time management. Bilateral ted hose donned in gym, and prior to mobility, pt verbalizes need to have bowel movement. Sit to stand to stedy with maxA +2 and totalA for transfer to [facility]. Pt requires TotalA +1 to stand from [facility] for repositioning. MaxA +2 to stand following toileting (pt continent of urine but no bowel movement). Pt requires repeated multimodal cueing to extend hips and trunk for safe standing balance as PT assists with pericare. Pt requires modA +1 to maintain sitting balance in Stedy due to lack of trunk control and appearing to fall asleep while in [place]. TotalA for transfer back to bed. Left supien with alarm intact and all needs within reach.  2nd Session: Pt in bed upon PT arrival. Reports feeling very fatigued and requesting a break from therapy this afternoon. PT will follow up as appropriate. Pt misses 60  minutes of skilled PT.  Therapy Documentation Precautions:  Precautions Precautions: Fall Precaution Comments: BP goal 130-160 per neuro MD; Dense L side deficits Other Brace: AFO for LLE Restrictions Weight Bearing Restrictions: No    Therapy/Group: Individual Therapy  Beau Fanny, PT, DPT 03/18/2021, 2:43 PM

## 2021-03-18 NOTE — Progress Notes (Signed)
RT seen ABG results. Was not notified of critical values by lab or RN of pt. RT gave critical results to Dr. Riley Kill. No new orders received at this time. RT will continue to monitor.

## 2021-03-18 NOTE — Progress Notes (Addendum)
PROGRESS NOTE   Subjective/Complaints: Pt very groggy this morning. Says he didn't sleep well. PT noted that he was slower yesterday as well. Pt received trazodone and xanax last night. Sats have been low at times in ?80's. Intermittent cough  ROS: Limited due to cognitive/behavioral   Objective:   No results found. Recent Labs    03/18/21 0927  WBC 8.7  HGB 9.8*  HCT 30.6*  PLT 253    No results for input(s): NA, K, CL, CO2, GLUCOSE, BUN, CREATININE, CALCIUM in the last 72 hours.     Intake/Output Summary (Last 24 hours) at 03/18/2021 0955 Last data filed at 03/18/2021 0200 Gross per 24 hour  Intake 472 ml  Output 325 ml  Net 147 ml        Physical Exam: Vital Signs Blood pressure 110/61, pulse 97, temperature (!) 97 F (36.1 C), resp. rate 16, height 6\' 1"  (1.854 m), weight 117.9 kg, SpO2 94 %. Constitutional: No distress . Vital signs reviewed. HEENT: NCAT, EOMI, oral membranes moist Neck: supple Cardiovascular: RRR without murmur. No JVD    Respiratory/Chest: CTA Bilaterally without wheezes or rales. Normal effort    GI/Abdomen: BS +, non-tender, non-distended Ext: no clubbing, cyanosis, or edema Psych: flat Skin: abrasion on left knee resolving, no evidence of rash Neuro:  lethargic. Aroused with tactile stim. Speech dysarthric.  Mild left central VII, mild left hemi-facial sensory loss. LUE 2+ deltoid, 3- biceps, tricep,   3 wrist/ HI-motor exam without substantial changes. LLE:   1+ to 2 HF, KE- , 0/5 distally-stable. No major changes despite lethargy in motor exam.  Left arm and leg 1/2 light touch. Still No resting tone.  Musculoskeletal: left knee without tenderness or effusion. Right shoulder pain still present   Assessment/Plan: 1. Functional deficits which require 3+ hours per day of interdisciplinary therapy in a comprehensive inpatient rehab setting. Physiatrist is providing close team  supervision and 24 hour management of active medical problems listed below. Physiatrist and rehab team continue to assess barriers to discharge/monitor patient progress toward functional and medical goals  Care Tool:  Bathing    Body parts bathed by patient: Chest, Abdomen, Front perineal area, Face, Left arm, Right upper leg, Left upper leg   Body parts bathed by helper: Right lower leg, Left lower leg, Right arm, Front perineal area, Buttocks     Bathing assist Assist Level: Maximal Assistance - Patient 24 - 49%     Upper Body Dressing/Undressing Upper body dressing   What is the patient wearing?: Pull over shirt    Upper body assist Assist Level: Maximal Assistance - Patient 25 - 49%    Lower Body Dressing/Undressing Lower body dressing      What is the patient wearing?: Pants     Lower body assist Assist for lower body dressing: Maximal Assistance - Patient 25 - 49%     Toileting Toileting    Toileting assist Assist for toileting: 2 Helpers     Transfers Chair/bed transfer  Transfers assist  Chair/bed transfer activity did not occur: Safety/medical concerns  Chair/bed transfer assist level: 2 Helpers     Locomotion Ambulation   Ambulation assist  Ambulation activity did not occur: Safety/medical concerns          Walk 10 feet activity   Assist  Walk 10 feet activity did not occur: Safety/medical concerns        Walk 50 feet activity   Assist Walk 50 feet with 2 turns activity did not occur: Safety/medical concerns         Walk 150 feet activity   Assist Walk 150 feet activity did not occur: Safety/medical concerns         Walk 10 feet on uneven surface  activity   Assist Walk 10 feet on uneven surfaces activity did not occur: Safety/medical concerns         Wheelchair     Assist Is the patient using a wheelchair?: Yes Type of Wheelchair: Manual Wheelchair activity did not occur: Safety/medical concerns          Wheelchair 50 feet with 2 turns activity    Assist    Wheelchair 50 feet with 2 turns activity did not occur: Safety/medical concerns       Wheelchair 150 feet activity     Assist  Wheelchair 150 feet activity did not occur: Safety/medical concerns       Blood pressure 110/61, pulse 97, temperature (!) 97 F (36.1 C), resp. rate 16, height 6\' 1"  (1.854 m), weight 117.9 kg, SpO2 94 %.  Medical Problem List and Plan: 1. 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 Interdisciplinary team conference today to discuss goals, barriers to discharge, and dc planning.   2. Impaired mobility: continue Lovenox             -antiplatelet therapy: Aspirin 325 mg daily and Plavix 75 mg daily x3 months then aspirin alone 3. R shoulder pain from fall -continue Neurontin 300 mg 3 times daily, off oxycodone d/t "loopiness" -ROM, strengthening of R shoulder with therapies  -left knee xray reviewed. No injury.   4. Anxiety  Pt needs regular positive reassurance, anxious at times. Wonder if that is some of the problem at night  -may have to back of xanax d/t lethargy             -antipsychotic agents: N/A 5. Neuropsych: This patient is capable of making decisions on his own behalf. 6. Skin/Wound Care: local care as needed 7. Fluids/Electrolytes/Nutrition: encourage PO  -I personally reviewed all of the patient's labs today, and lab work is within normal limits.  8.  Dysphagia.  a advanced to regular thins  -take time, small sips 9.  Hypertension.  Norvasc 10 mg daily Avapro 75 mg daily, Toprol-XL 25 mg daily  12/24- BP within reasonable control 10.  Diabetes mellitus.  Hemoglobin A1c 9.9.  SSI, Tradjenta 5 mg daily, Glucophage  1000 mg twice daily.   CBG (last 3)  Recent Labs    03/17/21 1619 03/17/21 2126 03/18/21 0616  GLUCAP 97 158* 123*   12/27  controlled  11.  Hyperlipidemia.  Lipitor 12.  Tobacco use.  Counseling 13.  Seizure prophylaxis.  Trileptal 300 mg 3 times daily 14. Constipation-  -moving bowels-continue senna-s at HS as mtc 15. Pulmonary  edema/hypoxia-  -still requiring oxygen after moving in the right direction last week. more lethargic today -check ABG, cxr, labs -stop xanax for now -consider pulmonary consult       ADDENDUM @1100 . ABG with significant hypercarbia, hypoxic also. Continue current oxygen. Awaiting CXR.  Need weight today although last weight is down from admission to hospital.   Will ask PCCM for recs. Ordered CPAP for tonight. Continue oxygen and supportive care. Pt appears stable albeit is more lethargic today.      LOS: 19 days A FACE TO FACE EVALUATION WAS PERFORMED  03/18/2021, 9:55 AM

## 2021-03-18 NOTE — Progress Notes (Signed)
RT at bedside to obtain ABG on pt. Pt not available at this time. PT/OT working with pt and pt currently using restroom.

## 2021-03-18 NOTE — Progress Notes (Signed)
Patient ID: Brent Young, male   DOB: Oct 03, 1960, 60 y.o.   MRN: [id number]  SW sent H&P and demo sheet to [facility] for upcoming w/c eval.   SW spoke with pt son [name] [phone]) to provide updates from team conference, d/c date remains 1/6. SW informed on upcoming w/c eval and SW will inform when scheduled. SW discussed family edu. Fam edu scheduled. He is unsure if his aunt will be present. SW discussed other DME needs and if they were able to locate: [employer] and [employer]. Intends to look for items. SW informed pt will be enrolled with [employer] medication assistance program.   [name], MSW, LCSWA Office: [phone] Cell: [phone] Fax: [phone]

## 2021-03-18 NOTE — Consult Note (Signed)
° °  NAME:  Brent Young, MRN:  [id number], DOB:  Dec 11, 1960, LOS: 19 ADMISSION DATE:  02/27/2021, CONSULTATION DATE:  03/18/2021 REFERRING MD:  Dr Riley Kill, CHIEF COMPLAINT: Altered mental status, hypercarbia  History of Present Illness:  Patient currently in CIR. right basal ganglia CVA History of diabetes, hypertension, hyperlipidemia, history of TIAs.  Significant coronary artery disease, stenosis of ICA Pertinent  Medical History   Past Medical History:  Diagnosis Date   Allergy    Arthritis    Diabetes mellitus without complication (HCC)    Hypertension    Stroke (HCC)    Significant Hospital Events: Including procedures, antibiotic start and stop dates in addition to other pertinent events   Lethargy, hypercarbia  Interim History / Subjective:  Denies a history of snoring Denies significant daytime sleepiness Aware of people who use CPAP for sleep apnea and a family member with significant snoring  Alert, interactive Denies significant breathing problems , no history of breathing problems No diagnosed history of lung disease Does not smoke cigarettes, occasional weed Was more sleepy earlier in the day, did receive sedatives  Objective   Blood pressure 110/61, pulse 97, temperature (!) 97 F (36.1 C), resp. rate 16, height 6\' 1"  (1.854 m), weight 116.8 kg, SpO2 94 %.        Intake/Output Summary (Last 24 hours) at 03/18/2021 1156 Last data filed at 03/18/2021 1122 Gross per 24 hour  Intake 472 ml  Output 575 ml  Net -103 ml   Filed Weights   03/02/21 1956 03/05/21 0300 03/18/21 1107  Weight: 122.2 kg 117.9 kg 116.8 kg    Examination: General: Middle-age gentleman, does not appear to be in distress HENT: Moist oral mucosa Lungs: Clear breath sounds, reduced at the bases Cardiovascular: S1-S2 appreciated Abdomen: Soft, bowel sounds appreciated Extremities: No clubbing, no edema Neuro: Alert and oriented to person and place GU: Fair  output  7.29/72/58  Resolved Hospital Problem list     Assessment & Plan:  Hypercarbia Recent stroke Mental status is a lot better at present compared to earlier  ABG noted, patient likely borderline carbon dioxide retainer  May have obesity hypoventilation Recent stroke may also put him at risk of sleep apnea Body habitus suggest he may have sleep apnea  As he is feeling better at the present time -Watchful waiting is appropriate -He denies underlying lung disease, denies smoking history, may have sleep apnea but does deny significant symptoms of significant disease  Will suggest he gets tested for sleep apnea and treatment options when more stable Recommend caution with any medications that may contribute to sedation  If mentation is to worsen, may benefit from BiPAP BiPAP 15/5 may be tried  We do not need to repeat a blood gas unless mental status does not continue to improve  Thank you for the consultation  7 May, MD [place] [facility] Pager: See Virl Diamond

## 2021-03-19 ENCOUNTER — Inpatient Hospital Stay (HOSPITAL_COMMUNITY): Payer: Medicaid Other

## 2021-03-19 LAB — GLUCOSE, CAPILLARY
Glucose-Capillary: 101 mg/dL — ABNORMAL HIGH (ref 70–99)
Glucose-Capillary: 91 mg/dL (ref 70–99)
Glucose-Capillary: 94 mg/dL (ref 70–99)
Glucose-Capillary: 97 mg/dL (ref 70–99)
Glucose-Capillary: 133 mg/dL — ABNORMAL HIGH (ref 70–99)

## 2021-03-19 LAB — URINE CULTURE: Culture: NO GROWTH

## 2021-03-19 NOTE — Progress Notes (Signed)
PROGRESS NOTE   Subjective/Complaints: Much more alert this morning. Up and down last night per nurse notes. Pt refused BIPAP last night as it was "too much air/pressure"   ROS: Patient denies fever, rash, sore throat, blurred vision, nausea, vomiting, diarrhea, cough, shortness of breath or chest pain, joint or back pain, headache, or mood change.    Objective:   DG Chest 2 View  Result Date: 03/18/2021 CLINICAL DATA:  60 year old male with stroke EXAM: CHEST - 2 VIEW COMPARISON:  03/11/2021 FINDINGS: Cardiomediastinal silhouette unchanged in size and contour with cardiomegaly. Similar appearance of mixed interstitial and airspace disease of the bilateral lungs, with no new region of confluent airspace disease. No large pleural effusion. No pneumothorax. Fullness in the central vasculature. No displaced fracture.  Degenerative changes of the spine IMPRESSION: Persistently low lung volumes, with similar distribution of mixed interstitial and airspace disease bilaterally, most compatible with atelectasis/consolidation. Electronically Signed   By: Gilmer Mor D.O.   On: 03/18/2021 13:13   Recent Labs    03/18/21 0927  WBC 8.7  HGB 9.8*  HCT 30.6*  PLT 253    Recent Labs    03/18/21 0927  NA 140  K 3.7  CL 100  CO2 32  GLUCOSE 145*  BUN 12  CREATININE 0.69  CALCIUM 9.0       Intake/Output Summary (Last 24 hours) at 03/19/2021 0857 Last data filed at 03/18/2021 2228 Gross per 24 hour  Intake 708 ml  Output 550 ml  Net 158 ml        Physical Exam: Vital Signs Blood pressure (!) 143/82, pulse 90, temperature 98 F (36.7 C), resp. rate 16, height 6\' 1"  (1.854 m), weight 116.8 kg, SpO2 97 %. Constitutional: No distress . Vital signs reviewed. obese HEENT: NCAT, EOMI, oral membranes moist Neck: supple Cardiovascular: RRR without murmur. No JVD    Respiratory/Chest: CTA Bilaterally without wheezes or rales.  Normal effort    GI/Abdomen: BS +, non-tender, non-distended Ext: no clubbing, cyanosis, or edema Psych: pleasant and cooperative  Skin: left knee abrasion resolving Neuro:  more alert. Still dysartrhic.  Mild left central VII, mild left hemi-facial sensory loss. LUE 2+ deltoid, 3- biceps, tricep,   3 wrist/ HI-motor exam without substantial changes. LLE:   1+ to 2 HF, KE- , 0/5 distally-stable. No major changes despite lethargy in motor exam.  Left arm and leg 1/2 light touch. Still No resting tone.  Musculoskeletal: left knee without tenderness or effusion. Right shoulder pain still present   Assessment/Plan: 1. Functional deficits which require 3+ hours per day of interdisciplinary therapy in a comprehensive inpatient rehab setting. Physiatrist is providing close team supervision and 24 hour management of active medical problems listed below. Physiatrist and rehab team continue to assess barriers to discharge/monitor patient progress toward functional and medical goals  Care Tool:  Bathing    Body parts bathed by patient: Chest, Abdomen, Front perineal area, Face, Left arm, Right upper leg, Left upper leg   Body parts bathed by helper: Right lower leg, Left lower leg, Right arm, Front perineal area, Buttocks     Bathing assist Assist Level: Maximal Assistance - Patient 24 -  49%     Upper Body Dressing/Undressing Upper body dressing   What is the patient wearing?: Pull over shirt    Upper body assist Assist Level: Maximal Assistance - Patient 25 - 49%    Lower Body Dressing/Undressing Lower body dressing      What is the patient wearing?: Pants     Lower body assist Assist for lower body dressing: Maximal Assistance - Patient 25 - 49%     Toileting Toileting    Toileting assist Assist for toileting: 2 Helpers     Transfers Chair/bed transfer  Transfers assist  Chair/bed transfer activity did not occur: Safety/medical concerns  Chair/bed transfer assist level:  2 Helpers     Locomotion Ambulation   Ambulation assist   Ambulation activity did not occur: Safety/medical concerns          Walk 10 feet activity   Assist  Walk 10 feet activity did not occur: Safety/medical concerns        Walk 50 feet activity   Assist Walk 50 feet with 2 turns activity did not occur: Safety/medical concerns         Walk 150 feet activity   Assist Walk 150 feet activity did not occur: Safety/medical concerns         Walk 10 feet on uneven surface  activity   Assist Walk 10 feet on uneven surfaces activity did not occur: Safety/medical concerns         Wheelchair     Assist Is the patient using a wheelchair?: Yes Type of Wheelchair: Manual Wheelchair activity did not occur: Safety/medical concerns         Wheelchair 50 feet with 2 turns activity    Assist    Wheelchair 50 feet with 2 turns activity did not occur: Safety/medical concerns       Wheelchair 150 feet activity     Assist  Wheelchair 150 feet activity did not occur: Safety/medical concerns       Blood pressure (!) 143/82, pulse 90, temperature 98 F (36.7 C), resp. rate 16, height 6\' 1"  (1.854 m), weight 116.8 kg, SpO2 97 %.  Medical Problem List and Plan: 1. 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 Impaired mobility: continue Lovenox             -antiplatelet therapy: Aspirin 325 mg daily and Plavix 75 mg daily x3 months then aspirin alone 3. R shoulder pain from fall -continue Neurontin 300 mg 3 times daily, off oxycodone d/t "loopiness" -ROM, strengthening of R shoulder with therapies  -left knee xray reviewed. No injury.   4. Anxiety  Pt needs regular positive reassurance,  anxious at times. Wonder if that is some of the problem at night  -may have to back of xanax d/t lethargy             -antipsychotic agents: N/A 5. Neuropsych: This patient is capable of making decisions on his own behalf. 6. Skin/Wound Care: local care as needed 7. Fluids/Electrolytes/Nutrition: encourage PO  .  8.  Dysphagia.  a advanced to regular thins. SLP signed off  -take time, small sips 9.  Hypertension.  Norvasc 10 mg daily Avapro 75 mg daily, Toprol-XL 25 mg daily  12/24- BP within reasonable control 10.  Diabetes mellitus.  Hemoglobin A1c 9.9.  SSI, Tradjenta 5 mg daily, Glucophage 1000 mg twice daily.   CBG (last 3)  Recent Labs    03/18/21 1613 03/18/21 2123 03/19/21 0546  GLUCAP 104* 102* 101*   12/28  controlled  11.  Hyperlipidemia.  Lipitor 12.  Tobacco use.  Counseling 13.  Seizure prophylaxis.  Trileptal 300 mg 3 times daily 14. Constipation-  -moving bowels-continue senna-s at HS as mtc 15. Hypercarbia/hypoxia, likely OSA  -CXR yesterday simlilar to others -appreciate PCCM consult -didn't tolerate BIPAP. Pt argued that he doesn't have sleep apnea. I told him the numbers say otherwise as well other factors. Will order bipap at lower inspiratory pressure tonight. Pt said he would give it a try -OOB, IS, FV -wean oxygen as able -needs outpt sleep study       ADDENDUM @1100 . ABG with significant hypercarbia, hypoxic also. Continue current oxygen. Awaiting CXR.  Need weight today although last weight is down from admission to hospital.   Will ask PCCM for recs. Ordered CPAP for tonight. Continue oxygen and supportive care. Pt appears stable albeit is more lethargic today.      LOS: 20 days A FACE TO FACE EVALUATION WAS PERFORMED  03/19/2021, 8:57 AM

## 2021-03-19 NOTE — Progress Notes (Signed)
Patient has fallen asleep at 5:30 A.M Pt called multiple times throughout the night, Pt encouraged to get rest and go to sleep so he could participate in therapy sessions. Pt was told that he could get better sleep if TV was off but patient denied, stating "he wanted to keep the TV on and that he is a light sleeper and wakes up every time some one walks by in the hallway but continued to call without any hallway activity. Pt is currently sleeping with nasal cannula in place, call light within reach, bed on lowest setting.

## 2021-03-19 NOTE — Progress Notes (Signed)
Patient transferred via bed to CT Scan per orders, no c/o or noted distress. Tele monitor arrived to department awaiting patients arrival back to room to connect.  2215 Patient returned from CT, alert, oriented and cooperative, denies discomfort or pain, repositioned in room, assigned nurse informed  to notify the Tele- monitoring system

## 2021-03-19 NOTE — Progress Notes (Signed)
Occupational Therapy Session Note  Patient Details  Name: Brent Young MRN: [id number] Date of Birth: April 15, 1960  Today's Date: 03/19/2021 OT Individual Time: 1405-1430 OT Individual Time Calculation (min): 25 min    Short Term Goals: Week 3:  OT Short Term Goal 1 (Week 3): Pt will be able to roll onto his R side with min A to A caregivers. OT Short Term Goal 2 (Week 3): Patient will recall hemi dressing techniques with min verbal cues OT Short Term Goal 3 (Week 3): Patient will perform toilet transfer with max A of 1 person  Skilled Therapeutic Interventions/Progress Updates:    Pt resting in TIS w/c upon arrival. Pt reports Lt shoulder pain (see below). OT intervention with focus on LUE AAROM and forced use. Pt with active movement but does not attend when not engaged functionally. Focus on positioning and Kinesio Tape application for pain mgmt. Kinesio Tape on Lt hand. Pt remained in TIS w/c with belt alarm activated. All needs within reach.   Therapy Documentation Precautions:  Precautions Precautions: Fall Precaution Comments: BP goal 130-160 per neuro MD; Dense L side deficits Other Brace: AFO for LLE Restrictions Weight Bearing Restrictions: No Pain:  Pt c/o Lt shoulder pain; repositioned and Kinesio Tape  Therapy/Group: Individual Therapy  Rich Brave 03/19/2021, 2:39 PM

## 2021-03-19 NOTE — Progress Notes (Addendum)
Call from attendant RN that patient fell when transferring from [facility] to toilet with nurse tech using Maxi-Move. Patient did not strike head and is without injury. In bed, awake alert and in no distress.

## 2021-03-19 NOTE — Progress Notes (Signed)
° °  NAME:  Brent Young, MRN:  [id number], DOB:  05-30-1960, LOS: 20 ADMISSION DATE:  02/27/2021, CONSULTATION DATE:  03/18/21 REFERRING MD:  Dr Riley Kill, CHIEF COMPLAINT: Altered mental status, hypercarbia  History of Present Illness:  Patient currently in CIR. right basal ganglia CVA History of diabetes, hypertension, hyperlipidemia, history of TIAs.  Significant coronary artery disease, stenosis of ICA  Pertinent  Medical History   Past Medical History:  Diagnosis Date   Allergy    Arthritis    Diabetes mellitus without complication (HCC)    Hypertension    Stroke (HCC)    Significant Hospital Events: Including procedures, antibiotic start and stop dates in addition to other pertinent events   12/27 lethargy, hypercarbia noted  Interim History / Subjective:  Awake alert and interactive Did not tolerate BiPAP-states he just cannot have that on his face and be able to sleep  Objective   Blood pressure (!) 143/82, pulse 90, temperature 98 F (36.7 C), resp. rate 16, height 6\' 1"  (1.854 m), weight 116.8 kg, SpO2 97 %.        Intake/Output Summary (Last 24 hours) at 03/19/2021 1146 Last data filed at 03/18/2021 2228 Gross per 24 hour  Intake 472 ml  Output 300 ml  Net 172 ml   Filed Weights   03/02/21 1956 03/05/21 0300 03/18/21 1107  Weight: 122.2 kg 117.9 kg 116.8 kg    Examination: General: Middle-age gentleman, does not appear to be in distress HENT: Moist oral mucosa Lungs: Clear breath sounds Cardiovascular: S1-S2 appreciated Abdomen: Soft, bowel sounds appreciated Extremities: No clubbing, no edema Neuro: Alert and oriented x3 GU: Fair output  No new significant labs  Resolved Hospital Problem list     Assessment & Plan:  Hypercarbia Recent stroke Mental status continues to improve  ABG significant for hypercapnic failure  May have underlying obesity hypoventilation -I anticipate this will be difficult to treat as he may not tolerate pressure  therapy -He gets very agitated and will likely not use device  May still be encouraged to use BiPAP if any significant alteration in his mental status -Regardless of mode BiPAP or CPAP, he stated he just cannot tolerate anything over his face  Not much more to add at present  Pulmonary will sign off  Call, reach out as needed  03/20/21, MD [place] [facility] Pager: See Virl Diamond

## 2021-03-19 NOTE — Progress Notes (Signed)
Physical Therapy Session Note  Patient Details  Name: Brent Young MRN: [id number] Date of Birth: [birth year]  Today's Date: 03/19/2021 PT Individual Time: 1101-1200 and 8016-5537 PT Individual Time Calculation (min): 59 min and 54 min  Short Term Goals: Week 3:  PT Short Term Goal 1 (Week 3): Pt will perform bed mobility with modA +1. PT Short Term Goal 2 (Week 3): Pt will perform sit to stand with modA +1. PT Short Term Goal 3 (Week 3): Pt will perform bed to chair transfer with mod A+1. PT Short Term Goal 4 (Week 3): Pt will initiate gait training.  Skilled Therapeutic Interventions/Progress Updates:     1st Session: Pt received seated in [facility] and agrees to therapy. No complaint of pain. Pt requests to use rest room prior to mobility. Sit to stand with Stedy and modA +2, with cues for body mechanics, hand placement, and initiation. Stedy transfer to [facility] with totalA. Following toileting, pt performs sit to stand to [place] with maxA +1 and remains standing with min to modA+2 while PT performs pericare. Stedy transfer back to [facility].   Pt performs gait trials in hallway using R handrail. PT provides underarm support on L side, with +2 assistance at trunk due to pt's lack of trunk control and tendency to flex forward at hips. +3 for WC follow and management of O2 tank. Pt ambulates 18' x3 with maxA +2 and +3 WC follow. PT facilitates L lateral weight shift, blocking of L knee during stance phase and progression of L leg during swing phase, L glute engagement during stance phase, cueing at trunk to increase extension through gait cycle, utilizing reciprocal gait pattern to promote optimal gait mechanics and weight shifting. Pt requires extended seated rest breaks between each bout.   Pt left seated in WC with alarm intact and all needs within reach.  2nd Session: Pt received seated in [facility] and agrees to therapy. No complaint of pain. WC transport to gym for time management. PT positions harness for  Litegait around pt's torso and provides maxA for sit to stand with bilateral upper extremity support on handles, with cues for anterior weight shifting and hip extension. Pt has heavy L lean and minimal engagement of L lower extremity during initial stand. Pt brought back into sitting and harness repositioned. Pt then stands with modA +2 and is able to achieve more neutral posturing to attach harness to litegait. Mirror provided for visual feedback. Pt works on weight shifting to the R to limit L pushing tendency. Pt reaches for targets outside of BOS on the R with PT providing tactile cueing at L quad and L hip for increased motor activation. Pt then works on reaching across body to the L to promote L lateral weight shifting and increased WB through L lower extremity. Pt progresses to performing anterior and posterior stepping with R lower extremity with PT facilitating weight shifting to the L, with consistent cueing for hip and trunk extension. Pt has palpable contractions in L extensor groups but requires cueing to remind pt to engage L hemibody. Stepping progressed to performing adduction stepping for increased L sided WB. Pt attempts performing activity without use of R upper extremity but has difficulty maintaining trunk control without use of R hand, instead hanging in harness with minimal lower extremity engagement. Pt lowered back to tilt in space WC with modA +2. Left seated in WC with alarm intact and all needs within reach.  Therapy Documentation Precautions:  Precautions Precautions: Fall Precaution  Comments: BP goal 130-160 per neuro MD; Dense L side deficits Other Brace: AFO for LLE Restrictions Weight Bearing Restrictions: No    Therapy/Group: Individual Therapy  Beau Fanny, PT, DPT 03/19/2021, 2:27 PM

## 2021-03-19 NOTE — Progress Notes (Signed)
Occupational Therapy Session Note  Patient Details  Name: Brent Young MRN: [id number] Date of Birth: [birth year]  Today's Date: 03/19/2021 OT Individual Time: 0850-1000 OT Individual Time Calculation (min): 70 min    Short Term Goals: Week 3:  OT Short Term Goal 1 (Week 3): Pt will be able to roll onto his R side with min A to A caregivers. OT Short Term Goal 2 (Week 3): Patient will recall hemi dressing techniques with min verbal cues OT Short Term Goal 3 (Week 3): Patient will perform toilet transfer with max A of 1 person  Skilled Therapeutic Interventions/Progress Updates:  Patient met lying supine in bed in agreement with OT treatment session. No indication of pain at rest or with activity. Patient found to be incontinent of bowel and seemingly unaware requiring Max A for hygiene/clothing management at bed level. Supine to EOB with Max A and Max A for sit to stand in [place] with EOB elevated. Patient transferred to bariatric roll-in shower chair in walk-in shower with Mod-Max A for sit to stand from perched position in stedy. Mod A for UB bathing with cues for hemi technique and Max A for LB bathing in sitting utilizing hole in shower chair to wash buttocks. Patient requires cues for use of affected LUE demonstrating continued ataxia limiting functional use of LUE. UB dressing with Mod-Max A and LB dressing with Max to Total A. Oral hygiene seated at sink level. Session concluded with patient seated in TIS wc with call bell within reach, belt alarm activated and all needs met.   Therapy Documentation Precautions:  Precautions Precautions: Fall Precaution Comments: BP goal 130-160 per neuro MD; Dense L side deficits Other Brace: AFO for LLE Restrictions Weight Bearing Restrictions: No General:    Therapy/Group: Individual Therapy  [name] 03/19/2021, 7:02 AM

## 2021-03-19 NOTE — Progress Notes (Signed)
Notified staffing regarding Tele-sitter monitor , already aware will notifiy portable to bring monitor to department

## 2021-03-19 NOTE — Progress Notes (Signed)
Pt was found numerous times throughout the night with nasal cannula off. Nurse encouraged pt to put it back on every time and patient would agree. Pt agreed to wear BiPAP at night but later refused stating "It's too much force in my nose". Pt called frequently all night for things like to sit up in bed, slide down, take clothes off, put clothes on. Nurse educated pt on importance of trying to get a good nights rest so he can participate in therapy sessions in AM. Pt stated he agreed but continue to call throughout the night.

## 2021-03-19 NOTE — Progress Notes (Signed)
°   03/19/21 1907  What Happened  Was fall witnessed? No  Patients activity before fall other (comment) (in bed)  Point of contact arm/shoulder;head  Was patient injured? Unsure (pt head hurts)  Patient found on floor  Found by Staff-comment [facility])  Stated prior activity other (comment) (laying in bed)  Follow Up  MD notified  Dois Davenport, [place])  Time MD notified [phone]  Family notified Yes - comment  Additional tests Yes-comment (CT scan ordered for head)  Progress note created (see row info) Yes  Adult Fall Risk Assessment  Risk Factor Category (scoring not indicated) Fall has occurred during this admission (document High fall risk)  Age 23  Fall History: Fall within 6 months prior to admission 5  Elimination; Bowel and/or Urine Incontinence 0  Elimination; Bowel and/or Urine Urgency/Frequency 2  Medications: includes PCA/Opiates, Anti-convulsants, Anti-hypertensives, Diuretics, Hypnotics, Laxatives, Sedatives, and Psychotropics 5  Patient Care Equipment 2  Mobility-Assistance 2  Mobility-Gait 2  Mobility-Sensory Deficit 0  Altered awareness of immediate physical environment 0  Impulsiveness 0  Lack of understanding of one's physical/cognitive limitations 0  Total Score 19  Patient Fall Risk Level High fall risk  Adult Fall Risk Interventions  Required Bundle Interventions *See Row Information* High fall risk - low, moderate, and high requirements implemented  Additional Interventions PT/OT need assessed if change in mobility from baseline  Screening for Fall Injury Risk (To be completed on HIGH fall risk patients) - Assessing Need for Floor Mats  Risk For Fall Injury- Criteria for Floor Mats Admitted as a result of a fall  Will Implement Floor Mats Yes  Vitals  Temp 97.7 F (36.5 C)  BP (!) 163/84  MAP (mmHg) 108  BP Location Right Arm  BP Method Automatic  Patient Position (if appropriate) Lying  Pulse Rate (!) 104  Pulse Rate Source Monitor  Resp 20  Oxygen Therapy   SpO2 94 %  O2 Device Room Air  Pain Assessment  Pain Scale 0-10  Pain Score 2  Pain Type Acute pain  Pain Location Head  Pain Descriptors / Indicators Aching  Pain Onset Sudden  Patients Stated Pain Goal 0  Pain Intervention(s) Rest  Multiple Pain Sites No  PCA/Epidural/Spinal Assessment  Respiratory Pattern Regular;Labored  Neurological  Neuro (WDL) X  Level of Consciousness Alert  Orientation Level Oriented X4  Cognition Appropriate attention/concentration  Speech Clear  Pupil Assessment  No  Additional Pupil Assessments No  Motor Function/Sensation Assessment Grip  R Hand Grip Present;Moderate  L Hand Grip Present;Weak   Neuro Symptoms Fatigue  Neuro symptoms relieved by Rest  Neuro Additional Assessments No  Glasgow Coma Scale  Eye Opening 4  Best Verbal Response (NON-intubated) 5  Best Motor Response 6  Glasgow Coma Scale Score 15  Musculoskeletal  Musculoskeletal (WDL) X  Assistive Device MaxiMove;Stedy  Generalized Weakness Yes  Weight Bearing Restrictions No  Integumentary  Integumentary (WDL) X  Skin Color Appropriate for ethnicity  Skin Condition Dry  Skin Turgor Non-tenting  Pain Assessment  Result of Injury No  Pain Assessment  Work-Related Injury No

## 2021-03-19 NOTE — Progress Notes (Signed)
°   03/19/21 1537  What Happened  Was fall witnessed? Yes  Who witnessed fall? Ari, NT  Patients activity before fall bathroom-assisted  Point of contact buttocks  Was patient injured? No  Patient found other (Comment) (pt was assisted)  Found by Staff-comment (Ari in room, assisted fall)  Stated prior activity other (comment) (pt assisted with urinal)  Follow Up  MD notified  Dois Daven[place], [place])  Time MD notified [phone]  Additional tests No  Progress note created (see row info) Yes  Adult Fall Risk Assessment  Risk Factor Category (scoring not indicated) Fall has occurred during this admission (document High fall risk)  Age 60  Fall History: Fall within 6 months prior to admission 5  Elimination; Bowel and/or Urine Incontinence 0  Elimination; Bowel and/or Urine Urgency/Frequency 2  Medications: includes PCA/Opiates, Anti-convulsants, Anti-hypertensives, Diuretics, Hypnotics, Laxatives, Sedatives, and Psychotropics 5  Patient Care Equipment 2  Mobility-Assistance 2  Mobility-Gait 2  Mobility-Sensory Deficit 0  Altered awareness of immediate physical environment 0  Impulsiveness 0  Lack of understanding of one's physical/cognitive limitations 0  Total Score 19  Patient Fall Risk Level High fall risk  Adult Fall Risk Interventions  Required Bundle Interventions *See Row Information* High fall risk - low, moderate, and high requirements implemented  Additional Interventions PT/OT need assessed if change in mobility from baseline  Screening for Fall Injury Risk (To be completed on HIGH fall risk patients) - Assessing Need for Floor Mats  Risk For Fall Injury- Criteria for Floor Mats Admitted as a result of a fall  Will Implement Floor Mats Yes  Oxygen Therapy  O2 Device Nasal Cannula  O2 Flow Rate (L/min) 2 L/min  Patient Activity (if Appropriate) In bed  Pain Assessment  Pain Scale 0-10  Pain Score 0  PCA/Epidural/Spinal Assessment  Respiratory Pattern Regular;Labored   Neurological  Neuro (WDL) X  Level of Consciousness Alert  Orientation Level Oriented X4  Cognition Appropriate attention/concentration;Follows commands  Speech Clear  Pupil Assessment  No  Additional Pupil Assessments No  Motor Function/Sensation Assessment Grip  R Hand Grip Present;Moderate  L Hand Grip Present;Weak   Neuro Symptoms Fatigue  Neuro symptoms relieved by Rest  Neuro Additional Assessments No  Glasgow Coma Scale  Eye Opening 4  Best Verbal Response (NON-intubated) 5  Best Motor Response 6  Glasgow Coma Scale Score 15  Musculoskeletal  Musculoskeletal (WDL) X  Assistive Device MaxiMove;Stedy  Generalized Weakness Yes  Weight Bearing Restrictions No  Integumentary  Integumentary (WDL) X  Skin Color Appropriate for ethnicity  Skin Condition Dry  Skin Turgor Non-tenting  Pain Assessment  Result of Injury No  Pain Assessment  Work-Related Injury No

## 2021-03-20 LAB — GLUCOSE, CAPILLARY
Glucose-Capillary: 92 mg/dL (ref 70–99)
Glucose-Capillary: 88 mg/dL (ref 70–99)
Glucose-Capillary: 99 mg/dL (ref 70–99)
Glucose-Capillary: 105 mg/dL — ABNORMAL HIGH (ref 70–99)

## 2021-03-20 LAB — CREATININE, SERUM
Creatinine, Ser: 0.58 mg/dL — ABNORMAL LOW (ref 0.61–1.24)
GFR, Estimated: >60 mL/min (ref >60)

## 2021-03-20 NOTE — Progress Notes (Signed)
Patient A&O x 2-3. Patient is disoriented to time. Pt has called multiple times throughout the night asking when therapy will start even though he was told it was the middle of the night (2-3 am) multiple times. Educated patient on relaxation techniques to help patient fall asleep. Pt has impaired judgement, pt urinated on floor stating that he missed the urinal, but urinal wasn't in his hand. Patient has poor safety awareness - Pt fell twice yesterday. MD notified of changes in mental status.

## 2021-03-20 NOTE — Progress Notes (Signed)
Occupational Therapy Session Note  Patient Details  Name: Brent Young MRN: [id number] Date of Birth: 05-27-1960  Today's Date: 03/20/2021 sESSION 1 OT Individual Time: 1005-1100 OT Individual Time Calculation (min): 55 min   sESSION 2 OT Individual Time: 1405-1503 OT Individual Time Calculation (min): 58 min    Short Term Goals: Week 3:  OT Short Term Goal 1 (Week 3): Pt will be able to roll onto his R side with min A to A caregivers. OT Short Term Goal 2 (Week 3): Patient will recall hemi dressing techniques with min verbal cues OT Short Term Goal 3 (Week 3): Patient will perform toilet transfer with max A of 1 person  Skilled Therapeutic Interventions/Progress Updates:  Session 1   Pt greeted semi-reclined in TIS wc and agreeable to OT treatment session. Pt on 2L of O2 throughout session. Pt still intermittently still confused and had needed cues to recall therapy session from earlier today. Pt reported he thought we had a protocol where he could just get out of bed when he wanted to. OT educated on HIGH fall risk and need for assistance with all transfers and bed mobility. Pt completed bed mobility with max A of 1. Pt's son hunter then entered the room and OT educated on Stedy transfer to [facility]. We will try slideboard transfer to [facility] at next family ed session. Mod +2 sit><stand in [place]  then pt brought to [facility] against the wall. OT educated on body mechanics and positioning to assist with standing from Stedy surface and managing clothing. OT educated on need for 2 people with transfers and especially toiletting tasks. Pt voided bladder but no BM. Max +2 to get into standing from [facility], then pt was able to remove R UE from [facility] bar to wash buttocks while pt's son provided max A to maintain standing. Pt then brought to the sink in [place] and worked on core strength to maintain midline perched on stedy seat, while performing UB bathing/dressing with min A. OT assistance with threading pant legs  for time management, then worked on sit<>stands at the sink with max A +2. OT educated son on body mechanics, anterior weight shift, and L knee block to get to standing. Pt with strong push when standing with son requiring +2 to maintain balance. Pt left seated in wc with handoff to PT for next therapy session.   Denies pain  Session 2 Pt greeted semi-reclined in bed with son present. Practiced bed mobility from flat surface. Pt with heavy use of bed rail and foot rail to help pull himself up with max A of 1. Footboard used strongly for scooting to EOB. OT reviewed slideboard transfer postioning of slideboard and body mechanics with pt's son [name]. Max +2 slideboard transfer to drop arm wc. Pt brought to the therapy apartment and practiced sit<>stands at the counter with cabinets used to help block knees. OT educated heavily on body mechanics for [name] to maintain L LE positioning and be ready for pushing to the L. Pt needed max A +2 with strong push to the L. Had pt reach for the wall on R side to help cue weight shift and achieve midline. Practiced multiple sit<>stands with [name] to help him get a feel for assist level. Pt returned to room and requested to stay up in TIS wc. Pt tilted far back and seat belt alarm on to keep hips back in chair as well. Pt left tilted in TIS with son present, alarm on, and needs met.  Therapy Documentation Precautions:  Precautions Precautions: Fall Precaution Comments: BP goal 130-160 per neuro MD; Dense L side deficits Other Brace: AFO for LLE Restrictions Weight Bearing Restrictions: No\ Pain:  Denies pain   Therapy/Group: Individual Therapy  Valma Cava 03/20/2021, 3:12 PM

## 2021-03-20 NOTE — Progress Notes (Signed)
Attempted bipap with settings 8/4 per order.  Pt unable to tol.  Switched to nasal mask with no improvement.  Pt refuses to wear tonight.

## 2021-03-20 NOTE — Progress Notes (Signed)
Physical Therapy Session Note  Patient Details  Name: Brent Young MRN: [id number] Date of Birth: 1960-05-23  Today's Date: 03/20/2021 PT Individual Time: 0802-0829 and 1106-1200 PT Individual Time Calculation (min): 27 min and 54 min  Short Term Goals: Week 3:  PT Short Term Goal 1 (Week 3): Pt will perform bed mobility with modA +1. PT Short Term Goal 2 (Week 3): Pt will perform sit to stand with modA +1. PT Short Term Goal 3 (Week 3): Pt will perform bed to chair transfer with mod A+1. PT Short Term Goal 4 (Week 3): Pt will initiate gait training.  Skilled Therapeutic Interventions/Progress Updates:     1st Session: Pt received supine in bed and agrees to therapy. No complaint of pain. PT dons bilateral ted hose and pants in supine. Pt performs supine to sit to R side of bed with maxA and cues for sequencing and body mechanics. Pt performs sliding board transfer to tilt in space WC with maxA +2 and cues for body mechanics, sequencing, and initiation. WC transport to gym for time management. Pt performs sit to stand in [place] with maxA +2 and remains standing ~2 minutes with cues for hip extension, knee extension, and neutral posture. Pt left seated in TIS WC at RN station. All needs within reach.  2nd Session: Pt received seated in [facility], working with OT and pt's son. Handed off to PT. Pt agreeable to therapy and no complaint of pain. Son present for family education. PT educates on importance of using tilt in space WC and maintaining adequate angle of seat for safety, as pt lacks trunk control and tends to push hips forward off seat, especially when level. WC transport to gym for time management. Pt performs sliding board transfer to mat with PT providing assistance from pt's front while son provides assistance from behind pt, with PT providing cues to son on body mechanics, and cues to pt on head hips relationship, initiation, sequencing, and positioning. Pt works on sitting balance while  seated at edge of mat with mirror for visual feedback, with PT providing cues on posture and educating son on sitting to L of pt due to pt's tendency to push to L. Pt works on trunk leans posterior to anterior with tactile cues for optimal mechanics and safety. Pt then performs x2 reps of sit to stand with maxA +1. In standing, pt performs lateral weight shifting with goal of WB through L lower extremity for NM return and achieving neutral posture. Pt able to maintain standing 2-3 minutes at a time before requiring rest break. Pt performs sliding board transfer back to [facility] with son providing primary assistance and PT providing secondary manual assist behind pt. MaxA +2 overall. Pt left seated in WC with alarm intact and all needs within reach.  Therapy Documentation Precautions:  Precautions Precautions: Fall Precaution Comments: BP goal 130-160 per neuro MD; Dense L side deficits Other Brace: AFO for LLE Restrictions Weight Bearing Restrictions: No    Therapy/Group: Individual Therapy  Beau Fanny, PT, DPT 03/20/2021, 4:15 PM

## 2021-03-20 NOTE — Progress Notes (Signed)
Patient ID: Brent Young, male   DOB: Feb 07, 1961, 60 y.o.   MRN: [id number]  SW received updates from attending pt will need home o2 and Bipap/trilogy machine at discharge as unable to wean off oxygen. Recent ABG test supports possible sleep apnea.   SW spoke with Brent Young to discuss patient above needs since uninsured. Will need to see if pt is eligible for LOG.   SW waiting on follow-up from Brent Young/TOC supervisor to see if appropriate.   *SW met with pt and pt son Brent Young in the room to discuss discharge needs. SW informed on above with regard to oxygen needs and waiting on if pt will be appropriate for LOG. SW explained charity process with [facility]. SW informed will continue to provide updates. Pt will confirm with SW if his sister will be here on Monday for family edu. SW discussed with pt and pt son to apply for SSDI. Pt reports that he already receives spousal benefits as SSI. SW encouraged him to speak with assigned CM to discuss benefit options in more detail.   Pt set up for [employer]. SW ordered hospital bed and DABSC with [facility].   [name], MSW, [place] Office: [phone] Cell: [phone] Fax: [phone]

## 2021-03-20 NOTE — Progress Notes (Signed)
This nurse entered room after patient returned from Ct scan to give night meds, do vitals, and as well as Neuro assess. Patient BiPAP was on but still sitting bedside as if the power button was just turned on. Patient was also fidgeting with nasal cannula trying to put it back around his nose. This nurse asked patient if he wanted to put on BiPAP and patient refused. Patient stated that someone already came in here to put it on him and stated "Someone told me to just wear my oxygen and they will bring a different mask tomorrow". There is no evidence that RT or any staff has been in room to encourage BiPAP other than this nurse. Offered BiPAP again and explained why he needed it and patient became agitated and refused.

## 2021-03-20 NOTE — Progress Notes (Signed)
Spoke with patient about why he refused BiPAP machine after RT was unsuccessful. Pt stated "IT wasn't his fault, the machine isn't working properly, it turns on when I stop breathing and it turns off when I do breathe". Pt seemed confused when nurse asked for elaboration. Spoke outside the room with patient son [name] about why it's important for Pt. to wear BiPAP and son agreed. Pt agreed to allow this nurse to don nasal mask and turn on BiPAP. Patient tolerated mask for about 2 minutes before he wanted it off stating that "this mask stops me from breathing, see if I open my mouth the oxygen just comes out of my mouth and I can't breath". Offered patient BiPAP that covers mouth and re-educated patient about what the machine does, why he needs it, and how it will benefit him. Pt refused, son did not encourage patient to wear mask while this nurse was in the room.

## 2021-03-20 NOTE — Progress Notes (Signed)
PROGRESS NOTE   Subjective/Complaints: Pt alert this morning but somewhat disoriented. Definitely disoriented overnight. Reported to me that he took 18 steps yesterday   ROS: Limited due to cognitive/behavioral    Objective:   DG Chest 2 View  Result Date: 03/18/2021 CLINICAL DATA:  60 year old male with stroke EXAM: CHEST - 2 VIEW COMPARISON:  03/11/2021 FINDINGS: Cardiomediastinal silhouette unchanged in size and contour with cardiomegaly. Similar appearance of mixed interstitial and airspace disease of the bilateral lungs, with no new region of confluent airspace disease. No large pleural effusion. No pneumothorax. Fullness in the central vasculature. No displaced fracture.  Degenerative changes of the spine IMPRESSION: Persistently low lung volumes, with similar distribution of mixed interstitial and airspace disease bilaterally, most compatible with atelectasis/consolidation. Electronically Signed   By: Gilmer Mor D.O.   On: 03/18/2021 13:13   CT HEAD WO CONTRAST ( )  Result Date: 03/19/2021 CLINICAL DATA:  Chronic headache EXAM: CT HEAD WITHOUT CONTRAST TECHNIQUE: Contiguous axial images were obtained from the base of the skull through the vertex without intravenous contrast. COMPARISON:  02/16/2021 FINDINGS: Brain: There is no mass, hemorrhage or extra-axial collection. There is generalized atrophy without lobar predilection. Hypodensity of the white matter is most commonly associated with chronic microvascular disease. There is an old infarct of the posterior right basal ganglia. Vascular: Atherosclerotic calcification of the vertebral and internal carotid arteries at the skull base. No abnormal hyperdensity of the major intracranial arteries or dural venous sinuses. Skull: The visualized skull base, calvarium and extracranial soft tissues are normal. Sinuses/Orbits: No fluid levels or advanced mucosal thickening of the  visualized paranasal sinuses. No mastoid or middle ear effusion. The orbits are normal. IMPRESSION: 1. No acute intracranial abnormality. 2. Old right basal ganglia infarct and findings of chronic microvascular ischemia. Electronically Signed   By: Deatra Robinson M.D.   On: 03/19/2021 22:09   Recent Labs    03/18/21 0927  WBC 8.7  HGB 9.8*  HCT 30.6*  PLT 253    Recent Labs    03/18/21 0927 03/20/21 0551  NA 140  --   K 3.7  --   CL 100  --   CO2 32  --   GLUCOSE 145*  --   BUN 12  --   CREATININE 0.69 0.58*  CALCIUM 9.0  --        Intake/Output Summary (Last 24 hours) at 03/20/2021 1002 Last data filed at 03/20/2021 0600 Gross per 24 hour  Intake 20 ml  Output 600 ml  Net -580 ml        Physical Exam: Vital Signs Blood pressure (!) 147/89, pulse (!) 105, temperature 97.8 F (36.6 C), temperature source Oral, resp. rate 16, height 6\' 1"  (1.854 m), weight 116.8 kg, SpO2 95 %. Constitutional: No distress . Vital signs reviewed. HEENT: NCAT, EOMI, oral membranes moist Neck: supple Cardiovascular: RRR without murmur. No JVD    Respiratory/Chest: CTA Bilaterally without wheezes or rales. Normal effort    GI/Abdomen: BS +, non-tender, non-distended Ext: no clubbing, cyanosis, or edema Psych: pleasant and cooperative   Skin: left knee abrasion resolving Neuro:  awake, confused but able to attend and recognized me. Dysarthric.  Mild left central VII, mild left hemi-facial sensory loss. LUE 2+ deltoid, 3- biceps, tricep,   3 wrist/ HI-motor exam without substantial changes. Marland Kitchen LLE:   1+ to 2 HF, KE- , 0/5 distally-stable. No major changes despite lethargy in motor exam.  Left arm and leg 1/2 light touch. Still No resting tone.  Musculoskeletal: left knee without tenderness or effusion. Right shoulder pain still present   Assessment/Plan: 1. Functional deficits which require 3+ hours per day of interdisciplinary therapy in a comprehensive inpatient rehab  setting. Physiatrist is providing close team supervision and 24 hour management of active medical problems listed below. Physiatrist and rehab team continue to assess barriers to discharge/monitor patient progress toward functional and medical goals  Care Tool:  Bathing    Body parts bathed by patient: Chest, Abdomen, Front perineal area, Face, Left arm, Right upper leg, Left upper leg   Body parts bathed by helper: Right lower leg, Left lower leg, Right arm, Front perineal area, Buttocks     Bathing assist Assist Level: Maximal Assistance - Patient 24 - 49%     Upper Body Dressing/Undressing Upper body dressing   What is the patient wearing?: Pull over shirt    Upper body assist Assist Level: Maximal Assistance - Patient 25 - 49%    Lower Body Dressing/Undressing Lower body dressing      What is the patient wearing?: Pants, Underwear/pull up     Lower body assist Assist for lower body dressing: Maximal Assistance - Patient 25 - 49%     Toileting Toileting    Toileting assist Assist for toileting: 2 Helpers     Transfers Chair/bed transfer  Transfers assist  Chair/bed transfer activity did not occur: Safety/medical concerns  Chair/bed transfer assist level: 2 Helpers     Locomotion Ambulation   Ambulation assist   Ambulation activity did not occur: Safety/medical concerns          Walk 10 feet activity   Assist  Walk 10 feet activity did not occur: Safety/medical concerns        Walk 50 feet activity   Assist Walk 50 feet with 2 turns activity did not occur: Safety/medical concerns         Walk 150 feet activity   Assist Walk 150 feet activity did not occur: Safety/medical concerns         Walk 10 feet on uneven surface  activity   Assist Walk 10 feet on uneven surfaces activity did not occur: Safety/medical concerns         Wheelchair     Assist Is the patient using a wheelchair?: Yes Type of Wheelchair:  Manual Wheelchair activity did not occur: Safety/medical concerns         Wheelchair 50 feet with 2 turns activity    Assist    Wheelchair 50 feet with 2 turns activity did not occur: Safety/medical concerns       Wheelchair 150 feet activity     Assist  Wheelchair 150 feet activity did not occur: Safety/medical concerns       Blood pressure (!) 147/89, pulse (!) 105, temperature 97.8 F (36.6 C), temperature source Oral, resp. rate 16, height 6\' 1"  (1.854 m), weight 116.8 kg, SpO2 95 %.  Medical Problem List and Plan: 1. 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 Impaired mobility: continue Lovenox             -antiplatelet therapy: Aspirin 325 mg daily and Plavix 75 mg daily x3 months then aspirin alone 3. R shoulder pain from fall -continue Neurontin 300 mg 3 times daily, off oxycodone d/t "loopiness" -ROM, strengthening of R shoulder with therapies  -left knee xray reviewed. No injury.   4. Anxiety  Pt needs regular positive reassurance, anxious at times. Wonder if that is some of the problem at night  -may have to back of xanax d/t lethargy             -antipsychotic agents: N/A 5. Neuropsych: This patient is capable of making decisions on his own behalf. 6. Skin/Wound Care: local care as needed 7. Fluids/Electrolytes/Nutrition: encourage PO  .  8.  Dysphagia.  a advanced to regular thins. SLP signed off  -take time, small sips 9.  Hypertension.  Norvasc 10 mg daily Avapro 75 mg daily, Toprol-XL 25 mg daily  12/24- BP within reasonable control 10.  Diabetes mellitus.  Hemoglobin A1c 9.9.  SSI, Tradjenta 5 mg daily, Glucophage 1000 mg twice daily.   CBG (last 3)  Recent Labs    03/19/21 1708  03/19/21 2110 03/20/21 0437  GLUCAP 97 133* 92   12/29  controlled  11.  Hyperlipidemia.  Lipitor 12.  Tobacco use.  Counseling 13.  Seizure prophylaxis.  Trileptal 300 mg 3 times daily 14. Constipation-  -moving bowels-continue senna-s at HS as mtc 15. Hypercarbia/hypoxia, likely OSA  -CXR yesterday simlilar to others -appreciate PCCM consult. They have signed off -trying lower pressure bipap, RT working on mask -OOB, IS, FV -continue oxygen -needs outpt sleep study and pulmonary f/u -resources limited d/t uninsured status             LOS: 21 days A FACE TO FACE EVALUATION WAS PERFORMED  Ranelle Oyster 03/20/2021, 10:02 AM

## 2021-03-21 LAB — GLUCOSE, CAPILLARY
Glucose-Capillary: 99 mg/dL (ref 70–99)
Glucose-Capillary: 110 mg/dL — ABNORMAL HIGH (ref 70–99)
Glucose-Capillary: 90 mg/dL (ref 70–99)
Glucose-Capillary: 108 mg/dL — ABNORMAL HIGH (ref 70–99)

## 2021-03-21 NOTE — Progress Notes (Signed)
PROGRESS NOTE   Subjective/Complaints: Pt attempted to use BIPAP last night. Nursing and RT notes reviewed. Still not tolerating it except for brief periods. Confusion at night not helping, which inherently is related to his CO2 levels  ROS: Patient denies fever, rash, sore throat, blurred vision, nausea, vomiting, diarrhea, cough,   headache, or mood change.    Objective:   CT HEAD WO CONTRAST ( )  Result Date: 03/19/2021 CLINICAL DATA:  Chronic headache EXAM: CT HEAD WITHOUT CONTRAST TECHNIQUE: Contiguous axial images were obtained from the base of the skull through the vertex without intravenous contrast. COMPARISON:  02/16/2021 FINDINGS: Brain: There is no mass, hemorrhage or extra-axial collection. There is generalized atrophy without lobar predilection. Hypodensity of the white matter is most commonly associated with chronic microvascular disease. There is an old infarct of the posterior right basal ganglia. Vascular: Atherosclerotic calcification of the vertebral and internal carotid arteries at the skull base. No abnormal hyperdensity of the major intracranial arteries or dural venous sinuses. Skull: The visualized skull base, calvarium and extracranial soft tissues are normal. Sinuses/Orbits: No fluid levels or advanced mucosal thickening of the visualized paranasal sinuses. No mastoid or middle ear effusion. The orbits are normal. IMPRESSION: 1. No acute intracranial abnormality. 2. Old right basal ganglia infarct and findings of chronic microvascular ischemia. Electronically Signed   By: Deatra Robinson M.D.   On: 03/19/2021 22:09   No results for input(s): WBC, HGB, HCT, PLT in the last 72 hours.   Recent Labs    03/20/21 0551  CREATININE 0.58*       Intake/Output Summary (Last 24 hours) at 03/21/2021 0949 Last data filed at 03/21/2021 5188 Gross per 24 hour  Intake 450 ml  Output 300 ml  Net 150 ml         Physical Exam: Vital Signs Blood pressure 130/68, pulse 96, temperature 98 F (36.7 C), temperature source Oral, resp. rate 14, height 6\' 1"  (1.854 m), weight 117.6 kg, SpO2 95 %. Constitutional: No distress . Vital signs reviewed. HEENT: NCAT, EOMI, oral membranes moist Neck: supple Cardiovascular: RRR without murmur. No JVD    Respiratory/Chest: CTA Bilaterally without wheezes or rales. Normal effort    GI/Abdomen: BS +, non-tender, non-distended Ext: no clubbing, cyanosis, or edema Psych: pleasant and cooperative  Skin: left knee abrasion resolving Neuro:  more alert and aware. Remembers last night. Following commands with therapy. Speech clearer today.   Mild left central VII, mild left hemi-facial sensory loss. LUE 2+ deltoid, 3- biceps, tricep,   3 wrist/ HI-motor exam without substantial changes. Marland Kitchen LLE:   1+ to 2 HF, KE- , 0/5 distally-stable. No major changes despite lethargy in motor exam.  Left arm and leg 1/2 light touch. Still No resting tone.  Musculoskeletal: left knee without tenderness or effusion. Right shoulder pain still present with ROM   Assessment/Plan: 1. Functional deficits which require 3+ hours per day of interdisciplinary therapy in a comprehensive inpatient rehab setting. Physiatrist is providing close team supervision and 24 hour management of active medical problems listed below. Physiatrist and rehab team continue to assess barriers to discharge/monitor patient progress toward functional and medical goals  Care Tool:  Bathing  Body parts bathed by patient: Chest, Abdomen, Front perineal area, Face, Left arm, Right upper leg, Left upper leg   Body parts bathed by helper: Right lower leg, Left lower leg, Right arm, Front perineal area, Buttocks     Bathing assist Assist Level: Maximal Assistance - Patient 24 - 49%     Upper Body Dressing/Undressing Upper body dressing   What is the patient wearing?: Pull over shirt    Upper body assist Assist  Level: Maximal Assistance - Patient 25 - 49%    Lower Body Dressing/Undressing Lower body dressing      What is the patient wearing?: Pants, Underwear/pull up     Lower body assist Assist for lower body dressing: Maximal Assistance - Patient 25 - 49%     Toileting Toileting    Toileting assist Assist for toileting: 2 Helpers     Transfers Chair/bed transfer  Transfers assist  Chair/bed transfer activity did not occur: Safety/medical concerns  Chair/bed transfer assist level: 2 Helpers     Locomotion Ambulation   Ambulation assist   Ambulation activity did not occur: Safety/medical concerns          Walk 10 feet activity   Assist  Walk 10 feet activity did not occur: Safety/medical concerns        Walk 50 feet activity   Assist Walk 50 feet with 2 turns activity did not occur: Safety/medical concerns         Walk 150 feet activity   Assist Walk 150 feet activity did not occur: Safety/medical concerns         Walk 10 feet on uneven surface  activity   Assist Walk 10 feet on uneven surfaces activity did not occur: Safety/medical concerns         Wheelchair     Assist Is the patient using a wheelchair?: Yes Type of Wheelchair: Manual Wheelchair activity did not occur: Safety/medical concerns         Wheelchair 50 feet with 2 turns activity    Assist    Wheelchair 50 feet with 2 turns activity did not occur: Safety/medical concerns       Wheelchair 150 feet activity     Assist  Wheelchair 150 feet activity did not occur: Safety/medical concerns       Blood pressure 130/68, pulse 96, temperature 98 F (36.7 C), temperature source Oral, resp. rate 14, height 6\' 1"  (1.854 m), weight 117.6 kg, SpO2 95 %.  Medical Problem List and Plan: 1. 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 Impaired mobility: continue Lovenox             -antiplatelet therapy: Aspirin 325 mg daily and Plavix 75 mg daily x3 months then aspirin alone 3. R shoulder pain from fall -continue Neurontin 300 mg 3 times daily, off oxycodone d/t "loopiness" -ROM, strengthening of R shoulder with therapies  -left knee xray reviewed. No injury.   4. Anxiety  Pt needs regular positive reassurance, anxious at times. Wonder if that is some of the problem at night  -may have to back of xanax d/t lethargy             -  antipsychotic agents: N/A 5. Neuropsych: This patient is capable of making decisions on his own behalf. 6. Skin/Wound Care: local care as needed 7. Fluids/Electrolytes/Nutrition: encourage PO  .recheck bmet monday 8.  Dysphagia.  a advanced to regular thins. SLP signed off  -take time, small sips 9.  Hypertension.  Norvasc 10 mg daily Avapro 75 mg daily, Toprol-XL 25 mg daily  12/30- BP within reasonable control 10.  Diabetes mellitus.  Hemoglobin A1c 9.9.  SSI, Tradjenta 5 mg daily, Glucophage 1000 mg twice daily.   CBG (last 3)  Recent Labs    03/20/21 1702 03/20/21 2100 03/21/21 0623  GLUCAP 99 105* 99   12/29  controlled  11.  Hyperlipidemia.  Lipitor 12.  Tobacco use.  Counseling 13.  Seizure prophylaxis.  Trileptal 300 mg 3 times daily 14. Constipation-  -moving bowels-continue senna-s at HS as mtc 15. Hypercarbia/hypoxia, likely OSA  -CXR yesterday simlilar to others -appreciate PCCM consult. They have signed off -  lowered pressure bipap, RT working on mask -OOB, IS, FV -continue oxygen -needs outpt sleep study and pulmonary f/u -resources limited d/t uninsured status -entire team continues to provided daily, repeated education re: the importance of using his bipap/effects of sleep apnea             LOS: 22 days A FACE TO  FACE EVALUATION WAS PERFORMED  Ranelle Oyster 03/21/2021, 9:49 AM

## 2021-03-21 NOTE — Progress Notes (Signed)
Occupational Therapy Weekly Progress Note  Patient Details  Name: Brent Young MRN: [id number] Date of Birth: 1960-12-07  Beginning of progress report period: February 28, 2021 End of progress report period: March 21, 2021  Today's Date: 03/21/2021 OT Individual Time: 1240-1340 OT Individual Time Calculation (min): 60 min    Patient has met 2 of 3 short term goals.  Patient has made slow progress towards OT goals. Pt has been limited by increased confusion and lethargy due to pt refusing to wear BiPAP. Pt has demonstrated decreased safety awareness and has even tried to get out of bed unassisted. Pt continues to need +2 assist for slideboard and Stedy transfer due to body habitus, L hemiplegia, pusher syndrome and poor safety awareness. Pt's L UE function is improving however with improved use within functional tasks. We are starting family education to prepare family for care at home.   Patient continues to demonstrate the following deficits: muscle weakness, decreased cardiorespiratoy endurance and decreased oxygen support, impaired timing and sequencing, abnormal tone, unbalanced muscle activation, motor apraxia, ataxia, decreased coordination, and decreased motor planning, decreased midline orientation, decreased attention to left, left side neglect, and decreased motor planning, decreased initiation, decreased attention, decreased awareness, decreased problem solving, decreased safety awareness, decreased memory, and delayed processing, and decreased sitting balance, decreased standing balance, decreased postural control, hemiplegia, and decreased balance strategies and therefore will continue to benefit from skilled OT intervention to enhance overall performance with BADL and Reduce care partner burden.  Patient not progressing toward long term goals.  See goal revision..  Plan of care revisions: Patient goals downgraded to overall mod/max A.  OT Short Term Goals Week 3:  OT Short  Term Goal 1 (Week 3): Pt will be able to roll onto his R side with min A to A caregivers. OT Short Term Goal 1 - Progress (Week 3): Met OT Short Term Goal 2 (Week 3): Patient will recall hemi dressing techniques with min verbal cues OT Short Term Goal 2 - Progress (Week 3): Met OT Short Term Goal 3 (Week 3): Patient will perform toilet transfer with max A of 1 person OT Short Term Goal 3 - Progress (Week 3): Progressing toward goal Week 4:  OT Short Term Goal 1 (Week 4): LTG=STG 2/2 ELOS  Skilled Therapeutic Interventions/Progress Updates:    Pt greeted seated in TIS wc eating lunch with set-up A. L UE was hanging down by wc unsupported and nasal canula was on the floor so pt was on room air. Pt unaware that his oxygen was off. SpO2 83% on room air. Pt placed back on 2L for remainder of session. OT had pt bring L UE up to tray table, then usre L hand as a stabilizer to hold cup while pt able to scoop food bolus with R. OT tried to obtain wide drop arm BSC in preparation for trying slideboard transfer but unable to locate.Pt brought to the sink in wc and worked on use of L hand as a stabilizer of toothbrush with built up handle. Pt able to apply toothpaste and maintain grasp with L hand on handle. Pt brushed teeth with set-up A overall. Pt brought to therapy gym and worked on sit<>stands at [employer] table. Pt needed cues for anterior weight shift to power up with max A of 1 and facilitation at hips to promote full hip/trunk extension. Pt with lateral lean to the L with fatigue, but able to correct with verbal and tactile cues. Incorporated L UE NMR with  weight bearing towel pushes in standing. Pt then returned to room and completed slideboard back to bed to weaker L side with max A of 1 and use of bobath method. Increased time and cues for B LE placement prior to transfer. Pt returned to bed with max A lift LE's and left semi-reclined in bed with needs met, 4 rails up, bed alarm on.  Therapy  Documentation Precautions:  Precautions Precautions: Fall Precaution Comments: BP goal 130-160 per neuro MD; Dense L side deficits Other Brace: AFO for LLE Restrictions Weight Bearing Restrictions: No Pain:  Denies pai\n   Therapy/Group: Individual Therapy  Valma Cava 03/21/2021, 7:55 AM

## 2021-03-21 NOTE — Progress Notes (Signed)
Physical Therapy Session Note  Patient Details  Name: Brent Young MRN: [id number] Date of Birth: 03/29/1960  Today's Date: 03/21/2021 PT Individual Time: 8657-8469 and 1030-1100 PT Individual Time Calculation (min): 45 min and 30 min.  Short Term Goals: Week 3:  PT Short Term Goal 1 (Week 3): Pt will perform bed mobility with modA +1. PT Short Term Goal 2 (Week 3): Pt will perform sit to stand with modA +1. PT Short Term Goal 3 (Week 3): Pt will perform bed to chair transfer with mod A+1. PT Short Term Goal 4 (Week 3): Pt will initiate gait training.  Skilled Therapeutic Interventions/Progress Updates:   First session:  Pt presents supine in bed w/ son present and agreeable to therapy.  Pt rolled to left w/ CGA and use of rail, flexing R knee, but max A for roll to right.  Pt w/ soiled brief and required total A to change w/ assist of 2nd person.  PT applied knee-high TEDS w/ Total A, and then threaded LEs into shorts.  Pt able to attempt pulling shorts up w/ R hand w/ roll to left, but still requires max A to complete.  Pt transferred L sidelying to sit w/ max A and then assist to position feet on floor.  Pt required 2' to attain mid-line sitting at EOB.  PT threaded L arm through sleeve and then w/ facilitation at L elbow and hand for WB, pt able to put R arm through sleeve and pull over head.  Pt required mod A to pull shirt down.  Pt able to lift RLE for donning shoe, facilitation for WB through L arm.  Pt performed multiple sit to stand transfers w/ assist of 2, but lighter.  Pt stood in [place], using B UEs and once midline, pt able to hold position x 1' w/ verbal and occasional facilitation at L quads.  Pt transferred to [facility] w/ Stedy and left at nursing station w/ chair alarm on and reclined.     Second session:  Pt presents sitting in TIS, reclined and agreeable to therapy.  Pt wheeled to dayroom for time conservation.  Pt performed sit to stand transfer x 3 w/ use of mirror for 1  trial.  Pt required assist of 2 for transfer, but pushing w/ R hand on arm rest.  Pt requires manual assist at L knee and once attains standing balance, verbal cues for L knee extension when lists to left.  Pt returned to room and remained sitting in TIS, reclined w/ chair alarm on and all needs in reach.  Handed off to PT.     Therapy Documentation Precautions:  Precautions Precautions: Fall Precaution Comments: BP goal 130-160 per neuro MD; Dense L side deficits Other Brace: AFO for LLE Restrictions Weight Bearing Restrictions: No General:   Vital Signs: Therapy Vitals Temp: 98 F (36.7 C) Temp Source: Oral Pulse Rate: 96 Resp: 14 BP: 130/68 Patient Position (if appropriate): Lying Oxygen Therapy SpO2: 95 % O2 Device: Room Air Pain:   Mobility:   Locomotion :    Trunk/Postural Assessment :    Balance:   Exercises:   Other Treatments:      Therapy/Group: Individual Therapy  Lucio Edward 03/21/2021, 8:49 AM

## 2021-03-21 NOTE — Plan of Care (Signed)
Problem: RH Balance Goal: LTG: Patient will maintain dynamic sitting balance (OT) Description: LTG:  Patient will maintain dynamic sitting balance with assistance during activities of daily living (OT) Flowsheets (Taken 03/21/2021 1349) LTG: Pt will maintain dynamic sitting balance during ADLs with: Contact Guard/Touching assist Note: Goal downgraded 12/30 due to lack of progress-ESD Goal: LTG Patient will maintain dynamic standing with ADLs (OT) Description: LTG:  Patient will maintain dynamic standing balance with assist during activities of daily living (OT)  Flowsheets (Taken 03/21/2021 1349) LTG: Pt will maintain dynamic standing balance during ADLs with: Maximal Assistance - Patient 25 - 49% Note: Goal downgraded 12/30 due to lack of progress-ESD   Problem: Sit to Stand Goal: LTG:  Patient will perform sit to stand in prep for activites of daily living with assistance level (OT) Description: LTG:  Patient will perform sit to stand in prep for activites of daily living with assistance level (OT) Flowsheets (Taken 03/21/2021 1349) LTG: PT will perform sit to stand in prep for activites of daily living with assistance level: Maximal Assistance - Patient 25 - 49% Note: Goal downgraded 12/30 due to lack of progress-ESD   Problem: RH Eating Goal: LTG Patient will perform eating w/assist, cues/equip (OT) Description: LTG: Patient will perform eating with assist, with/without cues using equipment (OT) Flowsheets (Taken 03/21/2021 1349) LTG: Pt will perform eating with assistance level of: Set up assist  Note: Goal downgraded 12/30 due to lack of progress-ESD   Problem: RH Grooming Goal: LTG Patient will perform grooming w/assist,cues/equip (OT) Description: LTG: Patient will perform grooming with assist, with/without cues using equipment (OT) Flowsheets (Taken 03/21/2021 1349) LTG: Pt will perform grooming with assistance level of: Set up assist  Note: Goal downgraded 12/30 due to lack  of progress-ESD   Problem: RH Bathing Goal: LTG Patient will bathe all body parts with assist levels (OT) Description: LTG: Patient will bathe all body parts with assist levels (OT) Flowsheets (Taken 03/21/2021 1349) LTG: Pt will perform bathing with assistance level/cueing: Moderate Assistance - Patient 50 - 74% Note: Goal downgraded 12/30 due to lack of progress-ESD   Problem: RH Dressing Goal: LTG Patient will perform upper body dressing (OT) Description: LTG Patient will perform upper body dressing with assist, with/without cues (OT). Flowsheets (Taken 03/21/2021 1349) LTG: Pt will perform upper body dressing with assistance level of: Minimal Assistance - Patient > 75% Note: Goal downgraded 12/30 due to lack of progress-ESD Goal: LTG Patient will perform lower body dressing w/assist (OT) Description: LTG: Patient will perform lower body dressing with assist, with/without cues in positioning using equipment (OT) Outcome: Not Applicable Flowsheets (Taken 03/21/2021 1349) LTG: Pt will perform lower body dressing with assistance level of: (dc goal 12/30 due to lack of progress -ESD) --   Problem: RH Toileting Goal: LTG Patient will perform toileting task (3/3 steps) with assistance level (OT) Description: LTG: Patient will perform toileting task (3/3 steps) with assistance level (OT)  Outcome: Not Applicable Flowsheets (Taken 03/21/2021 1349) LTG: Pt will perform toileting task (3/3 steps) with assistance level: (dc goal 12/30 due to lack of progress) --   Problem: RH Functional Use of Upper Extremity Goal: LTG Patient will use RT/LT upper extremity as a (OT) Description: LTG: Patient will use right/left upper extremity as a stabilizer/gross assist/diminished/nondominant/dominant level with assist, with/without cues during functional activity (OT) Flowsheets (Taken 03/21/2021 1349) LTG: Use of upper extremity in functional activities: LUE as gross assist level LTG: Pt will use upper  extremity in functional activity with assistance  level of: Supervision/Verbal cueing Note: Goal downgraded 12/30 due to lack of progress-ESD   Problem: RH Toilet Transfers Goal: LTG Patient will perform toilet transfers w/assist (OT) Description: LTG: Patient will perform toilet transfers with assist, with/without cues using equipment (OT) Flowsheets (Taken 03/21/2021 1349) LTG: Pt will perform toilet transfers with assistance level of: Maximal Assistance - Patient 25 - 49% Note: Goal downgraded 12/30 due to lack of progress-ESD   Problem: RH Tub/Shower Transfers Goal: LTG Patient will perform tub/shower transfers w/assist (OT) Description: LTG: Patient will perform tub/shower transfers with assist, with/without cues using equipment (OT) Outcome: Not Applicable Flowsheets (Taken 03/21/2021 1349) LTG: Pt will perform tub/shower stall transfers with assistance level of: (dc goal 12/30 due to lack of progress-ESD) -- Note: Dc goal 12/30 due to lack of progress

## 2021-03-21 NOTE — Progress Notes (Signed)
Physical Therapy Weekly Progress Note  Patient Details  Name: Brent Young MRN: [id number] Date of Birth: [birth year]  Beginning of progress report period: March 13, 2021 End of progress report period: February 19, 2021  Today's Date: 03/21/2021 PT Individual Time: 1101-1200 PT Individual Time Calculation (min): 59 min   Patient has met 1 of 4 short term goals.  Pt is progressing slowly toward mobility goals, with slight improvements with bed mobility and sit to stand transfer, and has also initiated gait training, but still requiring mod to maxA +2 for bed to chair transfers. Pt has L inattention and is easily distracted, and trunk control and performance with therapy deteriorates when pt is not focused on task. Pt was able to ambulate x18', but required +3 assistance and at this time is not close to functional ambulation. PT has initiated family education with pt's son but additional family education will be performed prior to DC.  Patient continues to demonstrate the following deficits muscle weakness, decreased cardiorespiratoy endurance and decreased oxygen support, impaired timing and sequencing, unbalanced muscle activation, decreased coordination, and decreased motor planning, decreased midline orientation and decreased attention to left, and decreased sitting balance, decreased standing balance, decreased postural control, hemiplegia, and decreased balance strategies and therefore will continue to benefit from skilled PT intervention to increase functional independence with mobility.  Patient progressing toward long term goals..  Continue plan of care.  PT Short Term Goals Week 3:  PT Short Term Goal 1 (Week 3): Pt will perform bed mobility with modA +1. PT Short Term Goal 1 - Progress (Week 3): Progressing toward goal PT Short Term Goal 2 (Week 3): Pt will perform sit to stand with modA +1. PT Short Term Goal 2 - Progress (Week 3): Progressing toward goal PT Short Term Goal 3  (Week 3): Pt will perform bed to chair transfer with mod A+1. PT Short Term Goal 3 - Progress (Week 3): Progressing toward goal PT Short Term Goal 4 (Week 3): Pt will initiate gait training. PT Short Term Goal 4 - Progress (Week 3): Met Week 4:  PT Short Term Goal 1 (Week 4): STGs = LTGs  Skilled Therapeutic Interventions/Progress Updates:     Pt received seated in tilt in space WC and agrees to therapy. No complaint of pain. Jason from [facility] present for [facility] evaluation. PT provides information regarding pt's functional status and needs for personal tilt in space WC. Following, pt performs NMR for standing balance and L lower extremity WB with use of Litegait. While seated in WC, pt cued to lean forward and is able to do so without physical assistance. PT places harness while in sitting. Pt then performs sit to stand with modA +2 and bilateral upper extremity support on litegait handles. Pt ambulates x15' with litegait and totalA for management of L lower extremity, with cues for weight shifting and hip extension as pt has difficulty maintaining engagement of glutes in standing, flexing forward at hips and trunk and hanging in harness. Activity adjusted to standing balance due to difficulty with ambulation. Pt weight shifting laterally and progresses to lifting R leg up, with PT providing manual facilitation of R knee extension. Pt also performing forward and backward steps with R leg with same cueing. Mirror provided for visual feedback. Frequent cues to increase hip extension and glute engagement. Pt able to actively extend L knee when cued but cannot maintain activation for more than a few seconds, requiring repeated cues. Seated rest break taken during activity due  to fatigue.  Pt left seated in WC with alarm intact and all needs within reach.  Therapy Documentation Precautions:  Precautions Precautions: Fall Precaution Comments: BP goal 130-160 per neuro MD; Dense L side deficits Other Brace: AFO  for LLE Restrictions Weight Bearing Restrictions: No   Therapy/Group: Individual Therapy  Breck Coons, PT, DPT 03/21/2021, 12:00 PM

## 2021-03-22 LAB — GLUCOSE, CAPILLARY
Glucose-Capillary: 128 mg/dL — ABNORMAL HIGH (ref 70–99)
Glucose-Capillary: 94 mg/dL (ref 70–99)
Glucose-Capillary: 103 mg/dL — ABNORMAL HIGH (ref 70–99)
Glucose-Capillary: 101 mg/dL — ABNORMAL HIGH (ref 70–99)

## 2021-03-22 NOTE — Progress Notes (Signed)
Patient slept off and on last night. Intermittent confusion noted. After respiratory placed patient on bipap-patient tolerated for about 1 hour before removing. Oxygen nasal cannula applied at 2L. Continue to monitor.

## 2021-03-22 NOTE — Progress Notes (Signed)
PROGRESS NOTE   Subjective/Complaints: He asks for lift- discussed with Ed, therapy has been using lift with him. He has no other complaints Slept on and off last night  ROS: Patient denies fever, rash, sore throat, blurred vision, nausea, vomiting, diarrhea, cough,   headache, or mood change. +insomnia   Objective:   No results found. No results for input(s): WBC, HGB, HCT, PLT in the last 72 hours.   Recent Labs    03/20/21 0551  CREATININE 0.58*       Intake/Output Summary (Last 24 hours) at 03/22/2021 1356 Last data filed at 03/22/2021 0700 Gross per 24 hour  Intake 118 ml  Output 500 ml  Net -382 ml        Physical Exam: Vital Signs Blood pressure 131/70, pulse 91, temperature 98.1 F (36.7 C), temperature source Oral, resp. rate 16, height 6\' 1"  (1.854 m), weight 117.6 kg, SpO2 97 %. Gen: no distress, normal appearing HEENT: oral mucosa pink and moist, NCAT Cardio: Reg rate Chest: normal effort, normal rate of breathing Abd: soft, non-distended Ext: no edema Psych: pleasant, normal affect Skin: left knee abrasion resolving Neuro:  more alert and aware. Remembers last night. Following commands with therapy. Speech clearer today.   Mild left central VII, mild left hemi-facial sensory loss. LUE 2+ deltoid, 3- biceps, tricep,   3 wrist/ HI-motor exam without substantial changes. Marland Kitchen LLE:   1+ to 2 HF, KE- , 0/5 distally-stable. No major changes despite lethargy in motor exam.  Left arm and leg 1/2 light touch. Still No resting tone.  Musculoskeletal: left knee without tenderness or effusion. Right shoulder pain still present with ROM   Assessment/Plan: 1. Functional deficits which require 3+ hours per day of interdisciplinary therapy in a comprehensive inpatient rehab setting. Physiatrist is providing close team supervision and 24 hour management of active medical problems listed below. Physiatrist and  rehab team continue to assess barriers to discharge/monitor patient progress toward functional and medical goals  Care Tool:  Bathing    Body parts bathed by patient: Chest, Abdomen, Front perineal area, Face, Left arm, Right upper leg, Left upper leg   Body parts bathed by helper: Right lower leg, Left lower leg, Right arm, Front perineal area, Buttocks     Bathing assist Assist Level: Maximal Assistance - Patient 24 - 49%     Upper Body Dressing/Undressing Upper body dressing   What is the patient wearing?: Pull over shirt    Upper body assist Assist Level: Maximal Assistance - Patient 25 - 49%    Lower Body Dressing/Undressing Lower body dressing      What is the patient wearing?: Pants, Underwear/pull up     Lower body assist Assist for lower body dressing: Maximal Assistance - Patient 25 - 49%     Toileting Toileting    Toileting assist Assist for toileting: 2 Helpers     Transfers Chair/bed transfer  Transfers assist  Chair/bed transfer activity did not occur: Safety/medical concerns  Chair/bed transfer assist level: 2 Helpers     Locomotion Ambulation   Ambulation assist   Ambulation activity did not occur: Safety/medical concerns  Walk 10 feet activity   Assist  Walk 10 feet activity did not occur: Safety/medical concerns        Walk 50 feet activity   Assist Walk 50 feet with 2 turns activity did not occur: Safety/medical concerns         Walk 150 feet activity   Assist Walk 150 feet activity did not occur: Safety/medical concerns         Walk 10 feet on uneven surface  activity   Assist Walk 10 feet on uneven surfaces activity did not occur: Safety/medical concerns         Wheelchair     Assist Is the patient using a wheelchair?: Yes Type of Wheelchair: Manual Wheelchair activity did not occur: Safety/medical concerns         Wheelchair 50 feet with 2 turns activity    Assist     Wheelchair 50 feet with 2 turns activity did not occur: Safety/medical concerns       Wheelchair 150 feet activity     Assist  Wheelchair 150 feet activity did not occur: Safety/medical concerns       Blood pressure 131/70, pulse 91, temperature 98.1 F (36.7 C), temperature source Oral, resp. rate 16, height 6\' 1"  (1.854 m), weight 117.6 kg, SpO2 97 %.  Medical Problem List and Plan: 1. 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 Impaired mobility: continue Lovenox             -antiplatelet therapy: Aspirin 325 mg daily and Plavix 75 mg daily x3 months then aspirin alone 3. R shoulder pain from fall -continue Neurontin 300 mg 3 times daily, off oxycodone d/t "loopiness" -ROM, strengthening of R shoulder with therapies  -left knee xray reviewed. No injury.   4. Anxiety  Pt needs regular positive reassurance, anxious at times. Wonder if that is some of the problem at night  -may have to back of xanax d/t lethargy             -antipsychotic agents: N/A 5. Neuropsych: This patient is capable of making decisions on his own behalf. 6. Skin/Wound Care: local care as needed 7. Fluids/Electrolytes/Nutrition: encourage PO  .recheck bmet monday 8.  Dysphagia.  a advanced to regular thins. SLP signed off  -take time, small sips 9.  Hypertension.  Norvasc 10 mg daily Avapro 75 mg daily, Toprol-XL 25 mg daily  12/31: slightly above goal, continue to monitor 10.  Diabetes mellitus.  Hemoglobin A1c 9.9.  SSI, Tradjenta 5 mg daily, Glucophage 1000 mg twice daily.   CBG (last 3)  Recent Labs    03/21/21 2139 03/22/21 0620 03/22/21 1139  GLUCAP 108* 128* 94   12/31 slightly elevated, continue to monitor.  11.   Hyperlipidemia.  Lipitor 12.  Tobacco use.  Counseling 13.  Seizure prophylaxis.  Trileptal 300 mg 3 times daily 14. Constipation-  -moving bowels-continue senna-s at HS as mtc 15. Hypercarbia/hypoxia, likely OSA  -CXR yesterday simlilar to others -appreciate PCCM consult. They have signed off -  lowered pressure bipap, RT working on mask -OOB, IS, FV -continue oxygen -needs outpt sleep study and  pulmonary f/u -resources limited d/t uninsured status -entire team continues to provided daily, repeated education re: the importance of using his bipap/effects of sleep apnea 12/31: tolerated 1 hour last night             LOS: 23 days A FACE TO FACE EVALUATION WAS PERFORMED  Felicie Kocher P Kenyatta Keidel 03/22/2021, 1:56 PM

## 2021-03-22 NOTE — Progress Notes (Signed)
Patient placed on bipap by respiratory this evening. Patient wore for a few minutes before removing. Education reinforced on importance of wearing the bipap and building up tolerance. Patient wore bipap consistently from 2245 to 2345. Bipap removed per patient request and nasal cannula reapplied. Will attempt to place bipap again later. Continue to monitor.

## 2021-03-23 LAB — GLUCOSE, CAPILLARY
Glucose-Capillary: 102 mg/dL — ABNORMAL HIGH (ref 70–99)
Glucose-Capillary: 100 mg/dL — ABNORMAL HIGH (ref 70–99)
Glucose-Capillary: 105 mg/dL — ABNORMAL HIGH (ref 70–99)
Glucose-Capillary: 118 mg/dL — ABNORMAL HIGH (ref 70–99)

## 2021-03-23 MED ORDER — HYDROXYZINE HCL 10 MG PO TABS
10.0000 mg | ORAL_TABLET | Freq: Three times a day (TID) | ORAL | Status: DC | PRN
  Administered 2021-03-24 – 2021-04-02 (×12): 10 mg via ORAL
  Filled 2021-03-23 (×16): qty 1

## 2021-03-23 NOTE — Progress Notes (Addendum)
Patient refusing BiPAP tonight despite multiple attempts and encouragement. Patient states "I can't do it tonight" and "I want the sleep study first". Education reinforced. Oxygen nasal cannula in at 2L. Continue to monitor.  0500- Increased intermittent coughing noted this shift. Small amount of tan sputum noted. Vitals stable.

## 2021-03-23 NOTE — Progress Notes (Signed)
PROGRESS NOTE   Subjective/Complaints: Has tolerated Bipap for about 1 hour past two nights. Appreciate nursing education in regards to importance. Patient asks for how long he will need to use it He has no other complaints  ROS: Patient denies fever, rash, sore throat, blurred vision, nausea, vomiting, diarrhea, cough,   headache, or mood change. +insomnia   Objective:   No results found. No results for input(s): WBC, HGB, HCT, PLT in the last 72 hours.   No results for input(s): NA, K, CL, CO2, GLUCOSE, BUN, CREATININE, CALCIUM in the last 72 hours.      Intake/Output Summary (Last 24 hours) at 03/23/2021 1009 Last data filed at 03/23/2021 0725 Gross per 24 hour  Intake 597 ml  Output 200 ml  Net 397 ml        Physical Exam: Vital Signs Blood pressure 133/75, pulse 91, temperature 98 F (36.7 C), temperature source Oral, resp. rate 20, height 6\' 1"  (1.854 m), weight 117.6 kg, SpO2 98 %. Gen: no distress, normal appearing HEENT: oral mucosa pink and moist, NCAT Cardio: Reg rate Chest: normal effort, normal rate of breathing Abd: soft, non-distended Ext: no edema Psych: pleasant, normal affect  Skin: left knee abrasion resolving Neuro:  more alert and aware. Remembers last night. Following commands with therapy.Marland KitchenSpeech clearer today.   Mild left central VII, mild left hemi-facial sensory loss. LUE 2+ deltoid, 3- biceps, tricep,   3 wrist/ HI-motor exam without substantial changes. Marland Kitchen LLE:   1+ to 2 HF, KE- , 0/5 distally-stable. No major changes despite lethargy in motor exam.  Left arm and leg 1/2 light touch. Still No resting tone.  Musculoskeletal: left knee without tenderness or effusion. Right shoulder pain still present with ROM   Assessment/Plan: 1. Functional deficits which require 3+ hours per day of interdisciplinary therapy in a comprehensive inpatient rehab setting. Physiatrist is providing close team  supervision and 24 hour management of active medical problems listed below. Physiatrist and rehab team continue to assess barriers to discharge/monitor patient progress toward functional and medical goals  Care Tool:  Bathing    Body parts bathed by patient: Chest, Abdomen, Front perineal area, Face, Left arm, Right upper leg, Left upper leg   Body parts bathed by helper: Right lower leg, Left lower leg, Right arm, Front perineal area, Buttocks     Bathing assist Assist Level: Maximal Assistance - Patient 24 - 49%     Upper Body Dressing/Undressing Upper body dressing   What is the patient wearing?: Pull over shirt    Upper body assist Assist Level: Maximal Assistance - Patient 25 - 49%    Lower Body Dressing/Undressing Lower body dressing      What is the patient wearing?: Pants, Underwear/pull up     Lower body assist Assist for lower body dressing: Maximal Assistance - Patient 25 - 49%     Toileting Toileting    Toileting assist Assist for toileting: 2 Helpers     Transfers Chair/bed transfer  Transfers assist  Chair/bed transfer activity did not occur: Safety/medical concerns  Chair/bed transfer assist level: 2 Helpers     Locomotion Ambulation   Ambulation assist   Ambulation  activity did not occur: Safety/medical concerns          Walk 10 feet activity   Assist  Walk 10 feet activity did not occur: Safety/medical concerns        Walk 50 feet activity   Assist Walk 50 feet with 2 turns activity did not occur: Safety/medical concerns         Walk 150 feet activity   Assist Walk 150 feet activity did not occur: Safety/medical concerns         Walk 10 feet on uneven surface  activity   Assist Walk 10 feet on uneven surfaces activity did not occur: Safety/medical concerns         Wheelchair     Assist Is the patient using a wheelchair?: Yes Type of Wheelchair: Manual Wheelchair activity did not occur:  Safety/medical concerns         Wheelchair 50 feet with 2 turns activity    Assist    Wheelchair 50 feet with 2 turns activity did not occur: Safety/medical concerns       Wheelchair 150 feet activity     Assist  Wheelchair 150 feet activity did not occur: Safety/medical concerns       Blood pressure 133/75, pulse 91, temperature 98 F (36.7 C), temperature source Oral, resp. rate 20, height 6\' 1"  (1.854 m), weight 117.6 kg, SpO2 98 %.  Medical Problem List and Plan: 1. 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 Impaired mobility: continue Lovenox             -antiplatelet therapy: Aspirin 325 mg daily and Plavix 75 mg daily x3 months then aspirin alone 3. R shoulder pain from fall -continue Neurontin 300 mg 3 times daily, off oxycodone d/t "loopiness" -ROM, strengthening of R shoulder with therapies  -left knee xray reviewed. No injury.   4. Anxiety  Pt needs regular positive reassurance, anxious at times. Wonder if that is some of the problem at night  -may have to back of xanax d/t lethargy             -antipsychotic agents: N/A 5. Neuropsych: This patient is capable of making decisions on his own behalf. 6. Skin/Wound Care: local care as needed 7. Fluids/Electrolytes/Nutrition: encourage PO  .recheck bmet monday 8.  Dysphagia.  a advanced to regular thins. SLP signed off  -take time, small sips 9.  Hypertension.  Norvasc 10 mg daily Avapro 75 mg daily, Toprol-XL 25 mg daily  12/31: slightly above goal, continue to monitor 10.  Diabetes mellitus.  Hemoglobin A1c 9.9.  SSI, Tradjenta 5 mg daily, Glucophage 1000 mg twice daily.   CBG (last 3)  Recent Labs    03/22/21 1656 03/22/21 2110  03/23/21 0632  GLUCAP 103* 101* 102*   1/1 slightly elevated, continue to monitor.  11.  Hyperlipidemia.  Lipitor 12.  Tobacco use.  Counseling 13.  Seizure prophylaxis.  Continue Trileptal 300 mg 3 times daily 14. Constipation-  -moving bowels-continue senna-s at HS as mtc 15. Hypercarbia/hypoxia, likely OSA  -CXR yesterday simlilar to others -appreciate PCCM consult. They have signed off -  lowered  pressure bipap, RT working on mask -OOB, IS, FV -continue oxygen -needs outpt sleep study and pulmonary f/u -resources limited d/t uninsured status -entire team continues to provided daily, repeated education re: the importance of using his bipap/effects of sleep apnea 12/31-1/1: tolerated 1 hour last 2 nights, educated regarding importance of bipap             LOS: 24 days A FACE TO FACE EVALUATION WAS PERFORMED  Brent Young P Kariah Loredo 03/23/2021, 10:09 AM

## 2021-03-23 NOTE — Progress Notes (Signed)
Physical Therapy Session Note  Patient Details  Name: Brent Young MRN: [id number] Date of Birth: [birth year]  Today's Date: 03/23/2021 PT Individual Time: 0910-1020 PT Individual Time Calculation (min): 70 min   Short Term Goals: Week 4:  PT Short Term Goal 1 (Week 4): STGs = LTGs  Skilled Therapeutic Interventions/Progress Updates:     Patient in bed upon PT arrival. Patient alert and agreeable to PT session. Patient denied pain during session.  Spent increased time discussing d/c planning. Patient able to teach back that he will use a Hoyer lift and TIS w/c at home per lead PT. States that he will have assist and that family is scheduled for family edu tomorrow. Focused session on sitting balance while performing dynamic balance during ADLs and transfer training via lift for sequencing, technique and safety.   Applied lotion to B feet due to calluses and dry skin. Donned B TED hose with total A bed level. Donned pants with max A bed level during rolling, see below.   Therapeutic Activity: Bed Mobility: Patient performed rolling R with mod A and L with supervision with use of bed rails x2 for donning pants and sling placement for lift transfer. He performed supine to/from sit with min-mod A with use of bed rail. Provided verbal cues for progressing through R side-lying, setting bottom elbow to push up/return to lying, and bringing knees to chest to bring legs on/off the bed.  Transfers: Performed dependent lift transfer using the Maxi-move, educated on differences between Maxi-move and hoyer lift, sling placement, sequencing, and ensuring hip placement in the TIS w/c.  Spent increased time working to [employer] cushion with patient seated in the w/c for improved sitting tolerance.   Neuromuscular Re-ed: Patient performed the following sitting balance activities: -seated EOB >10 min with supervision for static sitting balance and intermittent min A for posterior lean with dynamic  balance while performing upper body bathing, L hand with hand-over-hand assist for bathing R side, and doffing/donning a t-shirt with mod-max A  Patient in TIS w/c tilted at 45 deg for safety at end of session with breaks locked, seat belt alarm set, and all needs within reach. Nursing staff in agreement with transferring patient back to bed via Maxi-move after lunch.   Therapy Documentation Precautions:  Precautions Precautions: Fall Precaution Comments: BP goal 130-160 per neuro MD; Dense L side deficits Other Brace: AFO for LLE Restrictions Weight Bearing Restrictions: No    Therapy/Group: Individual Therapy  Ledger Heindl L Si Jachim PT, DPT  03/23/2021, 12:53 PM

## 2021-03-24 LAB — GLUCOSE, CAPILLARY
Glucose-Capillary: 108 mg/dL — ABNORMAL HIGH (ref 70–99)
Glucose-Capillary: 136 mg/dL — ABNORMAL HIGH (ref 70–99)
Glucose-Capillary: 101 mg/dL — ABNORMAL HIGH (ref 70–99)
Glucose-Capillary: 135 mg/dL — ABNORMAL HIGH (ref 70–99)

## 2021-03-24 MED ORDER — OXYMETAZOLINE HCL 0.05 % NA SOLN
1.0000 | Freq: Two times a day (BID) | NASAL | Status: AC
  Administered 2021-03-24 – 2021-03-26 (×6): 1 via NASAL
  Filled 2021-03-24: qty 30

## 2021-03-24 MED ORDER — ACETAMINOPHEN 650 MG RE SUPP: 650.0000 mg | RECTAL | 0 refills | Status: AC | PRN

## 2021-03-24 MED ORDER — ENOXAPARIN SODIUM 40 MG/0.4ML IJ SOSY
40.0000 mg | PREFILLED_SYRINGE | INTRAMUSCULAR | Status: DC
  Administered 2021-03-25 – 2021-04-03 (×10): 40 mg via SUBCUTANEOUS
  Filled 2021-03-24 (×10): qty 0.4

## 2021-03-24 NOTE — Consult Note (Signed)
Neuropsychological Consultation   Patient:   Brent Young   DOB:   06-26-1960  MR Number:  [id number]  Location:  [facility] [address] CENTER A [address] [id number] [place] [address] Dept: [phone] Loc: [phone]           Date of Service:   03/24/2021  Start Time:   8 AM End Time:   9 AM  Provider/Observer:  Arley Phenix, Psy.D.       Clinical Neuropsychologist       Billing Code/Service: [id number]  Chief Complaint:    Brent Young is a 61 year old male with a past medical history including diabetes, hypertension, hyperlipidemia, history of previous infarcts, history of hippocampal ICH 2021, and tobacco use.  Patient presented on 02/16/2021 with acute onset of left-sided weakness without loss of consciousness.  MRI showed an acute to subacute infarct of the right basal ganglia and corona radiata.  Primary issues with left ICA.  Patient with persistent right shoulder pain and continued left-sided weakness with decreased functional mobility.  Patient was admitted into the comprehensive rehabilitation program.  Reason for Service:  Patient referred for neuropsychological consultation due to coping and adjustment with significant residual left-sided motor deficits greater for left leg than left hand with some improvement motor functions.  Below is the HPI for the current admission.  HPI: [name] is a 61 year old right-handed male with history of diabetes mellitus, hypertension, hyperlipidemia, history of lacunar infarcts, history of hippocampal ICH 2021, tobacco use.  Per chart review patient lives with spouse.  Two-level home bed and bath upstairs 3 steps to entry.  Presented 02/16/2021 with acute onset of left-sided weakness and he did roll out of his bed without loss of consciousness.  Admission chemistries unremarkable potassium 3.1, glucose 181, troponin 37-55, CK574.  Cranial CT scan negative.  CT  angiogram head and neck poor opacification of right vertebral artery throughout its course with increased focal stenosis just distal to its takeoff.  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  Patient did not receive tPA.  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  Cerebral angiogram completed showed left ICA 65 to 70% stenosis, bilateral ICA siphon mild to moderate stenosis right VA stenosis.  Placed on aspirin 325 mg daily and Plavix 75 mg daily for 3 months then aspirin alone due to intracranial stenosis.  Echocardiogram with ejection fraction of 60 to 65% no wall motion abnormalities.  Elevated troponin felt to be secondary to demand ischemia.  Lovenox was added for DVT prophylaxis.  Presently on dysphagia #2 thin liquid diet.  Patient with persistent right shoulder pain x-rays without fracture or dislocation consistent with mild AC joint arthritis with supportive care.  Therapy evaluations completed due to patient's left-sided weakness decreased functional mobility was admitted for a comprehensive rehab program.  Current Status:  Patient was awake and alert as I entered the room lying in bed slightly elevated head.  Patient described difficulties coping with significant residual motor deficits and also reported some issues with memory and attention.  Patient reports that he feels like he had primarily returned to baseline after his 2021 cerebrovascular event.  Accuracy of patient's history/reports could not be independently verified.  Patient was oriented with  adequate cognition.  Behavioral Observation: Brent Young  presents as a 61 y.o.-year-old Right handed African American Male who appeared his stated age. his dress was Appropriate and he was Well Groomed and his manners were  Appropriate to the situation.  his participation was indicative of Appropriate and Redirectable behaviors.  There were physical disabilities noted.  he displayed an appropriate level of cooperation and motivation.     Interactions:    Active Redirectable  Attention:   abnormal and attention span appeared shorter than expected for age  Memory:   abnormal; remote memory intact, recent memory impaired  Visuo-spatial:  not examined  Speech (Volume):  low  Speech:   normal; slurred  Thought Process:  Coherent and Relevant  Though Content:  WNL; not suicidal and not homicidal  Orientation:   person, place, time/date, and situation  Judgment:   Fair  Planning:   Poor  Affect:    Appropriate  Mood:    Dysphoric  Insight:   Fair  Intelligence:   normal  Medical History:   Past Medical History:  Diagnosis Date   Allergy    Arthritis    Diabetes mellitus without complication (HCC)    Hypertension    Stroke [facility])          Patient Active Problem List   Diagnosis Date Noted   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   Shoulder pain, right 02/19/2021   Demand ischemia (HCC) 02/19/2021   Hyperlipidemia, unspecified 02/19/2021   Hypokalemia 02/19/2021   Leukocytosis 02/19/2021   Obesity, Class II, BMI 35-39.9 02/19/2021   Stroke (HCC) 02/17/2021   Essential hypertension 02/17/2021   Diabetes mellitus type 2 in obese (HCC) 02/17/2021   Elevated troponin 02/17/2021   Prolonged QT interval 02/17/2021   Psychiatric History:  No prior psychiatric history noted  Family Med/Psych History:  Family History  Problem Relation Age of Onset   Colon cancer Maternal Grandmother    Esophageal cancer Neg Hx    Stomach cancer Neg Hx    Rectal cancer Neg Hx     Impression/DX:  Brent Young is a 61 year old male with a past medical history including diabetes, hypertension, hyperlipidemia, history of previous infarcts, history of hippocampal ICH 2021, and tobacco use.   Patient presented on 02/16/2021 with acute onset of left-sided weakness without loss of consciousness.  MRI showed an acute to subacute infarct of the right basal ganglia and corona radiata.  Primary issues with left ICA.  Patient with persistent right shoulder pain and continued left-sided weakness with decreased functional mobility.  Patient was admitted into the comprehensive rehabilitation program.  Patient was awake and alert as I entered the room lying in bed slightly elevated head.  Patient described difficulties coping with significant residual motor deficits and also reported some issues with memory and attention.  Patient reports that he feels like he had primarily returned to baseline after his 2021 cerebrovascular event.  Accuracy of patient's history/reports could not be independently verified.  Patient was oriented with adequate cognition.  Disposition/Plan:  Today we worked on coping and adjustment issues with significant residual motor deficits and some ongoing cognitive weaknesses particularly around attention and memory.  The patient reports that he is of improving mood state with improving motor function in his left hand although it is still impaired.  Patient is concerned about how he will function if he does not get better motor function in his legs.  Diagnosis:    Infarction of right basal ganglia (  HCC) - Plan: Ambulatory referral to Neurology, CANCELED: Ambulatory referral to Neurology  Hypoxia - Plan: DG Chest 2 View, DG Chest 2 View  Fall - Plan: DG Knee Complete 4 Views Left, DG Knee Complete 4 Views Left  SOB (shortness of breath) - Plan: DG Chest 2 View, DG Chest 2 View         Electronically Signed   _______________________ Arley PhenixJohn Thessaly Mccullers, Psy.D. Clinical Neuropsychologist

## 2021-03-24 NOTE — Progress Notes (Signed)
Occupational Therapy Session Note  Patient Details  Name: Brent Young MRN: [id number] Date of Birth: July 02, 1960  Today's Date: 03/24/2021 OT Individual Time: 1045-1140 OT Individual Time Calculation (min): 55 min   Week 4:  OT Short Term Goal 1 (Week 4): LTG=STG 2/2 ELOS   Skilled Therapeutic Interventions/Progress Updates:    Pt received sitting in TIS with no c/o pain. Pt's nasal cannula around his head, SpO2 assessed and stagnant in the 80's, fluctuating between as low as 80 to 87%. 2L O2 donned and pt instructed in pursed lip breathing and SpO2 rose to 95%. Pt completed UB bathing at the sink with increased time/focus on inclusion of the LUE. He demonstrated improvement in L attention and functional use, requiring only min cueing/facilitation. He donned a shirt with heavy mod A. Oral care completed with increased encouragement and CGA. He was taken via w/c to the therapy gym. He stood in the stedy and completed throwing activity with the RUE and LUE, alternating to challenge postural control, LUE activation/grasp release, and midline orientation. He required CGA overall. He was taken back to his room and left sitting up in the TIS w/c, tilted back 45 degrees. Chair alarm belt on, all needs within reach.   Therapy Documentation Precautions:  Precautions Precautions: Fall Precaution Comments: BP goal 130-160 per neuro MD; Dense L side deficits Other Brace: AFO for LLE Restrictions Weight Bearing Restrictions: No   Therapy/Group: Individual Therapy  Crissie Reese 03/24/2021, 7:19 AM

## 2021-03-24 NOTE — Progress Notes (Signed)
Occupational Therapy Session Note  Patient Details  Name: Brent Young MRN: [id number] Date of Birth: 06/08/1960  Today's Date: 03/24/2021 OT Individual Time: 2395-3202 OT Individual Time Calculation (min): 58 min    Short Term Goals: Week 4:  OT Short Term Goal 1 (Week 4): LTG=STG 2/2 ELOS  Skilled Therapeutic Interventions/Progress Updates:    Pt greeted seated in TIS wc eating lunch. Pt needed assistance to open containers and cues to integrate L UE as a stabilizer on salad container. Pt then able to manipulate feeding utensils without assistance. OT obtained wide drop arm BSC for future toilet transfer. Pt declined need to go to the bathroom at thie time. Pt then brought to therapy gym and worked on functional use of L UE. Pt able to lift L UE onto high-low table, then utilized therputty. Focus on rolling and flattening with L hand, then peeling putty off of table tyring to isolate finger tips. Progressed to functional pinch with thumb opposition. L UE NMR with cross body punches using B UE's. Pt with intermittent nose bleeds due to nasal canula. OT spent extensive time discussed dc plan and goals of care. OT showed pt manual hoyer lift and discussed need for more family education with son [name]. Pt with very poor awareness of his deficits. Pt stated we needed to teach [name] how to get him off of the floor. Pt stated he thought he would just roll off of the bed or chair onto the carpeted floor and it would be easier for him to get up from the floor. OT provided EXTENSIVE education on why that is NOT a good idea. Educated on injury associated with falls and increased difficulty of completing floor transfer. OT also educated on use of fire department if needed. Pt reported he understood. Will continue to educate! Pt returned to room and left seated in TIS wc tilted back to 50 degrees with bed alarm on, call bell in reach, and needs met.   Therapy Documentation Precautions:   Precautions Precautions: Fall Precaution Comments: BP goal 130-160 per neuro MD; Dense L side deficits Other Brace: AFO for LLE Restrictions Weight Bearing Restrictions: No Pain: Pain Assessment Pain Scale: 0-10 Pain Score: 0-No pain   Therapy/Group: Individual Therapy  Valma Cava 03/24/2021, 12:34 PM

## 2021-03-24 NOTE — Progress Notes (Signed)
Physical Therapy Session Note  Patient Details  Name: Brent Young MRN: [id number] Date of Birth: [birth year]  Today's Date: 03/24/2021 PT Individual Time: 0902-0959 PT Individual Time Calculation (min): 57 min   Short Term Goals: Week 4:  PT Short Term Goal 1 (Week 4): STGs = LTGs  Skilled Therapeutic Interventions/Progress Updates:     Pt received supine in bed and agrees to therapy. No complaint of pain. Supine to sit with maxA and cues for positioning at EOB. Pt performs sit to stand to [facility] with modA +2 and cues for anterior weight shifting, hip extension, and hand placement. TotalA for stedy transfer to mat table. Pt performs NMR for sitting balance and trunk control on mat. Pt initially perform posterior trunk leans with exercise ball positioned behind pt for tactile limit to trunk lean. Mirror positioned for visual feedback. Pt performs sets of x10 posterior to anterior trunk leans with amplitude increasing after each set. Pt eventually requires minA to sit up from partially reclined position, with cues at pelvic and trunk for optimal body mechanics. Pt then performs lateral side leaning with alternating upper extremities x5 reps each. Pt leans onto R elbow and then pushes up to sitting x5, then leans on L elbow and pushes up to midline sitting. PT cues for body mechanics and provides tactile cueing through L arm for increased NM feedback. PT places colored tape as visual target for pt to touch with elbow, and tape is moved farther away from trunk with each set. Pt performs Stedy transfer back to [facility] with modA +2 requires for sit to stand. Left seated with alarm intact and all needs within reach.  Therapy Documentation Precautions:  Precautions Precautions: Fall Precaution Comments: BP goal 130-160 per neuro MD; Dense L side deficits Other Brace: AFO for LLE Restrictions Weight Bearing Restrictions: No    Therapy/Group: Individual Therapy  Breck Coons, PT, DPT 03/24/2021,  3:57 PM

## 2021-03-24 NOTE — Progress Notes (Addendum)
Patient ID: Brent Young, male   DOB: 10-16-1960, 61 y.o.   MRN: [id number]  SW waiting on follow-up from [name]/TOC supervisor to see if able to get approved for oxygen and Bipap or Triology machine with LOG. Pt will need repeat oxygen test on Wednesday.   Pt set up for [facility] medication assistance program.   SW received updates from therapy team that more family education was needed with pt son and his sister, and hoyer lift needed. SW left message for pt son [name] requesting return phone call to discuss family edu again this week, and if SW needs to call pt sister.  SW ordered [employer] lift with [employer] via parachute (charity).  [name], MSW, LCSWA Office: [phone] Cell: [phone] Fax: [phone]

## 2021-03-24 NOTE — Progress Notes (Signed)
Physical Therapy Session Note  Patient Details  Name: Brent Young MRN: [id number] Date of Birth: Feb 11, 1961  Today's Date: 03/24/2021 PT Individual Time: 1400-1415 PT Individual Time Calculation (min): 15 min   Short Term Goals: Week 4:  PT Short Term Goal 1 (Week 4): STGs = LTGs  Skilled Therapeutic Interventions/Progress Updates:    Pt received seated in TIS chair in room, agreeable to PT session. Pt reports feeling very fatigued this PM and is experiencing ongoing nosebleeds, medical team aware. Sit to stand from w/c to stedy with mod A x 2. Pt requires cueing in standing to maintain midline due to L lateral lean. Pt takes seated rest break in perched position on stedy, sit to stand with min A from this position. Pt exhibits onset of L knee buckling in standing with stedy. Pt reports feeling significantly fatigued and requests to return to bed after 3 stands. Sit to supine min A needed for LLE management. Pt left seated in bed with needs in reach, bed alarm in place. Pt on 1.5L O2 via [place] during session. Pt missed 15 min of scheduled therapy session due to fatigue.  Therapy Documentation Precautions:  Precautions Precautions: Fall Precaution Comments: BP goal 130-160 per neuro MD; Dense L side deficits Other Brace: AFO for LLE Restrictions Weight Bearing Restrictions: No General: PT Amount of Missed Time (min): 15 Minutes PT Missed Treatment Reason: Patient fatigue      Therapy/Group: Individual Therapy   Peter Congo, PT, DPT, CSRS  03/24/2021, 2:15 PM

## 2021-03-24 NOTE — Progress Notes (Addendum)
PROGRESS NOTE   Subjective/Complaints: Nosebleed today, has been wearing O2 , denies nasal trauma, no nasal pain  ROS: Patient denies CP, SOB, N/V/D   Objective:   No results found. No results for input(s): WBC, HGB, HCT, PLT in the last 72 hours.   No results for input(s): NA, K, CL, CO2, GLUCOSE, BUN, CREATININE, CALCIUM in the last 72 hours.      Intake/Output Summary (Last 24 hours) at 03/24/2021 1037 Last data filed at 03/24/2021 0756 Gross per 24 hour  Intake 1180 ml  Output 800 ml  Net 380 ml         Physical Exam: Vital Signs Blood pressure 137/77, pulse 95, temperature 98 F (36.7 C), temperature source Oral, resp. rate 16, height 6\' 1"  (1.854 m), weight 117.6 kg, SpO2 93 %.  HEENT- small amt dried blood at L nares  General: No acute distress Mood and affect are appropriate Heart: Regular rate and rhythm no rubs murmurs or extra sounds Lungs: Clear to auscultation, breathing unlabored, no rales or wheezes Abdomen: Positive bowel sounds, soft nontender to palpation, nondistended Extremities: No clubbing, cyanosis, or edema Skin: No evidence of breakdown, no evidence of rash    Skin: left knee abrasion resolving Neuro:  more alert and aware. Remembers last night. Following commands with therapy.Marland KitchenSpeech clearer today.   Mild left central VII, mild left hemi-facial sensory loss. LUE 2+ deltoid, 3- biceps, tricep,   3 wrist/ HI-motor exam without substantial changes. Marland Kitchen LLE:   1+ to 2 HF, KE- , 0/5 distally-stable. No major changes despite lethargy in motor exam.  Left arm and leg 1/2 light touch. Still No resting tone.  Musculoskeletal: left knee without tenderness or effusion. Right shoulder pain still present with ROM   Assessment/Plan: 1. Functional deficits which require 3+ hours per day of interdisciplinary therapy in a comprehensive inpatient rehab setting. Physiatrist is providing close team  supervision and 24 hour management of active medical problems listed below. Physiatrist and rehab team continue to assess barriers to discharge/monitor patient progress toward functional and medical goals  Care Tool:  Bathing    Body parts bathed by patient: Chest, Abdomen, Front perineal area, Face, Left arm, Right upper leg, Left upper leg   Body parts bathed by helper: Right lower leg, Left lower leg, Right arm, Front perineal area, Buttocks     Bathing assist Assist Level: Maximal Assistance - Patient 24 - 49%     Upper Body Dressing/Undressing Upper body dressing   What is the patient wearing?: Pull over shirt    Upper body assist Assist Level: Maximal Assistance - Patient 25 - 49%    Lower Body Dressing/Undressing Lower body dressing      What is the patient wearing?: Pants, Underwear/pull up     Lower body assist Assist for lower body dressing: Maximal Assistance - Patient 25 - 49%     Toileting Toileting    Toileting assist Assist for toileting: 2 Helpers     Transfers Chair/bed transfer  Transfers assist  Chair/bed transfer activity did not occur: Safety/medical concerns  Chair/bed transfer assist level: 2 Helpers     Locomotion Ambulation   Ambulation assist  Ambulation activity did not occur: Safety/medical concerns          Walk 10 feet activity   Assist  Walk 10 feet activity did not occur: Safety/medical concerns        Walk 50 feet activity   Assist Walk 50 feet with 2 turns activity did not occur: Safety/medical concerns         Walk 150 feet activity   Assist Walk 150 feet activity did not occur: Safety/medical concerns         Walk 10 feet on uneven surface  activity   Assist Walk 10 feet on uneven surfaces activity did not occur: Safety/medical concerns         Wheelchair     Assist Is the patient using a wheelchair?: Yes Type of Wheelchair: Manual Wheelchair activity did not occur:  Safety/medical concerns         Wheelchair 50 feet with 2 turns activity    Assist    Wheelchair 50 feet with 2 turns activity did not occur: Safety/medical concerns       Wheelchair 150 feet activity     Assist  Wheelchair 150 feet activity did not occur: Safety/medical concerns       Blood pressure 137/77, pulse 95, temperature 98 F (36.7 C), temperature source Oral, resp. rate 16, height 6\' 1"  (1.854 m), weight 117.6 kg, SpO2 93 %.  Medical Problem List and Plan: 1. 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 [facility] 2021             -patient may  shower             -ELOS/Goals:  03/28/21 - min-mod A  Continue CIR therapies including PT, OT, family ed 2. Impaired mobility: continue Lovenox             -antiplatelet therapy: Aspirin 325 mg daily and Plavix 75 mg daily x3 months then aspirin alone 3. R shoulder pain from fall -continue Neurontin 300 mg 3 times daily, off oxycodone d/t "loopiness" -ROM, strengthening of R shoulder with therapies  -left knee xray reviewed. No injury.   4. Anxiety  Pt needs regular positive reassurance, anxious at times. Wonder if that is some of the problem at night  -may have to back of xanax d/t lethargy             -antipsychotic agents: N/A 5. Neuropsych: This patient is capable of making decisions on his own behalf. 6. Skin/Wound Care: local care as needed 7. Fluids/Electrolytes/Nutrition: encourage PO  .recheck bmet monday 8.  Dysphagia.  a advanced to regular thins. SLP signed off  -take time, small sips 9.  Hypertension.  Norvasc 10 mg daily Avapro 75 mg daily, Toprol-XL 25 mg daily  12/31: slightly above goal, continue to monitor Vitals:   03/23/21 1915 03/24/21 0445  BP: (!) 158/79 137/77  Pulse: 92 95  Resp: 16 16  Temp: 98.2 F (36.8 C) 98 F (36.7 C)  SpO2: 98% 93%  Controlled  03/24/21  10.  Diabetes mellitus.  Hemoglobin A1c 9.9.  SSI, Tradjenta 5 mg daily, Glucophage 1000 mg twice daily.   CBG (last 3)  Recent Labs    03/23/21 1630 03/23/21 2108 03/24/21 0604  GLUCAP 105* 118* 108*   Great control 1/2 11.  Hyperlipidemia.  Lipitor 12.  Tobacco use.  Counseling 13.  Seizure prophylaxis.  Continue Trileptal  300 mg 3 times daily 14. Constipation-  -moving bowels-continue senna-s at HS as mtc 15. Hypercarbia/hypoxia, likely OSA  -resolved  -appreciate PCCM consult. They have signed off -  lowered pressure bipap, RT working on mask -OOB, IS, FV -continue oxygen prn O2 sat <89% -needs outpt sleep study and pulmonary f/u -resources limited d/t uninsured status -entire team continues to provided daily, repeated education re: the importance of using his bipap/effects of sleep apnea 12/31-1/1: tolerated 1 hour last 2 nights, educated regarding importance of bipap     16.  Epistaxis mild likely related to O2 nasal canula, on humidified O2, Sats have looked good would avoid using O2 unless sats <89%        LOS: 25 days A FACE TO FACE EVALUATION WAS PERFORMED  Charlett Blake 03/24/2021, 10:37 AM

## 2021-03-25 ENCOUNTER — Inpatient Hospital Stay (HOSPITAL_COMMUNITY): Payer: Medicaid Other

## 2021-03-25 LAB — GLUCOSE, CAPILLARY
Glucose-Capillary: 95 mg/dL (ref 70–99)
Glucose-Capillary: 111 mg/dL — ABNORMAL HIGH (ref 70–99)
Glucose-Capillary: 111 mg/dL — ABNORMAL HIGH (ref 70–99)
Glucose-Capillary: 102 mg/dL — ABNORMAL HIGH (ref 70–99)

## 2021-03-25 MED ORDER — MOMETASONE FURO-FORMOTEROL FUM 100-5 MCG/ACT IN AERO
2.0000 | INHALATION_SPRAY | Freq: Two times a day (BID) | RESPIRATORY_TRACT | Status: DC
  Administered 2021-03-25 – 2021-04-02 (×17): 2 via RESPIRATORY_TRACT
  Filled 2021-03-25: qty 8.8

## 2021-03-25 NOTE — Progress Notes (Addendum)
Patient ID: Brent Young, male   DOB: 12/16/1960, 60 y.o.   MRN: [id number] ° °SW returned phone call to pt sister Cathy Jefferson ([phone]) to discuss patient care needs at discharge. When discussing pt care needs she indicated that pt would need to explore other options since he lives on the second floor and unable to stay on first floor. SW explained pt inability to navigate stairs and being confined to second floor once returning,and the first floor would be best. She indicated there was no need to discuss a hospital bed because here was no space for one on the first floor as his room is on the second floor. SW reminded her pt was uninsured and SNF is not an option. SW asked her to share with pt that he is unable to return home.  ° °*SW later met with pt to discuss above. He intends to speak with his son to discuss further.  ° °SW sent out SNF referral for LOG.  ° °*SW still waiting on LOG for oxygen/BiPap if pt discharges to home.  ° ° , MSW, LCSWA °Office: [phone] °Cell: [phone] °Fax: [phone]  °

## 2021-03-25 NOTE — Progress Notes (Signed)
Physical Therapy Session Note  Patient Details  Name: Brent Young MRN: [id number] Date of Birth: 12/05/1960  Today's Date: 03/25/2021 PT Missed Time: 31 Minutes Missed Time Reason: Patient unwilling to participate;Patient fatigue  Short Term Goals: Week 1:  PT Short Term Goal 1 (Week 1): Pt will mainain static sitting balance w/multimodal cues and cga > 5 min PT Short Term Goal 1 - Progress (Week 1): Progressing toward goal PT Short Term Goal 2 (Week 1): Pt will tolerate OOB in wc x 3 hrs/day for participation in rehab therapy services w/stable VS PT Short Term Goal 2 - Progress (Week 1): Met PT Short Term Goal 3 (Week 1): Pt will tolerate initiation of training w/sliding board transfers PT Short Term Goal 3 - Progress (Week 1): Met PT Short Term Goal 4 (Week 1): Pt will roll to L side using bedrails w/max assist of 1 PT Short Term Goal 4 - Progress (Week 1): Met Week 2:  PT Short Term Goal 1 (Week 2): Pt will maintain static sitting balance with CGA and cues for >5 minutes. PT Short Term Goal 1 - Progress (Week 2): Met PT Short Term Goal 2 (Week 2): Pt will perform sit to stand transfer with modA +2. PT Short Term Goal 2 - Progress (Week 2): Met PT Short Term Goal 3 (Week 2): Pt will perform sliding board transfer with modA +1. PT Short Term Goal 3 - Progress (Week 2): Progressing toward goal Week 3:  PT Short Term Goal 1 (Week 3): Pt will perform bed mobility with modA +1. PT Short Term Goal 1 - Progress (Week 3): Progressing toward goal PT Short Term Goal 2 (Week 3): Pt will perform sit to stand with modA +1. PT Short Term Goal 2 - Progress (Week 3): Progressing toward goal PT Short Term Goal 3 (Week 3): Pt will perform bed to chair transfer with mod A+1. PT Short Term Goal 3 - Progress (Week 3): Progressing toward goal PT Short Term Goal 4 (Week 3): Pt will initiate gait training. PT Short Term Goal 4 - Progress (Week 3): Met Week 4:  PT Short Term Goal 1 (Week 4): STGs =  LTGs  Skilled Therapeutic Interventions/Progress Updates:    Pt greeted in bed at appointment time. Pt reports he got news that his d/c plan is changing and he needs to make calls/arrangements. Pt politely declined toileting or repositioning at this time. Therapist provided psychosocial support and listening as pt is upset about change in plans. Pt remained in bed at this time. Pt missed 45 min d/t refusal.    Therapy Documentation Precautions:  Precautions Precautions: Fall Precaution Comments: BP goal 130-160 per neuro MD; Dense L side deficits Other Brace: AFO for LLE Restrictions Weight Bearing Restrictions: No General: PT Amount of Missed Time (min): 45 Minutes PT Missed Treatment Reason: Patient unwilling to participate;Patient fatigue    Therapy/Group: Individual Therapy  Mickel Fuchs 03/25/2021, 2:53 PM

## 2021-03-25 NOTE — Progress Notes (Signed)
Patient refused cpap tonight.

## 2021-03-25 NOTE — Progress Notes (Addendum)
Education reinforced on importance of patient wearing Bipap at HS. Patient agreed to wear for 1 hour. Patient placed on bipap at 2155 and requested removal at 2259. Nasal canula at 2L o2 reapplied. No nose bleeds noted this shift. Patient restless throughout the night- not falling asleep until about 0200.  At 0330 patient void incontinent, full bed change. Patient agreeable to bipap again. Bipap worn for approximately . Nasal canula reapplied at 2L.

## 2021-03-25 NOTE — Progress Notes (Signed)
Physical Therapy Session Note  Patient Details  Name: Brent Young MRN: [id number] Date of Birth: April 12, 1960  Today's Date: 03/25/2021 PT Individual Time: 1102-1200 PT Individual Time Calculation (min): 58 min   Short Term Goals: Week 4:  PT Short Term Goal 1 (Week 4): STGs = LTGs  Skilled Therapeutic Interventions/Progress Updates:     Pt received seated in tilt in space WC and agrees to therapy and son present for family education. No complaint of pain. Nasal cannula around pt's forehead due to ongoing issues with nosebleed. WC transport to gym for time management. PT provides education on hoyer lift and demonstrates placement of sling while pt is seated upright in WC. PT performs hoyer lift to mat table and provides cueing to son on sequencing and positioning. Once lowered to table pt placed on wedge to position in semi-fowlers for improved respiration. PT assesses pt's vitals and pt noted to be at 66% O2 on room air with good pleth wave. PT applies 2L O2 and pt vacillates between low 90s and mid 70s for several minutes. PT then titrates to 3L and pt appears to remain in low 90s for remainder of session. MD and RN notified of vitals. PT attempts to demonstrate placing hoyer sling under pt while in supine but pt becomes dyspneic secondary to nosebleed and post-nasal drainage. Pt brought urgently into long sitting and after 1-2 minutes verbalizes improved breathing. PT positions sling in sitting with assistance from son and son performs manual pumping of hoyer lift to return to [facility]. As son is Chartered loss adjuster, pt's buttocks noted to be sinking lower in sling. PT urgently pulls hips back to guide into WC. Pt then cued to perform posterior scoot with cues for body mechanics and initiation and completes with modA +2. Pt tilted back to ~45 degrees. Left seated in WC with alarm intact and all needs within reach.  Therapy Documentation Precautions:  Precautions Precautions: Fall Precaution Comments: BP  goal 130-160 per neuro MD; Dense L side deficits Other Brace: AFO for LLE Restrictions Weight Bearing Restrictions: No   Therapy/Group: Individual Therapy  Beau Fanny, PT, DPT 03/25/2021, 12:55 PM

## 2021-03-25 NOTE — Progress Notes (Signed)
Occupational Therapy Session Note  Patient Details  Name: Brent Young MRN: [id number] Date of Birth: 1960-04-28  Today's Date: 03/25/2021 OT Individual Time: 1345-1413 OT Individual Time Calculation (min): 28 min    Short Term Goals: Week 3:  OT Short Term Goal 1 (Week 3): Pt will be able to roll onto his R side with min A to A caregivers. OT Short Term Goal 1 - Progress (Week 3): Met OT Short Term Goal 2 (Week 3): Patient will recall hemi dressing techniques with min verbal cues OT Short Term Goal 2 - Progress (Week 3): Met OT Short Term Goal 3 (Week 3): Patient will perform toilet transfer with max A of 1 person OT Short Term Goal 3 - Progress (Week 3): Progressing toward goal Week 4:  OT Short Term Goal 1 (Week 4): LTG=STG 2/2 ELOS  Skilled Therapeutic Interventions/Progress Updates:    Pt greeted at time of session bed level on bed pan with son in room, who did not stay for OT session. Checking on pt every 5 minutes to see if finished toileting but eventually unable to have BM. Pt rolling toward R side with Max/total A and toward L side with Mod A 2/2 very limited use of L side. 2nd helper present to assist for all mobility and brief change/LB dressing. Brief change bed level total/dependently and hygiene in same manner. Scoot up in bed Mod A with pt able to help push through RLE and pull with RUE. Positioned with LUE elevated and shoulder supported, SW entering at this time to discuss DC planning with the pt. Alarm on call bell in reach.   Therapy Documentation Precautions:  Precautions Precautions: Fall Precaution Comments: BP goal 130-160 per neuro MD; Dense L side deficits Other Brace: AFO for LLE Restrictions Weight Bearing Restrictions: No     Therapy/Group: Individual Therapy  Viona Gilmore 03/25/2021, 12:58 PM

## 2021-03-25 NOTE — NC FL2 (Signed)
[place] MEDICAID FL2 LEVEL OF CARE SCREENING TOOL     IDENTIFICATION  Patient Name: Brent Young Birthdate: 09-09-1960 Sex: male Admission Date (Current Location): 02/27/2021  [facility] and [place] Number:  Producer, television/film/video and Address:  [facility], [address] [place], [place] [address]      Provider Number: [id number]  Attending Physician Name and Address:  Ranelle Oyster, MD  Relative Name and Phone Number:       Current Level of Care: Hospital Recommended Level of Care: Nursing Facility Prior Approval Number:    Date Approved/Denied:   PASRR Number: [id number]  Discharge Plan: SNF    Current Diagnoses: Patient Active Problem List   Diagnosis Date Noted   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   Shoulder pain, right 02/19/2021   Demand ischemia (HCC) 02/19/2021   Hyperlipidemia, unspecified 02/19/2021   Hypokalemia 02/19/2021   Leukocytosis 02/19/2021   Obesity, Class II, BMI 35-39.9 02/19/2021   Stroke (HCC) 02/17/2021   Essential hypertension 02/17/2021   Diabetes mellitus type 2 in obese (HCC) 02/17/2021   Elevated troponin 02/17/2021   Prolonged QT interval 02/17/2021    Orientation RESPIRATION BLADDER Height & Weight     Self, Time, Situation, Place  O2 (2-4L [place]; Bipap at night) Incontinent Weight: 259 lb 4.2 oz (117.6 kg) Height:  6\' 1"  (185.4 cm)  BEHAVIORAL SYMPTOMS/MOOD NEUROLOGICAL BOWEL NUTRITION STATUS      Incontinent Diet  AMBULATORY STATUS COMMUNICATION OF NEEDS Skin   Extensive Assist Verbally Normal                       Personal Care Assistance Level of Assistance  Bathing, Dressing Bathing Assistance: Limited assistance   Dressing Assistance: Limited assistance     Functional Limitations Info             SPECIAL CARE FACTORS FREQUENCY  PT (By licensed PT), OT (By licensed OT), Speech therapy     PT Frequency: 5xs per week OT Frequency: 5xs per week     Speech  Therapy Frequency: 5xs per week      Contractures Contractures Info: Not present    Additional Factors Info  Code Status Code Status Info: Full             Current Medications (03/25/2021):  This is the current hospital active medication list Current Facility-Administered Medications  Medication Dose Route Frequency Provider Last Rate Last Admin   acetaminophen (TYLENOL) tablet 650 mg  650 mg Oral Q4H PRN 05/23/2021, PA-C   650 mg at 03/23/21 05/21/21   Or   acetaminophen (TYLENOL) 160 MG/5ML solution 650 mg  650 mg Per Tube Q4H PRN Angiulli, 4656, PA-C       Or   acetaminophen (TYLENOL) suppository 650 mg  650 mg Rectal Q4H PRN Angiulli, Mcarthur Rossetti, PA-C       amLODipine (NORVASC) tablet 10 mg  10 mg Oral Daily Mcarthur Rossetti, PA-C   10 mg at 03/25/21 05/23/21   aspirin EC tablet 325 mg  325 mg Oral Daily 8127, PA-C   325 mg at 03/25/21 05/23/21   atorvastatin (LIPITOR) tablet 40 mg  40 mg Oral Daily 5170, PA-C   40 mg at 03/25/21 05/23/21   clopidogrel (PLAVIX) tablet 75 mg  75 mg Oral Daily Reome, Earle J, RPH   75 mg at 03/25/21 05/23/21   diclofenac Sodium (VOLTAREN) 1 %  topical gel 2 g  2 g Topical QID Love, Pamela S, PA-C   2 g at 03/25/21 0824   enoxaparin (LOVENOX) injection 40 mg  40 mg Subcutaneous Q24H Love, Pamela S, PA-C       gabapentin (NEURONTIN) capsule 300 mg  300 mg Oral TID Charlton Amor, PA-C   300 mg at 03/25/21 1441   hydrOXYzine (ATARAX) tablet 10 mg  10 mg Oral TID PRN Horton Chin, MD   10 mg at 03/24/21 2300   insulin aspart (novoLOG) injection 0-9 Units  0-9 Units Subcutaneous TID WC AngiulliMcarthur Rossetti, PA-C   1 Units at 03/24/21 1222   irbesartan (AVAPRO) tablet 75 mg  75 mg Oral Daily Charlton Amor, PA-C   75 mg at 03/25/21 7616   linagliptin (TRADJENTA) tablet 5 mg  5 mg Oral Daily Charlton Amor, PA-C   5 mg at 03/25/21 0737   MEDLINE mouth rinse  15 mL Mouth Rinse BID Faith Rogue T, MD   15 mL at 03/25/21  0824   metFORMIN (GLUCOPHAGE) tablet 1,000 mg  1,000 mg Oral BID WC AngiulliMcarthur Rossetti, PA-C   1,000 mg at 03/25/21 1062   metoprolol succinate (TOPROL-XL) 24 hr tablet 25 mg  25 mg Oral Daily Charlton Amor, PA-C   25 mg at 03/25/21 6948   mometasone-formoterol (DULERA) 100-5 MCG/ACT inhaler 2 puff  2 puff Inhalation BID Love, Pamela S, PA-C       Oxcarbazepine (TRILEPTAL) tablet 300 mg  300 mg Oral TID Charlton Amor, PA-C   300 mg at 03/25/21 1441   oxymetazoline (AFRIN) 0.05 % nasal spray 1 spray  1 spray Each Nare BID Jacquelynn Cree, PA-C   1 spray at 03/25/21 0824   pantoprazole (PROTONIX) EC tablet 40 mg  40 mg Oral Daily Ranelle Oyster, MD   40 mg at 03/25/21 5462   senna-docusate (Senokot-S) tablet 2 tablet  2 tablet Oral QHS Ranelle Oyster, MD   2 tablet at 03/24/21 2032   sorbitol 70 % solution 30 mL  30 mL Oral Daily PRN Ranelle Oyster, MD   30 mL at 03/11/21 7035     Discharge Medications: Please see discharge summary for a list of discharge medications.  Relevant Imaging Results:  Relevant Lab Results:   Additional Information [id number]  Gretchen Short, LCSW

## 2021-03-25 NOTE — Patient Care Conference (Signed)
Inpatient RehabilitationTeam Conference and Plan of Care Update Date: 03/25/2021   Time: 10:40 AM    Patient Name: Brent Young      Medical Record Number: [id number]  Date of Birth: November 17, 1960 Sex: Male         Room/Bed: 4W11C/4W11C-01 Payor Info: Payor: MEDICAID [place] / Plan: MEDICAID [place]-FAMILY PLANNING / Product Type: *No Product type* /    Admit Date/Time:  02/27/2021  3:04 PM  Primary Diagnosis:  Infarction of right basal ganglia [facility])  Hospital Problems: Principal Problem:   Infarction of right basal ganglia [facility])    Expected Discharge Date: Expected Discharge Date: 03/28/21  Team Members Present: Physician leading conference: Dr. Genice Rouge Social Worker Present: [name], LCSWA Nurse Present: Kennyth Arnold, RN PT Present: Malachi Pro, PT OT Present: Kearney Hard, OT PPS Coordinator present : Fae Pippin, SLP     Current Status/Progress Goal Weekly Team Focus  Bowel/Bladder   mostly cont. of bladder, incont. of bowels. Last BM 03/23/21  remain cont. of b/b  Assess q shift and PRN   Swallow/Nutrition/ Hydration             ADL's   Max A+2 transfers, bed level LB ADL, Mod/max A rolling,  Mod/max A  L UE NMR, dc planning, pt.family education, sitting balance, self-care retraining   Mobility   mod to maxA bed mobility and trasnfers, modA +2 sit to stand, gait x18' with +3 assist  minA, probably WC level  family ed, hoyer training, balance, transfers, bed mobility   Communication             Safety/Cognition/ Behavioral Observations            Pain   No c/o pain, voltaren gel applied to rt. shoulder  <3/10 pain level  Assess q shift and PRN   Skin   Abrasion to lower extremities, scabbing  remain freee of skin impairments/infection  Assess qshift and PRN     Discharge Planning:  Pt is uninsured. Pt screened by financial counselors. MCD pending DDS eligibility. D/c to home with his son Brent Young being primary caregiver. fam edu completed. [facility]  referral sent to [facility] and waiting on updates. Charity DME: hospital bed and [facility] with [employer]. Waiting on approval from Zack/TOC Supervisor about LOG for home oxygen and Bipap or Triology machine.   Team Discussion: Oxygen on head this morning. Still having nose bleeds. Oxygen off during the day. CBG's are good. Continent B/B, reports 8/10 pain to right shoulder. Continues to refuse Bipap. Son is overwhelmed. Have left message for son to return call. Can't place SNF due to no insurance coverage. Son missed entire OT session. Needs Hoyer Engineer, technical sales. Patient has poor awareness. Is SNF level of care functionally. Max assist with bed mobility, mod/max +2 transfers with steady and squat pivot. [employer] lift needed. Patient on target to meet rehab goals: Mod/max assist ADL's, min assist WC level.  *See Care Plan and progress notes for long and short-term goals.   Revisions to Treatment Plan:  Continue to re-enforce education for Bi-pap. Teaching Needs: Family education, medication/pain management, safety awareness, transfer training, etc.  Current Barriers to Discharge: Decreased caregiver support, Home enviroment access/layout, Lack of/limited family support, Insurance for SNF coverage, Weight, Medication compliance, and pain.  Possible Resolutions to Barriers: Family education [employer] lift Follow-up PT/OT     Medical Summary Current Status: pt using BiPAP slightly here; LBM 1/1- continent; still some nose bleeds from O2; denied pain  to doctor, but 8/10 R shoulder pain  Barriers to Discharge: Decreased family/caregiver support;Home enviroment access/layout;Weight;Weight bearing restrictions;Medical stability;New oxygen;Medication compliance  Barriers to Discharge Comments: son missed all therapy this AM; needs family education; son very overwhelmed. Cannot get into SNF due to lack of insurance- has ot go home Possible Resolutions to [employer] Focus: needs BiPAP-   trying to get BiPAP/O2 at d/c. looking into chairty H/H- d/c date 1/6-  needs a lot of physcial care limiters are dispo issues and significant functional deficits. Needs hoyer lift training.   Continued Need for Acute Rehabilitation Level of Care: The patient requires daily medical management by a physician with specialized training in physical medicine and rehabilitation for the following reasons: Direction of a multidisciplinary physical rehabilitation program to maximize functional independence : Yes Medical management of patient stability for increased activity during participation in an intensive rehabilitation regime.: Yes Analysis of laboratory values and/or radiology reports with any subsequent need for medication adjustment and/or medical intervention. : Yes   I attest that I was present, lead the team conference, and concur with the assessment and plan of the team.   Tennis Must 03/25/2021, 2:56 PM

## 2021-03-25 NOTE — Progress Notes (Signed)
PROGRESS NOTE   Subjective/Complaints:  Pt reports no more nodebleed.  O2 was on head when I walked in-  LBM yesterday- denies pain.  O2 at 4L- put back on.   ROS:   Pt denies SOB, abd pain, CP, N/V/C/D, and vision changes   Objective:   No results found. No results for input(s): WBC, HGB, HCT, PLT in the last 72 hours.   No results for input(s): NA, K, CL, CO2, GLUCOSE, BUN, CREATININE, CALCIUM in the last 72 hours.      Intake/Output Summary (Last 24 hours) at 03/25/2021 0922 Last data filed at 03/25/2021 0733 Gross per 24 hour  Intake 700 ml  Output 525 ml  Net 175 ml        Physical Exam: Vital Signs Blood pressure 137/69, pulse 94, temperature 98.1 F (36.7 C), resp. rate 16, height 6\' 1"  (1.854 m), weight 117.6 kg, SpO2 94 %.   General: awake, alert, appropriate, sitting up in bed; son at bedside; O2 on head; replaced in nose; NAD HENT: conjugate gaze; oropharynx dry- O2 by [place] 4L CV: regular rate; no JVD Pulmonary: decreased at bases B/L- otherwise sounds OK GI: soft, NT, ND, (+)BS; protuberant Psychiatric: appropriate Neurological: alert Skin: left knee abrasion resolving Neuro:  more alert and aware. Remembers last night. Following commands with therapy. Speech clearer today.   Mild left central VII, mild left hemi-facial sensory loss. LUE 2+ deltoid, 3- biceps, tricep,   3 wrist/ HI-motor exam without substantial changes. Marland Kitchen LLE:   1+ to 2 HF, KE- , 0/5 distally-stable. No major changes despite lethargy in motor exam.  Left arm and leg 1/2 light touch. Still No resting tone.  Musculoskeletal: left knee without tenderness or effusion. Right shoulder pain still present with ROM   Assessment/Plan: 1. Functional deficits which require 3+ hours per day of interdisciplinary therapy in a comprehensive inpatient rehab setting. Physiatrist is providing close team supervision and 24 hour management of active  medical problems listed below. Physiatrist and rehab team continue to assess barriers to discharge/monitor patient progress toward functional and medical goals  Care Tool:  Bathing    Body parts bathed by patient: Chest, Abdomen, Front perineal area, Face, Left arm, Right upper leg, Left upper leg   Body parts bathed by helper: Right lower leg, Left lower leg, Right arm, Front perineal area, Buttocks     Bathing assist Assist Level: Maximal Assistance - Patient 24 - 49%     Upper Body Dressing/Undressing Upper body dressing   What is the patient wearing?: Pull over shirt    Upper body assist Assist Level: Maximal Assistance - Patient 25 - 49%    Lower Body Dressing/Undressing Lower body dressing      What is the patient wearing?: Pants, Underwear/pull up     Lower body assist Assist for lower body dressing: Maximal Assistance - Patient 25 - 49%     Toileting Toileting    Toileting assist Assist for toileting: 2 Helpers     Transfers Chair/bed transfer  Transfers assist  Chair/bed transfer activity did not occur: Safety/medical concerns  Chair/bed transfer assist level: 2 Marland Kitchen  assist   Ambulation activity did not occur: Safety/medical concerns          Walk 10 feet activity   Assist  Walk 10 feet activity did not occur: Safety/medical concerns        Walk 50 feet activity   Assist Walk 50 feet with 2 turns activity did not occur: Safety/medical concerns         Walk 150 feet activity   Assist Walk 150 feet activity did not occur: Safety/medical concerns         Walk 10 feet on uneven surface  activity   Assist Walk 10 feet on uneven surfaces activity did not occur: Safety/medical concerns         Wheelchair     Assist Is the patient using a wheelchair?: Yes Type of Wheelchair: Manual Wheelchair activity did not occur: Safety/medical concerns         Wheelchair 50 feet  with 2 turns activity    Assist    Wheelchair 50 feet with 2 turns activity did not occur: Safety/medical concerns       Wheelchair 150 feet activity     Assist  Wheelchair 150 feet activity did not occur: Safety/medical concerns       Blood pressure 137/69, pulse 94, temperature 98.1 F (36.7 C), resp. rate 16, height 6\' 1"  (1.854 m), weight 117.6 kg, SpO2 94 %.  Medical Problem List and Plan: 1. 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 Impaired mobility: continue Lovenox             -antiplatelet therapy: Aspirin 325 mg daily and Plavix 75 mg daily x3 months then aspirin alone 3. R shoulder pain from fall -continue Neurontin 300 mg 3 times daily, off oxycodone d/t "loopiness" -ROM, strengthening of R shoulder with therapies  -left knee xray reviewed. No injury.   4. Anxiety  Pt needs regular positive reassurance, anxious at times. Wonder if that is some of the problem at night  -may have to back off of xanax d/t lethargy             -antipsychotic agents: N/A 5. Neuropsych: This patient is capable of making decisions on his own behalf. 6. Skin/Wound Care: local care as needed 7. Fluids/Electrolytes/Nutrition: encourage PO  .recheck bmet monday 8.  Dysphagia.  a advanced to regular thins. SLP signed off  -take time, small sips 9.  Hypertension.  Norvasc 10 mg daily Avapro 75 mg daily, Toprol-XL 25 mg daily  12/31: slightly above goal, continue to monitor Vitals:   03/24/21 2006 03/25/21 0418  BP: 122/86 137/69  Pulse: 96 94  Resp: 16 16  Temp: 98.5 F (36.9 C) 98.1 F (36.7 C)  SpO2: 92% 94%   1/3- BP controlled- con't regimen  10.  Diabetes mellitus.   Hemoglobin A1c 9.9.  SSI, Tradjenta 5 mg daily, Glucophage 1000 mg twice daily.   CBG (last 3)  Recent Labs    03/24/21 1643 03/24/21 2144 03/25/21 0629  GLUCAP 101* 135* 95  1/3- Good BG control- con't regimen 11.  Hyperlipidemia.  Lipitor  12.  Tobacco use.  Counseling 13.  Seizure prophylaxis.  Continue Trileptal 300 mg 3 times daily 14. Constipation-  -moving bowels-continue senna-s at HS as mtc 15. Hypercarbia/hypoxia, likely OSA  -resolved  -appreciate PCCM consult. They have signed off -  lowered pressure bipap, RT working on mask -OOB, IS, FV -continue oxygen prn O2 sat <89% -needs outpt sleep study and pulmonary f/u -resources limited d/t uninsured status -entire team continues to provided daily, repeated education re: the importance of using his bipap/effects of sleep apnea 12/31-1/1: tolerated 1 hour last 2 nights, educated regarding importance of bipap   1/2- Pt tolerated 1 hour of BiPAP last night-   16.  Epistaxis mild likely related to O2 nasal canula, on humidified O2, Sats have looked good would avoid using O2 unless sats <89%        LOS: 26 days A FACE TO FACE EVALUATION WAS PERFORMED  Kaelan Amble 03/25/2021, 9:22 AM

## 2021-03-25 NOTE — Progress Notes (Signed)
Occupational Therapy Session Note  Patient Details  Name: Brent Young MRN: [id number] Date of Birth: 12-23-1960  Today's Date: 03/25/2021 OT Individual Time: 0920-1030 OT Individual Time Calculation (min): 70 min   Short Term Goals: Week 4:  OT Short Term Goal 1 (Week 4): LTG=STG 2/2 ELOS  Skilled Therapeutic Interventions/Progress Updates:    Pt greeted semi-reclined in bed, Pt with nose bleed throughout session with clotting that increased with any change in position. OT made copies of hoyer lift education sheet and had planned to do [place] lift training and bed level BADL training with family, but no family present. Pt needed max/total A for bed level LB ADLs. Max A to come to sitting EOB. UB bathing/dressing completed with pt needing L hand to stabilize in unsupported sitting and min A still to maintain balance. UB dressing completed with mod A. Slideboard transfer from bed to new TIS wc with max A +2 using bobath method. Pt brought to the gym and OT adjusted TIS wc leg rests. Pt continued to have nose bleeds throughout session and needed cues to replace O2 after cleaning nose.   Therapy Documentation Precautions:  Precautions Precautions: Fall Precaution Comments: BP goal 130-160 per neuro MD; Dense L side deficits Other Brace: AFO for LLE Restrictions Weight Bearing Restrictions: No Pain: Pain Assessment Pain Scale: 0-10 Pain Score: 0-No pain  Therapy/Group: Individual Therapy  Mal Amabile 03/25/2021, 10:56 AM

## 2021-03-26 LAB — CBC WITH DIFFERENTIAL/PLATELET
Abs Immature Granulocytes: 0.03 10*3/uL (ref 0.00–0.07)
Basophils Absolute: 0.1 10*3/uL (ref 0.0–0.1)
Basophils Relative: 1 %
Eosinophils Absolute: 0.2 10*3/uL (ref 0.0–0.5)
Eosinophils Relative: 3 %
HCT: 29.6 % — ABNORMAL LOW (ref 39.0–52.0)
Hemoglobin: 9.8 g/dL — ABNORMAL LOW (ref 13.0–17.0)
Immature Granulocytes: 0 %
Lymphocytes Relative: 24 %
Lymphs Abs: 2 10*3/uL (ref 0.7–4.0)
MCH: 31.1 pg (ref 26.0–34.0)
MCHC: 33.1 g/dL (ref 30.0–36.0)
MCV: 94 fL (ref 80.0–100.0)
Monocytes Absolute: 0.9 10*3/uL (ref 0.1–1.0)
Monocytes Relative: 11 %
Neutro Abs: 5 10*3/uL (ref 1.7–7.7)
Neutrophils Relative %: 61 %
Platelets: 255 10*3/uL (ref 150–400)
RBC: 3.15 MIL/uL — ABNORMAL LOW (ref 4.22–5.81)
RDW: 16.4 % — ABNORMAL HIGH (ref 11.5–15.5)
WBC: 8.2 10*3/uL (ref 4.0–10.5)
nRBC: 0 % (ref 0.0–0.2)

## 2021-03-26 LAB — GLUCOSE, CAPILLARY
Glucose-Capillary: 112 mg/dL — ABNORMAL HIGH (ref 70–99)
Glucose-Capillary: 123 mg/dL — ABNORMAL HIGH (ref 70–99)
Glucose-Capillary: 101 mg/dL — ABNORMAL HIGH (ref 70–99)
Glucose-Capillary: 95 mg/dL (ref 70–99)

## 2021-03-26 LAB — COMPREHENSIVE METABOLIC PANEL
ALT: 19 U/L (ref 0–44)
AST: 21 U/L (ref 15–41)
Albumin: 3.9 g/dL (ref 3.5–5.0)
Alkaline Phosphatase: 113 U/L (ref 38–126)
Anion gap: 8 (ref 5–15)
BUN: 13 mg/dL (ref 8–23)
CO2: 33 mmol/L — ABNORMAL HIGH (ref 22–32)
Calcium: 9.1 mg/dL (ref 8.9–10.3)
Chloride: 99 mmol/L (ref 98–111)
Creatinine, Ser: 0.66 mg/dL (ref 0.61–1.24)
GFR, Estimated: >60 mL/min (ref >60)
Glucose, Bld: 131 mg/dL — ABNORMAL HIGH (ref 70–99)
Potassium: 3.9 mmol/L (ref 3.5–5.1)
Sodium: 140 mmol/L (ref 135–145)
Total Bilirubin: 0.8 mg/dL (ref 0.3–1.2)
Total Protein: 7.2 g/dL (ref 6.5–8.1)

## 2021-03-26 NOTE — Progress Notes (Signed)
Physical Therapy Session Note  Patient Details  Name: Brent Young MRN: [id number] Date of Birth: Jul 23, 1960  Today's Date: 03/26/2021 PT Individual Time: 0940-1030 PT Individual Time Calculation (min): 50 min   Short Term Goals: Week 4:  PT Short Term Goal 1 (Week 4): STGs = LTGs  Skilled Therapeutic Interventions/Progress Updates: Pt presented in TIS agreeable to therapy. Pt denies pain throughout most of session. Pt noted not to have nosebleed during this session with pt maintained on 2L supplemental O2. Pt does c/o of soreness/pressure at LLE, noted to be leg rest pushing into pt's leg. During session PTA places washcloth with co-band at pressure point with pt stating significant relief. PTA obtained and threaded pants total A, with pt performing Sit to stand in [place] with maxA x 2 and required total A for LB clothing management. At timing of leaving room, [name], LSW arrived to provide update to pt. Missed 10 min of skilled PT due to updates. PTA then transported pt to ortho gym and participated in [place] program incorporating reaching from TIS position and lowering L arm rest. Pt able to perform reaching tasks and with cues return to midline. Pt also participated in tracing activity with LUE with PTA providing HOH assist and providing verbal cues for hand extension. Pt returned to room at end of session and remained in TIS with belt alarm on, call bell within reach and LUE supported on table tray with pillow in places.       Therapy Documentation Precautions:  Precautions Precautions: Fall Precaution Comments: BP goal 130-160 per neuro MD; left side weakness, more impairment noted in the LE vs UE Other Brace: AFO for LLE Restrictions Weight Bearing Restrictions: No General: PT Amount of Missed Time (min): 10 Minutes PT Missed Treatment Reason: Other (Comment) (msg with LSW) Vital Signs: Therapy Vitals Temp: 98.3 F (36.8 C) Pulse Rate: 92 Resp: 15 BP: (!) 161/77 Patient  Position (if appropriate): Sitting Oxygen Therapy SpO2: 96 % O2 Device: Nasal Cannula Pain: Pain Assessment Pain Scale: Faces Pain Score: 0-No pain Mobility:   Locomotion :    Trunk/Postural Assessment :    Balance:   Exercises:   Other Treatments:      Therapy/Group: Individual Therapy  [name] 03/26/2021, 1:17 PM

## 2021-03-26 NOTE — Progress Notes (Addendum)
Occupational Therapy Session Note  Patient Details  Name: Brent Young MRN: [id number] Date of Birth: April 27, 1960  Today's Date: 03/26/2021 OT Individual Time: 4481-8563 OT Individual Time Calculation (min): 30 min    Short Term Goals: Week 4:  OT Short Term Goal 1 (Week 4): LTG=STG 2/2 ELOS  Skilled Therapeutic Interventions/Progress Updates:    Session 1: (1497-0263)  Pt sitting in tilt in space wheelchair with his son present for session.  No report of pain during session.  Discussed current levels with OT regarding selfcare task completion at bed level.  Verbally educated pt's son on assist with rolling side to side for LB selfcare and toileting tasks.  He reports seeing nursing completing it before and feeling comfortable assisting, but since pt was up in the tilt in space wheelchair, and session time limited, did not attempt this session.  Can have him try and return demonstrate in the pm if he is here since pt was positioned back in the bed at the end of the session.  Utilized St[place] for sit to stand from the tilt in space wheelchair.  Max assist for sit to stand in the [place] with pt demonstrating ability to position LUE on the bar for pulling up and maintaining with min assist.  Increased left knee flexion noted in standing with mod demonstrational cueing and mod assist to try and keep it extended.  Next, he completed transfer back to the bed per request with total assist using the [facility].  Max assist for transfer from sit to supine once on the EOB.  Therapist provided total assist for removal of shoes with the LUE positioned on pillows and PRAFO in place. Call button and phone in reach and safety alarm in place.    Session 2: (1435-1500 individual tx with 1500-1515 co-tx)  Pt in tilt in space wheelchair to start session.  No report of pain noted during session.  He needed max assist for doffing his PRAFO and donning his shoes.  Took pt down to the therapy gym once nursing had finished giving  meds.  Noted in the middle of a sliding board transfer, pt with urine soaked brief and paper scrubs.  Returned to the room to work on cleaning up and Designer, television/film set.  He needed total +2 (pt 30%) for sit to stand and standing balance while therapists provide total assist for removal of soiled clothing sit to stand.  He was able to assist some with bathing in standing using the RUE while stabilizing with the LUE on the sink.  Still however, he needed overall total +2 (pt 30% secondary to standing balance.  Pt with decreased ability to maintain upright hip and trunk extension in standing.  Max facilitation needed to maintain upright trunk as he would fall into flexion once he would release his RUE from the surface.  Oxygen sats 90% or greater on 2Ls nasal cannula after standing.  Max assist was needed for threading the brief over his LEs with total assist for donning slip on scrub pants as well.  Total +2 (pt 30%) for sit to stand and standing to pull them up over his hips.  Finished session with completion of another standing interval at the sink with BUE supported.  Had pt work on softening up the right knee so he could flex it slightly and then had him complete small half steps in all directions in order to decrease hyperextension in the right knee as well as increasing weightbearing over the LLE.  Total +2 (pt 30%) needed for completion of this.  Pt was left with PT to complete second part of session.    Therapy Documentation Precautions:  Precautions Precautions: Fall Precaution Comments: BP goal 130-160 per neuro MD; left side weakness, more impairment noted in the LE vs UE Other Brace: AFO for LLE Restrictions Weight Bearing Restrictions: No  Pain: Pain Assessment Pain Scale: Faces Pain Score: 0-No pain ADL: See Care Tool Section for some details of mobility and selfcare  Therapy/Group: Individual Therapy  Geneva Barrero OTR/L 03/26/2021, 12:15 PM

## 2021-03-26 NOTE — Progress Notes (Signed)
Physical Therapy Session Note  Patient Details  Name: Brent Young MRN: [id number] Date of Birth: [birth year]  Today's Date: 03/26/2021 PT Individual Time: 1530-1600 PT Individual Time Calculation (min): 30 min  and Today's Date: 03/26/2021 PT Co-Treatment Time: 1515-1530 PT Co-Treatment Time Calculation (min): 15 min  Short Term Goals: Week 4:  PT Short Term Goal 1 (Week 4): STGs = LTGs  Skilled Therapeutic Interventions/Progress Updates:    Co tx: Patient received up at sink with OT present, agreeable to Co-tx. He denies pain, but endorses fatigue. 2L O2 maintained throughout session. Patient able to complete multiple repetitions of sit <> stand at sink with MaxA x2 + multimodal cues for upright posture. PT facilitating weight shift R and soft R knee flexion to prevent excessive pushing to the L. Patient requiring +2 assist to maintain dynamic balance to wash periarea. Verbal cues needed to motor plan scooting forward in chair safely and appropriately. After multiple bouts of sit <> stand, patient reporting SOB, O2 90% . PT coaching patient through pursed lip breathing. PT continuing with individual session.  Individual session: PT transporting patient in wc to therapy gym for time management and energy conservation. AROM L LE with tapping facilitation to L quads. Patient recruiting R LE for increased ROM of L. Patient also with posterior trunk thrust in chair to compensate for weak L hip flexors/ quads. PT dependently repositioning patient in chair. Kinetron L LE 40cm/s x8. Patient then starting to close his eyes and fall asleep. O2 98%, HR 86BPM. Patient requesting to return to his room, but remain up in wc. Patient tilted back, leg rests on, seatbelt alarm on, call light within reach, son at bedside.    Therapy Documentation Precautions:  Precautions Precautions: Fall Precaution Comments: BP goal 130-160 per neuro MD; left side weakness, more impairment noted in the LE vs UE Other Brace:  AFO for LLE Restrictions Weight Bearing Restrictions: No     Therapy/Group: Individual Therapy; co -tx  Elizebeth Koller, PT, DPT, CBIS  03/26/2021, 12:37 PM

## 2021-03-26 NOTE — Progress Notes (Signed)
Physical Therapy Session Note  Patient Details  Name: Brent Young MRN: [id number] Date of Birth: November 05, 1960  Today's Date: 03/26/2021 PT Individual Time: 0800-0900 PT Individual Time Calculation (min): 60 min   Short Term Goals: Week 4:  PT Short Term Goal 1 (Week 4): STGs = LTGs  Skilled Therapeutic Interventions/Progress Updates:    Pt received sidelying in bed, reports he is on bedpan attempting to have a BM. Pt unable to void bowel, has been incontinent of urine in the bed. Rolling L/R with assist x 2 for dependent pericare and donning of brief as well as removal of bedpan. Supine to sit with max A for LE management and trunk elevation. Pt has onset of another nosebleed during session, nursing notified and supplied bag of ice to be applied to pt's nose. Pt is max A to change his hospital gown while seated EOB. Sit to stand with max A to stedy. Stedy transfer to [facility] chair. Sit to stand with min A to stedy from perched position on stedy. Pt left semi-reclined in TIS chair in room with needs in reach, quick release belt and chair alarm in place.  Therapy Documentation Precautions:  Precautions Precautions: Fall Precaution Comments: BP goal 130-160 per neuro MD; Dense L side deficits Other Brace: AFO for LLE Restrictions Weight Bearing Restrictions: No       Therapy/Group: Individual Therapy   Peter Congo, PT, DPT, CSRS  03/26/2021, 12:12 PM

## 2021-03-26 NOTE — Progress Notes (Addendum)
Patient ID: Brent Young, male   DOB: May 14, 1960, 61 y.o.   MRN: [id number]  SW met with pt in room at pt request to therapy.   SW discussed with pt d/c plan. He indicated that there is possibly a family home that he can stay in but has to see if this will be an option. SW reviewed with pt options as this time remain limited due being uninsured and facilities not willing to accept LOG. Pt reports his son is aware and they will discuss possible options.   *SW met with pt and pt son in room to discuss above. SW shared barriers to discharge. Pt and son encouraged to continue to explore family home as option.   Loaner w/c in room.   Will need to cancel DME order with [facility] if pt goes to SNF, return BSC from [facility], and return loaner w/c to [facility].  No SNF offers. SW sent referral to [facility] ([place]) and waiting on follow-up.   [name], MSW, [place] Office: [phone] Cell: [phone] Fax: [phone]

## 2021-03-26 NOTE — Progress Notes (Signed)
Occupational Therapy Session Note  Patient Details  Name: Brent Young MRN: [id number] Date of Birth: 04/21/1960  Today's Date: 03/26/2021 OT Individual Time: 1320-1402 OT Individual Time Calculation (min): 42 min    Short Term Goals: Week 4:  OT Short Term Goal 1 (Week 4): LTG=STG 2/2 ELOS  Skilled Therapeutic Interventions/Progress Updates:  Skilled OT intervention completed with focus on midline orientation with NMR on LUE as stabilizer during functional task, and activity participation. Pt received in TIS chair with pt's son in room, pt expressing fatigue from full day of therapy. Therapist fixed [place] as it was partly out of his nose and pt unaware. Pt initially a little emotional about d/c plans and situation regarding challenge with where he will d/c to, with therapist providing emotional support and encouragement for pt. Pt requesting to stay in room for seated activity due to fatigue. Pt participated in animal sorting task while sitting with BLEs on floor, and bottom forward on the chair with back unsupported. L hand weight bearing on therapist lap, and proximal support under L elbow during task for NMR. Pt separating animal figures by species, with pt able to remain sitting with supervision, with intermittent cues needed to correct L lateral lean. Education provided to pt on using visual target on wall, and periodically check to self-correct if pt can't feel where body is in space. Pt able to demonstrate self-correction without prompts on 3 occassions. Pt with min difficulty separating animal pieces. Therapist providing stabilizing support under L elbow/wrist during LUE reaching for animal pieces and handing into cup into R hand. Pt demonstrating great return in arm/hand, with only 2 episodes of dropping the pieces from L hand. Pt left in TIS chair, tilted about 50 degrees, with call bell on pt's intact side, son in room and all needs in reach at end of session.   Therapy  Documentation Precautions:  Precautions Precautions: Fall Precaution Comments: BP goal 130-160 per neuro MD; Dense L side deficits Other Brace: AFO for LLE Restrictions Weight Bearing Restrictions: No  Pain: No c/o pain   Therapy/Group: Individual Therapy  Vaiden Adames E Zaelyn Noack 03/26/2021, 7:44 AM

## 2021-03-26 NOTE — Progress Notes (Addendum)
Pt refused BiPAP this evening. He was educated on the use of the machine and encouraged to use it.   Pt had nose bleed with thick clots that he also coughed up. Oxygen nasal cannula at 2L humidified. Will monitor periodically for further bleeding.   0230 pt has another nose bleed with thick clots when blowing nose. Moderate amount of blood tinged sputum noted. Patient cleaned and resting. He stated he was feeling restless and anxious but will try and relax. Prn atarax given at 2305.   0349 nose bleed noted minimal clotting noted PA Pam called advised to apply pressure and ice to nose. Small amount of blood tinged sputum noted.  9326 pt complain of trouble breathing assisted pt to blow nose and moderate sized blood clot dislodged. Coughing small amounts of blood tinged sputum. Pt resting at this time with ice and pressure to brim of nose. No other concerns to report.

## 2021-03-26 NOTE — Progress Notes (Signed)
PROGRESS NOTE   Subjective/Complaints:  Pt reports more nosebleeds this AM- and overnight and yesterday- but pt admits to blowing the clots out regularly- so he keeps making the clots not solidify and I think adding to the nose bleeds.   Is receiving Afrin BID right now x 3 days- today is last day.   Pt said "there was a lot of blood"- I didn't think it would stop- Been so much blood- will check labs to make sure not dropping Hb. Labs pending.  Also will use vaseline in nares to help it not be dry.  On 2L O2.   ROS:   Pt denies SOB, abd pain, CP, N/V/C/D, and vision changes    Objective:   DG CHEST PORT 1 VIEW  Result Date: 03/25/2021 CLINICAL DATA:  Hypoxia, shortness of breath EXAM: PORTABLE CHEST 1 VIEW COMPARISON:  Chest radiograph 03/18/2021 FINDINGS: The heart is enlarged, unchanged. The mediastinal contours are stable. Linear opacities in both mid lungs are similar to prior studies and are favored to reflect scarring. Previously seen patchy opacities in the medial right base have improved. There is no new or worsening focal airspace disease. There is no significant pleural effusion. There is no pneumothorax. There is no acute osseous abnormality. IMPRESSION: Previously seen opacities in the medial right base have improved. Additional linear opacities in both lungs are unchanged and are favored to reflect scarring. No new or worsening focal airspace disease. Electronically Signed   By: Valetta Mole M.D.   On: 03/25/2021 14:38   No results for input(s): WBC, HGB, HCT, PLT in the last 72 hours.   No results for input(s): NA, K, CL, CO2, GLUCOSE, BUN, CREATININE, CALCIUM in the last 72 hours.      Intake/Output Summary (Last 24 hours) at 03/26/2021 1031 Last data filed at 03/26/2021 0721 Gross per 24 hour  Intake 476 ml  Output 100 ml  Net 376 ml        Physical Exam: Vital Signs Blood pressure 135/66, pulse 86,  temperature 98.3 F (36.8 C), resp. rate 14, height 6\' 1"  (1.854 m), weight 117.6 kg, SpO2 91 %.    General: awake, alert, appropriate, 2L O2 by [place]; sitting up in bed; son at bedside; NAD HENT: conjugate gaze; oropharynx dry- blowing nose regularly and blowing thick clots out of nose- sanguinous clots.  CV: regular rate; no JVD Pulmonary: decreased at bases, but sounds good otherwise;  GI: soft, NT, ND, (+)BS; protuberant Psychiatric: appropriate; interactive Neurological: alert Skin: left knee abrasion resolving Neuro:  more alert and aware. Remembers last night. Following commands with therapy.Marland KitchenSpeech clearer today.   Mild left central VII, mild left hemi-facial sensory loss. LUE 2+ deltoid, 3- biceps, tricep,   3 wrist/ HI-motor exam without substantial changes. Marland Kitchen LLE:   1+ to 2 HF, KE- , 0/5 distally-stable. No major changes despite lethargy in motor exam.  Left arm and leg 1/2 light touch. Still No resting tone.  Musculoskeletal: left knee without tenderness or effusion. Right shoulder pain still present with ROM   Assessment/Plan: 1. Functional deficits which require 3+ hours per day of interdisciplinary therapy in a comprehensive inpatient rehab setting. Physiatrist is providing  close team supervision and 24 hour management of active medical problems listed below. Physiatrist and rehab team continue to assess barriers to discharge/monitor patient progress toward functional and medical goals  Care Tool:  Bathing    Body parts bathed by patient: Chest, Abdomen, Front perineal area, Face, Left arm, Right upper leg, Left upper leg   Body parts bathed by helper: Right lower leg, Left lower leg, Right arm, Front perineal area, Buttocks     Bathing assist Assist Level: Maximal Assistance - Patient 24 - 49%     Upper Body Dressing/Undressing Upper body dressing   What is the patient wearing?: Pull over shirt    Upper body assist Assist Level: Maximal Assistance - Patient 25 -  49%    Lower Body Dressing/Undressing Lower body dressing      What is the patient wearing?: Pants, Underwear/pull up     Lower body assist Assist for lower body dressing: Maximal Assistance - Patient 25 - 49%     Toileting Toileting    Toileting assist Assist for toileting: 2 Helpers     Transfers Chair/bed transfer  Transfers assist  Chair/bed transfer activity did not occur: Safety/medical concerns  Chair/bed transfer assist level: 2 Helpers     Locomotion Ambulation   Ambulation assist   Ambulation activity did not occur: Safety/medical concerns          Walk 10 feet activity   Assist  Walk 10 feet activity did not occur: Safety/medical concerns        Walk 50 feet activity   Assist Walk 50 feet with 2 turns activity did not occur: Safety/medical concerns         Walk 150 feet activity   Assist Walk 150 feet activity did not occur: Safety/medical concerns         Walk 10 feet on uneven surface  activity   Assist Walk 10 feet on uneven surfaces activity did not occur: Safety/medical concerns         Wheelchair     Assist Is the patient using a wheelchair?: Yes Type of Wheelchair: Manual Wheelchair activity did not occur: Safety/medical concerns         Wheelchair 50 feet with 2 turns activity    Assist    Wheelchair 50 feet with 2 turns activity did not occur: Safety/medical concerns       Wheelchair 150 feet activity     Assist  Wheelchair 150 feet activity did not occur: Safety/medical concerns       Blood pressure 135/66, pulse 86, temperature 98.3 F (36.8 C), resp. rate 14, height 6\' 1"  (1.854 m), weight 117.6 kg, SpO2 91 %.  Medical Problem List and Plan: 1. 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 Got tilt in space w/c.  2. Impaired mobility: continue Lovenox             -antiplatelet therapy: Aspirin 325 mg daily and  Plavix 75 mg daily x3 months then aspirin alone 3. R shoulder pain from fall -continue Neurontin 300 mg 3 times daily, off oxycodone d/t "loopiness" -ROM, strengthening of R shoulder with therapies  -left knee xray reviewed. No injury.   4. Anxiety  Pt needs regular positive reassurance, anxious at times. Wonder if that is some of the problem at night  -may have to back off of xanax d/t lethargy             -antipsychotic agents: N/A 5. Neuropsych: This patient is capable of making decisions on his own behalf. 6. Skin/Wound Care: local care as needed 7. Fluids/Electrolytes/Nutrition: encourage PO  .recheck bmet monday 8.  Dysphagia.  a advanced to regular thins. SLP signed off  -take time, small sips 9.  Hypertension.  Norvasc 10 mg daily Avapro 75 mg daily, Toprol-XL 25 mg daily  12/31: slightly above goal, continue to monitor Vitals:   03/26/21 0409 03/26/21 0913  BP: 135/66   Pulse: 86   Resp:    Temp: 98.3 F (36.8 C)   SpO2: 96% 91%   1/4 BP cntrolled- con't regimen 10.  Diabetes mellitus.  Hemoglobin A1c 9.9.  SSI, Tradjenta 5 mg daily, Glucophage 1000 mg twice daily.   CBG (last 3)  Recent Labs    03/25/21 1639 03/25/21 2114 03/26/21 0618  GLUCAP 111* 102* 112*  1/3- Good BG control- con't regimen 11.  Hyperlipidemia.  Lipitor 12.  Tobacco use.  Counseling 13.  Seizure prophylaxis.  Continue Trileptal 300 mg 3 times daily 14. Constipation-  -moving bowels-continue senna-s at HS as mtc 15. Hypercarbia/hypoxia, likely OSA  -resolved  -appreciate PCCM consult. They have signed off -   lowered pressure bipap, RT working on mask -OOB, IS, FV -continue oxygen prn O2 sat <89% -needs outpt sleep study and pulmonary f/u -resources limited d/t uninsured status -entire team continues to provided daily, repeated education re: the importance of using his bipap/effects of sleep apnea 12/31-1/1: tolerated 1 hour last 2 nights, educated regarding importance of bipap   1/2- Pt tolerated 1 hour of BiPAP last night-  1/4- Pt only doing max 1 hour BiPAP- however based on last ABG, really needs BiPAP at night- will use as little O2 as possible due ot hypercarbia.   16.  Epistaxis mild likely related to O2 nasal canula, on humidified O2, Sats have looked good would avoid using O2 unless sats <89%  1/4- will check CBC to make sure that Hb not dropping - ordered this AM- is pending- afrin BID for 3 days from 1/2- and vaseline in nares to moisten them- asked pt to NOT blow nose to get clots out unless cannot breathe through nose.         LOS: 27 days A FACE TO FACE EVALUATION WAS PERFORMED  Brent Young 03/26/2021, 10:31 AM

## 2021-03-27 LAB — GLUCOSE, CAPILLARY
Glucose-Capillary: 92 mg/dL (ref 70–99)
Glucose-Capillary: 90 mg/dL (ref 70–99)
Glucose-Capillary: 104 mg/dL — ABNORMAL HIGH (ref 70–99)
Glucose-Capillary: 96 mg/dL (ref 70–99)

## 2021-03-27 LAB — CREATININE, SERUM
Creatinine, Ser: 0.71 mg/dL (ref 0.61–1.24)
GFR, Estimated: >60 mL/min (ref >60)

## 2021-03-27 NOTE — Progress Notes (Signed)
Patient ID: Brent Young, male   DOB: Nov 27, 1960, 61 y.o.   MRN: [id number]  SW waiting on updates from [facility] who reported nursing was reviewing.   [name], MSW, LCSWA Office: [phone] Cell: [phone] Fax: [phone]

## 2021-03-27 NOTE — Progress Notes (Signed)
PROGRESS NOTE   Subjective/Complaints:  No nose bleeds overnight. Says his nights have been a little better.  Says his sister says he can't stay on first floor of house  ROS: Patient denies fever, rash, sore throat, blurred vision, nausea, vomiting, diarrhea, cough, shortness of breath or chest pain, joint or back pain, headache, or mood change.    Objective:   DG CHEST PORT 1 VIEW  Result Date: 03/25/2021 CLINICAL DATA:  Hypoxia, shortness of breath EXAM: PORTABLE CHEST 1 VIEW COMPARISON:  Chest radiograph 03/18/2021 FINDINGS: The heart is enlarged, unchanged. The mediastinal contours are stable. Linear opacities in both mid lungs are similar to prior studies and are favored to reflect scarring. Previously seen patchy opacities in the medial right base have improved. There is no new or worsening focal airspace disease. There is no significant pleural effusion. There is no pneumothorax. There is no acute osseous abnormality. IMPRESSION: Previously seen opacities in the medial right base have improved. Additional linear opacities in both lungs are unchanged and are favored to reflect scarring. No new or worsening focal airspace disease. Electronically Signed   By: Valetta Mole M.D.   On: 03/25/2021 14:38   Recent Labs    03/26/21 1034  WBC 8.2  HGB 9.8*  HCT 29.6*  PLT 255     Recent Labs    03/26/21 1034 03/27/21 0524  NA 140  --   K 3.9  --   CL 99  --   CO2 33*  --   GLUCOSE 131*  --   BUN 13  --   CREATININE 0.66 0.71  CALCIUM 9.1  --         Intake/Output Summary (Last 24 hours) at 03/27/2021 0926 Last data filed at 03/27/2021 0719 Gross per 24 hour  Intake 660 ml  Output 400 ml  Net 260 ml        Physical Exam: Vital Signs Blood pressure (!) 151/82, pulse 89, temperature 97.7 F (36.5 C), resp. rate 20, height 6\' 1"  (1.854 m), weight 117.6 kg, SpO2 94 %.    Constitutional: No distress . Vital signs  reviewed. obese HEENT: NCAT, EOMI, oral membranes moist, O2 [place] Neck: supple Cardiovascular: RRR without murmur. No JVD    Respiratory/Chest: CTA Bilaterally without wheezes or rales. Normal effort    GI/Abdomen: BS +, non-tender, non-distended Ext: no clubbing, cyanosis, or edema Psych: pleasant and cooperative  Neurological: alert Skin: left knee abrasion resolving Neuro:  alert, functional memory and awareness.   Mild left central VII, mild left hemi-facial sensory loss. LUE 2+ deltoid, 3- biceps, tricep,   3 wrist/ HI-motor exam without substantial changes. Marland Kitchen LLE:   1+ to 2 HF, KE- , 0/5 distally--about the same as las tweek  .  Left arm and leg 1/2 light touch. Still No resting tone.  Musculoskeletal: left knee without tenderness or effusion. Right shoulder pain still present with ROM   Assessment/Plan: 1. Functional deficits which require 3+ hours per day of interdisciplinary therapy in a comprehensive inpatient rehab setting. Physiatrist is providing close team supervision and 24 hour management of active medical problems listed below. Physiatrist and rehab team continue to assess barriers to  discharge/monitor patient progress toward functional and medical goals  Care Tool:  Bathing    Body parts bathed by patient: Chest, Abdomen, Front perineal area, Face, Left arm, Right upper leg, Left upper leg   Body parts bathed by helper: Right lower leg, Left lower leg, Right arm, Front perineal area, Buttocks     Bathing assist Assist Level: Maximal Assistance - Patient 24 - 49%     Upper Body Dressing/Undressing Upper body dressing   What is the patient wearing?: Pull over shirt    Upper body assist Assist Level: Maximal Assistance - Patient 25 - 49%    Lower Body Dressing/Undressing Lower body dressing      What is the patient wearing?: Pants, Underwear/pull up     Lower body assist Assist for lower body dressing: Maximal Assistance - Patient 25 - 49%      Toileting Toileting    Toileting assist Assist for toileting: 2 Helpers     Transfers Chair/bed transfer  Transfers assist  Chair/bed transfer activity did not occur: Safety/medical concerns  Chair/bed transfer assist level: 2 Helpers     Locomotion Ambulation   Ambulation assist   Ambulation activity did not occur: Safety/medical concerns          Walk 10 feet activity   Assist  Walk 10 feet activity did not occur: Safety/medical concerns        Walk 50 feet activity   Assist Walk 50 feet with 2 turns activity did not occur: Safety/medical concerns         Walk 150 feet activity   Assist Walk 150 feet activity did not occur: Safety/medical concerns         Walk 10 feet on uneven surface  activity   Assist Walk 10 feet on uneven surfaces activity did not occur: Safety/medical concerns         Wheelchair     Assist Is the patient using a wheelchair?: Yes Type of Wheelchair: Manual Wheelchair activity did not occur: Safety/medical concerns         Wheelchair 50 feet with 2 turns activity    Assist    Wheelchair 50 feet with 2 turns activity did not occur: Safety/medical concerns       Wheelchair 150 feet activity     Assist  Wheelchair 150 feet activity did not occur: Safety/medical concerns       Blood pressure (!) 151/82, pulse 89, temperature 97.7 F (36.5 C), resp. rate 20, height 6\' 1"  (1.854 m), weight 117.6 kg, SpO2 94 %.  Medical Problem List and Plan: 1. 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 Pt will talk with sister as he is not able to go up stairs.. 2. Impaired mobility: continue Lovenox             -antiplatelet therapy: Aspirin 325 mg daily and Plavix 75  mg daily x3 months then aspirin alone 3. R shoulder pain from fall -continue Neurontin 300 mg 3 times daily, off oxycodone d/t "loopiness" -ROM, strengthening of R shoulder with therapies  -left knee xray reviewed. No injury.   4. Anxiety  Pt needs regular positive reassurance, anxious at  times. Wonder if that is some of the problem at night  -may have to back off of xanax d/t lethargy             -antipsychotic agents: N/A 5. Neuropsych: This patient is capable of making decisions on his own behalf. 6. Skin/Wound Care: local care as needed 7. Fluids/Electrolytes/Nutrition: encourage PO  .recheck bmet monday 8.  Dysphagia.  a advanced to regular thins. SLP signed off  -take time, small sips 9.  Hypertension.  Norvasc 10 mg daily Avapro 75 mg daily, Toprol-XL 25 mg daily    Vitals:   03/26/21 2042 03/27/21 0452  BP: (!) 148/77 (!) 151/82  Pulse: 90 89  Resp: 20 20  Temp: 98.5 F (36.9 C) 97.7 F (36.5 C)  SpO2: 94% 94%   1/5 BP borderline controlled- con't regimen 10.  Diabetes mellitus.  Hemoglobin A1c 9.9.  SSI, Tradjenta 5 mg daily, Glucophage 1000 mg twice daily.   CBG (last 3)  Recent Labs    03/26/21 1702 03/26/21 2109 03/27/21 0619  GLUCAP 101* 95 92  1/5- Good BG control- con't regimen 11.  Hyperlipidemia.  Lipitor 12.  Tobacco use.  Counseling 13.  Seizure prophylaxis.  Continue Trileptal 300 mg 3 times daily 14. Constipation-  -moving bowels-continue senna-s at HS as mtc 15. Hypercarbia/hypoxia, likely OSA  -resolved  -appreciate PCCM consult. They have signed off -  lowered pressure bipap, RT working on mask -OOB, IS, FV -continue oxygen prn O2 sat <89% -needs outpt sleep study and pulmonary f/u -resources limited d/t uninsured status -entire team continues to provided daily, repeated education re: the importance of using his bipap/effects of sleep apnea 1/5 continue to emphasize importance of bipap. He's trying  -hopefully we can get him unit a thoome   16.  Epistaxis mild likely related to O2 nasal canula-- -improved. -continue sprays, vasline to keep nasal mucosa moist.         LOS: 28 days A FACE TO [place] 03/27/2021, 9:26 AM

## 2021-03-27 NOTE — Progress Notes (Signed)
Physical Therapy Session Note  Patient Details  Name: Brent Young MRN: [id number] Date of Birth: [birth year]  Today's Date: 03/27/2021 PT Individual Time: 0930-1000 PT Individual Time Calculation (min): 30 min  and Today's Date: 03/27/2021 PT Co-Treatment Time: 0915-0930 PT Co-Treatment Time Calculation (min): 15 min  Short Term Goals: Week 4:  PT Short Term Goal 1 (Week 4): STGs = LTGs  Skilled Therapeutic Interventions/Progress Updates:     First 30 minutes of session skilled cotreat with OT. Pt working with OT and pt's son upon PT arrival. No complaint of pain. OT working on family ed for bed level toileting and brief and clothing change. PT assists with body mechanics for rolling and educating son on hand placement to assist with bed mobility. Pt then performs bilateral rolling to position hoyer sling, with PT providing cues on correct placement and sequencing of placement. Pt requires minA to roll to the L and maxA to roll to the R. PT then educates son on correct use of hoyer and son performs manual pumping to lift pt. Pt's hips noted to be sliding down out of hoyer, so pt is returned to bed and lift sling is removed and repositioned, with pt performing bilateral rolling to assist with placement. Pt's son again attempts to perform hoyer lift transfer and again pt's hips slide down in sling and requires pt to be returned to bed. PT has clinical supervisor come to room to ensure sling is in correct position for 3rd attempt. PT and supervisor perform hoyer transfer and again pt's hips slide down, but with PT and supervisor lifting hips, pt is able to transfer into tilt in space WC. Pt tilted back to 45 degrees. Left seated in WC with alarm intact and all needs within reach.  Therapy Documentation Precautions:  Precautions Precautions: Fall Precaution Comments: BP goal 130-160 per neuro MD; left side weakness, more impairment noted in the LE vs UE Other Brace: AFO for  LLE Restrictions Weight Bearing Restrictions: No    Therapy/Group: Individual Therapy  Beau Fanny, PT, DPT 03/27/2021, 4:05 PM

## 2021-03-27 NOTE — Progress Notes (Addendum)
Occupational Therapy Session Note  Patient Details  Name: Brent Young MRN: [id number] Date of Birth: 05-Jun-1960  Today's Date: 03/27/2021 OT Individual Time: 1101-1210 OT Individual Time Calculation (min): 69 min    Short Term Goals: Week 4:  OT Short Term Goal 1 (Week 4): LTG=STG 2/2 ELOS  Skilled Therapeutic Interventions/Progress Updates:  Pt greeted seated in TIS agreeable to OT intervention. Session focus on sitting balance, lateral scooting, functional mobility, stress mgmt/coping strategies and decreasing overall caregiver burden.  Pt transported to gym with total A where pt completed SB transfer from TIS>EOM to R side with MAX A +2, pt with decreased ability to scoot this session with pt presenting with posterior pelvic tilt. Education and demo provided of importance of anteriorly weight shifting and head to hip ratio when scooting. Worked on lateral scoots to EOM going to pts R side with pt still needing MAX A to scoot. Pt noted to be outwardly frustrated with pt sharing frustrations with DC plan and family dynamics regarding his sister being un agreeable to allowing him to DC home to first floor. Pt emotional and tearful reporting concern for his sons quality of life and not wanting to bear this burden any longer. Provided emotional support and therapeutic use of self. Education provided on coping strategies and stress mgmt techniques with pt reporting liking to talk to his buddies on the phone and watching the [place] cowboys, looked up next [place] game and encouraged pt to call his friend once returning to room. Pt reports feeling better after expressing emotions. Pt completed sit<>stand in stedy with MOD A +2, total A transfer back to TIS. Pt transported back to room with total A where pt left seated in TIS with all needs within reach.                    Pt on 2L [place] during session with SpO2 >92% during session   Therapy Documentation Precautions:  Precautions Precautions:  Fall Precaution Comments: BP goal 130-160 per neuro MD; left side weakness, more impairment noted in the LE vs UE Other Brace: AFO for LLE Restrictions Weight Bearing Restrictions: No  Pain: no pain reported during session     Therapy/Group: Individual Therapy  Pollyann Glen [facility] 03/27/2021, 2:30 PM

## 2021-03-27 NOTE — Progress Notes (Signed)
Inpatient Rehabilitation Discharge Medication Review by a Pharmacist  A complete drug regimen review was completed for this patient to identify any potential clinically significant medication issues.  High Risk Drug Classes Is patient taking? Indication by Medication  Antipsychotic No   Anticoagulant No   Antibiotic No   Opioid No   Antiplatelet Yes Plavix, Aspirin for CVA  Hypoglycemics/insulin Yes Metformin, Linagliptin for DM  Vasoactive Medication Yes Amlodipine, irbesartan for BP  Chemotherapy No   Other Yes Gabapentin for pain Atorvastatin for HLD Trileptal for seizures Dulera for asthma, COPD Protonix for GERD     Type of Medication Issue Identified Description of Issue Recommendation(s)  Drug Interaction(s) (clinically significant)     Duplicate Therapy     Allergy     No Medication Administration End Date     Incorrect Dose     Additional Drug Therapy Needed     Significant med changes from prior encounter (inform family/care partners about these prior to discharge).    Other       Clinically significant medication issues were identified that warrant physician communication and completion of prescribed/recommended actions by midnight of the next day:  No  Pharmacist comments: None  Time spent performing this drug regimen review (minutes):  20 minutes   Elwin Sleight 03/27/2021 9:15 AM

## 2021-03-27 NOTE — Progress Notes (Signed)
Occupational Therapy Session Note  Patient Details  Name: Brent Young MRN: [id number] Date of Birth: 11/01/1960  Today's Date: 03/27/2021 Session 1 OT Individual Time: 6761-9509 OT Individual Time Calculation (min): 45 min   Session 2 OT Individual Time: 1405-1500 OT Individual Time Calculation (min): 55 min    Short Term Goals: Week 4:  OT Short Term Goal 1 (Week 4): LTG=STG 2/2 ELOS  Skilled Therapeutic Interventions/Progress Updates:  Session 1   Pt greeted semi-reclined in bed with pt's son Brent Young present for family education. Pt on 2L of O2 throughout session. Pt reported need to have a BM. OT educated on bed level toileting strategies with focus on bed pan placement and hygiene. Pt;s son Brent Young demonstrated understanding of rolling and bed pan placement. Pt unable to have a BM and no void of bladder. Educated on rolling again for donning brief and pulling up pants. UB vbathing/dressing also completed at bed level with education for technique and to have ot complete as much as he can. Rolling again to pull shirt down in back. PT present for COTX on second onlf of sesssion. Education providing for sling placememnt for [employer]. Brent Young then worked on Theme park manager,. When lifting pt, buttocks began to slide out of sling requiring therapists to assist safely back to EOB. Total A to safely return to supine to re-adjust. Pt ledft in care of PT and son for continued therapy session.  Pain: Pain Assessment Pain Scale: 0-10 Pain Score: 0-No pain  Session 2 Pt greeted seated in TIS wc and agreeable to OT treatment session. Pt reported need to have a BM. Pt stood with mod A of 1 with great power up! He was able to maintain standing with full hip extension while OT placed Stedy seat. Pt able to maintain upright position while Stedy moved over wide BSC. Pt needed OT assist for clothing management and had already been incontinent of urine. Pt unable to void or have BM, but did pass  gas. Worked on washing legs seated on BSC and pt was able to wash peri-area today. OT assist to thread pant legs, then Mod A of 1 to stand in stedy again while OT pulled pants up over hips. Practiced more sit<>stands from stedy with much improved hip and trunk extension and power up through legs. Pt transferred back to bed with Stedy. Worked on sitting balance at EOB with min A progressing to close supervision. Pt returned to bed with max A of 1. Pt left semi-reclined in bed with bed alarm on, call bell in reach and needs met.   Therapy Documentation Precautions:  Precautions Precautions: Fall Precaution Comments: BP goal 130-160 per neuro MD; left side weakness, more impairment noted in the LE vs UE Other Brace: AFO for LLE Restrictions Weight Bearing Restrictions: No Pain:  Denies pain  Therapy/Group: Individual Therapy  Valma Cava 03/27/2021, 3:43 PM

## 2021-03-28 LAB — GLUCOSE, CAPILLARY
Glucose-Capillary: 96 mg/dL (ref 70–99)
Glucose-Capillary: 103 mg/dL — ABNORMAL HIGH (ref 70–99)
Glucose-Capillary: 93 mg/dL (ref 70–99)
Glucose-Capillary: 116 mg/dL — ABNORMAL HIGH (ref 70–99)

## 2021-03-28 NOTE — Progress Notes (Signed)
Physical Therapy Session Note  Patient Details  Name: Brent Young MRN: [id number] Date of Birth: 1960-04-30  Today's Date: 03/28/2021 PT Individual Time: 0950-1115 PT Individual Time Calculation (min): 85 min   Short Term Goals: Week 4:  PT Short Term Goal 1 (Week 4): STGs = LTGs  Skilled Therapeutic Interventions/Progress Updates:     Pt received seated in WC and agrees to therapy. No complaint of pain. WC transport to gym for time management. Session focused on gait training and NMR in parallel bars. Pt performs multiple reps of sit to stand during session, requiring as little as CGA when pt sequences correctly and with cues for initiation, anterior weight shifting, and increased glute engagement for hip extension. At times pt requires minA but otherwise demos significant improvement in independence with transfer. Initially, pt performs L lateral weight shifting with cue to "tap L hip to parallel bar". PT provides tactile cueing at L quad and L glute to provide NM feedback and increased motor activation. Pt performs multiple bouts with seated rest breaks in between. Pt then performs gait training in parallel bars, with +2 for WC follow for safety. Mirror provided for visual feedback. Pt ambulates 4x6' with modA overall, with cues for lateral weight shifting, hip extension, L foot progression and increased quad and glute activation. PT provides blocking of L knee but no overt buckling noted.   Pt then performs targeted stepping with R leg to promote L lateral weight shifting, 3x5, up onto 2 inch step. PT provides modA for stability and to promote optimal sequencing and mechanics.   WC transport back to room. Left seated with alarm intact and all needs within reach.  Therapy Documentation Precautions:  Precautions Precautions: Fall Precaution Comments: BP goal 130-160 per neuro MD; left side weakness, more impairment noted in the LE vs UE Other Brace: AFO for LLE Restrictions Weight  Bearing Restrictions: No   Therapy/Group: Individual Therapy  Beau Fanny, PT, DPT 03/28/2021, 12:26 PM

## 2021-03-28 NOTE — Plan of Care (Signed)
°  Problem: RH Balance Goal: LTG: Patient will maintain dynamic sitting balance (OT) Description: LTG:  Patient will maintain dynamic sitting balance with assistance during activities of daily living (OT) Flowsheets (Taken 03/28/2021 1502) LTG: Pt will maintain dynamic sitting balance during ADLs with: Minimal Assistance - Patient > 75% Note: Goal downgraded 1/6 due to lack of progress-ESD   Problem: RH Attention Goal: LTG Patient will demonstrate this level of attention during functional activites (OT) Description: LTG:  Patient will demonstrate this level of attention during functional activites  (OT) Flowsheets (Taken 03/28/2021 1502) Patient will demonstrate this level of attention during functional activites: Selective Patient will demonstrate above attention level in the following environment: Home LTG: Patient will demonstrate this level of attention during functional activites (OT): Supervision   Problem: RH Awareness Goal: LTG: Patient will demonstrate awareness during functional activites type of (OT) Description: LTG: Patient will demonstrate awareness during functional activites type of (OT) Flowsheets (Taken 03/28/2021 1502) Patient will demonstrate awareness during functional activites type of: Emergent LTG: Patient will demonstrate awareness during functional activites type of (OT): Minimal Assistance - Patient > 75%

## 2021-03-28 NOTE — Progress Notes (Signed)
Occupational Therapy Weekly Progress Note  Patient Details  Name: Brent Young MRN: [id number] Date of Birth: [birth year]  Beginning of progress report period: February 28, 2021 End of progress report period: March 28, 2021  Today's Date: 03/28/2021 Session 1 Today's Date: 03/28/2021 OT Individual Time: 8295-6213 OT Individual Time Calculation (min): 72 min   Session 2 OT Individual Time: 0865-7846 OT Individual Time Calculation (min): 30 min   Session 3 OT Individual Time: 9629-5284 OT Individual Time Calculation (min): 30 min   Patient's discharge has been delayed as patients sister has stated patient can not move back into her house unless he can go upstairs to his room. This is NOT an option as patient is not ambulatory. Patient has made some progress this week towards his long term goals. Long term goals were downgraded to an overall mod/max A level for functional BADL tasks and transfers. For the last two days, patient has demonstrated improved attention, awareness, and motor planning within functional tasks. He has been able to stand in stedy with as little as min A, but has difficulty with transitional movements. We have been providing family education to son Brent Young using Harrel Lemon lift was well as slideboard for transfers as pt is unable to afford a Stedy at [employer]. Currently, patient is continuing to make progress as we problem solve discharge plan.  Patient continues to demonstrate the following deficits: muscle weakness, decreased cardiorespiratoy endurance and decreased oxygen support, impaired timing and sequencing, abnormal tone, unbalanced muscle activation, ataxia, decreased coordination, and decreased motor planning, decreased attention to left and decreased motor planning, decreased attention, decreased awareness, decreased problem solving, decreased safety awareness, and decreased memory, and decreased sitting balance, decreased standing balance, decreased postural control,  hemiplegia, and decreased balance strategies and therefore will continue to benefit from skilled OT intervention to enhance overall performance with BADL and Reduce care partner burden.  Patient progressing toward long term goals..  Continue plan of care.  OT Short Term Goals Week 5:  OT Short Term Goal 1 (Week 5): LTG=STG 2.2 ELOS  Skilled Therapeutic Interventions/Progress Updates:  Session 1   Patient greeted semi-reclined in bed with nurse tech present assisted with cleaning up after urinal spilt in bed. Pt needed max A for bed mobility, then then stood in [place] from raised bed with supervision! Pt transferred onto wide BSC in shower. Bathing completed with pt able to grasp wash cloth with L UE and assist with bathing tasks for neuro re-ed today. Pt needed assistance to wash lower legs and buttocks using commode cut out. Stedy transfer out of shower with Min A. Worked on dressing technqiues seated EOB with min/mod A for dynamic sitting balance without UE support. SiT<>stands from EOB with max A +2 to pull up pants. OT then provided demonstration and education to son Brent Young on slideboard transfer to TIS wc using Bobath method. Pt needed max A and max cues initially to push through legs and to understand head/hips relationship. [name] practiced transfer and felt more comfortable with this method to keep patient's weight forward. Practiced sit<>stand at the sink with [name]. OT educated extensively on importance on L LE positioning proir to standing. OT demonstrated how to block L knee and keep L foot from sliding out. [name] demonstrated understanding and was able to stand pt at the sink and pull his pants up for him. Pt left seated in TIS wc with alarm belt on, call bell in reach, and needs met.   Session 2 Pt greeted seated in  TIS wc and agreeable to OT treatment session. Pt brought to therapy gym and worked on functional use of L UE with focus on pinch strength. Utilized medium strength red clothes  pins with OT facilitation at wrist for normal movement patterns. Educated on stroke recovery in regards to UE function, brain plasticity, and Brunstrom levels. Pt returned to room and left seated in TIS wc with alarm belt on, call bell in reach, and needs met.  Session 3 Pt greeted semi-reclined in bed and agreeable to OT treatment session. Pt agreeable to get OOB again. Max A for bed mobility due to L hemiplegia. Once seated EOB, pt needed min A progressing to CGA for static sitting for short time. Stedy placed and pt stood with min A and cues to power through lower legs. DSS called pt's son and OT educated son on patients current LOC. Pt then performed L UE there-ex using cross body punches on pillow, isolated wrist flex/ext and elbow flex/ext. Pt left seated in TIS wc at end of session with wc tilted to ~45 degrees, call bell in reach, alarm belt on, and needs met.     Therapy Documentation Precautions:  Precautions Precautions: Fall Precaution Comments: BP goal 130-160 per neuro MD; left side weakness, more impairment noted in the LE vs UE Other Brace: AFO for LLE Restrictions Weight Bearing Restrictions: No Vital Signs: Therapy Vitals Oxygen Therapy SpO2: 96 % O2 Device: 2L Nasal Cannula Pain:  Denies pain   Therapy/Group: Individual Therapy  Valma Cava 03/28/2021, 3:15 PM

## 2021-03-28 NOTE — Progress Notes (Signed)
Patient ID: Brent Young, male   DOB: [birth year], 61 y.o.   MRN: [id number]  SW received updates from nursing that pt requested to meet with SW.   *SW met with pt and pt son [name] in room. Pt wanted to discuss challenges with housing, and is st ill working on seeing if he can stay in the family home. He asked if DSS could help him get to his family home. SW explained to pt he would need to consult an attorney to get insight on how ot manage situation. SW encouraged him to speak with other family members about the home, and SW will continue to pursue other options.   SW received message from Clive/[name] [phone]) who accepted LOG.   SW waiting on updates from Zack/TOC Supervisor on if able to get pt approved.   [name], MSW, [place] Office: [phone] Cell: [phone] Fax: [phone]

## 2021-03-28 NOTE — Progress Notes (Signed)
Pt requested for this nurse to remove the CPAP. CPAP removed, 2L O2 [place] placed on the pt.

## 2021-03-28 NOTE — Progress Notes (Addendum)
PROGRESS NOTE   Subjective/Complaints:  Therapy reporting improved mobility over last 24+ hours. Pt is sleeping ok. At least trying to use the bipap for around an hour.   ROS: Patient denies fever, rash, sore throat, blurred vision, nausea, vomiting, diarrhea, cough, shortness of breath or chest pain,  headache, or mood change.    Objective:   No results found. Recent Labs    03/26/21 1034  WBC 8.2  HGB 9.8*  HCT 29.6*  PLT 255     Recent Labs    03/26/21 1034 03/27/21 0524  NA 140  --   K 3.9  --   CL 99  --   CO2 33*  --   GLUCOSE 131*  --   BUN 13  --   CREATININE 0.66 0.71  CALCIUM 9.1  --         Intake/Output Summary (Last 24 hours) at 03/28/2021 1024 Last data filed at 03/28/2021 0758 Gross per 24 hour  Intake 1080 ml  Output 600 ml  Net 480 ml        Physical Exam: Vital Signs Blood pressure 133/71, pulse 91, temperature 98.3 F (36.8 C), temperature source Oral, resp. rate 18, height 6\' 1"  (1.854 m), weight 117.6 kg, SpO2 97 %.    Constitutional: No distress . Vital signs reviewed. obese HEENT: NCAT, EOMI, oral membranes moist Neck: supple Cardiovascular: RRR without murmur. No JVD    Respiratory/Chest: CTA Bilaterally without wheezes or rales. Normal effort    GI/Abdomen: BS +, non-tender, non-distended Ext: no clubbing, cyanosis, or edema Psych: pleasant and cooperative  Neurological: alert Skin: left knee abrasion resolving Neuro:  more alert and on point. Functional memory and awareness. verbose   Mild left central VII, mild left hemi-facial sensory loss. LUE 2+ deltoid, 3- biceps, tricep,   3 wrist/ HI-motor exam without substantial changes. LLE:   1+ to 2 HF, KE- , 0-tr/5 distally-stable .  Left arm and leg 1/2 light touch. Still No resting tone.  Musculoskeletal: right shoulder pain with AROM   Assessment/Plan: 1. Functional deficits which require 3+ hours per day of  interdisciplinary therapy in a comprehensive inpatient rehab setting. Physiatrist is providing close team supervision and 24 hour management of active medical problems listed below. Physiatrist and rehab team continue to assess barriers to discharge/monitor patient progress toward functional and medical goals  Care Tool:  Bathing    Body parts bathed by patient: Chest, Abdomen, Front perineal area, Face, Left arm, Right upper leg, Left upper leg   Body parts bathed by helper: Right arm, Buttocks, Right lower leg, Left lower leg     Bathing assist Assist Level: Moderate Assistance - Patient 50 - 74%     Upper Body Dressing/Undressing Upper body dressing   What is the patient wearing?: Button up shirt    Upper body assist Assist Level: Moderate Assistance - Patient 50 - 74%    Lower Body Dressing/Undressing Lower body dressing      What is the patient wearing?: Underwear/pull up, Pants     Lower body assist Assist for lower body dressing: Maximal Assistance - Patient 25 - 49%     Toileting Toileting  Toileting assist Assist for toileting: Maximal Assistance - Patient 25 - 49%     Transfers Chair/bed transfer  Transfers assist  Chair/bed transfer activity did not occur: Safety/medical concerns  Chair/bed transfer assist level: 2 Helpers (MAX A +2 SB txfr)     Locomotion Ambulation   Ambulation assist   Ambulation activity did not occur: Safety/medical concerns          Walk 10 feet activity   Assist  Walk 10 feet activity did not occur: Safety/medical concerns        Walk 50 feet activity   Assist Walk 50 feet with 2 turns activity did not occur: Safety/medical concerns         Walk 150 feet activity   Assist Walk 150 feet activity did not occur: Safety/medical concerns         Walk 10 feet on uneven surface  activity   Assist Walk 10 feet on uneven surfaces activity did not occur: Safety/medical concerns          Wheelchair     Assist Is the patient using a wheelchair?: Yes Type of Wheelchair: Manual Wheelchair activity did not occur: Safety/medical concerns         Wheelchair 50 feet with 2 turns activity    Assist    Wheelchair 50 feet with 2 turns activity did not occur: Safety/medical concerns       Wheelchair 150 feet activity     Assist  Wheelchair 150 feet activity did not occur: Safety/medical concerns       Blood pressure 133/71, pulse 91, temperature 98.3 F (36.8 C), temperature source Oral, resp. rate 18, height 6\' 1"  (1.854 m), weight 117.6 kg, SpO2 97 %.  Medical Problem List and Plan: 1. 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 Still has nowhere accessible he can stay.   -Continue CIR therapies including PT, OT  2. Impaired mobility: continue Lovenox             -antiplatelet therapy: Aspirin 325 mg daily and Plavix 75 mg daily x3 months then aspirin alone 3. R shoulder pain from fall -continue Neurontin 300 mg 3 times daily, off oxycodone d/t "loopiness" -ROM, strengthening of R shoulder with therapies 4. Anxiety  Pt needs regular positive reassurance, anxious at times. Wonder if that is some of the problem at night  -may have to back off of xanax d/t lethargy             -antipsychotic agents: N/A 5. Neuropsych: This patient is capable of making decisions on his own behalf. 6. Skin/Wound Care: local care as needed 7. Fluids/Electrolytes/Nutrition: encourage PO  F/u BMET monday 8.  Dysphagia.  a advanced to regular thins. SLP signed off  -take time, small sips 9.  Hypertension.  Norvasc 10 mg daily Avapro 75 mg daily, Toprol-XL 25 mg daily    Vitals:   03/28/21 0115 03/28/21 0309  BP:  133/71   Pulse:  91  Resp:  18  Temp:  98.3 F (36.8 C)  SpO2: 95% 97%   1/6 BP improving control 10.  Diabetes mellitus.  Hemoglobin A1c 9.9.  SSI, Tradjenta 5  mg daily, Glucophage 1000 mg twice daily.   CBG (last 3)  Recent Labs    03/27/21 1700 03/27/21 2133 03/28/21 0613  GLUCAP 104* 96 96  1/6- Good BG control- con't regimen 11.  Hyperlipidemia.  Lipitor 12.  Tobacco use.  Counseling 13.  Seizure prophylaxis.  Continue Trileptal 300 mg 3 times daily 14. Constipation-  -moving bowels-continue senna-s at HS as mtc 15. Hypercarbia/hypoxia, likely OSA  -  PCCM consulted. They have signed off -  lowered pressure bipap, RT working on mask -OOB, IS, FV -continue oxygen prn O2 sat <89% -needs outpt sleep study and pulmonary f/u -resources limited d/t uninsured status -pt at least is trying to wear bipap at night. Have talked to him daily about the importance and need for use.   16.  Epistaxis mild likely related to O2 nasal canula-- -improved. -continue sprays, vasline to keep nasal mucosa moist.     At least 35 total minutes were spent in examination of patient, assessment of pertinent data,  formulation of a treatment plan, and in discussion with patient and/or family.      LOS: 29 days A FACE TO FACE EVALUATION WAS PERFORMED  Ranelle Oyster 03/28/2021, 10:24 AM

## 2021-03-28 NOTE — Progress Notes (Signed)
Pt refused BIPAP tonight 

## 2021-03-29 LAB — GLUCOSE, CAPILLARY
Glucose-Capillary: 101 mg/dL — ABNORMAL HIGH (ref 70–99)
Glucose-Capillary: 96 mg/dL (ref 70–99)
Glucose-Capillary: 108 mg/dL — ABNORMAL HIGH (ref 70–99)

## 2021-03-29 MED ORDER — FLUTICASONE PROPIONATE 50 MCG/ACT NA SUSP
2.0000 | Freq: Every day | NASAL | Status: DC
  Administered 2021-03-29 – 2021-04-03 (×6): 2 via NASAL
  Filled 2021-03-29: qty 16

## 2021-03-29 MED ORDER — ALBUTEROL SULFATE (2.5 MG/3ML) 0.083% IN NEBU
2.5000 mg | INHALATION_SOLUTION | Freq: Four times a day (QID) | RESPIRATORY_TRACT | Status: DC | PRN
  Administered 2021-03-29: 2.5 mg via RESPIRATORY_TRACT
  Filled 2021-03-29: qty 3

## 2021-03-29 NOTE — Progress Notes (Signed)
PROGRESS NOTE   Subjective/Complaints:  Just trying to breathe- says is stopped up/nose.   Also feels wheezy.   ROS:  Pt denies SOB, abd pain, CP, N/V/C/D, and vision changes   Objective:   No results found. No results for input(s): WBC, HGB, HCT, PLT in the last 72 hours.    Recent Labs    03/27/21 0524  CREATININE 0.71        Intake/Output Summary (Last 24 hours) at 03/29/2021 1436 Last data filed at 03/29/2021 1251 Gross per 24 hour  Intake 360 ml  Output 150 ml  Net 210 ml        Physical Exam: Vital Signs Blood pressure 134/69, pulse 93, temperature 98.9 F (37.2 C), resp. rate 18, height 6\' 1"  (1.854 m), weight 117.6 kg, SpO2 92 %.     General: awake, alert, appropriate,sitting up in bed; sounds nasal;  NAD HENT: conjugate gaze; oropharynx moist- wearing O2 by [place] CV: regular rate; no JVD Pulmonary: little wheezy- good air movement GI: soft, NT, ND, (+)BS- normoacitve; protuberant Psychiatric: appropriate Neurological: alert Ext: no clubbing, cyanosis, or edema Psych: pleasant and cooperative  Neurological: alert Skin: left knee abrasion resolving Neuro:  more alert and on point. Functional memory and awareness. verbose   Mild left central VII, mild left hemi-facial sensory loss. LUE 2+ deltoid, 3- biceps, tricep,   3 wrist/ HI-motor exam without substantial changes. Marland Kitchen LLE:   1+ to 2 HF, KE- , 0-tr/5 distally-stable .  Left arm and leg 1/2 light touch. Still No resting tone.  Musculoskeletal: right shoulder pain with AROM   Assessment/Plan: 1. Functional deficits which require 3+ hours per day of interdisciplinary therapy in a comprehensive inpatient rehab setting. Physiatrist is providing close team supervision and 24 hour management of active medical problems listed below. Physiatrist and rehab team continue to assess barriers to discharge/monitor patient progress toward functional and  medical goals  Care Tool:  Bathing    Body parts bathed by patient: Chest, Abdomen, Front perineal area, Face, Left arm, Right upper leg, Left upper leg   Body parts bathed by helper: Right arm, Buttocks, Right lower leg, Left lower leg     Bathing assist Assist Level: Maximal Assistance - Patient 24 - 49%     Upper Body Dressing/Undressing Upper body dressing   What is the patient wearing?: Button up shirt    Upper body assist Assist Level: Moderate Assistance - Patient 50 - 74%    Lower Body Dressing/Undressing Lower body dressing      What is the patient wearing?: Underwear/pull up, Pants     Lower body assist Assist for lower body dressing: Maximal Assistance - Patient 25 - 49%     Toileting Toileting    Toileting assist Assist for toileting: Maximal Assistance - Patient 25 - 49%     Transfers Chair/bed transfer  Transfers assist  Chair/bed transfer activity did not occur: Safety/medical concerns  Chair/bed transfer assist level: 2 Helpers (MAX A +2 SB txfr)     Locomotion Ambulation   Ambulation assist   Ambulation activity did not occur: Safety/medical concerns          Walk 10 feet  activity   Assist  Walk 10 feet activity did not occur: Safety/medical concerns        Walk 50 feet activity   Assist Walk 50 feet with 2 turns activity did not occur: Safety/medical concerns         Walk 150 feet activity   Assist Walk 150 feet activity did not occur: Safety/medical concerns         Walk 10 feet on uneven surface  activity   Assist Walk 10 feet on uneven surfaces activity did not occur: Safety/medical concerns         Wheelchair     Assist Is the patient using a wheelchair?: Yes Type of Wheelchair: Manual Wheelchair activity did not occur: Safety/medical concerns         Wheelchair 50 feet with 2 turns activity    Assist    Wheelchair 50 feet with 2 turns activity did not occur: Safety/medical  concerns       Wheelchair 150 feet activity     Assist  Wheelchair 150 feet activity did not occur: Safety/medical concerns       Blood pressure 134/69, pulse 93, temperature 98.9 F (37.2 C), resp. rate 18, height 6\' 1"  (1.854 m), weight 117.6 kg, SpO2 92 %.  Medical Problem List and Plan: 1. 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 Still has nowhere accessible he can stay.   -Continue CIR- PT, OT and SLP 2. Impaired mobility: continue Lovenox             -antiplatelet therapy: Aspirin 325 mg daily and Plavix 75 mg daily x3 months then aspirin alone 3. R shoulder pain from fall -continue Neurontin 300 mg 3 times daily, off oxycodone d/t "loopiness" -ROM, strengthening of R shoulder with therapies 4. Anxiety  Pt needs regular positive reassurance, anxious at times. Wonder if that is some of the problem at night  -may have to back off of xanax d/t lethargy             -antipsychotic agents: N/A 5. Neuropsych: This patient is capable of making decisions on his own behalf. 6. Skin/Wound Care: local care as needed 7. Fluids/Electrolytes/Nutrition: encourage PO  F/u BMET monday 8.  Dysphagia.  a advanced to regular thins. SLP signed off  -take time, small sips 9.  Hypertension.  Norvasc 10 mg daily Avapro 75 mg daily, Toprol-XL 25 mg daily    Vitals:   03/29/21 0829 03/29/21 1251  BP:  134/69  Pulse: 79 93  Resp: 18 18  Temp:  98.9 F (37.2 C)  SpO2: 98% 92%   1/7- BP controlled- con't regimen 10.  Diabetes mellitus.  Hemoglobin A1c 9.9.  SSI, Tradjenta 5 mg daily, Glucophage 1000 mg twice daily.   CBG (last 3)  Recent Labs    03/28/21 2148 03/29/21 0623 03/29/21 1133  GLUCAP 116* 101* 96  1/7- Good Bg control-  con't regimen 11.  Hyperlipidemia.  Lipitor 12.  Tobacco use.  Counseling 13.  Seizure prophylaxis.  Continue Trileptal 300 mg 3 times daily 14. Constipation-  -moving bowels-continue senna-s at HS as mtc 15. Hypercarbia/hypoxia, likely OSA  -  PCCM  consulted. They have signed off -  lowered pressure bipap, RT working on mask -OOB, IS, FV -continue oxygen prn O2 sat <89% -needs outpt sleep study and pulmonary f/u -resources limited d/t uninsured status -pt at least is trying to wear bipap at night. Have talked to him daily about the importance and need for use.  1/7- start Albuterol nebs for wheezing- prn- q6 hours prn.   16.  Epistaxis mild likely related to O2 nasal canula-- -improved. -continue sprays, vasline to keep nasal mucosa moist.  1/7- will try Flonase for nasal congestion? Will monitor for nose bleed.      LOS: 30 days A FACE TO FACE EVALUATION WAS PERFORMED  Laraine Samet 03/29/2021, 2:36 PM

## 2021-03-29 NOTE — Progress Notes (Signed)
Pt refused to be placed on bipap tonight

## 2021-03-30 LAB — GLUCOSE, CAPILLARY
Glucose-Capillary: 98 mg/dL (ref 70–99)
Glucose-Capillary: 90 mg/dL (ref 70–99)
Glucose-Capillary: 108 mg/dL — ABNORMAL HIGH (ref 70–99)
Glucose-Capillary: 102 mg/dL — ABNORMAL HIGH (ref 70–99)

## 2021-03-30 NOTE — Progress Notes (Signed)
Physical Therapy Session Note  Patient Details  Name: Brent Young MRN: [id number] Date of Birth: [birth year]  Today's Date: 03/30/2021 PT Individual Time: 0910-1005 PT Individual Time Calculation (min): 55 min   Short Term Goals: Week 4:  PT Short Term Goal 1 (Week 4): STGs = LTGs  Skilled Therapeutic Interventions/Progress Updates:     Patient in TIS w/c on 2.5 L/min via [place] upon PT arrival. Patient alert and agreeable to PT session. Patient reported mild L knee pain during session, RN made aware. Pain improved with increased facilitation and use of brace to prevent genu recurvatum in standing and gait activities. PT provided repositioning, rest breaks, and distraction as pain interventions throughout session.   Patient was transported in TIS dependently to/from main therapy gym for time/energy management. Focused session on NMR for lower extremity gait and balance progression and improved motor control and positioning or L lower extremity in weight bearing with use of L PLS AFO and 1/8" heel wedge to reduced genu recurvatum and accommodate reduced DF ROM.  Neuromuscular Re-ed: Patient performed the following balance and lower motor extremity motor control activities in the // bars with B upper extremity support in front of a mirror for visual feedback: -sit to stand x8 with min A-CGA focused on L foot placement, forward weight shift and hip/knee/trunk extension with controlled timing for reduced L knee hyperextension and hip flexion in standing -reciprocal stepping forward/gait training x8 feet and 4 feet x2 with mod A for weight shifting, max A for control of R knee and limb progression due to lack of DF activation and reduced hamstring activation in pre-swing; focused on hip/trunk extension and knee control in L stance and hamstring activation for improved swing phase with multimodal cues -standing balance progressing to marching in place 2x60-90 sec with AFO and heel wedge, see above,  with improved L knee control in stance, patient able to return to hyperextension with increased hip flexion, provided visual and tactile block to reduce hip flexion  Patient in TIS w/c in the room at end of session with breaks locked, seat belt alarm set, and all needs within reach.   Therapy Documentation Precautions:  Precautions Precautions: Fall Precaution Comments: BP goal 130-160 per neuro MD; left side weakness, more impairment noted in the LE vs UE Other Brace: AFO for LLE Restrictions Weight Bearing Restrictions: No    Therapy/Group: Individual Therapy  [name] PT, DPT  03/30/2021, 8:18 PM

## 2021-03-31 LAB — GLUCOSE, CAPILLARY
Glucose-Capillary: 108 mg/dL — ABNORMAL HIGH (ref 70–99)
Glucose-Capillary: 100 mg/dL — ABNORMAL HIGH (ref 70–99)
Glucose-Capillary: 92 mg/dL (ref 70–99)
Glucose-Capillary: 107 mg/dL — ABNORMAL HIGH (ref 70–99)

## 2021-03-31 NOTE — Progress Notes (Signed)
RT placed patient on BiPAP via a nasal mask and patient was only able to tolerate for 5 minutes.  RT removed mask and placed patient back on nasal cannula of 2 L.  RT explained to son that there are other options for mask outside of the hospital.  RN made aware.

## 2021-03-31 NOTE — Progress Notes (Signed)
PROGRESS NOTE   Subjective/Complaints:  Says he's still having issues with staff at night. Hasn't used bipap since last week. Denies any pain currently. Breathing is at baseline  ROS: Patient denies fever, rash, sore throat, blurred vision, nausea, vomiting, diarrhea, cough,   chest pain, joint or back pain, headache, or mood change.    Objective:   No results found. No results for input(s): WBC, HGB, HCT, PLT in the last 72 hours.    No results for input(s): NA, K, CL, CO2, GLUCOSE, BUN, CREATININE, CALCIUM in the last 72 hours.       Intake/Output Summary (Last 24 hours) at 03/31/2021 0914 Last data filed at 03/31/2021 0524 Gross per 24 hour  Intake 660 ml  Output 1025 ml  Net -365 ml        Physical Exam: Vital Signs Blood pressure (!) 165/70, pulse 97, temperature (!) 97.3 F (36.3 C), resp. rate 19, height 6\' 1"  (1.854 m), weight 117.6 kg, SpO2 97 %.     Constitutional: No distress . Vital signs reviewed. HEENT: NCAT, EOMI, oral membranes moist, O2 [place] Neck: supple Cardiovascular: RRR without murmur. No JVD    Respiratory/Chest: CTA Bilaterally without wheezes or rales. Normal effort    GI/Abdomen: BS +, non-tender, non-distended Ext: no clubbing, cyanosis, or edema Psych: pleasant and cooperative  Skin: left knee abrasion resolving Neuro:  more alert and on point. Functional memory and awareness. verbose   Mild left central VII, mild left hemi-facial sensory loss. LUE 2+ deltoid, 3/5 biceps, tricep,   3/5 wrist/ HI- . LLE:   1+ to 2 HF, KE- , 0-tr/5 distally-stable in appearance .  Left arm and leg 1/2 light touch. Still No resting tone.  Musculoskeletal: right shoulder pain with AROM   Assessment/Plan: 1. Functional deficits which require 3+ hours per day of interdisciplinary therapy in a comprehensive inpatient rehab setting. Physiatrist is providing close team supervision and 24 hour management of  active medical problems listed below. Physiatrist and rehab team continue to assess barriers to discharge/monitor patient progress toward functional and medical goals  Care Tool:  Bathing    Body parts bathed by patient: Chest, Abdomen, Front perineal area, Face, Left arm, Right upper leg, Left upper leg   Body parts bathed by helper: Right arm, Buttocks, Right lower leg, Left lower leg     Bathing assist Assist Level: Maximal Assistance - Patient 24 - 49%     Upper Body Dressing/Undressing Upper body dressing   What is the patient wearing?: Button up shirt    Upper body assist Assist Level: Moderate Assistance - Patient 50 - 74%    Lower Body Dressing/Undressing Lower body dressing      What is the patient wearing?: Underwear/pull up, Pants     Lower body assist Assist for lower body dressing: Maximal Assistance - Patient 25 - 49%     Toileting Toileting    Toileting assist Assist for toileting: Maximal Assistance - Patient 25 - 49%     Transfers Chair/bed transfer  Transfers assist  Chair/bed transfer activity did not occur: Safety/medical concerns  Chair/bed transfer assist level: 2 Helpers (MAX A +2 SB txfr)  Locomotion Ambulation   Ambulation assist   Ambulation activity did not occur: Safety/medical concerns  Assist level: Moderate Assistance - Patient 50 - 74% Assistive device: Parallel bars Max distance: 8 ft   Walk 10 feet activity   Assist  Walk 10 feet activity did not occur: Safety/medical concerns        Walk 50 feet activity   Assist Walk 50 feet with 2 turns activity did not occur: Safety/medical concerns         Walk 150 feet activity   Assist Walk 150 feet activity did not occur: Safety/medical concerns         Walk 10 feet on uneven surface  activity   Assist Walk 10 feet on uneven surfaces activity did not occur: Safety/medical concerns         Wheelchair     Assist Is the patient using a  wheelchair?: Yes Type of Wheelchair: Manual Wheelchair activity did not occur: Safety/medical concerns         Wheelchair 50 feet with 2 turns activity    Assist    Wheelchair 50 feet with 2 turns activity did not occur: Safety/medical concerns       Wheelchair 150 feet activity     Assist  Wheelchair 150 feet activity did not occur: Safety/medical concerns       Blood pressure (!) 165/70, pulse 97, temperature (!) 97.3 F (36.3 C), resp. rate 19, height 6\' 1"  (1.854 m), weight 117.6 kg, SpO2 97 %.  Medical Problem List and Plan: 1. 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 Has made some gains with therapies this past week 2. Impaired mobility: continue Lovenox             -antiplatelet therapy: Aspirin 325 mg daily and Plavix 75 mg daily x3 months then aspirin alone 3. R shoulder pain from fall -continue Neurontin 300 mg 3 times daily, off oxycodone d/t "loopiness" -ROM, strengthening of R shoulder with therapies 4. Anxiety  Pt needs regular positive reassurance, anxious at times. Wonder if that is some of the problem at night  -may have to back off of xanax d/t lethargy             -antipsychotic agents: N/A 5. Neuropsych: This patient is capable of making decisions on his own behalf. 6. Skin/Wound Care: local care as needed 7. Fluids/Electrolytes/Nutrition: encourage PO    8.  Dysphagia.  a advanced to regular thins. SLP signed off  -take time, small sips 9.  Hypertension.  Norvasc 10 mg daily Avapro 75 mg daily, Toprol-XL 25 mg daily    Vitals:   03/30/21 2008 03/31/21 0323  BP: (!) 142/75 (!) 165/70  Pulse: 90 97  Resp: 18 19  Temp: 97.8 F (36.6 C) (!) 97.3 F (36.3 C)  SpO2:  98% 97%   1/9 fair control 10.  Diabetes mellitus.  Hemoglobin A1c 9.9.  SSI, Tradjenta 5 mg daily, Glucophage 1000 mg twice daily.   CBG (last 3)  Recent Labs    03/30/21 1642 03/30/21 2051 03/31/21 0539  GLUCAP 108* 102* 108*  1/9- Good Bg control-  con't regimen 11.  Hyperlipidemia.  Lipitor 12.  Tobacco use.  Counseling 13.  Seizure prophylaxis.  Continue Trileptal 300 mg 3 times daily 14. Constipation-  -moving bowels-continue senna-s at HS as mtc 15. Hypercarbia/hypoxia, likely OSA  -  PCCM consulted. They have signed off -  lowered pressure bipap, RT working on mask -OOB, IS, FV -continue oxygen prn O2 sat <89% -needs outpt sleep study and pulmonary f/u -resources limited d/t uninsured status -  Albuterol nebs for wheezing- prn- q6 hours prn.  -pleading with patient to work on using bipap at night so that he can become accustomed to it  16.  Epistaxis mild likely related to O2 nasal canula-- -improved. -continue sprays, vasline to keep nasal mucosa moist.  -flonase for congestion     LOS: 32 days A FACE TO FACE EVALUATION WAS PERFORMED  Ranelle Oyster 03/31/2021, 9:14 AM

## 2021-03-31 NOTE — Progress Notes (Signed)
Occupational Therapy Session Note ° °Patient Details  °Name: Brent Young °MRN: [id number] °Date of Birth: 10/15/1960 ° °Today's Date: 03/31/2021 °Session 1 °OT Individual Time: 0735-0845 °OT Individual Time Calculation (min): 70 min  ° °Session 2 °OT Individual Time: 1300-1400 °OT Individual Time Calculation (min): 60 min  ° ° °Short Term Goals: °Week 5:  OT Short Term Goal 1 (Week 5): LTG=STG 2.2 ELOS ° °Skilled Therapeutic Interventions/Progress Updates:  °  Session 1 °Pt greeted seated in TIS wc finishing breakfast and agreeable to OT treatment session. Pt reported he wanted to get up earlier than therapy to eat. Pt on 2L of O2 throughout session. Pt reported he had a rough night because he kept wanting to get up out of bed, but patients son there to tell him not to. Pt continues to show very poor awareness of deficits stating he thought he could get up easily from the floor if he got out of bed himself. OT continued to educate on falls prevention and HIGH fall risk. Pt reported need to try to have a BM. Sit<>stand on [place] with min A, then transferred into bathroom to transfer onto BSC over toilet. Pt uriunated all over that bathroom as he was not pointing downinto commode with no BM. Stedy transfer over to wide BSC in shower with min A. Bathing completed with pt able to grasp wash cloth with L hand and wash underarms. Using BSC cut-out, pt also able to help with peri-area and all of upper body and upper legs. Worked on dressing techniques seated in TIS wc with improved recall of hemi-techniques requiring on minimal cues and min A for UB dressing. OT assist for LB dressing due to time management, but MUCH improved sit<>stands in [place] with improved midline orientation in standing. Pt left seated in TIS wc at end of session with alarm belt on, call bell in reach, and needs met.  ° °Session 2 °Pt greeted seated in TIS wc and agreeable to OT treatment session. Pt brought to therapy gym and addressed sit<>stands,  standing balance/endurance, and L UE NMR. OT facilitated weight bearing through L UE in standing while R UE placed pegs for pegboard puzzle. Pt needed 3 rest breaks to complete moderate diffiulcty puzzle with moderate assistance for standing balance. Facilitation for full hip/knee and trunk extension while standing. L UE fine motor control with graded peg board task using large pegs to complete a "t"shape puzzle. Pt with more difficulty getting pegs on R side of board as pt with little wrist extension, requiring OT facilitation to achieve wrist extension and encourage normal movement patterns. Pt returned to room and left seated in TIS wc with alarm belt on, call bell in reach, and needs met.  ° °Therapy Documentation °Precautions:  °Precautions °Precautions: Fall °Precaution Comments: BP goal 130-160 per neuro MD; left side weakness, more impairment noted in the LE vs UE °Other Brace: AFO for LLE °Restrictions °Weight Bearing Restrictions: No °Pain: °Pain Assessment °Pain Score: 0-No pain ° ° °Therapy/Group: Individual Therapy ° ° S  °03/31/2021, 2:23 PM °

## 2021-03-31 NOTE — Progress Notes (Signed)
Patient ID: Brent Young, male   DOB: 1961-01-23, 61 y.o.   MRN: [id number]  SW working on [place] for patient to [facility].   SW met with pt in room to inform on above. He asked SW to wait to speak with his son Brent Young since he was at [employer] trying to address a few things for  him.   SW will follow-up tomorrow after team conference.   [name], MSW, [place] Office: [phone] Cell: [phone] Fax: [phone]

## 2021-03-31 NOTE — Progress Notes (Signed)
Physical Therapy Weekly Progress Note  Patient Details  Name: Brent Young MRN: [id number] Date of Birth: 28-Apr-1960  Beginning of progress report period: March 21, 2021 End of progress report period: March 31, 2021  Today's Date: 03/31/2021 PT Individual Time: 1002-1059 PT Individual Time Calculation (min): 57 min   Patient has met 0 of 0 short term goals due to expected length of stay extending secondary to issues with discharge disposition. Pt has continued to make slow progress toward mobility goals, but has demonstrated marked improvement in efficiency and independence with sit to stand transfers, especially with use of [place], and has initiated regular gait training. Pt has has increasing L hemibody NM return and associated improvements in standing balance. Pt has ambulated up to 10' with maxA +2 and RW. Pt has also had several family education sessions with son present and will continue to have ongoing education prior to discharge.  Patient continues to demonstrate the following deficits muscle weakness, decreased cardiorespiratoy endurance and decreased oxygen support, decreased coordination and decreased motor planning, decreased attention to left, and decreased sitting balance, decreased standing balance, decreased postural control, hemiplegia, and decreased balance strategies and therefore will continue to benefit from skilled PT intervention to increase functional independence with mobility.  Patient progressing toward long term goals..  Continue plan of care.  PT Short Term Goals Week 5:  PT Short Term Goal 1 (Week 5): STGs = LTGs  Skilled Therapeutic Interventions/Progress Updates:  Ambulation/gait training;Community reintegration;DME/adaptive equipment instruction;Neuromuscular re-education;Psychosocial support;Stair training;UE/LE Strength taining/ROM;Wheelchair propulsion/positioning;Balance/vestibular training;Discharge planning;Functional electrical stimulation;Pain  management;Therapeutic Activities;UE/LE Coordination activities;Disease management/prevention;Functional mobility training;Patient/family education;Splinting/orthotics;Therapeutic Exercise   Pt received standing in [place] with RN staff present. Handoff to PT with report that pt needs to use restroom. Pt reports pain in R shoulder. RN notified and PT provides rest breaks as needed to manage pain. TotalA transfer to toilet via [place]. Pt stands form toilet following bowel movement with minA and cues for initiation and sequencing. Pt has to stand several times and remains standing for 3-4 minutes to perform pericare. Following, pt transfers to [facility] with St[place]. WC transport to gym for time management. PT and tech assist pt with donning bilateral TED hose and shoes. Remainder of session focused on gait training with RW and L hand splint. Pt performs multiple reps of sit to stand with modA +2 and RW. Pt ambulates 3x10'  with modA/maxA +2. PT provides step-by-step cueing for weight shifting, L lower extremity extensor muscle group engagement, maintaining neutral midline posture, utilizing reciprocal gait pattern, and manually assisting with progression of L leg during swing phase. WC transport back to room. Left seated with alarm intact and all needs within reach.  Therapy Documentation Precautions:  Precautions Precautions: Fall Precaution Comments: BP goal 130-160 per neuro MD; left side weakness, more impairment noted in the LE vs UE Other Brace: AFO for LLE Restrictions Weight Bearing Restrictions: No    Therapy/Group: Individual Therapy  Breck Coons, PT, DPT 03/31/2021, 1:26 PM

## 2021-04-01 LAB — CBC
HCT: 30.5 % — ABNORMAL LOW (ref 39.0–52.0)
Hemoglobin: 9.8 g/dL — ABNORMAL LOW (ref 13.0–17.0)
MCH: 30.4 pg (ref 26.0–34.0)
MCHC: 32.1 g/dL (ref 30.0–36.0)
MCV: 94.7 fL (ref 80.0–100.0)
Platelets: 327 10*3/uL (ref 150–400)
RBC: 3.22 MIL/uL — ABNORMAL LOW (ref 4.22–5.81)
RDW: 16 % — ABNORMAL HIGH (ref 11.5–15.5)
WBC: 6.8 10*3/uL (ref 4.0–10.5)
nRBC: 0 % (ref 0.0–0.2)

## 2021-04-01 LAB — BASIC METABOLIC PANEL
Anion gap: 9 (ref 5–15)
BUN: 8 mg/dL (ref 8–23)
CO2: 32 mmol/L (ref 22–32)
Calcium: 8.7 mg/dL — ABNORMAL LOW (ref 8.9–10.3)
Chloride: 101 mmol/L (ref 98–111)
Creatinine, Ser: 0.57 mg/dL — ABNORMAL LOW (ref 0.61–1.24)
GFR, Estimated: >60 mL/min (ref >60)
Glucose, Bld: 98 mg/dL (ref 70–99)
Potassium: 3.4 mmol/L — ABNORMAL LOW (ref 3.5–5.1)
Sodium: 142 mmol/L (ref 135–145)

## 2021-04-01 LAB — GLUCOSE, CAPILLARY
Glucose-Capillary: 97 mg/dL (ref 70–99)
Glucose-Capillary: 95 mg/dL (ref 70–99)
Glucose-Capillary: 77 mg/dL (ref 70–99)
Glucose-Capillary: 109 mg/dL — ABNORMAL HIGH (ref 70–99)

## 2021-04-01 LAB — SARS CORONAVIRUS 2 (TAT 6-24 HRS): SARS Coronavirus 2: NEGATIVE

## 2021-04-01 MED ORDER — METOPROLOL SUCCINATE ER 25 MG PO TB24: 25.0000 mg | ORAL_TABLET | Freq: Every day | ORAL | Status: DC

## 2021-04-01 MED ORDER — CLOPIDOGREL BISULFATE 75 MG PO TABS: 75.0000 mg | ORAL_TABLET | Freq: Every day | ORAL | Status: DC

## 2021-04-01 MED ORDER — FLUTICASONE PROPIONATE 50 MCG/ACT NA SUSP: 2.0000 | Freq: Every day | NASAL | 2 refills | Status: DC

## 2021-04-01 MED ORDER — MOMETASONE FURO-FORMOTEROL FUM 100-5 MCG/ACT IN AERO
2.0000 | INHALATION_SPRAY | Freq: Two times a day (BID) | RESPIRATORY_TRACT | Status: DC
Start: 2021-04-01 — End: 2021-11-06

## 2021-04-01 MED ORDER — IRBESARTAN 75 MG PO TABS: 75.0000 mg | ORAL_TABLET | Freq: Every day | ORAL | Status: DC

## 2021-04-01 MED ORDER — PANTOPRAZOLE SODIUM 40 MG PO TBEC: 40.0000 mg | DELAYED_RELEASE_TABLET | Freq: Every day | ORAL | Status: DC

## 2021-04-01 MED ORDER — ASPIRIN 325 MG PO TBEC: 325.0000 mg | DELAYED_RELEASE_TABLET | Freq: Every day | ORAL | 0 refills | Status: DC

## 2021-04-01 MED ORDER — HYDROXYZINE HCL 10 MG PO TABS: 10.0000 mg | ORAL_TABLET | Freq: Three times a day (TID) | ORAL | 0 refills | Status: DC | PRN

## 2021-04-01 MED ORDER — LINAGLIPTIN 5 MG PO TABS: 5.0000 mg | ORAL_TABLET | Freq: Every day | ORAL | Status: DC

## 2021-04-01 MED ORDER — ATORVASTATIN CALCIUM 40 MG PO TABS: 40.0000 mg | ORAL_TABLET | Freq: Every day | ORAL | Status: DC

## 2021-04-01 MED ORDER — ALBUTEROL SULFATE (2.5 MG/3ML) 0.083% IN NEBU: 2.5000 mg | INHALATION_SOLUTION | Freq: Four times a day (QID) | RESPIRATORY_TRACT | 12 refills | Status: DC | PRN

## 2021-04-01 NOTE — Progress Notes (Signed)
Occupational Therapy Discharge Summary  Patient Details  Name: Brent Young MRN: [id number] Date of Birth: [birth year]  Today's Date: 04/01/2021 OT Individual Time: 1103-1203 OT Individual Time Calculation (min): 60 min   Pt greeted seated in TIS wc and agreeable to OT treatment session. Bathing/dressing tasks completed from wc at the sink with mod A LB bathing, max A LB dressing, then min A for UB min A for UB ADLs. Sit<>stands in [place] with min A, but mod A sit<>stands without Stedy at the sink due to L knee weakness and 1 L knee buckle. L UE functional improving to gross assist level See navigator below for further details on BADL performance. Pt left seated in TIS wc at end of session with alarm belt on, call bell in reach, and needs met.    Patient has met 11 of 11 long term goals due to improved activity tolerance, improved balance, postural control, ability to compensate for deficits, functional use of  LEFT upper and LEFT lower extremity, improved attention, and improved coordination.  Patient to discharge at overall Mod Assist/max A level.  Patient's care partner unavailable to provide the necessary physical assistance at discharge. Patient to dc to SNF for continued OT in next venue of care.  Reasons goals not met: n/a  Recommendation:  Patient will benefit from ongoing skilled OT services in skilled nursing facility setting to continue to advance functional skills in the area of BADL, Reduce care partner burden, and functional use of L UE .  Equipment: N/a due to dc to SNF  Reasons for discharge: treatment goals met and discharge from hospital  Patient/family agrees with progress made and goals achieved: Yes  OT Discharge Precautions/Restrictions  Precautions Precautions: Fall Restrictions Weight Bearing Restrictions: No Pain Pain Assessment Pain Scale: 0-10 Pain Score: 4  Pain Type: Acute pain Pain Location: Shoulder Pain Orientation: Right Pain Descriptors /  Indicators: Aching Pain Frequency: Intermittent Pain Intervention(s): Repositioned ADL ADL Eating: Set up Grooming: Setup Upper Body Bathing: Minimal assistance Lower Body Bathing: Moderate assistance Upper Body Dressing: Minimal assistance Lower Body Dressing: Maximal assistance Toileting: Maximal assistance Toilet Transfer: Maximal assistance Perception  Perception: Impaired Inattention/Neglect: Does not attend to left side of body (mild, much improved since eval) Praxis Praxis: Intact Cognition Overall Cognitive Status: Within Functional Limits for tasks assessed Arousal/Alertness: Awake/alert Orientation Level: Oriented X4 Year: 2023 Month: July (able to recover to January with cues) Day of Week: Correct Attention: Selective Selective Attention: Impaired Immediate Memory Recall: Blue;Sock;Bed Memory Recall Sock: Not able to recall Memory Recall Blue: Without Cue Memory Recall Bed: Without Cue Safety/Judgment: Impaired Comments: Very poor safety awareness, awareness and confusion worsened over rehab stay due to CO2 retention and refusal of BiPap Sensation Sensation Light Touch: Impaired by gross assessment Hot/Cold: Appears Intact Proprioception: Impaired Detail Proprioception Impaired Details: Impaired LLE (improved since eval) Coordination Gross Motor Movements are Fluid and Coordinated: No Fine Motor Movements are Fluid and Coordinated: No Coordination and Movement Description: L hemiplegia, improved smoothness accuracy with L hand, but able to use more functionally Finger Nose Finger Test: Able to perform now, but with weakness and ataxi Motor  Motor Motor: Hemiplegia;Abnormal tone Motor - Discharge Observations: L hemiplegia continues, L LE weaker than L UE- much improved functional use of L UE Mobility  Transfers Sit to Stand: Moderate Assistance - Patient 50-74% Stand to Sit: Moderate Assistance - Patient 50-74%  Balance Balance Balance Assessed:  Yes Static Sitting Balance Static Sitting - Balance Support: Feet supported Static Sitting -  Level of Assistance: 5: Stand by assistance Dynamic Sitting Balance Dynamic Sitting - Balance Support: Feet supported Dynamic Sitting - Level of Assistance: 5: Stand by assistance Static Standing Balance Static Standing - Balance Support: During functional activity Static Standing - Level of Assistance: 4: Min assist;3: Mod assist Dynamic Standing Balance Dynamic Standing - Balance Support: During functional activity Dynamic Standing - Level of Assistance: 3: Mod assist;2: Max assist Extremity/Trunk Assessment RUE Assessment RUE Assessment: Within Functional Limits LUE Assessment Passive Range of Motion (PROM) Comments: WFL General Strength Comments: Much improved L UE function. FF 0-90, elbow 0-60, shoulder abduction 110 Brunstrum levels for arm and hand: Arm;Hand Brunstrum level for arm: Stage III Synergy is performed voluntarily Brunstrum level for hand: Stage IV Movements deviating from synergies   Daneen Schick Crytal Pensinger 04/01/2021, 11:58 AM

## 2021-04-01 NOTE — Discharge Summary (Addendum)
Physician Discharge Summary  Patient ID: Brent Young MRN: [id number] DOB/AGE: 1961-01-22 61 y.o.  Admit date: 02/27/2021 Discharge date: 04/03/2021  Discharge Diagnoses:  Principal Problem:   Infarction of right basal ganglia (HCC) Hypertension Diabetes mellitus Hyperlipidemia Tobacco use Seizure prophylaxis Constipation Hypercarbic hypoxia/likely OSA Epistaxis  Discharged Condition: Stable  Significant Diagnostic Studies: DG Chest 2 View  Result Date: 03/18/2021 CLINICAL DATA:  61 year old male with stroke EXAM: CHEST - 2 VIEW COMPARISON:  03/11/2021 FINDINGS: Cardiomediastinal silhouette unchanged in size and contour with cardiomegaly. Similar appearance of mixed interstitial and airspace disease of the bilateral lungs, with no new region of confluent airspace disease. No large pleural effusion. No pneumothorax. Fullness in the central vasculature. No displaced fracture.  Degenerative changes of the spine IMPRESSION: Persistently low lung volumes, with similar distribution of mixed interstitial and airspace disease bilaterally, most compatible with atelectasis/consolidation. Electronically Signed   By: Gilmer Mor D.O.   On: 03/18/2021 13:13   DG Chest 2 View  Result Date: 03/11/2021 CLINICAL DATA:  Hypoxia EXAM: CHEST - 2 VIEW COMPARISON:  02/27/2021 FINDINGS: Cardiac shadow is enlarged but stable. Lungs are well aerated bilaterally. Chronic linear densities are noted in the bases bilaterally left greater than right similar to that seen on the prior exam. No new focal abnormality is noted. IMPRESSION: No significant change from the prior study. Electronically Signed   By: Alcide Clever M.D.   On: 03/11/2021 17:44   CT HEAD WO CONTRAST ( )  Result Date: 03/19/2021 CLINICAL DATA:  Chronic headache EXAM: CT HEAD WITHOUT CONTRAST TECHNIQUE: Contiguous axial images were obtained from the base of the skull through the vertex without intravenous contrast. COMPARISON:  02/16/2021  FINDINGS: Brain: There is no mass, hemorrhage or extra-axial collection. There is generalized atrophy without lobar predilection. Hypodensity of the white matter is most commonly associated with chronic microvascular disease. There is an old infarct of the posterior right basal ganglia. Vascular: Atherosclerotic calcification of the vertebral and internal carotid arteries at the skull base. No abnormal hyperdensity of the major intracranial arteries or dural venous sinuses. Skull: The visualized skull base, calvarium and extracranial soft tissues are normal. Sinuses/Orbits: No fluid levels or advanced mucosal thickening of the visualized paranasal sinuses. No mastoid or middle ear effusion. The orbits are normal. IMPRESSION: 1. No acute intracranial abnormality. 2. Old right basal ganglia infarct and findings of chronic microvascular ischemia. Electronically Signed   By: Deatra Robinson M.D.   On: 03/19/2021 22:09   DG CHEST PORT 1 VIEW  Result Date: 03/25/2021 CLINICAL DATA:  Hypoxia, shortness of breath EXAM: PORTABLE CHEST 1 VIEW COMPARISON:  Chest radiograph 03/18/2021 FINDINGS: The heart is enlarged, unchanged. The mediastinal contours are stable. Linear opacities in both mid lungs are similar to prior studies and are favored to reflect scarring. Previously seen patchy opacities in the medial right base have improved. There is no new or worsening focal airspace disease. There is no significant pleural effusion. There is no pneumothorax. There is no acute osseous abnormality. IMPRESSION: Previously seen opacities in the medial right base have improved. Additional linear opacities in both lungs are unchanged and are favored to reflect scarring. No new or worsening focal airspace disease. Electronically Signed   By: Lesia Hausen M.D.   On: 03/25/2021 14:38   DG Knee Complete 4 Views Left  Result Date: 03/12/2021 CLINICAL DATA:  Patient fell getting into wheelchair today, pain in left knee. Patient had to be  held for images due to stroke. EXAM: LEFT KNEE -  COMPLETE 4+ VIEW COMPARISON:  X-ray left knee 02/16/2021 FINDINGS: No evidence of fracture, dislocation, or joint effusion. No evidence of severe arthropathy. No aggressive appearing focal bone abnormality. Soft tissues are unremarkable. Vascular calcifications. IMPRESSION: No acute displaced fracture or dislocation. Electronically Signed   By: Tish FredericksonMorgane  Naveau M.D.   On: 03/12/2021 18:56   DG Swallowing Func-Speech Pathology  Result Date: 03/10/2021 Table formatting from the original result was not included. Objective Swallowing Evaluation: Type of Study: MBS-Modified Barium Swallow Study  Patient Details Name: Brent Young MRN: [id number] Date of Birth: 01-Jul-1960 Today's Date: 03/10/2021 Past Medical History: Past Medical History: Diagnosis Date  Allergy   Arthritis   Diabetes mellitus without complication (HCC)   Hypertension   Stroke [facility](HCC)  Past Surgical History: Past Surgical History: Procedure Laterality Date  FRACTURE SURGERY Left   Patient fractured left arm  IR ANGIO INTRA EXTRACRAN SEL COM CAROTID INNOMINATE UNI L MOD SED  02/19/2021  IR ANGIO INTRA EXTRACRAN SEL INTERNAL CAROTID UNI R MOD SED  02/19/2021  IR ANGIO VERTEBRAL SEL SUBCLAVIAN INNOMINATE UNI R MOD SED  02/19/2021  IR ANGIO VERTEBRAL SEL VERTEBRAL UNI L MOD SED  02/19/2021  IR US GUIDE VASC ACCESS RIGHT  02/19/2021 HPI: See H&P  Subjective: Pt seated upright in bed. No family present  Recommendations for follow up therapy are one component of a multi-disciplinary discharge planning process, led by the attending physician.  Recommendations may be updated based on patient status, additional functional criteria and insurance authorization. Assessment / Plan / Recommendation Clinical Impressions 03/10/2021 Clinical Impression Patient demonstrates a mild oropharyngeal characterized by decreased bolus cohesion with premature spillage to the pyriform sinuses with thin liquids resulting in  intermittent flash penetration. No aspiration noted despite large, sequential sips. Recommend patient upgrade to regular textures with thin liquids. SLP Visit Diagnosis Dysphagia, oropharyngeal phase (R13.12) Attention and concentration deficit following -- Frontal lobe and executive function deficit following -- Impact on safety and function Mild aspiration risk   Treatment Recommendations 03/10/2021 Treatment Recommendations Therapy as outlined in treatment plan below   Prognosis 03/10/2021 Prognosis for Safe Diet Advancement Good Barriers to Reach Goals -- Barriers/Prognosis Comment -- Diet Recommendations 03/10/2021 SLP Diet Recommendations Regular solids;Thin liquid Liquid Administration via Cup Medication Administration Whole meds with puree Compensations Slow rate;Small sips/bites;Minimize environmental distractions Postural Changes --   Other Recommendations 03/10/2021 Recommended Consults -- Oral Care Recommendations Oral care BID Other Recommendations -- Follow Up Recommendations No SLP follow up Assistance recommended at discharge None Functional Status Assessment -- Frequency and Duration  03/10/2021 Speech Therapy Frequency (ACUTE ONLY) min 3x week Treatment Duration 1 week   Oral Phase 03/10/2021 Oral Phase Impaired Oral - Pudding Teaspoon -- Oral - Pudding Cup -- Oral - Honey Teaspoon -- Oral - Honey Cup -- Oral - Nectar Teaspoon -- Oral - Nectar Cup -- Oral - Nectar Straw -- Oral - Thin Teaspoon -- Oral - Thin Cup Decreased bolus cohesion;Premature spillage Oral - Thin Straw Decreased bolus cohesion;Premature spillage Oral - Puree -- Oral - Mech Soft -- Oral - Regular WFL Oral - Multi-Consistency -- Oral - Pill -- Oral Phase - Comment --  Pharyngeal Phase 03/10/2021 Pharyngeal Phase Impaired Pharyngeal- Pudding Teaspoon -- Pharyngeal -- Pharyngeal- Pudding Cup -- Pharyngeal -- Pharyngeal- Honey Teaspoon -- Pharyngeal -- Pharyngeal- Honey Cup -- Pharyngeal -- Pharyngeal- Nectar Teaspoon -- Pharyngeal  -- Pharyngeal- Nectar Cup -- Pharyngeal -- Pharyngeal- Nectar Straw -- Pharyngeal -- Pharyngeal- Thin Teaspoon -- Pharyngeal -- Pharyngeal- Thin Cup Delayed  swallow initiation-vallecula;Penetration/Aspiration during swallow Pharyngeal Material enters airway, remains ABOVE vocal cords then ejected out Pharyngeal- Thin Straw Delayed swallow initiation-vallecula;Penetration/Aspiration during swallow Pharyngeal Material enters airway, remains ABOVE vocal cords then ejected out Pharyngeal- Puree -- Pharyngeal -- Pharyngeal- Mechanical Soft -- Pharyngeal -- Pharyngeal- Regular Delayed swallow initiation-vallecula Pharyngeal -- Pharyngeal- Multi-consistency -- Pharyngeal -- Pharyngeal- Pill -- Pharyngeal -- Pharyngeal Comment --  No flowsheet data found. PAYNE, COURTNEY 03/10/2021, 12:14 PM    Feliberto Gottron, MA, CCC-SLP                   Labs:  Basic Metabolic Panel: Recent Labs  Lab 03/26/21 1034 03/27/21 0524 04/01/21 0525  NA 140  --  142  K 3.9  --  3.4*  CL 99  --  101  CO2 33*  --  32  GLUCOSE 131*  --  98  BUN 13  --  8  CREATININE 0.66 0.71 0.57*  CALCIUM 9.1  --  8.7*    CBC: Recent Labs  Lab 03/26/21 1034 04/01/21 0525  WBC 8.2 6.8  NEUTROABS 5.0  --   HGB 9.8* 9.8*  HCT 29.6* 30.5*  MCV 94.0 94.7  PLT 255 327    CBG: Recent Labs  Lab 03/31/21 0539 03/31/21 1200 03/31/21 1703 03/31/21 2047 04/01/21 0536  GLUCAP 108* 100* 92 107* 97   Family history.  Maternal grandmother with colon cancer.  Negative esophageal stomach or rectal cancer  Brief HPI:   Brent Young is a 61 y.o. right-handed male with history of diabetes mellitus hypertension hyperlipidemia lacunar infarcts, ICH 2021, tobacco use.  Per chart review lives with spouse.  Presented 02/16/2021 with acute onset of left-sided weakness he did roll out of bed without loss of conscious.  Admission chemistries unremarkable except potassium 3.1 troponin 37-55, CK 574.  Cranial CT scan negative.  CT angiogram of  head and neck poor opacification of right vertebral artery throughout its course with increased focal stenosis just distal to its takeoff.  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  Patient did not receive tPA.  MRI showed acute to subacute perforator infarct of the right basal ganglia and corona radiata.  Cerebral angiogram completed showing left ICA 65 to 70% stenosis, bilateral ICA siphon mild to moderate stenosis right VA stenosis.  Placed on aspirin 325 mg daily and Plavix 75 mg daily for 3 months then aspirin alone.  Echocardiogram with ejection fraction of 60 to 65% no wall motion abnormalities.  Elevated troponin felt to be secondary to demand ischemia.  Lovenox added for DVT prophylaxis.  Presently on a dysphagia #2 thin liquid diet.  Persistent right shoulder pain x-rays without fracture or dislocation consistent with mild AC joint arthritis with supportive care.  Therapy evaluations completed due to patient decreased left-sided weakness functional mobility deficits was admitted for a comprehensive rehab program.   Hospital Course: Brent Young was admitted to rehab 02/27/2021 for inpatient therapies to consist of PT, ST and OT at least three hours five days a week. Past admission physiatrist, therapy team and rehab RN have worked together to provide customized collaborative inpatient rehab.  Pertaining to patient's right basal ganglia corona radiata infarct secondary small vessel disease history of lacunar infarct remained stable plan was for aspirin 325 mg daily and Plavix 75 mg  day x3 months and aspirin alone with follow-up per neurology services.  Patient did have some right shoulder pain x-rays negative Neurontin as advised he was weaned off of oxycodone.  Blood pressure controlled on Norvasc as well as Toprol no  orthostasis.  His diet was advanced to regular follow-up per speech therapy.  Blood sugars controlled hemoglobin A1c 9.9 continued on Tradjenta 5 mg daily as well as Glucophage 1000 mg twice daily with diabetic teaching.  Lipitor for hyperlipidemia.  Patient did have a history of tobacco abuse receiving counseling regards to cessation of nicotine products.  Seizure prophylaxis maintained on Trileptal 300 mg 3 times daily no seizure activity.  Follow-up neuropsychology during hospital stay for emotional reaction to long complicated hospital course.  Hospital course follow-up per critical care medicine for hypercarbic hypoxia likely OSA continued oxygen therapy as needed for saturation less than 89% plan outpatient sleep study and pulmonary follow-up.  Patient did have 1 episode epistaxis felt to be related oxygen nasal cannula improved.   Blood pressures were monitored on TID basis and controlled  Diabetes has been monitored with ac/hs CBG checks and SSI was use prn for tighter BS control.    Rehab course: During patient's stay in rehab weekly team conferences were held to monitor patient's progress, set goals and discuss barriers to discharge. At admission, patient required max assist sit to stand max assist supine to sit  Physical exam.  Blood pressure 160/80 pulse 90 temperature 98 respiration 20 oxygen saturation 90% room air Constitutional.  No acute distress. HEENT.  Mild left facial droop Eyes.  Pupils round and reactive to light no discharge without nystagmus Neck.  Supple nontender no JVD without thyromegaly Cardiac regular rate and rhythm any extra sounds or murmur heard Abdomen.  Soft nontender positive bowel sounds without rebound Respiratory effort normal no respiratory distress without wheeze Musculoskeletal.  Normal range of motion Comments.  Right upper right lower extremity 5/5 Left upper extremity 2/5 proximal to minus/5 distally Left lower extremity 0/5 in all muscles  tested Neurologic.  Alert makes eye contact with examiner follows commands provides name and age he is dysarthric.  Mild decrease in light touch on left side.  He/She  has had improvement in activity tolerance, balance, postural control as well as ability to compensate for deficits. He/She has had improvement in functional use RUE/LUE  and RLE/LLE as well as improvement in awareness.  Patient with slow progressive gains demonstrating marked improvement in efficiency and independence with sit to stand transfers especially with the use of stedy and has initiated a regular gait training.  Ambulates 10 feet max assist with rolling walker.  Patient did show some decrease awareness of deficits needing assistance for safety sit to stand unsteady with minimal assist.  Worked on dressing techniques seated with improved recall of Hemi techniques required minimal cues and minimal assist for upper body dressing.  OT assist for lower body dressing due to time management.  Due to patient's physical needs recommendations were for skilled nursing facility.       Disposition: Discharge to skilled nursing facility    Diet: Diabetic diet  Special Instructions: No driving smoking or alcohol  Follow-up critical care pulmonary services for sleep study to evaluate OSA [phone]  2 L oxygen nasal cannula keep saturations greater than 89%  Medications at discharge 1.  Tylenol as needed 2.  Norvasc 10 mg p.o. daily 3.  Aspirin 325 mg p.o. daily 4.  Lipitor 40 mg p.o. daily 5.  Plavix  and 5 mg p.o. daily until 05/07/2021 and stop 6.  Voltaren gel 2 g 4 times daily 7.  Flonase 2 sprays each nostril daily 8.  Neurontin 300 mg p.o. 3 times daily 9.  Atarax 10 mg p.o. 3 times daily as needed anxiety 10.  Avapro 75 mg p.o. daily 11.  Tradjenta 5 mg p.o. daily 12.  Glucophage 1000 mg p.o. twice daily 13.  Toprol-XL 25 mg p.o. daily 14.  Dulera 2 puffs twice daily 15.  Trileptal 300 mg p.o. 3 times daily 16.   Protonix 40 mg p.o. daily 17.  Albuterol nebulizer treatment 2.5 mg every 6 hours as needed wheezing   30-35 minutes were spent completing discharge summary and discharge planning  Discharge Instructions     Ambulatory referral to Neurology   Complete by: As directed    An appointment is requested in approximately: 4 weeks right basal ganglia infarction and needs sleep study for OSA/hypercarbia        Contact information for follow-up providers     Ranelle Oyster, MD Follow up.   Specialty: Physical Medicine and Rehabilitation Why: Office to call for appointment Contact information: [address] [place] [address] [phone]              Contact information for after-discharge care     Destination     [name] .   Service: Skilled Nursing Contact information: [address] [place] [address] [phone]                     Signed: Mcarthur Rossetti Shanti Agresti 04/01/2021, 10:50 AM

## 2021-04-01 NOTE — Progress Notes (Signed)
Pt has been incontinent of bladder overnight with multiple linen changes. Pt educated on trying to use a urinal when he gets the urge. Pt states, he is able to tell when he goes but he has not been calling out even after the incontinent episode.

## 2021-04-01 NOTE — Progress Notes (Signed)
Physical Therapy Session Note  Patient Details  Name: Brent Young MRN: [id number] Date of Birth: Oct 13, 1960  Today's Date: 04/01/2021 PT Individual Time: 0900-1015 PT Individual Time Calculation (min): 75 min   Short Term Goals: Week 5:  PT Short Term Goal 1 (Week 5): STGs = LTGs  Skilled Therapeutic Interventions/Progress Updates:    Pt received supine in bed, agreeable to PT session. Pt reports some pain in R shoulder at rest, premedicated prior to start of therapy session. Rolling L/R in bed with max A for donning of new brief and scrub pants. Supine to sit with max A for LE management and trunk elevation, HOB elevated and use of bedrail. Pt is able to maintain sitting balance EOB with close Supervision. Sit to stand with mod A to stedy from elevated bed. Stedy transfer to [facility] chair. Dependent transport via TIS to/from therapy gym. Sit to stand x 10 reps to RW with mod A, use of mirror in standing to maintain midline. Pt exhibits increased lean to the R and posteriorly in standing, max verbal/manual/visual cues for midline. Pt exhibits improved ability to maintain midline when LE placed in ideal position directly under patient prior to sit to stand transfer. Pt returned to room at end of session, left reclined in TIS chair in room with needs in reach, quick release belt and chair alarm in place. Pt on 2L via [place] throughout session, SpO2 initially at 84% at rest due to nasal cannula not fully in place in pt's nose. SpO2 at 92% once seated EOB, 96% with activity during session.  Therapy Documentation Precautions:  Precautions Precautions: Fall Precaution Comments: BP goal 130-160 per neuro MD; left side weakness, more impairment noted in the LE vs UE Other Brace: AFO for LLE Restrictions Weight Bearing Restrictions: No     Therapy/Group: Individual Therapy   Peter Congo, PT, DPT, CSRS  04/01/2021, 12:15 PM

## 2021-04-01 NOTE — Progress Notes (Signed)
Pt stated he will attempt CPAP again when he has a sleep study.

## 2021-04-01 NOTE — Plan of Care (Signed)
Problem: RH Balance Goal: LTG: Patient will maintain dynamic sitting balance (OT) Description: LTG:  Patient will maintain dynamic sitting balance with assistance during activities of daily living (OT) Outcome: Completed/Met Goal: LTG Patient will maintain dynamic standing with ADLs (OT) Description: LTG:  Patient will maintain dynamic standing balance with assist during activities of daily living (OT)  Outcome: Completed/Met   Problem: Sit to Stand Goal: LTG:  Patient will perform sit to stand in prep for activites of daily living with assistance level (OT) Description: LTG:  Patient will perform sit to stand in prep for activites of daily living with assistance level (OT) Outcome: Completed/Met   Problem: RH Eating Goal: LTG Patient will perform eating w/assist, cues/equip (OT) Description: LTG: Patient will perform eating with assist, with/without cues using equipment (OT) Outcome: Completed/Met   Problem: RH Grooming Goal: LTG Patient will perform grooming w/assist,cues/equip (OT) Description: LTG: Patient will perform grooming with assist, with/without cues using equipment (OT) Outcome: Completed/Met   Problem: RH Bathing Goal: LTG Patient will bathe all body parts with assist levels (OT) Description: LTG: Patient will bathe all body parts with assist levels (OT) Outcome: Completed/Met   Problem: RH Dressing Goal: LTG Patient will perform upper body dressing (OT) Description: LTG Patient will perform upper body dressing with assist, with/without cues (OT). Outcome: Completed/Met   Problem: RH Functional Use of Upper Extremity Goal: LTG Patient will use RT/LT upper extremity as a (OT) Description: LTG: Patient will use right/left upper extremity as a stabilizer/gross assist/diminished/nondominant/dominant level with assist, with/without cues during functional activity (OT) Outcome: Completed/Met   Problem: RH Toilet Transfers Goal: LTG Patient will perform toilet  transfers w/assist (OT) Description: LTG: Patient will perform toilet transfers with assist, with/without cues using equipment (OT) Outcome: Completed/Met   Problem: RH Attention Goal: LTG Patient will demonstrate this level of attention during functional activites (OT) Description: LTG:  Patient will demonstrate this level of attention during functional activites  (OT) Outcome: Completed/Met   Problem: RH Awareness Goal: LTG: Patient will demonstrate awareness during functional activites type of (OT) Description: LTG: Patient will demonstrate awareness during functional activites type of (OT) Outcome: Completed/Met

## 2021-04-01 NOTE — Progress Notes (Signed)
Patient ID: Brent Young, male   DOB: June 10, 1960, 61 y.o.   MRN: [id number]  SW received updates from [name] pt can be admitted. SW informed will aim for d/c tomorrow as pt requires COVID test. Waiting on updates about DME needs- tilt in space w/c or dump chair and bipap.   SW met with pt and pt son Brent Young to inform on efforts being made to transition patient tomorrow or Thursday. SW will confirm when he will leave.    [name], MSW, [place] Office: [phone] Cell: [phone] Fax: [phone]

## 2021-04-01 NOTE — Progress Notes (Signed)
PROGRESS NOTE   Subjective/Complaints:  Did ok last night. Tolerated "15 minutes" of bipap. Working on masks with respiratory. Happy that he was able to walk with PT yesterday  ROS: Patient denies fever, rash, sore throat, blurred vision, nausea, vomiting, diarrhea, cough, shortness of breath or chest pain, joint or back pain, headache, or mood change.    Objective:   No results found. Recent Labs    04/01/21 0525  WBC 6.8  HGB 9.8*  HCT 30.5*  PLT 327      Recent Labs    04/01/21 0525  NA 142  K 3.4*  CL 101  CO2 32  GLUCOSE 98  BUN 8  CREATININE 0.57*  CALCIUM 8.7*         Intake/Output Summary (Last 24 hours) at 04/01/2021 0957 Last data filed at 04/01/2021 0810 Gross per 24 hour  Intake 700 ml  Output 300 ml  Net 400 ml        Physical Exam: Vital Signs Blood pressure 136/64, pulse 88, temperature 98.5 F (36.9 C), resp. rate 20, height 6\' 1"  (1.854 m), weight 117.6 kg, SpO2 95 %.     Constitutional: No distress . Vital signs reviewed. obese HEENT: NCAT, EOMI, oral membranes moist. O2 [place] Neck: supple Cardiovascular: RRR without murmur. No JVD    Respiratory/Chest: CTA Bilaterally without wheezes or rales. Normal effort    GI/Abdomen: BS +, non-tender, non-distended Ext: no clubbing, cyanosis, or edema Psych: pleasant and cooperative  Skin: left knee abrasion resolving Neuro:  more alert and on point. Functional memory and awareness. verbose   Mild left central VII, mild left hemi-facial sensory loss. LUE 2+ deltoid, 3/5 biceps, tricep,   3/5 wrist/ HI- . Marland Kitchen. LLE:   1+ to 2 HF, KE- ,  tr/5 distally-not much change in appearance .  Left arm and leg 1/2 light touch. Still No resting tone.  Musculoskeletal: mild right shoulder pain with AROM   Assessment/Plan: 1. Functional deficits which require 3+ hours per day of interdisciplinary therapy in a comprehensive inpatient rehab  setting. Physiatrist is providing close team supervision and 24 hour management of active medical problems listed below. Physiatrist and rehab team continue to assess barriers to discharge/monitor patient progress toward functional and medical goals  Care Tool:  Bathing    Body parts bathed by patient: Chest, Abdomen, Front perineal area, Face, Left arm, Right upper leg, Left upper leg   Body parts bathed by helper: Right arm, Buttocks, Right lower leg, Left lower leg     Bathing assist Assist Level: Maximal Assistance - Patient 24 - 49%     Upper Body Dressing/Undressing Upper body dressing   What is the patient wearing?: Button up shirt    Upper body assist Assist Level: Moderate Assistance - Patient 50 - 74%    Lower Body Dressing/Undressing Lower body dressing      What is the patient wearing?: Underwear/pull up, Pants     Lower body assist Assist for lower body dressing: Maximal Assistance - Patient 25 - 49%     Toileting Toileting    Toileting assist Assist for toileting: Maximal Assistance - Patient 25 - 49%  Transfers Chair/bed transfer  Transfers assist  Chair/bed transfer activity did not occur: Safety/medical concerns  Chair/bed transfer assist level: 2 Helpers (MAX A +2 SB txfr)     Locomotion Ambulation   Ambulation assist   Ambulation activity did not occur: Safety/medical concerns  Assist level: Moderate Assistance - Patient 50 - 74% Assistive device: Parallel bars Max distance: 8 ft   Walk 10 feet activity   Assist  Walk 10 feet activity did not occur: Safety/medical concerns        Walk 50 feet activity   Assist Walk 50 feet with 2 turns activity did not occur: Safety/medical concerns         Walk 150 feet activity   Assist Walk 150 feet activity did not occur: Safety/medical concerns         Walk 10 feet on uneven surface  activity   Assist Walk 10 feet on uneven surfaces activity did not occur:  Safety/medical concerns         Wheelchair     Assist Is the patient using a wheelchair?: Yes Type of Wheelchair: Manual Wheelchair activity did not occur: Safety/medical concerns         Wheelchair 50 feet with 2 turns activity    Assist    Wheelchair 50 feet with 2 turns activity did not occur: Safety/medical concerns       Wheelchair 150 feet activity     Assist  Wheelchair 150 feet activity did not occur: Safety/medical concerns       Blood pressure 136/64, pulse 88, temperature 98.5 F (36.9 C), resp. rate 20, height 6\' 1"  (1.854 m), weight 117.6 kg, SpO2 95 %.  Medical Problem List and Plan: 1. 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 Interdisciplinary team conference today to discuss goals, barriers to discharge, and dc planning.  2. Impaired mobility: continue Lovenox             -antiplatelet therapy: Aspirin 325 mg daily and Plavix 75 mg daily x3 months then aspirin alone 3. R shoulder pain from fall -continue Neurontin 300 mg 3 times daily, off oxycodone d/t "loopiness" -ROM, strengthening of R shoulder with therapies 4. Anxiety  Pt needs regular positive reassurance, anxious at times. Wonder if that is some of the problem at night  -may have to back off of xanax d/t lethargy             -antipsychotic agents: N/A 5. Neuropsych: This patient is capable of making decisions on his own behalf. 6. Skin/Wound Care: local care as needed 7. Fluids/Electrolytes/Nutrition: encourage PO    8.  Dysphagia.  a advanced to regular thins. SLP signed off  -take time, small sips 9.  Hypertension.  Norvasc 10 mg daily Avapro 75 mg daily, Toprol-XL 25 mg daily    Vitals:   04/01/21  0426 04/01/21 0730  BP: 136/64   Pulse: 88   Resp: 20   Temp: 98.5 F (36.9 C)   SpO2: 94% 95%   1/10 fair control 10.  Diabetes mellitus.  Hemoglobin A1c 9.9.  SSI, Tradjenta 5 mg daily, Glucophage 1000 mg  twice daily.   CBG (last 3)  Recent Labs    03/31/21 1703 03/31/21 2047 04/01/21 0536  GLUCAP 92 107* 97  1/10- Good Bg control- con't regimen 11.  Hyperlipidemia.  Lipitor 12.  Tobacco use.  Counseling 13.  Seizure prophylaxis.  Continue Trileptal 300 mg 3 times daily 14. Constipation-  -moving bowels-continue senna-s at HS as mtc 15. Hypercarbia/hypoxia, likely OSA  -  PCCM consulted. They have signed off -  lowered pressure bipap, RT working on mask -OOB, IS, FV -continue oxygen prn O2 sat <89% -needs outpt sleep study and pulmonary f/u -resources limited d/t uninsured status -  Albuterol nebs for wheezing- prn- q6 hours prn.  -working with patient to work on using bipap at night so that he can become accustomed to it.  16.  Epistaxis mild likely related to O2 nasal canula-- -improved. -continue sprays, vasline to keep nasal mucosa moist.  -flonase for congestion     LOS: 33 days A FACE TO FACE EVALUATION WAS PERFORMED  Ranelle Oyster 04/01/2021, 9:57 AM

## 2021-04-01 NOTE — Progress Notes (Signed)
Physical Therapy Discharge Summary  Patient Details  Name: Brent Young MRN: [id number] Date of Birth: 08/06/1960  Patient has met 0 of 7 long term goals due to improved activity tolerance, improved balance, improved postural control, increased strength, ability to compensate for deficits, functional use of  left upper extremity and left lower extremity, improved attention, and improved awareness.  Patient to discharge at a wheelchair level Max Assist.  Despite meeting 0/7 LTG, pt has shown progress during his 33 day rehab stay;  he has shown improvement in efficiency and independence with sit<>stand transfers, especially with use of Stedy. Gait training has been initiated and he is able to ambulate ~69f with R hand rail and maxA with +2 assist for w/c follow.  Patient's care partner unavailable to provide the necessary physical assistance at discharge. He has had family education sessions with son but due to lack of assist available,pt to DC to SNF to resume therapies.   Reasons goals not met: Pt did not meet rehab goals as he is still at a max A level for bed mobility, +2 to dependent for transfers, +2 for short distance ambulation, and dependent for w/c mobility with use of a TIS chair. Pt remains limited by dense L hemiparesis and L neglect.  Recommendation:  Patient will benefit from ongoing skilled PT services in skilled nursing facility setting to continue to advance safe functional mobility, address ongoing impairments in L sided weakness (LLE > LUE), L inattention, dynamic sitting and standing balance, functional transfers, gait training, fall prevention, caregiver training, and minimize fall risk.  Equipment: No equipment provided. To be determine by accepting facility.  Reasons for discharge: lack of progress toward goals and discharge from hospital  Patient/family agrees with progress made and goals achieved: Yes  PT Discharge Precautions/Restrictions Precautions Precautions:  Fall Precaution Comments: L hemi, 2L supplemental oxygen via [place] Restrictions Weight Bearing Restrictions: No Pain Interference Pain Interference Pain Effect on Sleep: 2. Occasionally Pain Interference with Therapy Activities: 2. Occasionally Pain Interference with Day-to-Day Activities: 2. Occasionally Vision/Perception  Vision - History Ability to See in Adequate Light: 0 Adequate Vision - Assessment Ocular Range of Motion: Within Functional Limits Tracking/Visual Pursuits: Able to track stimulus in all quads without difficulty Saccades: Within functional limits Convergence: Within functional limits Perception Perception: Impaired Inattention/Neglect: Does not attend to left side of body Praxis Praxis: Intact  Cognition Overall Cognitive Status: Within Functional Limits for tasks assessed Arousal/Alertness: Awake/alert Orientation Level: Oriented X4 Attention: Selective Selective Attention: Impaired Selective Attention Impairment: Verbal complex;Functional complex Awareness: Appears intact Problem Solving: Impaired Problem Solving Impairment: Functional complex Safety/Judgment: Impaired Comments: Very poor safety awareness, awareness and confusion worsened over rehab stay due to CO2 retention and refusal of BiPap Sensation Sensation Light Touch: Appears Intact Hot/Cold: Appears Intact Proprioception: Impaired Detail Proprioception Impaired Details: Impaired LLE (improved since eval) Stereognosis: Impaired by gross assessment (unable to detect key in LUE (could detect in RUE)) Additional Comments: able to detect light touch ~90% accuracy for LLE Coordination Gross Motor Movements are Fluid and Coordinated: No Fine Motor Movements are Fluid and Coordinated: No Coordination and Movement Description: L hemi (LLE > LUE) that has improved since date of evaluation. Heel Shin Test: UTA on L due to hip flexor weakness. Motor  Motor Motor: Hemiplegia;Abnormal tone Motor -  Skilled Clinical Observations: dense LLE hemiplegia, hypotonic, significant core weakness w/poor sitting posture Motor - Discharge Observations: L hemiplegia continues, L LE weaker than L UE- much improved functional use of L UE  Mobility Bed Mobility Bed Mobility: Rolling Right;Rolling Left;Supine to Sit;Sit to Supine;Scooting to [facility] Rolling Right: Maximal Assistance - Patient 25-49% Rolling Left: Maximal Assistance - Patient 25-49% Supine to Sit: Maximal Assistance - Patient - Patient 25-49% Sit to Supine: Maximal Assistance - Patient 25-49% Transfers Transfers: Sit to Stand;Stand to Sit;Lateral/Scoot Transfers Sit to Stand: Moderate Assistance - Patient 50-74% Stand to Sit: Moderate Assistance - Patient 50-74% Lateral/Scoot Transfers: Maximal Assistance - Patient 25-49% Transfer (Assistive device): Other (Comment) (slide board) Locomotion  Gait Ambulation: Yes Gait Assistance: 2 Helpers;Maximal Assistance - Patient 25-49% Gait Distance (Feet): 35 Feet Assistive device: Other (Comment) (R handrail) Gait Assistance Details: Verbal cues for gait pattern;Verbal cues for precautions/safety;Verbal cues for technique;Verbal cues for sequencing;Tactile cues for posture;Tactile cues for weight shifting;Tactile cues for sequencing;Visual cues/gestures for sequencing;Manual facilitation for weight shifting;Manual facilitation for placement Gait Gait: Yes Gait Pattern: Impaired Gait Pattern: Decreased step length - left;Decreased stance time - left;Decreased dorsiflexion - left;Decreased hip/knee flexion - left;Step-to pattern;Decreased weight shift to left;Left genu recurvatum;Lateral trunk lean to left;Trunk flexed;Narrow base of support;Poor foot clearance - left Gait velocity: decreased Stairs / Additional Locomotion Stairs: No Wheelchair Mobility Wheelchair Mobility: No (in TIS w/c)  Trunk/Postural Assessment  Cervical Assessment Cervical Assessment: Within Functional Limits Thoracic  Assessment Thoracic Assessment: Exceptions to [facility] (rounded shoulders) Lumbar Assessment Lumbar Assessment: Exceptions to [facility] (posterior pelvic tilt) Postural Control Postural Control: Deficits on evaluation Trunk Control: delayed/impaired  Balance Balance Balance Assessed: Yes Static Sitting Balance Static Sitting - Balance Support: No upper extremity supported;Feet supported Static Sitting - Level of Assistance: 5: Stand by assistance Dynamic Sitting Balance Dynamic Sitting - Balance Support: No upper extremity supported;Feet supported;During functional activity Dynamic Sitting - Level of Assistance: 5: Stand by assistance Static Standing Balance Static Standing - Balance Support: Bilateral upper extremity supported;During functional activity Static Standing - Level of Assistance: 3: Mod assist Dynamic Standing Balance Dynamic Standing - Balance Support: Bilateral upper extremity supported;During functional activity Dynamic Standing - Level of Assistance: 2: Max assist;1: +2 Total assist Extremity Assessment   RLE Assessment RLE Assessment: Exceptions to [facility] RLE Strength RLE Overall Strength: Deficits Right Hip Flexion: 4/5 Right Knee Flexion: 4/5 Right Knee Extension: 4+/5 Right Ankle Dorsiflexion: 4+/5 LLE Assessment LLE Assessment: Exceptions to [facility] LLE Strength LLE Overall Strength: Deficits Left Hip Flexion: 0/5 Left Knee Flexion: 0/5 Left Knee Extension: 3-/5 Left Ankle Dorsiflexion: 0/5    Excell Seltzer, PT, DPT, CSRS 04/01/2021, 5:32 PM

## 2021-04-01 NOTE — Patient Care Conference (Signed)
Inpatient RehabilitationTeam Conference and Plan of Care Update Date: 04/01/2021   Time: 10:39 AM    Patient Name: Brent Young      Medical Record Number: [id number]  Date of Birth: [birth year] Sex: Male         Room/Bed: 4W11C/4W11C-01 Payor Info: Payor: MEDICAID [place] / Plan: MEDICAID [place]-FAMILY PLANNING / Product Type: *No Product type* /    Admit Date/Time:  02/27/2021  3:04 PM  Primary Diagnosis:  Infarction of right basal ganglia [facility])  Hospital Problems: Principal Problem:   Infarction of right basal ganglia [facility])    Expected Discharge Date: Expected Discharge Date:  (SNF possibly 04/02/21)  Team Members Present: Physician leading conference: Dr. Faith Rogue Social Worker Present: [name], LCSWA Nurse Present: Kennyth Arnold, RN PT Present: Merry Lofty, PT OT Present: Kearney Hard, OT PPS Coordinator present : Fae Pippin, SLP     Current Status/Progress Goal Weekly Team Focus  Bowel/Bladder   cont of bladder but more incont. episodes of bladder at night, incontinent of bowels. Last BM 1/9  progress to full continence of bowel and bladder  assess q shift and PRN. Encourage proactive toilating   Swallow/Nutrition/ Hydration             ADL's   [place] progress this week, Min/CGA A sit<>stand in [place], mod A sit<>stand without Stedy, L UE improving at gross assist  Mod/max A- may upgrade  dc planning, pt.family education, self-care retraining, L UE NMR, standing balance, transfers   Mobility   modA to maxa bed mobility, minA to modA sit to stand transfer, gait x10' with maxA +2 and RW  minA WC level  family ed, consistency with performance, bed mobility, transfers, ambulation   Communication             Safety/Cognition/ Behavioral Observations            Pain   No c/o pain, voltaren gel applied to rt. shoulder  <3/10 pain  Assess q shift and PRN   Skin   lower extremities abrassion fully scabbed over  remain freee of skin impairments/infection   Assess qshift and PRN     Discharge Planning:  Pt is uninsured. Pt screened by financial counselors. MCD pending DDS eligibility. Currenlty working on [employer]. Pending at this time. If not approved, pt will have to d/c to home with his son [name] being primary caregiver. Fam edu completed. [facility] referral sent to [facility] and waiting on updates. [facility] DME: hospital bed and DABSC with [facility]- placed on hold pending d/c location. Will have to return loaner w/c if he goes to SNF.   Team Discussion: CO2 level increases at night, becomes confused, continues to refuse Bipap. Anxiety also increases. Have bed offer at [facility] pending. Will need Covid test. Still has poor awareness. Mod/max assist +2. Today will be grad day. Continue with 2L, oxygen. Could use WC with laptray. Patient on target to meet rehab goals: yes, min assist at wheel chair level.  *See Care Plan and progress notes for long and short-term goals.   Revisions to Treatment Plan:  Finalizing discharge plans.  Teaching Needs: Family education, medication management, safety awareness, transfer training, etc.  Current Barriers to Discharge: Decreased caregiver support, Home enviroment access/layout, Lack of/limited family support, Insurance for SNF coverage, Weight, and Medication compliance  Possible Resolutions to Barriers: Family education Pending bed offer at [facility] with laptray     Medical Summary Current Status: still working on bipap use.  requires supplemental oxygen. making functional gains with mobility  Barriers to Discharge: Medical stability   Possible Resolutions to [employer] Focus: daily assessment of labs and pt ata, bipap trials.   Continued Need for Acute Rehabilitation Level of Care: The patient requires daily medical management by a physician with specialized training in physical medicine and rehabilitation for the following reasons: Direction of a  multidisciplinary physical rehabilitation program to maximize functional independence : Yes Medical management of patient stability for increased activity during participation in an intensive rehabilitation regime.: Yes Analysis of laboratory values and/or radiology reports with any subsequent need for medication adjustment and/or medical intervention. : Yes   I attest that I was present, lead the team conference, and concur with the assessment and plan of the team.   Tennis Must 04/01/2021, 3:12 PM

## 2021-04-01 NOTE — Progress Notes (Signed)
Physical Therapy Session Note  Patient Details  Name: Brent Young MRN: [id number] Date of Birth: Feb 19, 1961  Today's Date: 04/01/2021 PT Individual Time: 1300-1400 PT Individual Time Calculation (min): 60 min   Short Term Goals: Week 5:  PT Short Term Goal 1 (Week 5): STGs = LTGs  Skilled Therapeutic Interventions/Progress Updates:     Pt presents sitting in TIS w/c, on 2L O2 via [place], pt agreeable to PT tx. Reports unrated L knee pain. Provided mobility, repositioning, and rest breaks for pain management during treatment session. Connected to 2L portable tank during treatment. Transported pt in w/c to ortho rehab gym for time. Assisted to mat table via maxA sliding board transfer with +2 assist for safety. Required facilitation for forward weight shift, LLE placement, and guiding hips for head/hip ratio. Required 4-5 scoots in order to achieve full transfer. Able to sit unsupported on mat table with SBA. Completed sensation, proprioception, MMT, and visual assessment (see DC note for details). Worked on sit<>stands with no AD from low mat table height, required maxA of 1 with +2 assist on standby. Requires modA for static standing balance, unsupported. Worked on static standing for postural awareness and postural control with VC needed for corrections when leaning L. Assisted back to w/c via SB +2 assist transfer and then wheeled to main rehab hallway to focus remainder of session on gait training. Using R hand rail and +2 assist for w/c follow, he ambulated ~40ft with maxA with his L arm draped over therapist's shoulder - PT facilitating trunk support, lateral weight shifting, and (totalA) managing LLE in gait cycle. Pt returned to his room in w/c for time and concluded session slightly tilted in TIS w/c with safety belt alarm on, reconnected to wall O2 at 2L, all needs within reach.  Therapy Documentation Precautions:  Precautions Precautions: Fall Precaution Comments: BP goal 130-160 per  neuro MD; left side weakness, more impairment noted in the LE vs UE Other Brace: AFO for LLE Restrictions Weight Bearing Restrictions: No General:    Therapy/Group: Individual Therapy  Orrin Brigham 04/01/2021, 7:42 AM

## 2021-04-02 DIAGNOSIS — G4733 Obstructive sleep apnea (adult) (pediatric): Secondary | ICD-10-CM

## 2021-04-02 LAB — GLUCOSE, CAPILLARY
Glucose-Capillary: 92 mg/dL (ref 70–99)
Glucose-Capillary: 101 mg/dL — ABNORMAL HIGH (ref 70–99)
Glucose-Capillary: 87 mg/dL (ref 70–99)
Glucose-Capillary: 93 mg/dL (ref 70–99)

## 2021-04-02 MED ORDER — DICLOFENAC SODIUM 1 % EX GEL: 2.0000 g | Freq: Four times a day (QID) | CUTANEOUS | Status: DC

## 2021-04-02 NOTE — Progress Notes (Signed)
PROGRESS NOTE   Subjective/Complaints:  No new issues. Limited use of bipap  ROS: Patient denies fever, rash, sore throat, blurred vision, nausea, vomiting, diarrhea, cough, shortness of breath or chest pain,  headache, or mood change.    Objective:   No results found. Recent Labs    04/01/21 0525  WBC 6.8  HGB 9.8*  HCT 30.5*  PLT 327      Recent Labs    04/01/21 0525  NA 142  K 3.4*  CL 101  CO2 32  GLUCOSE 98  BUN 8  CREATININE 0.57*  CALCIUM 8.7*         Intake/Output Summary (Last 24 hours) at 04/02/2021 1226 Last data filed at 04/02/2021 0700 Gross per 24 hour  Intake 700 ml  Output 350 ml  Net 350 ml        Physical Exam: Vital Signs Blood pressure (!) 141/86, pulse 96, temperature 98.6 F (37 C), resp. rate 18, height 6\' 1"  (1.854 m), weight 114.7 kg, SpO2 94 %.     Constitutional: No distress . Vital signs reviewed. obese HEENT: NCAT, EOMI, oral membranes moist Neck: supple Cardiovascular: RRR without murmur. No JVD    Respiratory/Chest: CTA Bilaterally without wheezes or rales. Normal effort    GI/Abdomen: BS +, non-tender, non-distended Ext: no clubbing, cyanosis, or edema Psych: pleasant and cooperative  Skin: left knee abrasion resolving Neuro:  more alert and on point. Functional memory and awareness. verbose   Mild left central VII, mild left hemi-facial sensory loss. LUE 2+ deltoid, 3/5 biceps, tricep,   3/5 wrist/ HI- . LLE:   1+ to 2 HF, KE- ,  tr/5 distally-not much change in appearance .  Left arm and leg 1/2 light touch. No resting tone  Musculoskeletal: mild right shoulder pain with AROM   Assessment/Plan: 1. Functional deficits which require 3+ hours per day of interdisciplinary therapy in a comprehensive inpatient rehab setting. Physiatrist is providing close team supervision and 24 hour management of active medical problems listed below. Physiatrist and rehab  team continue to assess barriers to discharge/monitor patient progress toward functional and medical goals  Care Tool:  Bathing    Body parts bathed by patient: Chest, Abdomen, Front perineal area, Face, Left arm, Right upper leg, Left upper leg   Body parts bathed by helper: Right arm, Buttocks, Right lower leg, Left lower leg     Bathing assist Assist Level: Moderate Assistance - Patient 50 - 74%     Upper Body Dressing/Undressing Upper body dressing   What is the patient wearing?: Pull over shirt    Upper body assist Assist Level: Minimal Assistance - Patient > 75%    Lower Body Dressing/Undressing Lower body dressing      What is the patient wearing?: Pants     Lower body assist Assist for lower body dressing: Maximal Assistance - Patient 25 - 49%     Toileting Toileting    Toileting assist Assist for toileting: Maximal Assistance - Patient 25 - 49%     Transfers Chair/bed transfer  Transfers assist  Chair/bed transfer activity did not occur: Safety/medical concerns  Chair/bed transfer assist level: Dependent - mechanical lift (  stedy)     Locomotion Ambulation   Ambulation assist   Ambulation activity did not occur: Safety/medical concerns  Assist level: Moderate Assistance - Patient 50 - 74% Assistive device: Parallel bars Max distance: 8 ft   Walk 10 feet activity   Assist  Walk 10 feet activity did not occur: Safety/medical concerns        Walk 50 feet activity   Assist Walk 50 feet with 2 turns activity did not occur: Safety/medical concerns         Walk 150 feet activity   Assist Walk 150 feet activity did not occur: Safety/medical concerns         Walk 10 feet on uneven surface  activity   Assist Walk 10 feet on uneven surfaces activity did not occur: Safety/medical concerns         Wheelchair     Assist Is the patient using a wheelchair?: Yes Type of Wheelchair: Manual Wheelchair activity did not occur:  Safety/medical concerns  Wheelchair assist level: Dependent - Patient 0% Max wheelchair distance: 150'    Wheelchair 50 feet with 2 turns activity    Assist    Wheelchair 50 feet with 2 turns activity did not occur: Safety/medical concerns   Assist Level: Dependent - Patient 0%   Wheelchair 150 feet activity     Assist  Wheelchair 150 feet activity did not occur: Safety/medical concerns   Assist Level: Dependent - Patient 0%   Blood pressure (!) 141/86, pulse 96, temperature 98.6 F (37 C), resp. rate 18, height 6\' 1"  (1.854 m), weight 114.7 kg, SpO2 94 %.  Medical Problem List and Plan: 1. 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  2. Impaired mobility: continue Lovenox             -antiplatelet therapy: Aspirin 325 mg daily and Plavix 75 mg daily x3 months then aspirin alone 3. R shoulder pain from fall -continue Neurontin 300 mg 3 times daily, off oxycodone d/t "loopiness" -ROM, strengthening of R shoulder with therapies 4. Anxiety   Remains an issues. Co2 retention doesn't help at night             -antipsychotic agents: N/A 5. Neuropsych: This patient is capable of making decisions on his own behalf. 6. Skin/Wound Care: local care as needed 7. Fluids/Electrolytes/Nutrition: encourage PO    8.  Dysphagia.  a advanced to regular thins.   9.  Hypertension.  Norvasc 10 mg daily Avapro 75 mg daily, Toprol-XL 25 mg daily    1/11 reasonable control 10.  Diabetes mellitus.  Hemoglobin A1c 9.9.  SSI, Tradjenta 5 mg daily, Glucophage 1000 mg twice daily.   CBG (last 3)  Recent Labs    04/01/21 2056 04/02/21 0625 04/02/21 1137  GLUCAP 109* 92 101*  1/11- Good Bg control- con't regimen 11.  Hyperlipidemia.  Lipitor 12.  Tobacco use.  Counseling 13.  Seizure  prophylaxis.  Continue Trileptal 300 mg 3 times daily 14. Constipation-  -moving bowels-continue senna-s at HS as mtc 15. Hypercarbia/hypoxia, likely OSA  -  PCCM consulted. They have signed off -  lowered pressure bipap, RT working on mask -OOB, IS, FV -continue oxygen prn O2 sat <89% -needs outpt sleep study and pulmonary f/u -resources limited  d/t uninsured status -  Albuterol nebs for wheezing- prn- q6 hours prn.  -no bipap at facility. Can try cpap if willing.   16.  Epistaxis mild likely related to O2 nasal canula-- -resolved. -continue sprays, vasline to keep nasal mucosa moist.  -flonase for congestion     LOS: 34 days A FACE TO FACE EVALUATION WAS PERFORMED  Ranelle Oyster 04/02/2021, 12:26 PM

## 2021-04-02 NOTE — Progress Notes (Signed)
Patient ID: GUNNAR HEREFORD, male   DOB: [birth year], 61 y.o.   MRN: [id number]  SW received updates from [facility]/Admissions with [facility] reporting they do not have a dump chair, and no Bipap machine. SW waiting on updates from medical team for alternatives for both.  *SW received updates from medical team that pt can d/c with continuous oxygen of 2L [place] since pt struggled with Bipap machine. SW updated SNF and asked about w/c with lap tray. SW waiting on follow-up if pt can transition today.   SW received updates from Cl[name] that there is a hold on admissions for today. SW waiting on follow-up when pt can transition. They also do not allow lay trays as they are considered restraints in SNF. SW updated medical team. Pt will be able to transport with [facility] to SNF despite being uninsured as pt is Medicaid pending.   [name], MSW, LCSWA Office: [phone] Cell: [phone] Fax: [phone]

## 2021-04-02 NOTE — Progress Notes (Signed)
Inpatient Rehabilitation Discharge Medication Review by a Pharmacist  A complete drug regimen review was completed for this patient to identify any potential clinically significant medication issues.  High Risk Drug Classes Is patient taking? Indication by Medication  Antipsychotic No   Anticoagulant No   Antibiotic No   Opioid No   Antiplatelet Yes Plavix, Aspirin for CVA  Hypoglycemics/insulin Yes Metformin, Linagliptin for DM  Vasoactive Medication Yes Amlodipine,Toprol,  irbesartan for BP  Chemotherapy No   Other Yes      Type of Medication Issue Identified Description of Issue Recommendation(s)  Drug Interaction(s) (clinically significant)     Duplicate Therapy     Allergy     No Medication Administration End Date     Incorrect Dose     Additional Drug Therapy Needed     Significant med changes from prior encounter (inform family/care partners about these prior to discharge).  D/c Vit D, Amaryl, HCTZ, losartan, and januvia  Other       Clinically significant medication issues were identified that warrant physician communication and completion of prescribed/recommended actions by midnight of the next day:  No  Pharmacist comments: None  Time spent performing this drug regimen review (minutes):  20 minutes  Brayon Bielefeld S. Merilynn Finland, PharmD, BCPS Clinical Staff Pharmacist [email or web address] Pasty Spillers 04/02/2021 10:38 AM

## 2021-04-03 LAB — CREATININE, SERUM
Creatinine, Ser: 0.7 mg/dL (ref 0.61–1.24)
GFR, Estimated: >60 mL/min (ref >60)

## 2021-04-03 LAB — GLUCOSE, CAPILLARY
Glucose-Capillary: 96 mg/dL (ref 70–99)
Glucose-Capillary: 102 mg/dL — ABNORMAL HIGH (ref 70–99)
Glucose-Capillary: 111 mg/dL — ABNORMAL HIGH (ref 70–99)
Glucose-Capillary: 96 mg/dL (ref 70–99)

## 2021-04-03 NOTE — Progress Notes (Signed)
PROGRESS NOTE   Subjective/Complaints:  Pt had a fair night. O2 was off when I came in. Aware that he's likely leaving today  ROS: Patient denies fever, rash, sore throat, blurred vision, nausea, vomiting, diarrhea  chest pain, joint or back pain, headache, or mood change.     Objective:   No results found. Recent Labs    04/01/21 0525  WBC 6.8  HGB 9.8*  HCT 30.5*  PLT 327      Recent Labs    04/01/21 0525 04/03/21 0525  NA 142  --   K 3.4*  --   CL 101  --   CO2 32  --   GLUCOSE 98  --   BUN 8  --   CREATININE 0.57* 0.70  CALCIUM 8.7*  --          Intake/Output Summary (Last 24 hours) at 04/03/2021 1032 Last data filed at 04/03/2021 0700 Gross per 24 hour  Intake 720 ml  Output --  Net 720 ml        Physical Exam: Vital Signs Blood pressure (!) 123/44, pulse 89, temperature 97.9 F (36.6 C), temperature source Oral, resp. rate 17, height 6\' 1"  (1.854 m), weight 114.7 kg, SpO2 91 %.     Constitutional: No distress . Vital signs reviewed. HEENT: NCAT, EOMI, oral membranes moist Neck: supple Cardiovascular: RRR without murmur. No JVD    Respiratory/Chest: CTA Bilaterally without wheezes or rales. Normal effort    GI/Abdomen: BS +, non-tender, non-distended Ext: no clubbing, cyanosis, or edema Psych: pleasant and cooperative  Skin: left knee abrasion resolved Neuro:  fairly alert. Just waking up. Functional memory and awareness. verbose   Mild left central VII, mild left hemi-facial sensory loss. LUE 2+ deltoid, 3/5 biceps, tricep,   3/5 wrist/ HI- . LLE:   1+ to 2 HF, KE- ,  tr/5 distally-not much change in appearance .  Left arm and leg 1/2 light touch. No abnl tone  Musculoskeletal: mild right shoulder pain with AROM   Assessment/Plan: 1. Functional deficits which require 3+ hours per day of interdisciplinary therapy in a comprehensive inpatient rehab setting. Physiatrist is providing  close team supervision and 24 hour management of active medical problems listed below. Physiatrist and rehab team continue to assess barriers to discharge/monitor patient progress toward functional and medical goals  Care Tool:  Bathing    Body parts bathed by patient: Chest, Abdomen, Front perineal area, Face, Left arm, Right upper leg, Left upper leg   Body parts bathed by helper: Right arm, Buttocks, Right lower leg, Left lower leg     Bathing assist Assist Level: Moderate Assistance - Patient 50 - 74%     Upper Body Dressing/Undressing Upper body dressing   What is the patient wearing?: Pull over shirt    Upper body assist Assist Level: Minimal Assistance - Patient > 75%    Lower Body Dressing/Undressing Lower body dressing      What is the patient wearing?: Pants     Lower body assist Assist for lower body dressing: Maximal Assistance - Patient 25 - 49%     Toileting Toileting    Toileting assist Assist for  toileting: Maximal Assistance - Patient 25 - 49%     Transfers Chair/bed transfer  Transfers assist  Chair/bed transfer activity did not occur: Safety/medical concerns  Chair/bed transfer assist level: Dependent - mechanical lift (stedy)     Locomotion Ambulation   Ambulation assist   Ambulation activity did not occur: Safety/medical concerns  Assist level: Moderate Assistance - Patient 50 - 74% Assistive device: Parallel bars Max distance: 8 ft   Walk 10 feet activity   Assist  Walk 10 feet activity did not occur: Safety/medical concerns        Walk 50 feet activity   Assist Walk 50 feet with 2 turns activity did not occur: Safety/medical concerns         Walk 150 feet activity   Assist Walk 150 feet activity did not occur: Safety/medical concerns         Walk 10 feet on uneven surface  activity   Assist Walk 10 feet on uneven surfaces activity did not occur: Safety/medical concerns          Wheelchair     Assist Is the patient using a wheelchair?: Yes Type of Wheelchair: Manual Wheelchair activity did not occur: Safety/medical concerns  Wheelchair assist level: Dependent - Patient 0% Max wheelchair distance: 150'    Wheelchair 50 feet with 2 turns activity    Assist    Wheelchair 50 feet with 2 turns activity did not occur: Safety/medical concerns   Assist Level: Dependent - Patient 0%   Wheelchair 150 feet activity     Assist  Wheelchair 150 feet activity did not occur: Safety/medical concerns   Assist Level: Dependent - Patient 0%   Blood pressure (!) 123/44, pulse 89, temperature 97.9 F (36.6 C), temperature source Oral, resp. rate 17, height 6\' 1"  (1.854 m), weight 114.7 kg, SpO2 91 %.  Medical Problem List and Plan: 1. 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 -f/u with [facility] in 4-6 weeks.  2. Impaired mobility: continue Lovenox             -antiplatelet therapy: Aspirin 325 mg daily and Plavix 75 mg daily x3 months then aspirin alone 3. R shoulder pain from fall -continue Neurontin 300 mg 3 times daily, off oxycodone d/t "loopiness" -ROM, strengthening of R shoulder with therapies 4. Anxiety   Remains an issue. Co2 retention doesn't help at night             -antipsychotic agents: N/A 5. Neuropsych: This patient is capable of making decisions on his own behalf. 6. Skin/Wound Care: local care as needed 7. Fluids/Electrolytes/Nutrition: encourage PO    8.  Dysphagia.  a advanced to regular thins.   9.  Hypertension.  Norvasc 10 mg daily Avapro 75 mg daily, Toprol-XL 25 mg daily    1/12 reasonable control 10.  Diabetes mellitus.  Hemoglobin A1c 9.9.  SSI, Tradjenta 5 mg daily, Glucophage 1000 mg twice daily.   CBG (last 3)  Recent Labs    04/02/21 1633  04/02/21 2112 04/03/21 0632  GLUCAP 87 93 96  1/12- Good Bg control- avoid hypoglycemia 11.  Hyperlipidemia.  Lipitor 12.  Tobacco use.  Counseling 13.  Seizure prophylaxis.  Continue Trileptal 300 mg 3 times daily 14. Constipation-  -moving bowels-continue senna-s at HS as mtc 15.  Hypercarbia/hypoxia, likely OSA  -OOB, IS, FV -continue oxygen prn O2 sat <89% -needs outpt sleep study and pulmonary f/u -resources limited d/t uninsured status -  Albuterol nebs for wheezing- prn- q6 hours prn.  -no bipap at facility. Can try cpap if willing.   16.  Epistaxis mild likely related to O2 nasal canula-- -resolved. -continue sprays, vasline to keep nasal mucosa moist.  -flonase for congestion     LOS: 35 days A FACE TO FACE EVALUATION WAS PERFORMED  Ranelle Oyster 04/03/2021, 10:32 AM

## 2021-04-03 NOTE — Progress Notes (Signed)
Patient ID: Brent Young, male   DOB: 1960-09-01, 61 y.o.   MRN: [id number]  SW met with pt and pt son Brent Young to inform waiting on follow-up from SNF on if pt can be accepted. SW will inform once there is more information.   SW spoke with for [facility]/Admissions at [facility] [phone]) to discuss admission for today. Reports likely can admit today pending there is enough oxygen in the building due to several admissions requiring oxygen. Also reports pt will be placed in geri-chair to accommodate his medical condition. Will verify with SW if pt can be accepted today. SW will schedule transportation pick up for 2pm.   SW scheduled [facility] pick for 2pm. SW informed medical team and will confirm if pt will d/c today.   [facility] able to accept today. SW updated pt and his son, and medical team. D/c packet left at front desk.   [name], MSW, [place] Office: [phone] Cell: [phone] Fax: [phone]

## 2021-04-03 NOTE — Progress Notes (Signed)
Notified by PT[facility] on the way will be there in 15 mins, assined nurse made aware

## 2021-04-04 NOTE — Progress Notes (Signed)
Inpatient Rehabilitation Care Coordinator Discharge Note   Patient Details  Name: Brent Young MRN: [id number] Date of Birth: 1960/07/02   Discharge location: D/c to SNF- [name]  Length of Stay: 35 days  Discharge activity level: Max A  Home/community participation: Limited  Patient response [id number] Literacy - How often do you need to have someone help you when you read instructions, pamphlets, or other written material from your doctor or pharmacy?: Never  Patient response [id number] Isolation - How often do you feel lonely or isolated from those around you?: Never  Services provided included: MD, RD, PT, OT, SLP, CM, Pharmacy, SW, Neuropsych, TR, Industrial/product designer Services:  Field seismologist Utilized: Other (Comment) (self pay)    Choices offered to/list presented to: N/A  Follow-up services arranged:    Patient response to transportation need: Is the patient able to respond to transportation needs?: Yes In the past 12 months, has lack of transportation kept you from medical appointments or from getting medications?: No In the past 12 months, has lack of transportation kept you from meetings, work, or from getting things needed for daily living?: No   Comments (or additional information):  Patient/Family verbalized understanding of follow-up arrangements:  Yes  Individual responsible for coordination of the follow-up plan: contact pt [phone]  Confirmed correct DME delivered: Gretchen Short 04/04/2021    Gretchen Short

## 2021-04-30 ENCOUNTER — Encounter: Payer: Self-pay | Admitting: Registered Nurse

## 2021-06-04 NOTE — Progress Notes (Signed)
?[name] ?[id number] ?[place]. [place] [address] ?[phone] ? ?     HOSPITAL FOLLOW UP NOTE ? ?[name] ?Date of Birth:  [birth year] ?Medical Record Number:  [id number]  ? ?Reason for Referral:  hospital stroke follow up ? ? ? ?SUBJECTIVE: ? ? ?CHIEF COMPLAINT:  ?Chief Complaint  ?Patient presents with  ? Follow-up  ?  Rm 10, alone. Hospital f/u, due to stroke. Pt reports doing well. BP has been high. Ambulates w/ WC.   ? ? ?HPI:  ? ?FAREED Young is a 61 y.o. who  has a past medical history of Allergy, Arthritis, Diabetes mellitus without complication (HCC), Hypertension, and Stroke (HCC).  Patient presented on 02/16/2021 with left leg numbness and weakness. He woke up that morning an was unable to feel leg. He fell to the floor when he tried to get out of bed. [name] helped him get back to bed and left for work. Later, he tried to get back up and fell again. He was unable to get up and laid in the floor most of the day. When family returned home they called EMS and he was transported to the ER. 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 Cerebral angiogram completed showed left ICA 65 to 70% stenosis, bilateral ICA siphon mild to moderate stenosis right VA stenosis. tPA not administered. He was started on asa 325mg  and Plavix 75mg  daily for three months then advised to continue asa alone. He was admitted to [facility] 02/27/2021. He participated in PT/OT/ST for 3 hours 5 days a week. He was discharged from [facility] 04/03/2021. He is now residing at [facility]. He continues PT/OT/ST. He feels that left arm weakness is improving. He continues to have left lower weakness. Sensation is normal. He has walked a few steps with therapy but mostly wheelchair bound. He plans to return home with his son asap. He was advised to follow up out patient with pulmonology for sleep study due to hypercarbia/hypoxia requiring O2 therapy. Likely OSA. He reports  supplemental O2 needed with PT but otherwise he does not use. He denies difficulty breathing. He denies smoking.  ? ?Personally reviewed hospitalization pertinent progress notes, lab work and imaging.  Evaluated by Dr. 06/01/2021.  ? ? ?PERTINENT IMAGING/LABS ? ?CT Head - No acute intracranial process. ASPECTS is 10. 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 Re-demonstrated moderate to severe calcified plaque in the carotid siphons, right greater than left, overall unchanged. 65-70% stenosis of the proximal left ICA ?MRI head - Acute to subacute perforator infarcts at the right basal ganglia and corona radiata. Extensive chronic small vessel ischemia. ?Cerebral angiogram left ICA 65 to 70% stenosis, bilateral ICA siphon mild to moderate stenosis, right VA severe stenosis. ?2D Echo - EF 60 to 65% ? ? ?A1C ?Lab Results  ?Component Value Date  ? HGBA1C 9.9 (H) 02/17/2021  ? ? ?Lipid Panel  ?   ?Component Value Date/Time  ? CHOL 148 02/17/2021 0328  ? CHOL 141 10/08/2020 1140  ? TRIG 120 02/17/2021 0328  ? HDL 26 (L) 02/17/2021 0328  ? HDL 43 10/08/2020 1140  ? CHOLHDL 5.7 02/17/2021 0328  ? VLDL 24 02/17/2021 0328  ? LDLCALC 98 02/17/2021 0328  ? LDLCALC 75 10/08/2020 1140  ? LABVLDL 23 10/08/2020 1140  ? ? ?ROS:   ?  14 system review of systems performed and negative with exception of those listed in HPI ? ?PMH:  ?Past Medical History:  ?Diagnosis Date  ? Allergy   ? Arthritis   ? Diabetes mellitus without complication (HCC)   ? Hypertension   ? Stroke [facility](HCC)   ? ? ?PSH:  ?Past Surgical History:  ?Procedure Laterality Date  ? FRACTURE SURGERY Left   ? Patient fractured left arm  ? IR ANGIO INTRA EXTRACRAN SEL COM CAROTID INNOMINATE UNI L MOD SED  02/19/2021  ? IR ANGIO INTRA EXTRACRAN SEL INTERNAL CAROTID UNI R MOD SED  02/19/2021  ? IR ANGIO VERTEBRAL SEL SUBCLAVIAN INNOMINATE UNI R MOD SED  02/19/2021  ? IR  ANGIO VERTEBRAL SEL VERTEBRAL UNI L MOD SED  02/19/2021  ? IR US GUIDE VASC ACCESS RIGHT  02/19/2021  ? ? ?Social History:  ?Social History  ? ?Socioeconomic History  ? Marital status: Married  ?  Spouse name: Not on file  ? Number of children: Not on file  ? Years of education: Not on file  ? Highest education level: Not on file  ?Occupational History  ? Not on file  ?Tobacco Use  ? Smoking status: Some Days  ?  Types: Cigarettes  ? Smokeless tobacco: Never  ?Vaping Use  ? Vaping Use: Never used  ?Substance and Sexual Activity  ? Alcohol use: Yes  ?  Comment: occasionally  ? Drug use: Yes  ?  Types: Marijuana  ? Sexual activity: Not on file  ?Other Topics Concern  ? Not on file  ?Social History Narrative  ? Not on file  ? ?Social Determinants of Health  ? ?Financial Resource Strain: Not on file  ?Food Insecurity: Not on file  ?Transportation Needs: Not on file  ?Physical Activity: Not on file  ?Stress: Not on file  ?Social Connections: Not on file  ?Intimate Partner Violence: Not on file  ? ? ?Family History:  ?Family History  ?Problem Relation Age of Onset  ? Colon cancer Maternal Grandmother   ? Esophageal cancer Neg Hx   ? Stomach cancer Neg Hx   ? Rectal cancer Neg Hx   ? ? ?Medications:   ?Current Outpatient Medications on File Prior to Visit  ?Medication Sig Dispense Refill  ? acetaminophen (TYLENOL) 650 MG suppository Place 1 suppository (650 mg total) rectally every 4 (four) hours as needed for mild pain (or temp > 37.5 C (99.5 F)). 12 suppository 0  ? albuterol (PROVENTIL) (2.5 MG/3ML) 0.083% nebulizer solution Take 3 mLs (2.5 mg total) by nebulization every 6 (six) hours as needed for wheezing or shortness of breath. 75 mL 12  ? amLODipine (NORVASC) 10 MG tablet Take 1 tablet (10 mg total) by mouth daily. 90 tablet 1  ? aspirin EC 325 MG EC tablet Take 1 tablet (325 mg total) by mouth daily. 30 tablet 0  ? atorvastatin (LIPITOR) 40 MG tablet Take 1 tablet (40 mg total) by mouth daily.    ? clopidogrel  (PLAVIX) 75 MG tablet Take 1 tablet (75 mg total) by mouth daily.    ? diclofenac Sodium (VOLTAREN) 1 % GEL Apply 2 g topically 4 (four) times daily.    ? fluticasone (FLONASE) 50 MCG/ACT nasal spray Place 2 sprays into both nostrils daily.  2  ? gabapentin (NEURONTIN) 300 MG capsule TAKE 1 CAPSULE BY MOUTH THREE TIMES A DAY (Patient taking differently: Take 300 mg by mouth 3 (three) times daily.) 90 capsule 1  ? hydrOXYzine (ATARAX) 10  MG tablet Take 1 tablet (10 mg total) by mouth 3 (three) times daily as needed for anxiety. 30 tablet 0  ? irbesartan (AVAPRO) 75 MG tablet Take 1 tablet (75 mg total) by mouth daily.    ? linagliptin (TRADJENTA) 5 MG TABS tablet Take 1 tablet (5 mg total) by mouth daily. 30 tablet   ? metFORMIN (GLUCOPHAGE) 1000 MG tablet Take 1 tablet (1,000 mg total) by mouth 2 (two) times daily with a meal. 180 tablet 3  ? metoprolol succinate (TOPROL-XL) 25 MG 24 hr tablet Take 1 tablet (25 mg total) by mouth daily.    ? mometasone-formoterol (DULERA) 100-5 MCG/ACT AERO Inhale 2 puffs into the lungs 2 (two) times daily. 1 each   ? Oxcarbazepine (TRILEPTAL) 300 MG tablet Take 1 tablet (300 mg total) by mouth 3 (three) times daily. 270 tablet 1  ? pantoprazole (PROTONIX) 40 MG tablet Take 1 tablet (40 mg total) by mouth daily.    ? ?No current facility-administered medications on file prior to visit.  ? ? ?Allergies:  No Known Allergies ? ? ? ?OBJECTIVE: ? ?Physical Exam ? ?Vitals:  ? 06/05/21 0811  ?BP: (!) 160/91  ?Pulse: 89  ?Weight: 252 lb (114.3 kg)  ?Height: 6\' 1"  (1.854 m)  ? ?Body mass index is 33.25 kg/m? ?No results found. ? ?Depression screen [facility] 2/9 10/08/2020  ?Decreased Interest 1  ?Down, Depressed, Hopeless 1  ?PHQ - 2 Score 2  ?Altered sleeping 1  ?Tired, decreased energy 1  ?Change in appetite 0  ?Feeling bad or failure about yourself  0  ?Trouble concentrating 0  ?Moving slowly or fidgety/restless 0  ?Suicidal thoughts 0  ?PHQ-9 Score 4  ?Difficult doing work/chores Not difficult  at all  ?  ? ?General: well developed, well nourished, seated, in no evident distress ?Head: head normocephalic and atraumatic.   ?Neck: supple with no carotid or supraclavicular bruits ?Cardiovascular: reg

## 2021-06-05 ENCOUNTER — Encounter: Payer: Self-pay | Admitting: Family Medicine

## 2021-06-05 ENCOUNTER — Ambulatory Visit (INDEPENDENT_AMBULATORY_CARE_PROVIDER_SITE_OTHER): Payer: Medicaid Other | Admitting: Family Medicine

## 2021-06-05 ENCOUNTER — Other Ambulatory Visit: Payer: Self-pay

## 2021-06-05 VITALS — BP 160/91 | HR 89 | Ht 73.0 in | Wt 252.0 lb

## 2021-06-05 DIAGNOSIS — E1169 Type 2 diabetes mellitus with other specified complication: Secondary | ICD-10-CM | POA: Diagnosis not present

## 2021-06-05 DIAGNOSIS — I639 Cerebral infarction, unspecified: Secondary | ICD-10-CM | POA: Diagnosis not present

## 2021-06-05 DIAGNOSIS — R531 Weakness: Secondary | ICD-10-CM | POA: Diagnosis not present

## 2021-06-05 DIAGNOSIS — I10 Essential (primary) hypertension: Secondary | ICD-10-CM | POA: Diagnosis not present

## 2021-06-05 NOTE — Patient Instructions (Addendum)
Below is our plan: ? ?Discontinue Plavix. Three month DAPT completed. Continue aspirin 325mg  daily.  ? ?HTN: BP goal <130/90. Today's reading is 160/91. Please continue irbesartan metoprolol and amlodipine as directed by PCP. Reach out to PCP if readings are consistently greater than 140/90. ?HLD: LDL goal <70. Recent LDL 98. Continue atorvastatin 40mg  daily per PCP.  ?DMII: A1c goal<7.0. Recent A1c 9.9. Please continue close follow up with PCP for CBG management.  ?Obesity: Healthy lifestyle habits with low carb diet and regular exercise.  ?Seizure Prevention: continue oxcarbazepine per PCP  ? ?Please discuss need to schedule sleep study. You were referred to pulmonology for this and continuation of supplemental oxygen evaluation. Let me know if you are unable to schedule appt.  ? ?We will continue therapy with [name] as scheduled. Please keep a close eye on BP. Contact PCP if readings are consistently greater than 140/90.  ? ?Please make sure you are staying well hydrated. I recommend 50-60 ounces daily. Well balanced diet and regular exercise encouraged. Consistent sleep schedule with 6-8 hours recommended.  ? ?Please continue follow up with care team as directed.  ? ?Follow up with me in 4-6 months  ? ?You may receive a survey regarding today's visit. I encourage you to leave honest feed back as I do use this information to improve patient care. Thank you for seeing me today!  ? ? ?

## 2021-06-12 NOTE — Progress Notes (Signed)
I agree with the above plan 

## 2021-10-28 ENCOUNTER — Encounter (HOSPITAL_COMMUNITY): Payer: Self-pay

## 2021-10-28 ENCOUNTER — Emergency Department (HOSPITAL_COMMUNITY)
Admission: EM | Admit: 2021-10-28 | Discharge: 2021-10-29 | Disposition: A | Discharge status: Home / Self Care | Payer: Medicaid Other | Attending: Emergency Medicine | Admitting: Emergency Medicine

## 2021-10-28 ENCOUNTER — Other Ambulatory Visit: Payer: Self-pay

## 2021-10-28 ENCOUNTER — Emergency Department (HOSPITAL_COMMUNITY): Payer: Medicaid Other

## 2021-10-28 DIAGNOSIS — Z7984 Long term (current) use of oral hypoglycemic drugs: Secondary | ICD-10-CM | POA: Diagnosis not present

## 2021-10-28 DIAGNOSIS — R531 Weakness: Secondary | ICD-10-CM | POA: Diagnosis present

## 2021-10-28 DIAGNOSIS — I10 Essential (primary) hypertension: Secondary | ICD-10-CM | POA: Diagnosis not present

## 2021-10-28 DIAGNOSIS — Z7982 Long term (current) use of aspirin: Secondary | ICD-10-CM | POA: Insufficient documentation

## 2021-10-28 DIAGNOSIS — N390 Urinary tract infection, site not specified: Secondary | ICD-10-CM | POA: Insufficient documentation

## 2021-10-28 DIAGNOSIS — Z8659 Personal history of other mental and behavioral disorders: Secondary | ICD-10-CM | POA: Diagnosis not present

## 2021-10-28 DIAGNOSIS — E119 Type 2 diabetes mellitus without complications: Secondary | ICD-10-CM | POA: Diagnosis not present

## 2021-10-28 DIAGNOSIS — Z79899 Other long term (current) drug therapy: Secondary | ICD-10-CM | POA: Insufficient documentation

## 2021-10-28 LAB — CBC WITH DIFFERENTIAL/PLATELET
Abs Immature Granulocytes: 0.02 10*3/uL (ref 0.00–0.07)
Basophils Absolute: 0.1 10*3/uL (ref 0.0–0.1)
Basophils Relative: 1 %
Eosinophils Absolute: 0.2 10*3/uL (ref 0.0–0.5)
Eosinophils Relative: 2 %
HCT: 38.4 % — ABNORMAL LOW (ref 39.0–52.0)
Hemoglobin: 12.9 g/dL — ABNORMAL LOW (ref 13.0–17.0)
Immature Granulocytes: 0 %
Lymphocytes Relative: 31 %
Lymphs Abs: 3.7 10*3/uL (ref 0.7–4.0)
MCH: 30 pg (ref 26.0–34.0)
MCHC: 33.6 g/dL (ref 30.0–36.0)
MCV: 89.3 fL (ref 80.0–100.0)
Monocytes Absolute: 1.3 10*3/uL — ABNORMAL HIGH (ref 0.1–1.0)
Monocytes Relative: 11 %
Neutro Abs: 6.5 10*3/uL (ref 1.7–7.7)
Neutrophils Relative %: 55 %
Platelets: 323 10*3/uL (ref 150–400)
RBC: 4.3 MIL/uL (ref 4.22–5.81)
RDW: 14.3 % (ref 11.5–15.5)
WBC: 11.8 10*3/uL — ABNORMAL HIGH (ref 4.0–10.5)
nRBC: 0 % (ref 0.0–0.2)

## 2021-10-28 LAB — URINALYSIS, ROUTINE W REFLEX MICROSCOPIC
Bacteria, UA: NONE SEEN
Bilirubin Urine: NEGATIVE
Glucose, UA: NEGATIVE mg/dL
Hgb urine dipstick: NEGATIVE
Ketones, ur: 20 mg/dL — AB
Leukocytes,Ua: NEGATIVE
Nitrite: NEGATIVE
Protein, ur: NEGATIVE mg/dL
Specific Gravity, Urine: 1.014 (ref 1.005–1.030)
pH: 7 (ref 5.0–8.0)

## 2021-10-28 NOTE — ED Notes (Signed)
Pt provided perineal care, new brief applied, repositioned to comfort. 

## 2021-10-28 NOTE — ED Provider Triage Note (Cosign Needed Addendum)
Emergency Medicine Provider Triage Evaluation Note  Brent Young , a 61 y.o. male  was evaluated in triage.  Pt complains of possible infection.  Pt is total care.  Pt sent from Nursing facility  Review of Systems  Positive:  Negative:   Physical Exam  BP (!) 162/75   Pulse 79   Temp 98.9 F (37.2 C) (Oral)   Resp 16   SpO2 98%  Gen:   Awake, no distress   Resp:  Normal effort  MSK:    Other:    Medical Decision Making  Medically screening exam initiated at 7:08 PM.  Appropriate orders placed.  JULIE PAOLINI was informed that the remainder of the evaluation will be completed by another provider, this initial triage assessment does not replace that evaluation, and the importance of remaining in the ED until their evaluation is complete.     Elson Areas, PA-C 10/28/21 1909    Elson Areas, PA-C 10/28/21 [phone]

## 2021-10-28 NOTE — ED Provider Notes (Signed)
[facility]-EMERGENCY DEPT Provider Note   CSN: [id number] Arrival date & time: 10/28/21  1805     History  Chief Complaint  Patient presents with   Weakness    Brent Young is a 61 y.o. male.  Patient presents to the hospital via EMS from [facility].  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  Patient states he was sent to the hospital due to a possible urinary tract infection.  Patient states he has no complaints at this time.  Patient and abdominal pain, dysuria, hematuria, nausea, vomiting, fever, weakness, shortness of breath, chest pain.  Patient with history of type 2 diabetes, hypertension, history of stroke, leukocytosis  HPI     Home Medications Prior to Admission medications   Medication Sig Start Date End Date Taking? Authorizing Provider  acetaminophen (TYLENOL) 650 MG suppository Place 1 suppository (650 mg total) rectally every 4 (four) hours as needed for mild pain (or temp > 37.5 C (99.5 F)). 03/24/21   Love, Evlyn Kanner, PA-C  albuterol (PROVENTIL) (2.5 MG/3ML) 0.083% nebulizer solution Take 3 mLs (2.5 mg total) by nebulization every 6 (six) hours as needed for wheezing or shortness of breath. 04/01/21   Angiulli, Mcarthur Rossetti, PA-C  amLODipine (NORVASC) 10 MG tablet Take 1 tablet (10 mg total) by mouth daily. 05/28/20   Grayce Sessions, NP  aspirin EC 325 MG EC tablet Take 1 tablet (325 mg total) by mouth daily. 04/02/21   Angiulli, Mcarthur Rossetti, PA-C  atorvastatin (LIPITOR) 40 MG tablet Take 1 tablet (40 mg total) by mouth daily. 04/02/21   Angiulli, Mcarthur Rossetti, PA-C  diclofenac Sodium (VOLTAREN) 1 % GEL Apply 2 g topically 4 (four) times daily. 04/02/21   Angiulli, Mcarthur Rossetti, PA-C  fluticasone (FLONASE) 50 MCG/ACT nasal spray Place 2 sprays into both nostrils daily. 04/02/21   Angiulli, Mcarthur Rossetti, PA-C  gabapentin (NEURONTIN) 300 MG capsule TAKE 1 CAPSULE BY MOUTH THREE TIMES A  DAY Patient taking differently: Take 300 mg by mouth 3 (three) times daily. 12/16/20   Grayce Sessions, NP  hydrOXYzine (ATARAX) 10 MG tablet Take 1 tablet (10 mg total) by mouth 3 (three) times daily as needed for anxiety. 04/01/21   Angiulli, Mcarthur Rossetti, PA-C  irbesartan (AVAPRO) 75 MG tablet Take 1 tablet (75 mg total) by mouth daily. 04/02/21   Angiulli, Mcarthur Rossetti, PA-C  linagliptin (TRADJENTA) 5 MG TABS tablet Take 1 tablet (5 mg total) by mouth daily. 04/02/21   Angiulli, Mcarthur Rossetti, PA-C  metFORMIN (GLUCOPHAGE) 1000 MG tablet Take 1 tablet (1,000 mg total) by mouth 2 (two) times daily with a meal. 05/28/20   Grayce Sessions, NP  metoprolol succinate (TOPROL-XL) 25 MG 24 hr tablet Take 1 tablet (25 mg total) by mouth daily. 04/02/21   Angiulli, Mcarthur Rossetti, PA-C  mometasone-formoterol (DULERA) 100-5 MCG/ACT AERO Inhale 2 puffs into the lungs 2 (two) times daily. 04/01/21   Angiulli, Mcarthur Rossetti, PA-C  Oxcarbazepine (TRILEPTAL) 300 MG tablet Take 1 tablet (300 mg total) by mouth 3 (three) times daily. 02/04/20   Grayce Sessions, NP  pantoprazole (PROTONIX) 40 MG tablet Take 1 tablet (40 mg total) by mouth daily. 04/02/21   Angiulli, Mcarthur Rossetti, PA-C      Allergies    Patient has no known allergies.    Review of Systems   Review of Systems  Constitutional:  Negative for fever.  Respiratory:  Negative for shortness of breath.   Cardiovascular:  Negative for chest pain.  Gastrointestinal:  Negative for abdominal pain, diarrhea, nausea and vomiting.  Genitourinary:  Negative for dysuria, flank pain and hematuria.  Neurological:  Negative for weakness.    Physical Exam Updated Vital Signs BP (!) 162/75   Pulse 79   Temp 98.9 F (37.2 C) (Oral)   Resp 16   SpO2 98%  Physical Exam Vitals and nursing note reviewed.  Constitutional:      General: He is not in acute distress. HENT:     Head: Normocephalic and atraumatic.     Mouth/Throat:     Mouth: Mucous membranes are moist.  Eyes:      Conjunctiva/sclera: Conjunctivae normal.  Cardiovascular:     Rate and Rhythm: Normal rate and regular rhythm.     Pulses: Normal pulses.     Heart sounds: Normal heart sounds.  Pulmonary:     Effort: Pulmonary effort is normal.     Breath sounds: Normal breath sounds.  Abdominal:     Palpations: Abdomen is soft.     Tenderness: There is no abdominal tenderness.  Musculoskeletal:        General: Normal range of motion.     Cervical back: Normal range of motion and neck supple.  Skin:    General: Skin is warm and dry.     Capillary Refill: Capillary refill takes less than 2 seconds.  Neurological:     Mental Status: He is alert. Mental status is at baseline.     ED Results / Procedures / Treatments   Labs (all labs ordered are listed, but only abnormal results are displayed) Labs Reviewed  CBC WITH DIFFERENTIAL/PLATELET  COMPREHENSIVE METABOLIC PANEL  URINALYSIS, ROUTINE W REFLEX MICROSCOPIC    EKG None  Radiology No results found.  Procedures Procedures    Medications Ordered in ED Medications - No data to display  ED Course/ Medical Decision Making/ A&P                           Medical Decision Making  Patient presents with no complaints at this time.  Workup started to look for any sign of infection with CBC, CMP, and urinalysis.  I reviewed the patient's medical history and see neurology visits related to infarction of right basal ganglia  I reviewed lab results.  Urinalysis with 20 ketones but no signs of infection.  Patient's metabolic panel and CBC are pending.  Patient has difficult venous access.  RT orders placed for lab draw  Review of patient's paperwork shows that he is being treated for a urinary tract infection with gentamicin at this time.  Patient's urine appears clean at today's visit.  Plan for probable discharge after basic labs are drawn. Patient may continue therapy as ordered at his facility pending lab results.   Patient care being  transferred to Army Melia, PA-C at shift handoff       Final Clinical Impression(s) / ED Diagnoses Final diagnoses:  None    Rx / DC Orders ED Discharge Orders     None         Pamala Duffel 10/28/21 2211    Glynn Octave, MD 10/29/21 [phone]

## 2021-10-28 NOTE — Discharge Instructions (Signed)
Please continue therapy as prescribed at your facility. Your urine is normal today without signs of infection. Your blood work is normal today.

## 2021-10-28 NOTE — Progress Notes (Signed)
Arterial stick done for labs per md rx.

## 2021-10-28 NOTE — ED Provider Notes (Signed)
61 yo male with history of UTI, treated with gentamycin.  UA normal. Pending lab check.  Physical Exam  BP (!) 162/75   Pulse 79   Temp 98.9 F (37.2 C) (Oral)   Resp 16   SpO2 98%   Physical Exam  Procedures  Procedures  ED Course / MDM    Medical Decision Making Amount and/or Complexity of Data Reviewed Labs: ordered.  Risk Prescription drug management.   Labs normal, patient feeling well. Patient discharged back to facility.        Jeannie Fend, PA-C 10/29/21 Marlyce Huge    Glynn Octave, MD 10/29/21 [phone]

## 2021-10-28 NOTE — ED Notes (Signed)
Rancour at bedside. 

## 2021-10-28 NOTE — ED Notes (Addendum)
Unsuccessful [place] PIV x3 attempts. Notified PA-C

## 2021-10-28 NOTE — ED Triage Notes (Signed)
Per EMS- Patient is a resident of [facility] and rehab. Patient is a hemiplegia  from a previous stroke. Patient needs full assist and uses a hoyer for standing or moving.

## 2021-10-29 ENCOUNTER — Other Ambulatory Visit: Payer: Self-pay

## 2021-10-29 LAB — COMPREHENSIVE METABOLIC PANEL
ALT: 14 U/L (ref 0–44)
AST: 13 U/L — ABNORMAL LOW (ref 15–41)
Albumin: 4.4 g/dL (ref 3.5–5.0)
Alkaline Phosphatase: 108 U/L (ref 38–126)
Anion gap: 13 (ref 5–15)
BUN: 18 mg/dL (ref 8–23)
CO2: 28 mmol/L (ref 22–32)
Calcium: 9.3 mg/dL (ref 8.9–10.3)
Chloride: 100 mmol/L (ref 98–111)
Creatinine, Ser: 0.86 mg/dL (ref 0.61–1.24)
GFR, Estimated: >60 mL/min (ref >60)
Glucose, Bld: 81 mg/dL (ref 70–99)
Potassium: 3.7 mmol/L (ref 3.5–5.1)
Sodium: 141 mmol/L (ref 135–145)
Total Bilirubin: 0.7 mg/dL (ref 0.3–1.2)
Total Protein: 8.2 g/dL — ABNORMAL HIGH (ref 6.5–8.1)

## 2021-10-29 MED ORDER — LIDOCAINE VISCOUS HCL 2 % MT SOLN
15.0000 mL | Freq: Once | OROMUCOSAL | Status: AC
  Administered 2021-10-29: 15 mL via OROMUCOSAL
  Filled 2021-10-29: qty 15

## 2021-10-29 MED ORDER — BENZONATATE 200 MG PO CAPS
200.0000 mg | ORAL_CAPSULE | Freq: Three times a day (TID) | ORAL | 0 refills | Status: DC
  Filled 2021-10-29: qty 30, 10d supply, fill #0

## 2021-10-29 MED ORDER — FLUTICASONE PROPIONATE 50 MCG/ACT NA SUSP
1.0000 | Freq: Every day | NASAL | 2 refills | Status: DC
  Filled 2021-10-29: qty 16, 30d supply, fill #0

## 2021-10-29 NOTE — ED Notes (Signed)
[facility] Called for transport 

## 2021-10-29 NOTE — ED Notes (Signed)
Attempted to call report to [facility] with no answer x2.

## 2021-10-30 LAB — URINE CULTURE: Culture: NO GROWTH

## 2021-10-31 ENCOUNTER — Emergency Department (HOSPITAL_COMMUNITY): Payer: Medicaid Other

## 2021-10-31 ENCOUNTER — Encounter (HOSPITAL_COMMUNITY): Payer: Self-pay

## 2021-10-31 ENCOUNTER — Inpatient Hospital Stay (HOSPITAL_COMMUNITY): Payer: Medicaid Other

## 2021-10-31 ENCOUNTER — Other Ambulatory Visit: Payer: Self-pay

## 2021-10-31 ENCOUNTER — Inpatient Hospital Stay (HOSPITAL_COMMUNITY)
Admission: EM | Admit: 2021-10-31 | Discharge: 2021-11-06 | DRG: 065 | Disposition: A | Discharge status: Home Health | Payer: Medicaid Other | Source: Skilled Nursing Facility | Attending: Internal Medicine | Admitting: Internal Medicine

## 2021-10-31 DIAGNOSIS — N179 Acute kidney failure, unspecified: Secondary | ICD-10-CM | POA: Diagnosis present

## 2021-10-31 DIAGNOSIS — Z79899 Other long term (current) drug therapy: Secondary | ICD-10-CM | POA: Diagnosis not present

## 2021-10-31 DIAGNOSIS — R7989 Other specified abnormal findings of blood chemistry: Secondary | ICD-10-CM | POA: Diagnosis present

## 2021-10-31 DIAGNOSIS — G4700 Insomnia, unspecified: Secondary | ICD-10-CM | POA: Diagnosis not present

## 2021-10-31 DIAGNOSIS — I69354 Hemiplegia and hemiparesis following cerebral infarction affecting left non-dominant side: Secondary | ICD-10-CM | POA: Diagnosis not present

## 2021-10-31 DIAGNOSIS — I10 Essential (primary) hypertension: Secondary | ICD-10-CM | POA: Diagnosis present

## 2021-10-31 DIAGNOSIS — Z760 Encounter for issue of repeat prescription: Secondary | ICD-10-CM

## 2021-10-31 DIAGNOSIS — E782 Mixed hyperlipidemia: Secondary | ICD-10-CM | POA: Diagnosis not present

## 2021-10-31 DIAGNOSIS — D72829 Elevated white blood cell count, unspecified: Secondary | ICD-10-CM | POA: Diagnosis not present

## 2021-10-31 DIAGNOSIS — I6389 Other cerebral infarction: Secondary | ICD-10-CM | POA: Diagnosis not present

## 2021-10-31 DIAGNOSIS — I63541 Cerebral infarction due to unspecified occlusion or stenosis of right cerebellar artery: Principal | ICD-10-CM | POA: Diagnosis present

## 2021-10-31 DIAGNOSIS — Z87440 Personal history of urinary (tract) infections: Secondary | ICD-10-CM

## 2021-10-31 DIAGNOSIS — R5381 Other malaise: Secondary | ICD-10-CM | POA: Diagnosis present

## 2021-10-31 DIAGNOSIS — E1169 Type 2 diabetes mellitus with other specified complication: Secondary | ICD-10-CM | POA: Diagnosis not present

## 2021-10-31 DIAGNOSIS — Z7409 Other reduced mobility: Secondary | ICD-10-CM | POA: Diagnosis not present

## 2021-10-31 DIAGNOSIS — G9349 Other encephalopathy: Secondary | ICD-10-CM | POA: Diagnosis present

## 2021-10-31 DIAGNOSIS — E669 Obesity, unspecified: Secondary | ICD-10-CM | POA: Diagnosis present

## 2021-10-31 DIAGNOSIS — Z8616 Personal history of COVID-19: Secondary | ICD-10-CM | POA: Diagnosis not present

## 2021-10-31 DIAGNOSIS — E785 Hyperlipidemia, unspecified: Secondary | ICD-10-CM | POA: Diagnosis present

## 2021-10-31 DIAGNOSIS — I639 Cerebral infarction, unspecified: Secondary | ICD-10-CM

## 2021-10-31 DIAGNOSIS — Z6833 Body mass index (BMI) 33.0-33.9, adult: Secondary | ICD-10-CM | POA: Diagnosis not present

## 2021-10-31 DIAGNOSIS — I248 Other forms of acute ischemic heart disease: Secondary | ICD-10-CM | POA: Diagnosis present

## 2021-10-31 DIAGNOSIS — I252 Old myocardial infarction: Secondary | ICD-10-CM | POA: Diagnosis not present

## 2021-10-31 DIAGNOSIS — W19XXXA Unspecified fall, initial encounter: Principal | ICD-10-CM

## 2021-10-31 DIAGNOSIS — Z7982 Long term (current) use of aspirin: Secondary | ICD-10-CM

## 2021-10-31 DIAGNOSIS — I2510 Atherosclerotic heart disease of native coronary artery without angina pectoris: Secondary | ICD-10-CM | POA: Diagnosis present

## 2021-10-31 DIAGNOSIS — E0821 Diabetes mellitus due to underlying condition with diabetic nephropathy: Secondary | ICD-10-CM

## 2021-10-31 DIAGNOSIS — R2689 Other abnormalities of gait and mobility: Secondary | ICD-10-CM | POA: Insufficient documentation

## 2021-10-31 DIAGNOSIS — I6340 Cerebral infarction due to embolism of unspecified cerebral artery: Secondary | ICD-10-CM | POA: Diagnosis present

## 2021-10-31 DIAGNOSIS — Z7951 Long term (current) use of inhaled steroids: Secondary | ICD-10-CM

## 2021-10-31 DIAGNOSIS — E119 Type 2 diabetes mellitus without complications: Secondary | ICD-10-CM | POA: Diagnosis present

## 2021-10-31 DIAGNOSIS — R69 Illness, unspecified: Secondary | ICD-10-CM

## 2021-10-31 DIAGNOSIS — Z993 Dependence on wheelchair: Secondary | ICD-10-CM

## 2021-10-31 DIAGNOSIS — R778 Other specified abnormalities of plasma proteins: Secondary | ICD-10-CM | POA: Diagnosis not present

## 2021-10-31 DIAGNOSIS — R29709 NIHSS score 9: Secondary | ICD-10-CM | POA: Diagnosis present

## 2021-10-31 DIAGNOSIS — F17210 Nicotine dependence, cigarettes, uncomplicated: Secondary | ICD-10-CM | POA: Diagnosis present

## 2021-10-31 DIAGNOSIS — G8191 Hemiplegia, unspecified affecting right dominant side: Secondary | ICD-10-CM | POA: Diagnosis present

## 2021-10-31 DIAGNOSIS — N390 Urinary tract infection, site not specified: Secondary | ICD-10-CM | POA: Diagnosis present

## 2021-10-31 DIAGNOSIS — E876 Hypokalemia: Secondary | ICD-10-CM | POA: Diagnosis present

## 2021-10-31 DIAGNOSIS — Z7984 Long term (current) use of oral hypoglycemic drugs: Secondary | ICD-10-CM | POA: Diagnosis not present

## 2021-10-31 DIAGNOSIS — E66811 Obesity, class 1: Secondary | ICD-10-CM | POA: Diagnosis present

## 2021-10-31 LAB — CBC WITH DIFFERENTIAL/PLATELET
Abs Immature Granulocytes: 0.06 10*3/uL (ref 0.00–0.07)
Basophils Absolute: 0.1 10*3/uL (ref 0.0–0.1)
Basophils Relative: 1 %
Eosinophils Absolute: 0.2 10*3/uL (ref 0.0–0.5)
Eosinophils Relative: 2 %
HCT: 39.9 % (ref 39.0–52.0)
Hemoglobin: 13 g/dL (ref 13.0–17.0)
Immature Granulocytes: 1 %
Lymphocytes Relative: 27 %
Lymphs Abs: 3.5 10*3/uL (ref 0.7–4.0)
MCH: 30 pg (ref 26.0–34.0)
MCHC: 32.6 g/dL (ref 30.0–36.0)
MCV: 92.1 fL (ref 80.0–100.0)
Monocytes Absolute: 1.3 10*3/uL — ABNORMAL HIGH (ref 0.1–1.0)
Monocytes Relative: 10 %
Neutro Abs: 7.9 10*3/uL — ABNORMAL HIGH (ref 1.7–7.7)
Neutrophils Relative %: 59 %
Platelets: 297 10*3/uL (ref 150–400)
RBC: 4.33 MIL/uL (ref 4.22–5.81)
RDW: 14.6 % (ref 11.5–15.5)
WBC: 13.1 10*3/uL — ABNORMAL HIGH (ref 4.0–10.5)
nRBC: 0 % (ref 0.0–0.2)

## 2021-10-31 LAB — COMPREHENSIVE METABOLIC PANEL
ALT: 14 U/L (ref 0–44)
AST: 20 U/L (ref 15–41)
Albumin: 4.3 g/dL (ref 3.5–5.0)
Alkaline Phosphatase: 103 U/L (ref 38–126)
Anion gap: 14 (ref 5–15)
BUN: 38 mg/dL — ABNORMAL HIGH (ref 8–23)
CO2: 27 mmol/L (ref 22–32)
Calcium: 8.9 mg/dL (ref 8.9–10.3)
Chloride: 100 mmol/L (ref 98–111)
Creatinine, Ser: 3.61 mg/dL — ABNORMAL HIGH (ref 0.61–1.24)
GFR, Estimated: 18 mL/min — ABNORMAL LOW (ref >60)
Glucose, Bld: 97 mg/dL (ref 70–99)
Potassium: 4.3 mmol/L (ref 3.5–5.1)
Sodium: 141 mmol/L (ref 135–145)
Total Bilirubin: 0.7 mg/dL (ref 0.3–1.2)
Total Protein: 7.8 g/dL (ref 6.5–8.1)

## 2021-10-31 LAB — ECHOCARDIOGRAM COMPLETE
AR max vel: 1.75 cm2
AV Peak grad: 24.6 mmHg
Ao pk vel: 2.48 m/s
Area-P 1/2: 5.7 cm2
Height: 73 in
P 1/2 time: 298 msec
S' Lateral: 1.9 cm
Weight: 4028.25 oz

## 2021-10-31 LAB — HEMOGLOBIN A1C
Hgb A1c MFr Bld: 5.3 % (ref 4.8–5.6)
Mean Plasma Glucose: 105.41 mg/dL

## 2021-10-31 LAB — TROPONIN I (HIGH SENSITIVITY): Troponin I (High Sensitivity): 18 ng/L — ABNORMAL HIGH (ref <18)

## 2021-10-31 LAB — GENTAMICIN LEVEL, RANDOM: Gentamicin Rm: 9.2 ug/mL

## 2021-10-31 MED ORDER — ENOXAPARIN SODIUM 30 MG/0.3ML IJ SOSY
30.0000 mg | PREFILLED_SYRINGE | Freq: Every day | INTRAMUSCULAR | Status: DC
  Administered 2021-10-31 – 2021-11-01 (×2): 30 mg via SUBCUTANEOUS
  Filled 2021-10-31 (×2): qty 0.3

## 2021-10-31 MED ORDER — ACETAMINOPHEN 325 MG PO TABS
650.0000 mg | ORAL_TABLET | Freq: Four times a day (QID) | ORAL | Status: DC | PRN
  Administered 2021-11-03 – 2021-11-04 (×4): 650 mg via ORAL
  Filled 2021-10-31 (×5): qty 2

## 2021-10-31 MED ORDER — LACTATED RINGERS IV SOLN: INTRAVENOUS | Status: DC

## 2021-10-31 MED ORDER — STROKE: EARLY STAGES OF RECOVERY BOOK
Freq: Once | Status: AC
  Filled 2021-10-31: qty 1

## 2021-10-31 MED ORDER — LACTATED RINGERS IV BOLUS
500.0000 mL | Freq: Once | INTRAVENOUS | Status: AC
  Administered 2021-10-31: 500 mL via INTRAVENOUS

## 2021-10-31 MED ORDER — PROCHLORPERAZINE EDISYLATE 10 MG/2ML IJ SOLN: 10.0000 mg | Freq: Four times a day (QID) | INTRAMUSCULAR | Status: DC | PRN

## 2021-10-31 MED ORDER — ACETAMINOPHEN 650 MG RE SUPP: 650.0000 mg | Freq: Four times a day (QID) | RECTAL | Status: DC | PRN

## 2021-10-31 NOTE — ED Provider Notes (Addendum)
[facility] DEPT Provider Note   CSN: [id number] Arrival date & time: 10/31/21  0348     History  Chief Complaint  Patient presents with   Lytle Michaels    EQUAN EISENZIMMER is a 61 y.o. male.  The history is provided by the EMS personnel, the patient and medical records.  Fall  DUVAL WINCH is a 61 y.o. male who presents to the Emergency Department complaining of fall.  Level 5 caveat due to confusion.  History is provided by EMS.  EMS was called out to [facility] for evaluation following a fall.  Per report he had an unwitnessed fall out of bed around 9 PM.  EMS was called due to unequal pupils.  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  Patient denies any complaints.  No complaints of pain, fever, difficulty breathing.  On record review he is currently being treated for UTI with gentamicin.  Patient does report feeling sleepy.  When asked why he is in the emergency department he states that he struck the building.  Attempted to get additional history from patient's nursing facility after his initial ED arrival-his primary nurse is not available for conversation but the report is that he fell out of bed.  Last known well is not available.  Attempted to reach patient's mother and contact listed in the chart-no answer.     Home Medications Prior to Admission medications   Medication Sig Start Date End Date Taking? Authorizing Provider  acetaminophen (TYLENOL) 650 MG suppository Place 1 suppository (650 mg total) rectally every 4 (four) hours as needed for mild pain (or temp > 37.5 C (99.5 F)). 03/24/21   Love, Ivan Anchors, PA-C  albuterol (PROVENTIL) (2.5 MG/3ML) 0.083% nebulizer solution Take 3 mLs (2.5 mg total) by nebulization every 6 (six) hours as needed for wheezing or shortness of breath. 04/01/21   Angiulli, Lavon Paganini, PA-C  amLODipine (NORVASC) 10 MG tablet Take 1 tablet (10 mg total) by mouth daily. 05/28/20   Kerin Perna, NP  aspirin EC 325 MG EC tablet Take 1 tablet (325 mg total) by mouth daily. 04/02/21   Angiulli, Lavon Paganini, PA-C  atorvastatin (LIPITOR) 40 MG tablet Take 1 tablet (40 mg total) by mouth daily. 04/02/21   Angiulli, Lavon Paganini, PA-C  diclofenac Sodium (VOLTAREN) 1 % GEL Apply 2 g topically 4 (four) times daily. 04/02/21   Angiulli, Lavon Paganini, PA-C  fluticasone (FLONASE) 50 MCG/ACT nasal spray Place 1 spray into both nostrils daily. 10/29/21   Tacy Learn, PA-C  gabapentin (NEURONTIN) 300 MG capsule TAKE 1 CAPSULE BY MOUTH THREE TIMES A DAY Patient taking differently: Take 300 mg by mouth 3 (three) times daily. 12/16/20   Kerin Perna, NP  hydrOXYzine (ATARAX) 10 MG tablet Take 1 tablet (10 mg total) by mouth 3 (three) times daily as needed for anxiety. 04/01/21   Angiulli, Lavon Paganini, PA-C  irbesartan (AVAPRO) 75 MG tablet Take 1 tablet (75 mg total) by mouth daily. 04/02/21   Angiulli, Lavon Paganini, PA-C  linagliptin (TRADJENTA) 5 MG TABS tablet Take 1 tablet (5 mg total) by mouth daily. 04/02/21   Angiulli, Lavon Paganini, PA-C  metFORMIN (GLUCOPHAGE) 1000 MG tablet Take 1 tablet (1,000 mg total) by mouth 2 (two) times daily with a meal. 05/28/20   Kerin Perna, NP  metoprolol succinate (TOPROL-XL) 25 MG 24 hr tablet Take 1 tablet (25 mg total) by mouth daily.  04/02/21   Angiulli, Mcarthur Rossetti, PA-C  mometasone-formoterol (DULERA) 100-5 MCG/ACT AERO Inhale 2 puffs into the lungs 2 (two) times daily. 04/01/21   Angiulli, Mcarthur Rossetti, PA-C  Oxcarbazepine (TRILEPTAL) 300 MG tablet Take 1 tablet (300 mg total) by mouth 3 (three) times daily. 02/04/20   Grayce Sessions, NP  pantoprazole (PROTONIX) 40 MG tablet Take 1 tablet (40 mg total) by mouth daily. 04/02/21   Angiulli, Mcarthur Rossetti, PA-C      Allergies    Patient has no known allergies.    Review of Systems   Review of Systems  All other systems reviewed and are negative.   Physical Exam Updated Vital Signs BP (!) 108/58 (BP Location: Left Arm)    Pulse 76   Temp 97.7 F (36.5 C) (Oral)   Resp 16   SpO2 100%  Physical Exam Vitals and nursing note reviewed.  Constitutional:      Appearance: He is well-developed.  HENT:     Head: Normocephalic and atraumatic.     Comments: No external evidence of head trauma Cardiovascular:     Rate and Rhythm: Normal rate and regular rhythm.     Heart sounds: No murmur heard. Pulmonary:     Effort: Pulmonary effort is normal. No respiratory distress.     Breath sounds: Normal breath sounds.  Abdominal:     Palpations: Abdomen is soft.     Tenderness: There is no abdominal tenderness. There is no guarding or rebound.  Musculoskeletal:        General: No tenderness.     Comments: No tenderness to the hips bilaterally.  There is a tegaderm to the left shin without local tenderness.  No tenderness to palpation over shoulders, elbows, hands bilaterally  Skin:    General: Skin is warm and dry.  Neurological:     Mental Status: He is alert.     Comments: Oriented to self.  3 out of 5 strength to bilateral upper extremities with muscle wasting to left upper extremities.  Unable to move the left lower extremity.  Able to lift the right lower extremity off the stretcher.  There is left facial weakness.  Left pupil is sluggish but reactive.  Speech is slow but fluent.  Psychiatric:        Behavior: Behavior normal.     ED Results / Procedures / Treatments   Labs (all labs ordered are listed, but only abnormal results are displayed) Labs Reviewed  COMPREHENSIVE METABOLIC PANEL  CBC WITH DIFFERENTIAL/PLATELET    EKG None  Radiology CT Head Wo Contrast  Result Date: 10/31/2021 CLINICAL DATA:  Unwitnessed fall EXAM: CT HEAD WITHOUT CONTRAST CT CERVICAL SPINE WITHOUT CONTRAST TECHNIQUE: Multidetector CT imaging of the head and cervical spine was performed following the standard protocol without intravenous contrast. Multiplanar CT image reconstructions of the cervical spine were also  generated. RADIATION DOSE REDUCTION: This exam was performed according to the departmental dose-optimization program which includes automated exposure control, adjustment of the mA and/or kV according to patient size and/or use of iterative reconstruction technique. COMPARISON:  03/19/2021 head CT inferior FINDINGS: CT HEAD FINDINGS Brain: No evidence of acute infarction, hemorrhage, hydrocephalus, extra-axial collection or mass lesion/mass effect. Chronic small vessel ischemic gliosis in the cerebral white matter with chronic small vessel infarcts most notable in the right corona radiata and thalamus. Premature brain atrophy. Vascular: No hyperdense vessel or unexpected calcification. Skull: Normal. Negative for fracture or focal lesion. Sinuses/Orbits: No acute finding. CT CERVICAL SPINE FINDINGS  Alignment: Straightening of cervical lordosis. Skull base and vertebrae: No acute fracture. No primary bone lesion or focal pathologic process. Soft tissues and spinal canal: No prevertebral fluid or swelling. No visible canal hematoma. Disc levels: Generalized degenerative disc space narrowing, endplate degeneration, and ridging. Foraminal narrowing greatest at C3-4. Upper chest: No evidence of injury IMPRESSION: 1. No evidence of acute intracranial or cervical spine injury. 2. Notable chronic small vessel ischemia. Electronically Signed   By: Jorje Guild M.D.   On: 10/31/2021 06:10   CT Cervical Spine Wo Contrast  Result Date: 10/31/2021 CLINICAL DATA:  Unwitnessed fall EXAM: CT HEAD WITHOUT CONTRAST CT CERVICAL SPINE WITHOUT CONTRAST TECHNIQUE: Multidetector CT imaging of the head and cervical spine was performed following the standard protocol without intravenous contrast. Multiplanar CT image reconstructions of the cervical spine were also generated. RADIATION DOSE REDUCTION: This exam was performed according to the departmental dose-optimization program which includes automated exposure control, adjustment  of the mA and/or kV according to patient size and/or use of iterative reconstruction technique. COMPARISON:  03/19/2021 head CT inferior FINDINGS: CT HEAD FINDINGS Brain: No evidence of acute infarction, hemorrhage, hydrocephalus, extra-axial collection or mass lesion/mass effect. Chronic small vessel ischemic gliosis in the cerebral white matter with chronic small vessel infarcts most notable in the right corona radiata and thalamus. Premature brain atrophy. Vascular: No hyperdense vessel or unexpected calcification. Skull: Normal. Negative for fracture or focal lesion. Sinuses/Orbits: No acute finding. CT CERVICAL SPINE FINDINGS Alignment: Straightening of cervical lordosis. Skull base and vertebrae: No acute fracture. No primary bone lesion or focal pathologic process. Soft tissues and spinal canal: No prevertebral fluid or swelling. No visible canal hematoma. Disc levels: Generalized degenerative disc space narrowing, endplate degeneration, and ridging. Foraminal narrowing greatest at C3-4. Upper chest: No evidence of injury IMPRESSION: 1. No evidence of acute intracranial or cervical spine injury. 2. Notable chronic small vessel ischemia. Electronically Signed   By: Jorje Guild M.D.   On: 10/31/2021 06:10   DG Chest 1 View  Result Date: 10/31/2021 CLINICAL DATA:  Unwitnessed fall . EXAM: CHEST  1 VIEW COMPARISON:  10/28/2021 FINDINGS: Mild left basilar atelectasis. Lungs are otherwise clear. No pneumothorax or pleural effusion. Cardiac size within normal limits. No acute bone abnormality IMPRESSION: No active disease. Electronically Signed   By: Fidela Salisbury M.D.   On: 10/31/2021 04:41    Procedures Procedures    Medications Ordered in ED Medications - No data to display  ED Course/ Medical Decision Making/ A&P                           Medical Decision Making Amount and/or Complexity of Data Reviewed Labs: ordered. Radiology: ordered.   Patient here for evaluation of following  unwitnessed fall.  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  He does appear to be weak in his right upper extremity, no evidence of trauma on examination.  CT head and C-spine are negative for acute abnormality.  Unable to reach staff member that is familiar with the patient and unable to reach family, therefore unable to ascertain if this right upper extremity weakness is acute or chronic.  Will obtain MRI to rule out recurrent CVA.  Patient care transferred pending MRI and labs.        Final Clinical Impression(s) / ED Diagnoses Final diagnoses:  Fall, initial encounter    Rx / DC Orders ED Discharge Orders  None         Tilden Fossa, MD 10/31/21 0630    Tilden Fossa, MD 10/31/21 [phone]

## 2021-10-31 NOTE — ED Notes (Signed)
Called lab to add on troponin  

## 2021-10-31 NOTE — Progress Notes (Signed)
Carotid artery duplex has been completed. Preliminary results can be found in CV Proc through chart review.   10/31/21 11:20 AM Olen Cordial RVT

## 2021-10-31 NOTE — ED Notes (Signed)
Patient transported to MRI. Will start fluids once patient returns.

## 2021-10-31 NOTE — Progress Notes (Addendum)
Patient arrived from [facility] via [facility] transport;oriented to room and unit routiine; patient is awake; alert to self and situation. Could potentially pass swallow eval. Will report to night RN to reaccess; he is fully awake. Safety reviewed and bed alarm set. MD's alerted to patient arrival via page.

## 2021-10-31 NOTE — Consult Note (Addendum)
Neurology Consultation  Reason for Consult: Stroke vs MS Referring Physician: Dr. Olevia Bowens  CC: Weakness  History is obtained from:Patient, chart review  HPI: Brent Young is a 61 y.o. 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 Patient states today that "[place] and [place] and then a TV case hit me" and when asked how he is feeling he states "sleepy".  It was reported by EMS that patient was at his baseline post fall and that since his previous stroke at baseline his pupils are unequal. He is also currently undergoing treatment for a UTI and was receiving gentamicin (9.2). He was seen in the ED on 8/8 for concern about his UTI not improving and discharged back to [facility] on 8/9. At baseline he is oriented and has full strength of the right upper and lower extremities. He does have residual weakness of his left upper and lower extremities. He is wheelchair bound at baseline. MRS at his current baseline is 4. WBC is 13.1, creatinine is elevated from 0.86 to 3.61 and GFR was >60 and is now 18.   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   ROS: Unable to obtain due to altered mental status.   Past Medical History:  Diagnosis Date   Allergy    Arthritis    Diabetes mellitus without complication ([place])    Hypertension    Stroke [facility])     Family History  Problem Relation Age of Onset   Colon cancer Maternal Grandmother    Esophageal cancer Neg Hx    Stomach cancer Neg Hx    Rectal cancer Neg Hx     Social History:   reports that he has been smoking cigarettes. He has never used smokeless tobacco. He reports that he does not currently use alcohol. He reports that he does not  currently use drugs after having used the following drugs: Marijuana.  Medications  Current Facility-Administered Medications:    [START ON 11/01/2021]  stroke: early stages of recovery book, , Does not apply, Once, Reubin Milan, MD   acetaminophen (TYLENOL) tablet 650 mg, 650 mg, Oral, Q6H PRN **OR** acetaminophen (TYLENOL) suppository 650 mg, 650 mg, Rectal, Q6H PRN, Reubin Milan, MD   enoxaparin (LOVENOX) injection 30 mg, 30 mg, Subcutaneous, Daily, Reubin Milan, MD, 30 mg at 10/31/21 1133   lactated ringers infusion, , Intravenous, Continuous, Charlesetta Shanks, MD, Last Rate: 125 mL/hr at 10/31/21 1002, New Bag at 10/31/21 1002   prochlorperazine (COMPAZINE) injection 10 mg, 10 mg, Intravenous, Q6H PRN, Reubin Milan, MD  Exam: Current vital signs: BP (!) 152/76 (BP Location: Left Arm)   Pulse 91   Temp 98.5 F (36.9 C) (Oral)   Resp 19   Ht 6' 1"  (1.854 m)   Wt 114.2 kg   SpO2 100%   BMI 33.22 kg/m  Vital signs in last 24 hours: Temp:  [97.7 F (36.5 C)-98.5 F (36.9 C)] 98.5 F (36.9 C) (08/11 1812) Pulse Rate:  [76-95] 91 (08/11 1812) Resp:  [10-20] 19 (08/11 1812) BP: (105-152)/(58-124) 152/76 (08/11 1812) SpO2:  [89 %-100 %] 100 % (08/11 1812) Weight:  [114.2 kg] 114.2 kg (08/11 1059)  GENERAL: Awake,  alert in NAD HEENT: - Normocephalic and atraumatic, dry mm, no LN++, no Thyromegally LUNGS - Clear to auscultation bilaterally with no wheezes CV - S1S2 RRR, no m/r/g, equal pulses bilaterally. ABDOMEN - Soft, nontender, nondistended with normoactive BS Ext: warm, well perfused, intact peripheral pulses,  edema  NEURO:  Mental Status: Awake, alert and oriented to person and place, disoriented to time and situation.  Language: speech is clear.  Naming and repetition intact. Some difficulty with word finding. No gross aphasia noted in the context of the patient's AMS and difficulty following commands.  Cranial Nerves: PERRL, EOM demonstrate  saccadic dysmetria bilaterally, uncooperative for visual field test- inconsistent in testing, no facial asymmetry, facial sensation intact, hearing intact, tongue/uvula/soft palate midline, head is midline. No evidence of tongue atrophy or fasciculations Motor:  RUE 2/5 LUE 3/5 R LE 4/5 LLE 0/5 Tone: is increased, bulk is decreased in forearms Sensation- Intact to light touch bilaterally Coordination: FTN intact bilaterally, no ataxia in BLE. Gait- deferred   Labs I have reviewed labs in epic and the results pertinent to this consultation are:  CBC    Component Value Date/Time   WBC 13.1 (H) 10/31/2021 0623   RBC 4.33 10/31/2021 0623   HGB 13.0 10/31/2021 0623   HGB 16.9 04/16/2020 0954   HCT 39.9 10/31/2021 0623   HCT 51.5 (H) 04/16/2020 0954   PLT 297 10/31/2021 0623   PLT 239 04/16/2020 0954   MCV 92.1 10/31/2021 0623   MCV 94 04/16/2020 0954   MCH 30.0 10/31/2021 0623   MCHC 32.6 10/31/2021 0623   RDW 14.6 10/31/2021 0623   RDW 13.6 04/16/2020 0954   LYMPHSABS 3.5 10/31/2021 0623   LYMPHSABS 2.4 04/16/2020 0954   MONOABS 1.3 (H) 10/31/2021 0623   EOSABS 0.2 10/31/2021 0623   EOSABS 0.1 04/16/2020 0954   BASOSABS 0.1 10/31/2021 0623   BASOSABS 0.1 04/16/2020 0954    CMP     Component Value Date/Time   NA 141 10/31/2021 0623   NA 136 04/16/2020 0954   K 4.3 10/31/2021 0623   CL 100 10/31/2021 0623   CO2 27 10/31/2021 0623   GLUCOSE 97 10/31/2021 0623   BUN 38 (H) 10/31/2021 0623   BUN 10 04/16/2020 0954   CREATININE 3.61 (H) 10/31/2021 0623   CALCIUM 8.9 10/31/2021 0623   PROT 7.8 10/31/2021 0623   PROT 8.6 (H) 04/16/2020 0954   ALBUMIN 4.3 10/31/2021 0623   ALBUMIN 4.7 04/16/2020 0954   AST 20 10/31/2021 0623   ALT 14 10/31/2021 0623   ALKPHOS 103 10/31/2021 0623   BILITOT 0.7 10/31/2021 0623   BILITOT 1.4 (H) 04/16/2020 0954   GFRNONAA 18 (L) 10/31/2021 0623   GFRAA 112 04/16/2020 0954    Lipid Panel     Component Value Date/Time   CHOL 148  02/17/2021 0328   CHOL 141 10/08/2020 1140   TRIG 120 02/17/2021 0328   HDL 26 (L) 02/17/2021 0328   HDL 43 10/08/2020 1140   CHOLHDL 5.7 02/17/2021 0328   VLDL 24 02/17/2021 0328   LDLCALC 98 02/17/2021 0328   LDLCALC 75 10/08/2020 1140     Imaging I have reviewed the images obtained:  CT head and C-spine- No evidence of acute intracranial or cervical spine injury. Notable chronic small vessel ischemia.  MRI brain without contrast - Several acute and more subacute small infarcts involving different vascular distributions bilaterally. The findings on personal review of the images by Neurology also appear compatible with acute demyelination.  Assessment: 61 y.o. 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  - MRI brain without contrast - Several acute and more subacute small infarcts involving different vascular distributions bilaterally. The findings on personal review of the images by Neurology also appear compatible with acute demyelination.  - Differential diagnoses: - DDx for new lesions seen on MRI: Acute multifocal ischemic strokes versus multiple sclerosis exacerbation in the setting of possible undiagnosed MS.  - Regarding his AMS, DDx includes worsening encephalopathy due to acute kidney injury versus toxic/metabolic etiologies. Home medications include trazodone and oxcarbazepine, and gentamicin (gentamicin does have potential side effect of headache). Creatinine on 8/8 was 0.86 and it is now 3.61. Possible acute increases in serum concentrations of medications with sedating or anticholinergic side effects is a significant component of the DDx given his acutely  worsened renal function.  - Due to renal failure we are unable to obtain MR brain with contrast, there is concern for demyelination on MR w/o contrast.   Recommendations: - MRI of cervical spine without contrast to assess for possible spinal cord lesions (ordered) - Ideally need add-on MRI with contrast to assess for possible enhancement of the acute DWI images, but given impaired kidney function this would be contraindicated. If eGFR improves to above 30 then per guidelines a post-contrast imaging study would then be safe.   - Optimize kidney function, recommend nephrology consult - complete stroke workup - HgbA1c, fasting lipid panel - Frequent neuro checks - Echocardiogram - Carotid dopplers - Risk factor modification - Telemetry monitoring - PT consult, OT consult, Speech consult - Stroke Team to follow in the AM  -- Patient seen and examined by NP/APP with MD.  Janine Ores, DNP, FNP-BC [facility] Pager: [phone]  I have seen and examined the patient. I have formulated the assessment and recommendations. 61 year old male with AMS and exam revealing multiple abnormalities that correspond to different localizations along the neuraxis. Imaging findings suggest multiple DWI lesions suggestive of either acute and more subacute small infarcts involving different vascular distributions bilaterally versus acute demyelination. Recommendations as above.  Electronically signed: Dr. Kerney Elbe

## 2021-10-31 NOTE — Progress Notes (Signed)
SLP Cancellation Note  Patient Details Name: Brent Young MRN: [id number] DOB: 19-Mar-1961   Cancelled treatment:       Reason Eval/Treat Not Completed: Patient at procedure or test/unavailable  Attempted SLE, however, pt is currently having U/S in room. Will continue efforts. RN aware.  Danira Nylander B. Murvin Natal, [facility], CCC-SLP Speech Language Pathologist Office: [phone]  Leigh Aurora 10/31/2021, 11:36 AM

## 2021-10-31 NOTE — ED Notes (Signed)
[facility] called for patient 

## 2021-10-31 NOTE — ED Notes (Signed)
Pt returned from CT °

## 2021-10-31 NOTE — Evaluation (Addendum)
Speech Language Pathology Evaluation Patient Details Name: Brent Young MRN: [id number] DOB: [birth year] Today's Date: 10/31/2021 Time: 3016-0109 SLP Time Calculation (min) (ACUTE ONLY): 20 min  Problem List:  Patient Active Problem List   Diagnosis Date Noted   Acute CVA (cerebrovascular accident) (HCC) 10/31/2021   Impaired gait and mobility 10/31/2021   Impaired functional mobility, balance, gait, and endurance 10/31/2021   AKI (acute kidney injury) (HCC) 10/31/2021   Class 1 obesity 10/31/2021   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   Shoulder pain, right 02/19/2021   Demand ischemia (HCC) 02/19/2021   Hyperlipidemia, unspecified 02/19/2021   Hypokalemia 02/19/2021   Leukocytosis 02/19/2021   Obesity, Class II, BMI 35-39.9 02/19/2021   Stroke (HCC) 02/17/2021   Essential hypertension 02/17/2021   Diabetes mellitus type 2 in obese (HCC) 02/17/2021   Elevated troponin 02/17/2021   Hypertensive urgency 02/29/2020   Facial droop 02/28/2020   Anemia 02/28/2020   Closed nondisplaced fracture of greater tuberosity of right humerus 01/11/2020   Acute respiratory failure with hypoxia (HCC) 01/11/2020   ASCVD (arteriosclerotic cardiovascular disease) 09/14/2018   History of 2019 novel coronavirus disease (COVID-19) 08/22/2018   Class 2 severe obesity due to excess calories with serious comorbidity and body mass index (BMI) of 35.0 to 35.9 in adult [facility]) 08/22/2018   Past Medical History:  Past Medical History:  Diagnosis Date   Allergy    Arthritis    Diabetes mellitus without complication (HCC)    Hypertension    Stroke [facility])    Past Surgical History:  Past Surgical History:  Procedure Laterality Date   FRACTURE SURGERY Left    Patient fractured left arm   IR ANGIO INTRA EXTRACRAN SEL COM CAROTID INNOMINATE UNI L MOD SED  02/19/2021   IR ANGIO INTRA EXTRACRAN SEL INTERNAL CAROTID UNI R MOD SED  02/19/2021   IR ANGIO VERTEBRAL SEL SUBCLAVIAN INNOMINATE UNI  R MOD SED  02/19/2021   IR ANGIO VERTEBRAL SEL VERTEBRAL UNI L MOD SED  02/19/2021   IR US GUIDE VASC ACCESS RIGHT  02/19/2021   HPI: 61yo male admitted 10/31/21 after a fall. 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 Pt has been seen by ST services in acute, CIR and outpatient venues in February, March,  November and December 2022     Assessment / Plan / Recommendation Clinical Impression  Pt seen at bedside for assessment of cognitive linguistic function. Pt was awake and alert, resting in bed. No family present. During initial conversation with pt, he indicated that he lives alone, manages finances and medications independently, and still drives. He indicated his wife, who was a Doctor, general practice, is now deceased, and that they both have a PhD.   The Mini-Mental State Examination was then administered. Pt was unable to answer temporal orientation questions. He reported the current place as [place] nursing facility. Pt was accurate for city and state. Pt demonstrated immediate recall of 3/3 unrelated words, and recalled 1/3 after a delay. He was unable to spell WORLD backwards. Pt was able to name objects, repeat a phrase, follow a 1-step command, and follow a written instruction. Writing subtests were not given at this time, due to upper extremity weakness. Pt scored 12/28 (n= 25+/30), indicating significant cognitive impairment.   Baseline level of cognitive linguistic function is unknown at this time. Evaluation on CIR 02/28/21 indicated completion of an "informal cognitive evaluation, and all  cognitive domains appear [facility] for tasks assessed".    SLP Assessment  SLP Recommendation/Assessment: Patient needs continued Speech Language Pathology Services  SLP Visit Diagnosis: Cognitive communication deficit (R41.841)    Recommendations for follow up therapy are one component  of a multi-disciplinary discharge planning process, led by the attending physician.  Recommendations may be updated based on patient status, additional functional criteria and insurance authorization.    Follow Up Recommendations  Skilled nursing-short term rehab (<3 hours/day)    Assistance Recommended at Discharge  Frequent or constant Supervision/Assistance  Functional Status Assessment Patient has had a recent decline in their functional status and/or demonstrates limited ability to make significant improvements in function in a reasonable and predictable amount of time  Frequency and Duration min 1 x/week  1 week      SLP Evaluation Cognition  Overall Cognitive Status: No family/caregiver present to determine baseline cognitive functioning Arousal/Alertness: Awake/alert Orientation Level: Oriented to person;Oriented to situation;Disoriented to time;Disoriented to place       Comprehension  Auditory Comprehension Overall Auditory Comprehension: Appears within functional limits for tasks assessed    Expression Expression Primary Mode of Expression: Verbal Verbal Expression Overall Verbal Expression: Appears within functional limits for tasks assessed Written Expression Dominant Hand: Right   Oral / Motor  Motor Speech Overall Motor Speech: Appears within functional limits for tasks assessed           Keyler Hoge B. Murvin Natal, [facility], CCC-SLP Speech Language Pathologist Office: [phone]  Leigh Aurora 10/31/2021, 3:49 PM

## 2021-10-31 NOTE — ED Triage Notes (Signed)
Pt BIB EMS from [place] nursing [facility]. Pt had an unwitnessed fall at 2100. Pt is at baseline.

## 2021-10-31 NOTE — H&P (Signed)
History and Physical    Patient: Brent Young QIW:[id number] DOB: May 05, 1960 DOA: 10/31/2021 DOS: the patient was seen and examined on 10/31/2021 PCP: Grayce Sessions, NP  Patient coming from: SNF  Chief Complaint:  Chief Complaint  Patient presents with   Fall   HPI: Brent Young is a 61 y.o. 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  EMS apparently was called due to unequal pupils, but the patient has a degree of anisocoria at baseline per report.  No acute complaints at the time.  He has been on treatment for UTI with gentamicin.  He is unable to provide further information.  No further information from the facility or least the patient's relatives.  ED course: Initial vital signs were temperature 97.7 F, pulse 76, respirations 16, BP 108/58 mmHg O2 sat 100% on room air.  The patient received 500 mL of LR bolus and was started on LR at 125 mL/h.  Lab work: CBC showed a white count of 13.1 with 59% neutrophils, hemoglobin 13.0 g/dL and platelets 417.  CMP showed a BUN of 38 and creatinine 3.61 mg/dL.  Renal function was normal 3 days ago.  Troponin this morning was 18 ng/L.  Urine culture from 3 days ago was negative.  Imaging: No active disease on the one-view portable chest radiograph.  Renal ultrasound was unremarkable.  CT head and CT C-spine with no evidence of acute intracranial or cervical spine injury.  There was notable chronic small vessel ischemia.  MR brain without contrast showed several acute and more subacute small infarcts involving different vascular distributions bilaterally.   There was chronic microvascular ischemic changes with multiple chronic small vessel infarcts.  Please see images and full radiology report for further details.   Review of Systems: As mentioned in the history of present illness. All other systems reviewed and are negative.  Past Medical History:  Diagnosis Date   Allergy    Arthritis    Diabetes mellitus without complication (HCC)    Hypertension    Stroke [facility])    Past Surgical History:  Procedure Laterality Date   FRACTURE SURGERY Left    Patient fractured left arm   IR ANGIO INTRA EXTRACRAN SEL COM CAROTID INNOMINATE UNI L MOD SED  02/19/2021   IR ANGIO INTRA EXTRACRAN SEL INTERNAL CAROTID UNI R MOD SED  02/19/2021   IR ANGIO VERTEBRAL SEL SUBCLAVIAN INNOMINATE UNI R MOD SED  02/19/2021   IR ANGIO VERTEBRAL SEL VERTEBRAL UNI L MOD SED  02/19/2021   IR US GUIDE VASC ACCESS RIGHT  02/19/2021   Social History:  reports that he has been smoking cigarettes. He has never used smokeless tobacco. He reports that he does not currently use alcohol. He reports that he does not currently use drugs after having used the following drugs: Marijuana.  No Known Allergies  Family History  Problem Relation Age of Onset   Colon cancer Maternal Grandmother    Esophageal cancer Neg Hx    Stomach cancer Neg Hx    Rectal cancer Neg Hx     Prior to Admission medications   Medication Sig Start Date End Date Taking? Authorizing Provider  acetaminophen (TYLENOL) 650 MG  suppository Place 1 suppository (650 mg total) rectally every 4 (four) hours as needed for mild pain (or temp > 37.5 C (99.5 F)). 03/24/21   Love, Evlyn Kanner, PA-C  albuterol (PROVENTIL) (2.5 MG/3ML) 0.083% nebulizer solution Take 3 mLs (2.5 mg total) by nebulization every 6 (six) hours as needed for wheezing or shortness of breath. 04/01/21   Angiulli, Mcarthur Rossetti, PA-C  amLODipine (NORVASC) 10 MG tablet Take 1 tablet (10 mg total) by mouth daily. 05/28/20   Grayce Sessions, NP  aspirin EC  325 MG EC tablet Take 1 tablet (325 mg total) by mouth daily. 04/02/21   Angiulli, Mcarthur Rossetti, PA-C  atorvastatin (LIPITOR) 40 MG tablet Take 1 tablet (40 mg total) by mouth daily. 04/02/21   Angiulli, Mcarthur Rossetti, PA-C  diclofenac Sodium (VOLTAREN) 1 % GEL Apply 2 g topically 4 (four) times daily. 04/02/21   Angiulli, Mcarthur Rossetti, PA-C  fluticasone (FLONASE) 50 MCG/ACT nasal spray Place 1 spray into both nostrils daily. 10/29/21   Jeannie Fend, PA-C  gabapentin (NEURONTIN) 300 MG capsule TAKE 1 CAPSULE BY MOUTH THREE TIMES A DAY Patient taking differently: Take 300 mg by mouth 3 (three) times daily. 12/16/20   Grayce Sessions, NP  hydrOXYzine (ATARAX) 10 MG tablet Take 1 tablet (10 mg total) by mouth 3 (three) times daily as needed for anxiety. 04/01/21   Angiulli, Mcarthur Rossetti, PA-C  irbesartan (AVAPRO) 75 MG tablet Take 1 tablet (75 mg total) by mouth daily. 04/02/21   Angiulli, Mcarthur Rossetti, PA-C  linagliptin (TRADJENTA) 5 MG TABS tablet Take 1 tablet (5 mg total) by mouth daily. 04/02/21   Angiulli, Mcarthur Rossetti, PA-C  metFORMIN (GLUCOPHAGE) 1000 MG tablet Take 1 tablet (1,000 mg total) by mouth 2 (two) times daily with a meal. 05/28/20   Grayce Sessions, NP  metoprolol succinate (TOPROL-XL) 25 MG 24 hr tablet Take 1 tablet (25 mg total) by mouth daily. 04/02/21   Angiulli, Mcarthur Rossetti, PA-C  mometasone-formoterol (DULERA) 100-5 MCG/ACT AERO Inhale 2 puffs into the lungs 2 (two) times daily. 04/01/21   Angiulli, Mcarthur Rossetti, PA-C  Oxcarbazepine (TRILEPTAL) 300 MG tablet Take 1 tablet (300 mg total) by mouth 3 (three) times daily. 02/04/20   Grayce Sessions, NP  pantoprazole (PROTONIX) 40 MG tablet Take 1 tablet (40 mg total) by mouth daily. 04/02/21   Charlton Amor, PA-C    Physical Exam: Vitals:   10/31/21 0930 10/31/21 0945 10/31/21 1030 10/31/21 1059  BP: 110/69 (!) 138/124 130/68   Pulse: 79 91 82   Resp: 14  15   Temp:      TempSrc:      SpO2: 99% 99% 100%   Weight:    114.2 kg  Height:    6\' 1"   (1.854 m)   Physical Exam Vitals and nursing note reviewed.  Constitutional:      General: He is not in acute distress.    Appearance: He is obese.  HENT:     Head: Normocephalic.     Mouth/Throat:     Mouth: Mucous membranes are moist.  Eyes:     General: No scleral icterus. Neck:     Vascular: No JVD.  Cardiovascular:     Rate and Rhythm: Normal rate and regular rhythm.     Heart sounds: S1 normal and S2 normal. Murmur heard.     Systolic murmur is present with a grade of 2/6.  Pulmonary:     Effort: Pulmonary effort is normal.  Breath sounds: No wheezing, rhonchi or rales.  Abdominal:     General: Bowel sounds are normal. There is no distension.     Palpations: Abdomen is soft.     Tenderness: There is no abdominal tenderness. There is no right CVA tenderness, left CVA tenderness or guarding.  Musculoskeletal:     Cervical back: Neck supple.     Right lower leg: No edema.     Left lower leg: No edema.  Skin:    General: Skin is warm and dry.  Neurological:     Mental Status: He is alert. Mental status is at baseline.     Motor: Weakness present. No seizure activity.     Comments: Unable to fully evaluate due to the patient's aphasia.  Psychiatric:        Mood and Affect: Mood normal.   Data Reviewed:  There are no new results to review at this time.  EKG: Vent. rate 82 BPM PR interval 199 ms QRS duration 113 ms QT/QTcB 423/495 ms P-R-T axes 78 -53 68 Age not entered, assumed to be 60 yo for purpose of ECG interpretation Sinus rhythm Atrial premature complex Incomplete right bundle branch block Inferior infarct, old borderline ST elevation  Assessment and Plan: Principal Problem:   Acute CVA (cerebrovascular accident) (HCC) Inpatient/telemetry. Frequent neurochecks. Consult PT and OT. Check fasting lipids. Check hemoglobin A1c. Check carotid Doppler. Check echocardiogram. Risk factors modifications. Stroke team has been consulted  Active  Problems:   AKI (acute kidney injury) (HCC) Continue IV fluids. Hold ARB/ACE. Avoid hypotension. Avoid nephrotoxins. Monitor intake and output. Monitor renal function electrolytes. Awaiting for urinalysis.    History of UTI No results on labs. No results in Care Everywhere. We are waiting for urinalysis. Check gentamicin level.    Essential hypertension Hold antihypertensives for now. Allow permissive hypertension.    Diabetes mellitus type 2 in obese (HCC) Carbohydrate modified diet. Hold metformin due to AKI. Continue Tradjenta 5 mg p.o. daily. CBG monitoring with RI SS. Check hemoglobin A1c.    Elevated troponin Likely demand ischemia.    ASCVD (arteriosclerotic cardiovascular disease) Currently NPO. Continue aspirin, atorvastatin and metoprolol.    Hyperlipidemia, unspecified Resume atorvastatin once cleared for oral intake.    Leukocytosis Monitor WBC.    Impaired functional mobility, balance, gait, and endurance PT and OT have been consulted.    Class 1 obesity Lifestyle modifications.     Advance Care Planning:   Code Status: Full Code   Consults: Neuro hospitalist team.  Family Communication: Unable to contact.  Severity of Illness: The appropriate patient status for this patient is INPATIENT. Inpatient status is judged to be reasonable and necessary in order to provide the required intensity of service to ensure the patient's safety. The patient's presenting symptoms, physical exam findings, and initial radiographic and laboratory data in the context of their chronic comorbidities is felt to place them at high risk for further clinical deterioration. Furthermore, it is not anticipated that the patient will be medically stable for discharge from the hospital within 2 midnights of admission.   * I certify that at the point of admission it is my clinical judgment that the patient will require inpatient hospital care spanning beyond 2 midnights from the  point of admission due to high intensity of service, high risk for further deterioration and high frequency of surveillance required.*  Author: Bobette Mo, MD 10/31/2021 11:08 AM  For on call review [email or web address].   This document was prepared using Dragon  voice recognition software and may contain some unintended transcription errors.

## 2021-10-31 NOTE — ED Provider Notes (Signed)
Unwitnessed fall from [place].  MRI to rule out acute CVA.  Patient has no acute renal failure, plan for admission.  Patient does not have any specific complaints.  He denies any focus of significant pain.  He does not seem to recall very clearly which was his good side.  He seems to think that the left was his good side and is moving his left arm.  History suggests that his right side was more functional side.  Right now he has a flaccid paralysis of the right arm.  Physical Exam  BP (!) 108/58 (BP Location: Left Arm)   Pulse 78   Temp 97.7 F (36.5 C) (Oral)   Resp 10   SpO2 99%   Physical Exam Patient is alert calm and in no acute distress. Heart is regular.  Respirations are nonlabored.  Abdomen is soft and nontender.  No suprapubic discomfort to deep palpation.  No bladder distention.  Bilateral lower extremities are symmetric without significant edema.  Procedures  Procedures CRITICAL CARE Performed by: Arby Barrette   Total critical care time: 30 minutes  Critical care time was exclusive of separately billable procedures and treating other patients.  Critical care was necessary to treat or prevent imminent or life-threatening deterioration.  Critical care was time spent personally by me on the following activities: development of treatment plan with patient and/or surrogate as well as nursing, discussions with consultants, evaluation of patient's response to treatment, examination of patient, obtaining history from patient or surrogate, ordering and performing treatments and interventions, ordering and review of laboratory studies, ordering and review of radiographic studies, pulse oximetry and re-evaluation of patient's condition.  ED Course / MDM    Medical Decision Making Amount and/or Complexity of Data Reviewed Labs: ordered. Radiology: ordered.  Risk Prescription drug management.   Patient is getting gentamicin for UTI on outpatient basis for past week.  I  have ordered lactated Ringer's 500 cc bolus and 125 continuous for acute kidney injury.  May be secondary to gentamicin or possibly dehydration.  MRI is positive for acute and subacute stroke.  Consult: Reviewed with Dr. Otelia Limes.  He has personally reviewed the MRI and advises consistent with acute strokes recommends transfer to [facility].         Arby Barrette, MD 10/31/21 [phone]

## 2021-10-31 NOTE — ED Notes (Signed)
[facility] called to transport patient to [place]. 

## 2021-10-31 NOTE — ED Notes (Signed)
Pt transported to CT ?

## 2021-11-01 ENCOUNTER — Inpatient Hospital Stay (HOSPITAL_COMMUNITY): Payer: Medicaid Other

## 2021-11-01 DIAGNOSIS — I639 Cerebral infarction, unspecified: Secondary | ICD-10-CM

## 2021-11-01 DIAGNOSIS — N179 Acute kidney failure, unspecified: Secondary | ICD-10-CM

## 2021-11-01 LAB — CBC
HCT: 34.1 % — ABNORMAL LOW (ref 39.0–52.0)
Hemoglobin: 11.2 g/dL — ABNORMAL LOW (ref 13.0–17.0)
MCH: 29.5 pg (ref 26.0–34.0)
MCHC: 32.8 g/dL (ref 30.0–36.0)
MCV: 89.7 fL (ref 80.0–100.0)
Platelets: 279 10*3/uL (ref 150–400)
RBC: 3.8 MIL/uL — ABNORMAL LOW (ref 4.22–5.81)
RDW: 14.3 % (ref 11.5–15.5)
WBC: 8.6 10*3/uL (ref 4.0–10.5)
nRBC: 0 % (ref 0.0–0.2)

## 2021-11-01 LAB — LIPID PANEL
Cholesterol: 93 mg/dL (ref 0–200)
HDL: 28 mg/dL — ABNORMAL LOW (ref >40)
LDL Cholesterol: 45 mg/dL (ref 0–99)
Total CHOL/HDL Ratio: 3.3 RATIO
Triglycerides: 98 mg/dL (ref <150)
VLDL: 20 mg/dL (ref 0–40)

## 2021-11-01 LAB — VITAMIN B12: Vitamin B-12: 294 pg/mL (ref 180–914)

## 2021-11-01 LAB — BASIC METABOLIC PANEL
Anion gap: 8 (ref 5–15)
BUN: 30 mg/dL — ABNORMAL HIGH (ref 8–23)
CO2: 30 mmol/L (ref 22–32)
Calcium: 8.6 mg/dL — ABNORMAL LOW (ref 8.9–10.3)
Chloride: 104 mmol/L (ref 98–111)
Creatinine, Ser: 2.11 mg/dL — ABNORMAL HIGH (ref 0.61–1.24)
GFR, Estimated: 35 mL/min — ABNORMAL LOW (ref >60)
Glucose, Bld: 81 mg/dL (ref 70–99)
Potassium: 3.5 mmol/L (ref 3.5–5.1)
Sodium: 142 mmol/L (ref 135–145)

## 2021-11-01 LAB — URINALYSIS, ROUTINE W REFLEX MICROSCOPIC
Bilirubin Urine: NEGATIVE
Glucose, UA: NEGATIVE mg/dL
Hgb urine dipstick: NEGATIVE
Ketones, ur: NEGATIVE mg/dL
Leukocytes,Ua: NEGATIVE
Nitrite: NEGATIVE
Protein, ur: NEGATIVE mg/dL
Specific Gravity, Urine: 1.013 (ref 1.005–1.030)
pH: 5 (ref 5.0–8.0)

## 2021-11-01 LAB — SEDIMENTATION RATE: Sed Rate: 38 mm/hr — ABNORMAL HIGH (ref 0–16)

## 2021-11-01 LAB — GLUCOSE, CAPILLARY
Glucose-Capillary: 76 mg/dL (ref 70–99)
Glucose-Capillary: 82 mg/dL (ref 70–99)
Glucose-Capillary: 103 mg/dL — ABNORMAL HIGH (ref 70–99)

## 2021-11-01 LAB — RPR: RPR Ser Ql: NONREACTIVE

## 2021-11-01 LAB — AMMONIA: Ammonia: 30 umol/L (ref 9–35)

## 2021-11-01 LAB — FOLATE: Folate: 6.8 ng/mL (ref >5.9)

## 2021-11-01 LAB — MRSA NEXT GEN BY PCR, NASAL: MRSA by PCR Next Gen: NOT DETECTED

## 2021-11-01 LAB — TSH: TSH: 0.195 u[IU]/mL — ABNORMAL LOW (ref 0.350–4.500)

## 2021-11-01 MED ORDER — HYDROXYZINE HCL 10 MG PO TABS
10.0000 mg | ORAL_TABLET | Freq: Three times a day (TID) | ORAL | Status: DC | PRN
Start: 2021-11-01 — End: 2021-11-07
  Administered 2021-11-01 – 2021-11-06 (×4): 10 mg via ORAL
  Filled 2021-11-01 (×5): qty 1

## 2021-11-01 MED ORDER — CLOPIDOGREL BISULFATE 75 MG PO TABS
75.0000 mg | ORAL_TABLET | Freq: Every day | ORAL | Status: DC
  Administered 2021-11-01 – 2021-11-06 (×6): 75 mg via ORAL
  Filled 2021-11-01 (×6): qty 1

## 2021-11-01 MED ORDER — ASPIRIN 325 MG PO TBEC: 325.0000 mg | DELAYED_RELEASE_TABLET | Freq: Every day | ORAL | Status: DC

## 2021-11-01 MED ORDER — TRAZODONE HCL 50 MG PO TABS
50.0000 mg | ORAL_TABLET | Freq: Every day | ORAL | Status: DC
  Administered 2021-11-01 – 2021-11-06 (×6): 50 mg via ORAL
  Filled 2021-11-01 (×6): qty 1

## 2021-11-01 MED ORDER — ALBUTEROL SULFATE (2.5 MG/3ML) 0.083% IN NEBU
2.5000 mg | INHALATION_SOLUTION | Freq: Four times a day (QID) | RESPIRATORY_TRACT | Status: DC | PRN
Start: 2021-11-01 — End: 2021-11-07

## 2021-11-01 MED ORDER — MOMETASONE FURO-FORMOTEROL FUM 100-5 MCG/ACT IN AERO
2.0000 | INHALATION_SPRAY | Freq: Two times a day (BID) | RESPIRATORY_TRACT | Status: DC
Start: 2021-11-01 — End: 2021-11-07
  Administered 2021-11-01 – 2021-11-06 (×11): 2 via RESPIRATORY_TRACT
  Filled 2021-11-01: qty 8.8

## 2021-11-01 MED ORDER — ASPIRIN 81 MG PO TBEC
81.0000 mg | DELAYED_RELEASE_TABLET | Freq: Every day | ORAL | Status: DC
  Administered 2021-11-01 – 2021-11-06 (×6): 81 mg via ORAL
  Filled 2021-11-01 (×6): qty 1

## 2021-11-01 MED ORDER — ATORVASTATIN CALCIUM 40 MG PO TABS
40.0000 mg | ORAL_TABLET | Freq: Every day | ORAL | Status: DC
  Administered 2021-11-01 – 2021-11-06 (×6): 40 mg via ORAL
  Filled 2021-11-01 (×6): qty 1

## 2021-11-01 MED ORDER — PANTOPRAZOLE SODIUM 40 MG PO TBEC
40.0000 mg | DELAYED_RELEASE_TABLET | Freq: Every day | ORAL | Status: DC
  Administered 2021-11-01 – 2021-11-06 (×6): 40 mg via ORAL
  Filled 2021-11-01 (×6): qty 1

## 2021-11-01 MED ORDER — OXCARBAZEPINE 300 MG PO TABS
300.0000 mg | ORAL_TABLET | Freq: Three times a day (TID) | ORAL | Status: DC
  Administered 2021-11-01 – 2021-11-06 (×17): 300 mg via ORAL
  Filled 2021-11-01 (×17): qty 1

## 2021-11-01 MED ORDER — INSULIN ASPART 100 UNIT/ML IJ SOLN: 0.0000 [IU] | Freq: Every day | INTRAMUSCULAR | Status: DC

## 2021-11-01 MED ORDER — METOPROLOL SUCCINATE ER 50 MG PO TB24
50.0000 mg | ORAL_TABLET | Freq: Every day | ORAL | Status: DC
  Administered 2021-11-01 – 2021-11-06 (×6): 50 mg via ORAL
  Filled 2021-11-01 (×6): qty 1

## 2021-11-01 MED ORDER — GABAPENTIN 300 MG PO CAPS
300.0000 mg | ORAL_CAPSULE | Freq: Three times a day (TID) | ORAL | Status: DC
  Administered 2021-11-01 – 2021-11-06 (×16): 300 mg via ORAL
  Filled 2021-11-01 (×16): qty 1

## 2021-11-01 MED ORDER — AMLODIPINE BESYLATE 10 MG PO TABS
10.0000 mg | ORAL_TABLET | Freq: Every day | ORAL | Status: DC
  Administered 2021-11-01 – 2021-11-06 (×6): 10 mg via ORAL
  Filled 2021-11-01 (×6): qty 1

## 2021-11-01 MED ORDER — HYDRALAZINE HCL 10 MG PO TABS
10.0000 mg | ORAL_TABLET | ORAL | Status: DC | PRN
Start: 2021-11-01 — End: 2021-11-02
  Administered 2021-11-01 – 2021-11-02 (×2): 10 mg via ORAL
  Filled 2021-11-01 (×2): qty 1

## 2021-11-01 MED ORDER — INSULIN ASPART 100 UNIT/ML IJ SOLN
0.0000 [IU] | Freq: Three times a day (TID) | INTRAMUSCULAR | Status: DC
  Administered 2021-11-02 – 2021-11-05 (×7): 1 [IU] via SUBCUTANEOUS
  Administered 2021-11-06: 2 [IU] via SUBCUTANEOUS
  Administered 2021-11-06: 1 [IU] via SUBCUTANEOUS

## 2021-11-01 MED ORDER — ENOXAPARIN SODIUM 40 MG/0.4ML IJ SOSY
40.0000 mg | PREFILLED_SYRINGE | Freq: Every day | INTRAMUSCULAR | Status: DC
  Administered 2021-11-02 – 2021-11-06 (×5): 40 mg via SUBCUTANEOUS
  Filled 2021-11-01 (×5): qty 0.4

## 2021-11-01 NOTE — Progress Notes (Signed)
Physical Therapy Treatment Patient Details Name: Brent Young MRN: [id number] DOB: [birth year] Today's Date: 11/01/2021   History of Present Illness Pt is a 61 y/o M admitted 8/11 after unwitnessed fall. MRI revealing several acute/subacute infarcts involving different vascular distributions bilaterally. 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    PT Comments    RN and tech asked for help to transfer pt back to bed. Floor without appropriate lift pads for maximove. Pt totalAX2 for transfer back to bed from drop arm recliner towards the Left.    Recommendations for follow up therapy are one component of a multi-disciplinary discharge planning process, led by the attending physician.  Recommendations may be updated based on patient status, additional functional criteria and insurance authorization.  Follow Up Recommendations  Skilled nursing-short term rehab (<3 hours/day) Can patient physically be transported by private vehicle: No   Assistance Recommended at Discharge Frequent or constant Supervision/Assistance  Patient can return home with the following Two people to help with walking and/or transfers;A lot of help with bathing/dressing/bathroom;Assistance with cooking/housework;Assistance with feeding;Direct supervision/assist for medications management;Direct supervision/assist for financial management;Assist for transportation;Help with stairs or ramp for entrance   Equipment Recommendations  Other (comment) (defer to next venue)    Recommendations for Other Services       Precautions / Restrictions Precautions Precautions: Fall Restrictions Weight Bearing Restrictions: No     Mobility  Bed Mobility Overal bed mobility: Needs Assistance Bed Mobility: Rolling, Sit to Sidelying Rolling: Max assist, +2 for physical assistance Sidelying to sit: Max assist, +2 for physical assistance     Sit to  sidelying: Total assist, +2 for physical assistance General bed mobility comments: pt maxA for rolling, unable to use bilat UEs functionally to assist, only R LE, pt with no eccentric control to lower trunk back to supine    Transfers Overall transfer level: Needs assistance Equipment used: 2 person hand held assist Transfers: Sit to/from Stand, Bed to chair/wheelchair/BSC Sit to Stand: Max assist, +2 physical assistance, Total assist     Squat pivot transfers: Total assist, +2 physical assistance    Lateral/Scoot Transfers: Total assist, +2 physical assistance General transfer comment: pt had to transfer back towards the L side due to chair only having a drop arm on the L side. Pt again unable to functionally assist with bilat UEs and was dependent on RN and PT to return to bed    Ambulation/Gait               General Gait Details: pt non-ambulatory   Stairs             Wheelchair Mobility    Modified Rankin (Stroke Patients Only) Modified Rankin (Stroke Patients Only) Pre-Morbid Rankin Score: Severe disability Modified Rankin: Severe disability     Balance Overall balance assessment: Needs assistance Sitting-balance support: Feet supported, Bilateral upper extremity supported Sitting balance-Leahy Scale: Poor Sitting balance - Comments: posterior and R lateral lean   Standing balance support: During functional activity Standing balance-Leahy Scale: Zero Standing balance comment: reliant on 2 person max A                            Cognition Arousal/Alertness: Awake/alert Behavior During Therapy: WFL for tasks assessed/performed Overall Cognitive Status: Impaired/Different from baseline Area of Impairment: Attention, Memory, Following commands, Safety/judgement, Awareness, Problem solving, Orientation  Orientation Level: Time, Place, Situation ("another rehab center") Current Attention Level: Focused Memory: Decreased  short-term memory Following Commands: Follows one step commands with increased time, Follows one step commands inconsistently Safety/Judgement: Decreased awareness of safety, Decreased awareness of deficits Awareness: Intellectual Problem Solving: Slow processing, Requires verbal cues, Decreased initiation, Difficulty sequencing, Requires tactile cues General Comments: pt thinking he is at a different rehab facility, pt with decreased insight to deficits stating "I know I can walk but they won't help me", pt with unrealistic expectations/goals, pt is pleasant and cooperative but very impaired command following        Exercises      General Comments General comments (skin integrity, edema, etc.): SpO2 at 88% on RA, SPO2 at 93% on 2Lo2 via       Pertinent Vitals/Pain Pain Assessment Pain Assessment: No/denies pain    Home Living Family/patient expects to be discharged to:: Skilled nursing facility (LTC at [facility] since 1/23)   Available Help at Discharge: Skilled Nursing Facility Type of Home: Skilled Nursing Facility             Additional Comments: transferred via hoyer lift, uses w/c primarily, able to self feed but needs assist for all other ADLs    Prior Function            PT Goals (current goals can now be found in the care plan section) Acute Rehab PT Goals Patient Stated Goal: to go back to "[facility] so I can learn to walk" PT Goal Formulation: With patient Time For Goal Achievement: 11/15/21 Potential to Achieve Goals: Fair Progress towards PT goals: Progressing toward goals    Frequency    Min 2X/week      PT Plan Current plan remains appropriate    Co-evaluation PT/OT/SLP Co-Evaluation/Treatment: Yes Reason for Co-Treatment: Complexity of the patient's impairments (multi-system involvement) PT goals addressed during session: Mobility/safety with mobility        AM-PAC PT "6 Clicks" Mobility   Outcome Measure  Help needed turning from  your back to your side while in a flat bed without using bedrails?: Total Help needed moving from lying on your back to sitting on the side of a flat bed without using bedrails?: Total Help needed moving to and from a bed to a chair (including a wheelchair)?: Total Help needed standing up from a chair using your arms (e.g., wheelchair or bedside chair)?: Total Help needed to walk in hospital room?: Total Help needed climbing 3-5 steps with a railing? : Total 6 Click Score: 6    End of Session Equipment Utilized During Treatment: Gait belt Activity Tolerance: Patient tolerated treatment well Patient left: in bed;with call bell/phone within reach;with bed alarm set;with nursing/sitter in room Nurse Communication: Mobility status;Need for lift equipment PT Visit Diagnosis: Muscle weakness (generalized) (M62.81)     Time: 1287-8676 PT Time Calculation (min) (ACUTE ONLY): 19 min  Charges:  $Therapeutic Activity: 8-22 mins                     Lewis Shock, PT, DPT Acute Rehabilitation Services Secure chat preferred Office #: [phone]    Iona Hansen 11/01/2021, 11:53 AM

## 2021-11-01 NOTE — Evaluation (Addendum)
Occupational Therapy Evaluation Patient Details Name: Brent Young MRN: [id number] DOB: 1960/11/27 Today's Date: 11/01/2021   History of Present Illness Pt is a 61 y/o M admitted 8/11 after unwitnessed fall. MRI revealing several acute/subacute infarcts involving different vascular distributions bilaterally. 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   Clinical Impression   Pt from [facility], at baseline needing assist for ADLs, but is able to self feed. Pt oriented to self only, this session, reports using w/c at baseline and hoyer lift to transfer. Pt currently presenting with decreased strength and ROM of BUE, R > L, needing hand over hand assist for functional grooming task sitting EOB. Pt needing max-total A +2 for bed mobility and chair transfers. Pt presenting with impairments listed below, will follow acutely. Recommend SNF at d/c.     Recommendations for follow up therapy are one component of a multi-disciplinary discharge planning process, led by the attending physician.  Recommendations may be updated based on patient status, additional functional criteria and insurance authorization.   Follow Up Recommendations  Skilled nursing-short term rehab (<3 hours/day)    Assistance Recommended at Discharge Frequent or constant Supervision/Assistance  Patient Young return home with the following Two people to help with walking and/or transfers;Two people to help with bathing/dressing/bathroom;Assistance with cooking/housework;Assistance with feeding;Direct supervision/assist for medications management;Direct supervision/assist for financial management;Assist for transportation;Help with stairs or ramp for entrance    Functional Status Assessment  Patient has had a recent decline in their functional status and demonstrates the ability to make significant improvements in function in a reasonable and predictable  amount of time.  Equipment Recommendations  None recommended by OT;Other (comment) (defer)    Recommendations for Other Services PT consult     Precautions / Restrictions Precautions Precautions: Fall Restrictions Weight Bearing Restrictions: No      Mobility Bed Mobility Overal bed mobility: Needs Assistance Bed Mobility: Sidelying to Sit   Sidelying to sit: Max assist, +2 for physical assistance       General bed mobility comments: max A +2 for trunk elevation and bringing BLE's off of bed    Transfers Overall transfer level: Needs assistance Equipment used: 2 person hand held assist Transfers: Sit to/from Stand, Bed to chair/wheelchair/BSC Sit to Stand: Max assist, +2 physical assistance, Total assist Stand pivot transfers: Max assist, +2 physical assistance, Total assist         General transfer comment: pt unable to use BUE's to push      Balance Overall balance assessment: Needs assistance Sitting-balance support: Feet supported, Bilateral upper extremity supported Sitting balance-Leahy Scale: Poor Sitting balance - Comments: posterior and R lateral lean   Standing balance support: During functional activity Standing balance-Leahy Scale: Zero Standing balance comment: reliant on 2 person max A                           ADL either performed or assessed with clinical judgement   ADL Overall ADL's : Needs assistance/impaired Eating/Feeding: Moderate assistance   Grooming: Maximal assistance   Upper Body Bathing: Maximal assistance   Lower Body Bathing: Total assistance   Upper Body Dressing : Maximal assistance   Lower Body Dressing: Total assistance   Toilet Transfer: Total assistance;Maximal assistance;+2 for physical assistance   Toileting- Clothing Manipulation and Hygiene: Total assistance       Functional mobility during ADLs: Maximal assistance;Total assistance;+2 for physical  assistance       Vision Baseline  Vision/History: 1 Wears glasses Additional Comments: no change per pt report, disconjugate gaze noted, will further assess     Perception     Praxis      Pertinent Vitals/Pain Pain Assessment Pain Assessment: No/denies pain     Hand Dominance Right   Extremity/Trunk Assessment Upper Extremity Assessment Upper Extremity Assessment: RUE deficits/detail;LUE deficits/detail RUE Deficits / Details: able to elevate shoulders, 0/5 strength, no active movement noted RUE Sensation: WNL RUE Coordination: decreased fine motor;decreased gross motor LUE Deficits / Details: 2/5 AROM, decreased strength and coordination, needing hand over hand assist to guide washcloth to face for grooming task LUE Sensation: WNL LUE Coordination: decreased fine motor;decreased gross motor   Lower Extremity Assessment Lower Extremity Assessment: Defer to PT evaluation   Cervical / Trunk Assessment Cervical / Trunk Assessment: Normal   Communication Communication Communication: No difficulties   Cognition Arousal/Alertness: Awake/alert Behavior During Therapy: WFL for tasks assessed/performed Overall Cognitive Status: Impaired/Different from baseline Area of Impairment: Attention, Memory, Following commands, Safety/judgement, Awareness, Problem solving, Orientation                 Orientation Level: Time, Place, Situation Current Attention Level: Focused Memory: Decreased short-term memory Following Commands: Follows one step commands with increased time, Follows one step commands inconsistently Safety/Judgement: Decreased awareness of safety, Decreased awareness of deficits Awareness: Intellectual Problem Solving: Slow processing, Requires verbal cues, Decreased initiation, Difficulty sequencing General Comments: pt thinking he is at a different rehab facility, needing increased cuing to perform  siimple ADL task seated EOB     General Comments  VSS on supplemental O2    Exercises      Shoulder Instructions      Home Living Family/patient expects to be discharged to:: Skilled nursing facility   Available Help at Discharge: Skilled Nursing Facility Type of Home: Skilled Nursing Facility                           Additional Comments: transferred via hoyer lift, uses w/c primarily, able to self feed but needs assist for all other ADLs      Prior Functioning/Environment Prior Level of Function : Needs assist       Physical Assist : ADLs (physical);Mobility (physical) Mobility (physical): Transfers ADLs (physical): Grooming;Bathing;Toileting;Dressing;IADLs Mobility Comments: using hoyer lift for transfers, and w/c at baseline ADLs Comments: states he receives assist for ADLs, able to self feed        OT Problem List: Decreased range of motion;Decreased strength;Decreased activity tolerance;Impaired balance (sitting and/or standing);Impaired vision/perception;Decreased cognition;Decreased coordination;Decreased safety awareness;Decreased knowledge of precautions;Decreased knowledge of use of DME or AE;Impaired UE functional use      OT Treatment/Interventions: Self-care/ADL training;Therapeutic exercise;DME and/or AE instruction;Energy conservation;Therapeutic activities;Visual/perceptual remediation/compensation;Cognitive remediation/compensation;Patient/family education;Balance training;Neuromuscular education    OT Goals(Current goals Young be found in the care plan section) Acute Rehab OT Goals Patient Stated Goal: to go to rehab OT Goal Formulation: With patient Time For Goal Achievement: 11/15/21 Potential to Achieve Goals: Good  OT Frequency: Min 2X/week    Co-evaluation PT/OT/SLP Co-Evaluation/Treatment: Yes Reason for Co-Treatment: Complexity of the patient's impairments (multi-system involvement);To address functional/ADL transfers;For patient/therapist safety   OT goals addressed during session: ADL's and self-care;Strengthening/ROM       AM-PAC OT "6 Clicks" Daily Activity     Outcome Measure Help from another person eating meals?: A Lot Help from another person taking care of personal grooming?: A  Lot Help from another person toileting, which includes using toliet, bedpan, or urinal?: Total Help from another person bathing (including washing, rinsing, drying)?: A Lot Help from another person to put on and taking off regular upper body clothing?: A Lot Help from another person to put on and taking off regular lower body clothing?: Total 6 Click Score: 10   End of Session Equipment Utilized During Treatment: Oxygen;Gait belt Nurse Communication: Mobility status;Need for lift equipment  Activity Tolerance: Patient tolerated treatment well Patient left: in chair;with call bell/phone within reach  OT Visit Diagnosis: Other abnormalities of gait and mobility (R26.89);Unsteadiness on feet (R26.81);Muscle weakness (generalized) (M62.81);History of falling (Z91.81);Other symptoms and signs involving cognitive function;Low vision, both eyes (H54.2);Hemiplegia and 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

## 2021-11-01 NOTE — Progress Notes (Signed)
VASCULAR LAB    Limited TCD has been performed.  See CV proc for preliminary results.   Ronne Savoia, RVT 11/01/2021, 5:51 PM

## 2021-11-01 NOTE — Progress Notes (Signed)
EEG complete - results pending 

## 2021-11-01 NOTE — Progress Notes (Addendum)
STROKE TEAM PROGRESS NOTE   INTERVAL HISTORY Presented after unwitnessed fall at nursing facility, [place]. Sent to ED via EMS. Recently treated for UTI. Hypertensive since admission. Afebrile.  Today son is at bedside. Son recalls that patient has been at [facility] since Nov 2022 CVA. He was able to do assisted walking but mostly in [facility] recently. Son states he feeds self and is alert, oriented at baseline. We discussed history, current symptoms, ongoing work up, diagnosis of lacunar strokes and work up for etiology. Possibility that these small strokes may be incidental also explained. All questions were answered.   MRI scan of the brain shows small left subcortical and right cerebellar infarcts.  CT angiogram shows 65 to 70% left ICA and severe right vertebral artery origin stenosis.  Echocardiogram shows ejection fraction 65 to 70%.  Carotid and transplant Dopplers are pending. Vitals:   10/31/21 2332 11/01/21 0425 11/01/21 0538 11/01/21 0835  BP: (!) 148/80 (!) 160/84  (!) 170/75  Pulse: 100 89  (!) 102  Resp: 18 14  17   Temp: 98.2 F (36.8 C) 98.4 F (36.9 C)  98.4 F (36.9 C)  TempSrc: Oral Oral  Oral  SpO2: 97% 97%  100%  Weight:   90.7 kg   Height:       CBC:  Recent Labs  Lab 10/28/21 2323 10/31/21 0623 11/01/21 0211  WBC 11.8* 13.1* 8.6  NEUTROABS 6.5 7.9*  --   HGB 12.9* 13.0 11.2*  HCT 38.4* 39.9 34.1*  MCV 89.3 92.1 89.7  PLT 323 297 279   Basic Metabolic Panel:  Recent Labs  Lab 10/31/21 0623 11/01/21 0211  NA 141 142  K 4.3 3.5  CL 100 104  CO2 27 30  GLUCOSE 97 81  BUN 38* 30*  CREATININE 3.61* 2.11*  CALCIUM 8.9 8.6*   Lipid Panel:  Recent Labs  Lab 11/01/21 0211  CHOL 93  TRIG 98  HDL 28*  CHOLHDL 3.3  VLDL 20  LDLCALC 45   HgbA1c:  Recent Labs  Lab 10/31/21 0825  HGBA1C 5.3   Urine Drug Screen: No results for input(s): "LABOPIA", "COCAINSCRNUR", "LABBENZ", "AMPHETMU", "THCU", "LABBARB" in the last 168 hours.  Alcohol Level No  results for input(s): "ETH" in the last 168 hours.  IMAGING past 24 hours No results found.  PHYSICAL EXAM  Temp:  [97.7 F (36.5 C)-98.5 F (36.9 C)] 98.4 F (36.9 C) (08/12 0835) Pulse Rate:  [88-102] 102 (08/12 0835) Resp:  [14-19] 17 (08/12 0835) BP: (144-172)/(68-84) 170/75 (08/12 0835) SpO2:  [97 %-100 %] 100 % (08/12 0835) Weight:  [90.1 kg-90.7 kg] 90.7 kg (08/12 0538)  General -middle-age African-American male, in no apparent distress.  Cardiovascular - Regular rhythm and rate.  Mental Status -  Level of arousal and disoriented to time, place, and person.   Language including expression, naming, repetition, comprehension was assessed and found intact. Attention span and concentration were impaired. Recent and remote memory were impaired Poor insight into his condition. Cranial Nerves II - XII - II - Visual field impaired left, full on the right. III, IV, VI - Extraocular movements intact.  Jaw jerk is brisk. V - Facial sensation intact bilaterally. VII - Facial movement intact bilaterally. VIII - Hearing & vestibular intact bilaterally. X - Palate elevates symmetrically. XI - Chin turning & shoulder shrug intact bilaterally. XII - Tongue protrusion intact.  Motor Strength - Right hemibody with RUE 2/5, RLE 4/5 LUE 3/5, LLE 0/5 Motor Tone - Muscle tone was assessed  at the neck and appendages and was normal.  Sensory - Light touch, temperature/pinprick were assessed and were symmetrical.    Coordination -   Gait and Station - deferred.   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  Patient is poor historian.  Recently treated for UTI with gentamicin.  In the emergency room hemodynamically stable, found to have acute kidney injury.  Also found to have several acute or more subacute to small infarcts involving multiple vascular territories likely lacunar strokes due to small vessel disease.  Seen by neurology and admitted as a stroke.  Skeletal survey negative including CT head and CT spine.  Stroke: Acute multifocal ischemic strokes likely due to small vessel disease in a patient with multiple prior subcortical infarcts and history of hypertensive intracerebral hemorrhage  Code Stroke CT head  Unable to obtain CTA d/t AKI  Bilat carotid US PENDING Transcranial doppler PENDING MRI Brain wo contrast  Several acute and more subacute small infarcts involving different vascular distributions bilaterally. CT cervical spine wo contrast No evidence of acute intracranial or cervical spine injury MR cervical spine PENDING 2D Echo 65-70% EF, Left atrium severely dilated, No thrombus or shunt detected Transcranial doppler is PENDING Bilat carotid US  Right Carotid: Velocities in the right ICA are consistent with a 1-39%  stenosis.  Left Carotid: Velocities in the left ICA are consistent with a 1-39%  stenosis. Vertebrals: Bilateral vertebral arteries demonstrate antegrade flow.  LDL 45 HgbA1c 5.3 VTE prophylaxis - recommended    Diet   Diet Carb Modified Fluid consistency: Thin; Room service appropriate? Yes   On ASA 325 prior to admission Recommend ASA 81mg  and Plavix x 3 weeks then Plavix as monotherapy for life Therapy recommendations:  Pending, came from facility Disposition:  TBD  Hypertension Stable Permissive hypertension (OK if < 220/120) but gradually normalize in 5-7 days Long-term BP goal normotensive  Hyperlipidemia Home meds:  Lipitor 40mg  resumed in hospital LDL 45, goal < 70 High intensity statin not indicated as patient is at  goal Continue statin at discharge  History of Stroke Nov 2022 acute to subacute right BG and CR infarcts secondary to small versus large vessel atherosclerosis in the setting of multiple uncontrolled risk factors CT Head - Negative CTA H&N - The right V4 is likely occluded proximal to the takeoff of the right PICA, with no definitive flow seen in the right PICA, which is new from prior exam. Re-demonstrated moderate to severe calcified plaque in the carotid siphons, right greater than left, overall unchanged. 65-70% stenosis of the proximal left ICA MRI head - Acute to subacute perforator infarcts at the right basal ganglia and corona radiata. Extensive chronic small vessel ischemia. Cerebral angiogram left ICA 65 to 70% stenosis, bilateral ICA siphon mild to moderate stenosis, right VA severe stenosis 2D Echo - EF 60 to 65% LDL - 98 HgbA1c -9.9 UDS - positive for THC Discharged on ASA 325 and Plavix 75mg  DAPT for 3 months and then aspirin alone due to intracranial stenosis  AMS  DDx includes but is not limited to worsening encephalopathy due to acute kidney injury versus toxic/metabolic etiologies, infection, seizure, and  undiagnosed dementia.  Home medications include trazodone and oxcarbazepine, and gentamicin. Creatinine on 8/8 was 0.86 and it is now 3.61. Possible acute increases in serum concentrations of medications with sedating or anticholinergic side effects is a significant component of the DDx given his acutely worsened renal function.  Will send dementia panel PENDING Will obtain EEG PENDING  ?Demyelination lesions Imaging with some concern for possible demyelination CT Cervical imaging for further evaluation is PENDING  Other Stroke Risk Factors Current Cigarette smoker, advised to stop smoking Hx stroke/TIA  Other Active Problems UTI, management per primary team  Hospital day # 1  This patient was seen and evaluated with Dr. Pearlean Brownie. He directed the plan of care.   Shon Hale, NP-C   STROKE MD NOTE :  I have personally obtained history,examined this patient, reviewed notes, independently viewed imaging studies, participated in medical decision making and plan of care.ROS completed by me personally and pertinent positives fully documented  I have made any additions or clarifications directly to the above note. Agree with note above.  Patient presented with altered mental status in the setting of urinary tract infection and uncontrolled hypertension.  MRI scan shows small left subcortical ischemic right cerebellar lacunar infarct patient has prior history of lacunar strokes as well as hypertensive intracerebral hemorrhage with residual deficits and at baseline is wheelchair-bound with poor quality of life.  Recommend aspirin and Plavix for 3 weeks followed by Plavix alone and aggressive risk factor modification.  Check carotid ultrasound and transcranial Doppler studies.  Continue ongoing therapies.  Long discussion with patient and son at the bedside and answered questions.  Discussed with Dr.Ghimire.  Greater than 50% time during this 50-minute visit was spent on counseling and coordination of care about his lacunar strokes and discussion about stroke evaluation, prevention and treatment and discussion with care team  Delia Heady, MD Medical Director [facility] Pager: [phone] 11/01/2021 4:49 PM  To contact Stroke Continuity provider, please refer to [email or web address]. After hours, contact General Neurology

## 2021-11-01 NOTE — Procedures (Signed)
History: 61 year old being evaluated for altered mental status  Sedation: None  Technique: This EEG was acquired with electrodes placed according to the International 10-20 electrode system (including Fp1, Fp2, F3, F4, C3, C4, P3, P4, O1, O2, T3, T4, T5, T6, A1, A2, Fz, Cz, Pz). The following electrodes were missing or displaced: none.   Background: There is a posterior dominant rhythm of 8 Hz which is seen bilaterally and is well sustained.  In addition there is diffuse generalized irregular slow activity intruding into the background throughout the study.    Photic stimulation: Physiologic driving is not performed  EEG Abnormalities: 1) generalized irregular slow activity  Clinical Interpretation: This EEG is consistent with a mild generalized nonspecific cerebral dysfunction (encephalopathy). There was no seizure or seizure predisposition recorded on this study. Please note that lack of epileptiform activity on EEG does not preclude the possibility of epilepsy.   Ritta Slot, MD [facility] [phone]  If 7pm- 7am, please page neurology on call as listed in AMION.

## 2021-11-01 NOTE — Evaluation (Signed)
Physical Therapy Evaluation Patient Details Name: Brent Young MRN: [id number] DOB: September 11, 1960 Today's Date: 11/01/2021  History of Present Illness  Pt is a 61 y/o M admitted 8/11 after unwitnessed fall. MRI revealing several acute/subacute infarcts involving different vascular distributions bilaterally. 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   Clinical Impression  Pt has been in LTC at [place] since 1/23 and w/c bound. Pt reports propelling w/c on his own but was using a hoyer lift for transfers requiring assist for all ADLs and mobility but was able to feed himself. Pt now maxAx2 for all mobility with inability to maintain EOB balance with strong R lateral posterior lean. Pt unable to use either UE functionally and only has functional strength left in his R LE. Pt to benefit from SNF rehab post d/c to improve ability to mobilize self in w/c and to improve sitting balance to improve stability during ADLs. Acute PT to cont to follow.     Recommendations for follow up therapy are one component of a multi-disciplinary discharge planning process, led by the attending physician.  Recommendations may be updated based on patient status, additional functional criteria and insurance authorization.  Follow Up Recommendations Skilled nursing-short term rehab (<3 hours/day) Can patient physically be transported by private vehicle: No    Assistance Recommended at Discharge Frequent or constant Supervision/Assistance  Patient can return home with the following  Two people to help with walking and/or transfers;A lot of help with bathing/dressing/bathroom;Assistance with cooking/housework;Assistance with feeding;Direct supervision/assist for medications management;Direct supervision/assist for financial management;Assist for transportation;Help with stairs or ramp for entrance    Equipment Recommendations Other (comment)  (defer to next venue)  Recommendations for Other Services       Functional Status Assessment Patient has had a recent decline in their functional status and/or demonstrates limited ability to make significant improvements in function in a reasonable and predictable amount of time     Precautions / Restrictions Precautions Precautions: Fall Restrictions Weight Bearing Restrictions: No      Mobility  Bed Mobility Overal bed mobility: Needs Assistance Bed Mobility: Rolling, Supine to Sit Rolling: Max assist, +2 for physical assistance Sidelying to sit: Max assist, +2 for physical assistance       General bed mobility comments: max A +2 for trunk elevation and bringing BLE's off of bed, pt unable to use bilat UEs functionally, pt dependent for trunk elevation to EOB, pt able to manage R LE off EOB but dep for L LE    Transfers Overall transfer level: Needs assistance Equipment used: 2 person hand held assist (face to face transfer with use of bed pad) Transfers: Sit to/from Stand, Bed to chair/wheelchair/BSC Sit to Stand: Max assist, +2 physical assistance, Total assist          Lateral/Scoot Transfers: Total assist, +2 physical assistance General transfer comment: pt unable to use Bilat UEs functionally due to weakness and inability to sequence/process commmands given by therapist. performed 3 lateral scoots to transfer from EOB to drop arm recliner towards the R with max diretional verbal cues and totalAx2 via bed pad below pt's bottom to clear pt's bottom to scoot    Ambulation/Gait               General Gait Details: pt non-ambulatory  Stairs            Wheelchair Mobility    Modified Rankin (Stroke Patients Only) Modified Rankin (  Stroke Patients Only) Pre-Morbid Rankin Score: Severe disability Modified Rankin: Severe disability     Balance Overall balance assessment: Needs assistance Sitting-balance support: Feet supported, Bilateral upper extremity  supported Sitting balance-Leahy Scale: Poor Sitting balance - Comments: posterior and R lateral lean   Standing balance support: During functional activity Standing balance-Leahy Scale: Zero Standing balance comment: reliant on 2 person max A                             Pertinent Vitals/Pain Pain Assessment Pain Assessment: No/denies pain    Home Living Family/patient expects to be discharged to:: Skilled nursing facility (LTC at [facility] since 1/23)   Available Help at Discharge: Skilled Nursing Facility Type of Home: Skilled Nursing Facility             Additional Comments: transferred via hoyer lift, uses w/c primarily, able to self feed but needs assist for all other ADLs    Prior Function Prior Level of Function : Needs assist       Physical Assist : ADLs (physical);Mobility (physical) Mobility (physical): Transfers ADLs (physical): Grooming;Bathing;Toileting;Dressing;IADLs Mobility Comments: using hoyer lift for transfers, and w/c at baseline ADLs Comments: states he receives assist for ADLs, able to self feed     Hand Dominance   Dominant Hand: Right    Extremity/Trunk Assessment   Upper Extremity Assessment Upper Extremity Assessment: Defer to OT evaluation    Lower Extremity Assessment Lower Extremity Assessment: RLE deficits/detail;LLE deficits/detail RLE Deficits / Details: pt grossly 4/5 however unable to sequence to assist with transfers LLE Deficits / Details: quad 1/5, no DF/PF, hip flexor 1/5 LLE Sensation: decreased light touch;decreased proprioception LLE Coordination: decreased gross motor    Cervical / Trunk Assessment Cervical / Trunk Assessment: Normal  Communication   Communication: No difficulties  Cognition Arousal/Alertness: Awake/alert Behavior During Therapy: WFL for tasks assessed/performed Overall Cognitive Status: Impaired/Different from baseline Area of Impairment: Attention, Memory, Following commands,  Safety/judgement, Awareness, Problem solving, Orientation                 Orientation Level: Time, Place, Situation ("another rehab center") Current Attention Level: Focused Memory: Decreased short-term memory Following Commands: Follows one step commands with increased time, Follows one step commands inconsistently Safety/Judgement: Decreased awareness of safety, Decreased awareness of deficits Awareness: Intellectual Problem Solving: Slow processing, Requires verbal cues, Decreased initiation, Difficulty sequencing, Requires tactile cues General Comments: pt thinking he is at a different rehab facility, pt with decreased insight to deficits stating "I know I can walk but they won't help me", pt with unrealistic expectations/goals, pt is pleasant and cooperative but very impaired command following        General Comments General comments (skin integrity, edema, etc.): SpO2 at 88% on RA, SPO2 at 93% on 2Lo2 via [place]    Exercises     Assessment/Plan    PT Assessment Patient needs continued PT services  PT Problem List Decreased strength;Decreased range of motion;Decreased activity tolerance;Decreased balance;Decreased mobility;Decreased coordination;Decreased cognition;Decreased safety awareness;Decreased knowledge of precautions       PT Treatment Interventions Functional mobility training;Therapeutic activities;Therapeutic exercise;Balance training;Neuromuscular re-education;Cognitive remediation;Wheelchair mobility training    PT Goals (Current goals can be found in the Care Plan section)  Acute Rehab PT Goals Patient Stated Goal: to go back to "[facility] so I can learn to walk" PT Goal Formulation: With patient Time For Goal Achievement: 11/15/21 Potential to Achieve Goals: Fair    Frequency Min 2X/week  Co-evaluation PT/OT/SLP Co-Evaluation/Treatment: Yes Reason for Co-Treatment: Complexity of the patient's impairments (multi-system involvement) PT goals  addressed during session: Mobility/safety with mobility         AM-PAC PT "6 Clicks" Mobility  Outcome Measure Help needed turning from your back to your side while in a flat bed without using bedrails?: Total Help needed moving from lying on your back to sitting on the side of a flat bed without using bedrails?: Total Help needed moving to and from a bed to a chair (including a wheelchair)?: Total Help needed standing up from a chair using your arms (e.g., wheelchair or bedside chair)?: Total Help needed to walk in hospital room?: Total Help needed climbing 3-5 steps with a railing? : Total 6 Click Score: 6    End of Session Equipment Utilized During Treatment: Gait belt Activity Tolerance: Patient limited by fatigue Patient left: in chair;with call bell/phone within reach Nurse Communication: Mobility status;Need for lift equipment PT Visit Diagnosis: Muscle weakness (generalized) (M62.81)    Time: 7824-2353 PT Time Calculation (min) (ACUTE ONLY): 30 min   Charges:   PT Evaluation $PT Eval Moderate Complexity: 1 Mod          Lewis Shock, PT, DPT Acute Rehabilitation Services Secure chat preferred Office #: [phone]   Iona Hansen 11/01/2021, 11:41 AM

## 2021-11-01 NOTE — Progress Notes (Signed)
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  Patient is poor historian.  Recently treated for UTI with gentamicin.  In the emergency room hemodynamically stable, found to have acute kidney injury.  Also found to have several acute or more subacute to small infarcts involving multiple vascular territories.  Seen by neurology and admitted as a stroke.  Skeletal survey negative including CT head and CT spine.   Assessment & Plan:  Acute multiple territory ischemic stroke: Suspect embolism.  Also suspect demyelinating disease as differential. Clinical findings, altered mental status.  Fall.  More weakness on the right side with already weakness on the left side. CT head findings, no acute findings.  Chronic small vessel disease. MRI of the brain, several acute and subacute small infarct involving multiple vascular distributions.  Suspect demyelination also. Carotid Doppler, transcranial Doppler pending.  Angiogram not done because of abnormal kidney functions. 2D echocardiogram, normal ejection fraction.  Severe left concentric ventricular hypertrophy.  Grade 1 diastolic dysfunction.  No evidence of intracardiac thrombus or shunt. Antiplatelet therapy, patient on aspirin 325 mg daily before admission.  Will resume today. LDL 45.  On atorvastatin 40 mg daily.  Resume.  Hemoglobin A1c, 5.3.  Well-controlled.  On metformin. DVT prophylaxis, Lovenox. Therapy recommendations, skilled nursing facility. Continue to work with PT OT and speech. Neurology is continuing to follow-up.  May need more studies, MRI cervical spine  pending to rule out demyelinating disease.  Anticipate improvement of renal functions next few days. Resume Trileptal and gabapentin.  Essential hypertension: Blood pressure stable.  Resume metoprolol and amlodipine.  Continue to hold ARB.  Type 2 diabetes: Well-controlled.  On metformin and Jardiance.  Hold.  Start SSI.  Recently treated UTI: Urinalysis is clear.  Discontinue gentamicin altogether.  Acute kidney injury: Recent normal kidney functions. Patient on losartan, irbesartan hydrochlorothiazide and metformin.  Recently treated with gentamicin. Renal ultrasound essentially normal.  Renal functions already improving and trending down.  We will continue maintenance IV fluids today.  Urine output is adequate.  Chronic debility and disability along with new neurological findings: Will need more aggressive rehab.   DVT prophylaxis: enoxaparin (LOVENOX) injection 30 mg Start: 10/31/21 1100   Code Status: Full code Family Communication: Son at the bedside.  Updated. Disposition Plan: Status is: Inpatient Remains inpatient appropriate because: Severe neurological deficits.  Investigations.     Consultants:  Neurology  Procedures:  None  Antimicrobials:  None   Subjective: Patient seen at the bedside.  Poor historian but tells me that he is more weak than usual.  His son arrived to the bedside and we were able to keep up more conversation even with the patient with his son at the bedside.  Patient's son tells me that he has been not doing well at least 6 weeks now, his condition continues to worsen.  He is not getting any therapies and looking more weaker and weaker day by day.  Objective: Vitals:   10/31/21 2332 11/01/21 0425 11/01/21 0538 11/01/21 0835  BP: (!) 148/80 (!) 160/84  (!) 170/75  Pulse: 100 89  (!) 102  Resp: 18 14  17   Temp: 98.2 F (  36.8 C) 98.4 F (36.9 C)  98.4 F (36.9 C)  TempSrc: Oral Oral  Oral  SpO2: 97% 97%  100%  Weight:   90.7 kg   Height:         Intake/Output Summary (Last 24 hours) at 11/01/2021 1051 Last data filed at 11/01/2021 0835 Gross per 24 hour  Intake 2255.87 ml  Output 1250 ml  Net 1005.87 ml   Filed Weights   10/31/21 1059 10/31/21 1928 11/01/21 0538  Weight: 114.2 kg 90.1 kg 90.7 kg    Examination:  General exam: Appears calm and comfortable  Frail and debilitated.  Chronically sick looking. Respiratory system: Clear to auscultation. Respiratory effort normal. Cardiovascular system: S1 & S2 heard, RRR. No pedal edema. Gastrointestinal system: Abdomen is nondistended, soft and nontender. No organomegaly or masses felt. Normal bowel sounds heard. Central nervous system: Alert and awake.   Mostly oriented to place and person.  Not oriented to time and situation.   His speech is clear.  Some delayed in conversation.   Right upper extremity is 3/5, right lower extremity is 4/5.   Left upper extremity is 3/5, left lower extremity 0/5.      Data Reviewed: I have personally reviewed following labs and imaging studies  CBC: Recent Labs  Lab 10/28/21 2323 10/31/21 0623 11/01/21 0211  WBC 11.8* 13.1* 8.6  NEUTROABS 6.5 7.9*  --   HGB 12.9* 13.0 11.2*  HCT 38.4* 39.9 34.1*  MCV 89.3 92.1 89.7  PLT 323 297 279   Basic Metabolic Panel: Recent Labs  Lab 10/28/21 2323 10/31/21 0623 11/01/21 0211  NA 141 141 142  K 3.7 4.3 3.5  CL 100 100 104  CO2 28 27 30   GLUCOSE 81 97 81  BUN 18 38* 30*  CREATININE 0.86 3.61* 2.11*  CALCIUM 9.3 8.9 8.6*   GFR: Estimated Creatinine Clearance: 39.8 mL/min (A) (by C-G formula based on SCr of 2.11 mg/dL (H)). Liver Function Tests: Recent Labs  Lab 10/28/21 2323 10/31/21 0623  AST 13* 20  ALT 14 14  ALKPHOS 108 103  BILITOT 0.7 0.7  PROT 8.2* 7.8  ALBUMIN 4.4 4.3   No results for input(s): "LIPASE", "AMYLASE" in the last 168 hours. No results for input(s): "AMMONIA" in the last 168 hours. Coagulation Profile: No results for input(s): "INR", "PROTIME"  in the last 168 hours. Cardiac Enzymes: No results for input(s): "CKTOTAL", "CKMB", "CKMBINDEX", "TROPONINI" in the last 168 hours. BNP (last 3 results) No results for input(s): "PROBNP" in the last 8760 hours. HbA1C: Recent Labs    10/31/21 0825  HGBA1C 5.3   CBG: No results for input(s): "GLUCAP" in the last 168 hours. Lipid Profile: Recent Labs    11/01/21 0211  CHOL 93  HDL 28*  LDLCALC 45  TRIG 98  CHOLHDL 3.3   Thyroid Function Tests: No results for input(s): "TSH", "T4TOTAL", "FREET4", "T3FREE", "THYROIDAB" in the last 72 hours. Anemia Panel: No results for input(s): "VITAMINB12", "FOLATE", "FERRITIN", "TIBC", "IRON", "RETICCTPCT" in the last 72 hours. Sepsis Labs: No results for input(s): "PROCALCITON", "LATICACIDVEN" in the last 168 hours.  Recent Results (from the past 240 hour(s))  Urine Culture     Status: None   Collection Time: 10/28/21 10:19 PM   Specimen: Urine, Clean Catch  Result Value Ref Range Status   Specimen Description   Final    URINE, CLEAN CATCH Performed at [facility], [address], [place], [place]    Special Requests  Final    NONE Performed at [facility], [address] [place], [place] [address]    Culture   Final    NO GROWTH Performed at [facility], [address] [place], [place] [address]    Report Status 10/30/2021 FINAL  Final  MRSA Next Gen by PCR, Nasal     Status: None   Collection Time: 11/01/21  7:41 AM   Specimen: Nasal Mucosa; Nasal Swab  Result Value Ref Range Status   MRSA by PCR Next Gen NOT DETECTED NOT DETECTED Final    Comment: (NOTE) The GeneXpert MRSA Assay (FDA approved for NASAL specimens only), is one component of a comprehensive MRSA colonization surveillance program. It is not intended to diagnose MRSA infection nor to guide or monitor treatment for MRSA infections. Test performance is not FDA approved in patients less than 64  years old. Performed at [facility], [address], [place], [place] [address]          Radiology Studies: ECHOCARDIOGRAM COMPLETE  Result Date: 10/31/2021    ECHOCARDIOGRAM REPORT   Patient Name:   TYWON NIDAY Date of Exam: 10/31/2021 Medical Rec #:  [id number]        Height:       73.0 in Accession #:    [id number]       Weight:       251.8 lb Date of Birth:  Mar 02, 1961        BSA:          2.373 m Patient Age:    61 years         BP:           144/79 mmHg Patient Gender: M                HR:           90 bpm. Exam Location:  Inpatient Procedure: 2D Echo, Cardiac Doppler and Color Doppler Indications:    Stroke  History:        Patient has prior history of Echocardiogram examinations, most                 recent 02/17/2021. Risk Factors:Diabetes and Hypertension.  Sonographer:    Eduard Roux Referring Phys: [id number] DAVID MANUEL ORTIZ IMPRESSIONS  1. Left ventricular ejection fraction, by estimation, is 65 to 70%. The left ventricle has normal function. The left ventricle has no regional wall motion abnormalities. There is severe concentric left ventricular hypertrophy. Left ventricular diastolic  parameters are consistent with Grade I diastolic dysfunction (impaired relaxation).  2. Right ventricular systolic function is normal. The right ventricular size is normal. There is mildly elevated pulmonary artery systolic pressure. The estimated right ventricular systolic pressure is 41.2 mmHg.  3. Left atrial size was severely dilated.  4. Right atrial size was mildly dilated.  5. The mitral valve is degenerative. Trivial mitral valve regurgitation. No evidence of mitral stenosis. Moderate mitral annular calcification.  6. The aortic valve has an indeterminant number of cusps. There is moderate calcification of the aortic valve. There is moderate thickening of the aortic valve. Aortic valve regurgitation is moderate by PHT but visually appears only mild. Aortic valve  sclerosis/calcification is present, without any evidence of aortic stenosis. Aortic regurgitation PHT measures 298 msec. Aortic valve Vmax measures 2.48 m/s.  7. The inferior vena cava is normal in size with greater than 50% respiratory variability, suggesting right atrial pressure of 3 mmHg. Conclusion(s)/Recommendation(s): No  intracardiac source of embolism detected on this transthoracic study. Consider a transesophageal echocardiogram to exclude cardiac source of embolism if clinically indicated. FINDINGS  Left Ventricle: Left ventricular ejection fraction, by estimation, is 65 to 70%. The left ventricle has normal function. The left ventricle has no regional wall motion abnormalities. The left ventricular internal cavity size was normal in size. There is  severe concentric left ventricular hypertrophy. Left ventricular diastolic parameters are consistent with Grade I diastolic dysfunction (impaired relaxation). Indeterminate filling pressures. Right Ventricle: The right ventricular size is normal. No increase in right ventricular wall thickness. Right ventricular systolic function is normal. There is mildly elevated pulmonary artery systolic pressure. The tricuspid regurgitant velocity is 3.09  m/s, and with an assumed right atrial pressure of 3 mmHg, the estimated right ventricular systolic pressure is 41.2 mmHg. Left Atrium: Left atrial size was severely dilated. Right Atrium: Right atrial size was mildly dilated. Pericardium: Trivial pericardial effusion is present. The pericardial effusion is posterior to the left ventricle. Mitral Valve: The mitral valve is degenerative in appearance. There is mild thickening of the mitral valve leaflet(s). There is mild calcification of the mitral valve leaflet(s). Moderate mitral annular calcification. Trivial mitral valve regurgitation. No evidence of mitral valve stenosis. Tricuspid Valve: The tricuspid valve is normal in structure. Tricuspid valve regurgitation is  trivial. No evidence of tricuspid stenosis. Aortic Valve: The aortic valve has an indeterminant number of cusps. There is moderate calcification of the aortic valve. There is moderate thickening of the aortic valve. Aortic valve regurgitation is mild to moderate. Aortic regurgitation PHT measures 298 msec. Aortic valve sclerosis/calcification is present, without any evidence of aortic stenosis. Aortic valve peak gradient measures 24.6 mmHg. Pulmonic Valve: The pulmonic valve was normal in structure. Pulmonic valve regurgitation is not visualized. No evidence of pulmonic stenosis. Aorta: The aortic root is normal in size and structure. Venous: The inferior vena cava is normal in size with greater than 50% respiratory variability, suggesting right atrial pressure of 3 mmHg. IAS/Shunts: No atrial level shunt detected by color flow Doppler.  LEFT VENTRICLE PLAX 2D LVIDd:         3.70 cm   Diastology LVIDs:         1.90 cm   LV e' lateral:   5.98 cm/s LV PW:         1.70 cm   LV E/e' lateral: 10.8 LV IVS:        1.50 cm LVOT diam:     2.00 cm LV SV:         75 LV SV Index:   32 LVOT Area:     3.14 cm  RIGHT VENTRICLE             IVC RV Basal diam:  3.25 cm     IVC diam: 1.70 cm RV S prime:     14.70 cm/s TAPSE (M-mode): 2.4 cm LEFT ATRIUM              Index        RIGHT ATRIUM           Index LA diam:        3.60 cm  1.52 cm/m   RA Area:     25.30 cm LA Vol (A2C):   127.0 ml 53.52 ml/m  RA Volume:   81.80 ml  34.47 ml/m LA Vol (A4C):   89.8 ml  37.85 ml/m LA Biplane Vol: 111.0 ml 46.78 ml/m  AORTIC VALVE  PULMONIC VALVE AV Area (Vmax): 1.75 cm     PV Vmax:       0.93 m/s AV Vmax:        248.00 cm/s  PV Peak grad:  3.4 mmHg AV Peak Grad:   24.6 mmHg LVOT Vmax:      138.00 cm/s LVOT Vmean:     85.500 cm/s LVOT VTI:       0.239 m AI PHT:         298 msec  AORTA Ao Root diam: 3.30 cm Ao Asc diam:  3.10 cm MITRAL VALVE                TRICUSPID VALVE MV Area (PHT): 5.70 cm     TR Peak grad:   38.2 mmHg  MV Decel Time: 133 msec     TR Vmax:        309.00 cm/s MV E velocity: 64.60 cm/s MV A velocity: 131.00 cm/s  SHUNTS MV E/A ratio:  0.49         Systemic VTI:  0.24 m                             Systemic Diam: 2.00 cm Armanda Magic MD Electronically signed by Armanda Magic MD Signature Date/Time: 10/31/2021/12:17:43 PM    Final    VAS US CAROTID (at [facility] and [facility] only)  Result Date: 10/31/2021 Carotid Arterial Duplex Study Patient Name:  Brent Young  Date of Exam:   10/31/2021 Medical Rec #: [id number]         Accession #:    [id number] Date of Birth: 1960/07/23         Patient Gender: M Patient Age:   2 years Exam Location:  [facility] [place] Procedure:      VAS US CAROTID Referring Phys: Onalee Hua ORTIZ --------------------------------------------------------------------------------  Indications:       CVA. Risk Factors:      Hypertension, hyperlipidemia, Diabetes. Limitations        Today's exam was limited due to patient positioning, patient                    movement. Comparison Study:  No prior studies. Performing Technologist: Chanda Busing RVT  Examination Guidelines: A complete evaluation includes B-mode imaging, spectral Doppler, color Doppler, and power Doppler as needed of all accessible portions of each vessel. Bilateral testing is considered an integral part of a complete examination. Limited examinations for reoccurring indications may be performed as noted.  Right Carotid Findings: +----------+--------+--------+--------+--------------------------+--------+           PSV cm/sEDV cm/sStenosisPlaque Description        Comments +----------+--------+--------+--------+--------------------------+--------+ CCA Prox  83      9               smooth and heterogenous            +----------+--------+--------+--------+--------------------------+--------+ CCA Distal54      11              smooth and heterogenous             +----------+--------+--------+--------+--------------------------+--------+ ICA Prox  82      19              irregular and heterogenous         +----------+--------+--------+--------+--------------------------+--------+ ICA Distal76      23  tortuous +----------+--------+--------+--------+--------------------------+--------+ ECA       109     7                                                  +----------+--------+--------+--------+--------------------------+--------+ +----------+--------+-------+--------+-------------------+           PSV cm/sEDV cmsDescribeArm Pressure (mmHG) +----------+--------+-------+--------+-------------------+ [id number]                                         +----------+--------+-------+--------+-------------------+ +---------+--------+--+--------+-+---------+ VertebralPSV cm/s47EDV cm/s4Antegrade +---------+--------+--+--------+-+---------+  Left Carotid Findings: +----------+--------+--------+--------+--------------------------+--------+           PSV cm/sEDV cm/sStenosisPlaque Description        Comments +----------+--------+--------+--------+--------------------------+--------+ CCA Prox  94      16              smooth and heterogenous            +----------+--------+--------+--------+--------------------------+--------+ CCA Distal47      9               smooth and heterogenous            +----------+--------+--------+--------+--------------------------+--------+ ICA Prox  79      24              irregular and heterogenous         +----------+--------+--------+--------+--------------------------+--------+ ICA Distal113     38                                        tortuous +----------+--------+--------+--------+--------------------------+--------+ ECA       96      0                                                   +----------+--------+--------+--------+--------------------------+--------+ +----------+--------+--------+--------+-------------------+           PSV cm/sEDV cm/sDescribeArm Pressure (mmHG) +----------+--------+--------+--------+-------------------+ Subclavian100                                         +----------+--------+--------+--------+-------------------+ +---------+--------+--+--------+--+---------+ VertebralPSV cm/s63EDV cm/s26Antegrade +---------+--------+--+--------+--+---------+   Summary: Right Carotid: Velocities in the right ICA are consistent with a 1-39% stenosis. Left Carotid: Velocities in the left ICA are consistent with a 1-39% stenosis. Vertebrals: Bilateral vertebral arteries demonstrate antegrade flow. *See table(s) above for measurements and observations.     Preliminary    MR BRAIN WO CONTRAST  Result Date: 10/31/2021 CLINICAL DATA:  Neuro deficit, acute, stroke suspected RUE weakness EXAM: MRI HEAD WITHOUT CONTRAST TECHNIQUE: Multiplanar, multiecho pulse sequences of the brain and surrounding structures were obtained without intravenous contrast. COMPARISON:  02/18/2021 FINDINGS: Brain: Small foci of variably reduced diffusion bilaterally including involvement of the centrum semiovale, left lentiform nucleus, and right brachium pontis. Patchy and confluent areas of T2 hyperintensity in the supratentorial and pontine white matter likely reflect similar chronic microvascular ischemic changes. There are multiple chronic small vessel infarcts involving the central white matter, deep gray nuclei, pons, and cerebellum. Wallerian degeneration  is noted along the right cerebral peduncle. Few scattered foci of susceptibility likely reflect chronic microhemorrhages. Prominence of the ventricles and sulci reflects similar parenchymal volume loss. There is no intracranial mass or mass effect there is no hydrocephalus or extra-axial fluid collection. Vascular: Major vessel flow  voids at the skull base are preserved. Skull and upper cervical spine: Normal marrow signal is preserved. Sinuses/Orbits: Paranasal sinuses are aerated. Orbits are unremarkable. Other: Sella is unremarkable.  Mastoid air cells are clear. IMPRESSION: Several acute and more subacute small infarcts involving different vascular distributions bilaterally. Chronic microvascular ischemic changes with multiple chronic small vessel infarcts. Electronically Signed   By: Guadlupe Spanish M.D.   On: 10/31/2021 09:15   US RENAL  Result Date: 10/31/2021 CLINICAL DATA:  [id number] with acute renal failure. EXAM: RENAL / URINARY TRACT ULTRASOUND COMPLETE COMPARISON:  None Available. FINDINGS: Right Kidney: Renal measurements: 12.3 x 7.8 x 5.5 cm = volume: 276.7 mL. Echogenicity within normal limits. No mass, stones or hydronephrosis visualized. Left Kidney: Renal measurements: 11.2 x 5.6 x 5.9 cm = volume: 192.2 mL. Echogenicity within normal limits. No mass, stones or hydronephrosis visualized. Bladder: Appears normal for degree of bladder distention. Ureteral jets were not seen during the exam. Other: None. IMPRESSION: 1. Sonographically unremarkable kidneys. 2. Unremarkable bladder for degree of distension with ureteral jets not seen during the study. Electronically Signed   By: Almira Bar M.D.   On: 10/31/2021 07:43   CT Head Wo Contrast  Result Date: 10/31/2021 CLINICAL DATA:  Unwitnessed fall EXAM: CT HEAD WITHOUT CONTRAST CT CERVICAL SPINE WITHOUT CONTRAST TECHNIQUE: Multidetector CT imaging of the head and cervical spine was performed following the standard protocol without intravenous contrast. Multiplanar CT image reconstructions of the cervical spine were also generated. RADIATION DOSE REDUCTION: This exam was performed according to the departmental dose-optimization program which includes automated exposure control, adjustment of the mA and/or kV according to patient size and/or use of iterative reconstruction  technique. COMPARISON:  03/19/2021 head CT inferior FINDINGS: CT HEAD FINDINGS Brain: No evidence of acute infarction, hemorrhage, hydrocephalus, extra-axial collection or mass lesion/mass effect. Chronic small vessel ischemic gliosis in the cerebral white matter with chronic small vessel infarcts most notable in the right corona radiata and thalamus. Premature brain atrophy. Vascular: No hyperdense vessel or unexpected calcification. Skull: Normal. Negative for fracture or focal lesion. Sinuses/Orbits: No acute finding. CT CERVICAL SPINE FINDINGS Alignment: Straightening of cervical lordosis. Skull base and vertebrae: No acute fracture. No primary bone lesion or focal pathologic process. Soft tissues and spinal canal: No prevertebral fluid or swelling. No visible canal hematoma. Disc levels: Generalized degenerative disc space narrowing, endplate degeneration, and ridging. Foraminal narrowing greatest at C3-4. Upper chest: No evidence of injury IMPRESSION: 1. No evidence of acute intracranial or cervical spine injury. 2. Notable chronic small vessel ischemia. Electronically Signed   By: Tiburcio Pea M.D.   On: 10/31/2021 06:10   CT Cervical Spine Wo Contrast  Result Date: 10/31/2021 CLINICAL DATA:  Unwitnessed fall EXAM: CT HEAD WITHOUT CONTRAST CT CERVICAL SPINE WITHOUT CONTRAST TECHNIQUE: Multidetector CT imaging of the head and cervical spine was performed following the standard protocol without intravenous contrast. Multiplanar CT image reconstructions of the cervical spine were also generated. RADIATION DOSE REDUCTION: This exam was performed according to the departmental dose-optimization program which includes automated exposure control, adjustment of the mA and/or kV according to patient size and/or use of iterative reconstruction technique. COMPARISON:  03/19/2021 head CT inferior FINDINGS: CT HEAD FINDINGS Brain:  No evidence of acute infarction, hemorrhage, hydrocephalus, extra-axial collection or  mass lesion/mass effect. Chronic small vessel ischemic gliosis in the cerebral white matter with chronic small vessel infarcts most notable in the right corona radiata and thalamus. Premature brain atrophy. Vascular: No hyperdense vessel or unexpected calcification. Skull: Normal. Negative for fracture or focal lesion. Sinuses/Orbits: No acute finding. CT CERVICAL SPINE FINDINGS Alignment: Straightening of cervical lordosis. Skull base and vertebrae: No acute fracture. No primary bone lesion or focal pathologic process. Soft tissues and spinal canal: No prevertebral fluid or swelling. No visible canal hematoma. Disc levels: Generalized degenerative disc space narrowing, endplate degeneration, and ridging. Foraminal narrowing greatest at C3-4. Upper chest: No evidence of injury IMPRESSION: 1. No evidence of acute intracranial or cervical spine injury. 2. Notable chronic small vessel ischemia. Electronically Signed   By: Tiburcio Pea M.D.   On: 10/31/2021 06:10   DG Chest 1 View  Result Date: 10/31/2021 CLINICAL DATA:  Unwitnessed fall . EXAM: CHEST  1 VIEW COMPARISON:  10/28/2021 FINDINGS: Mild left basilar atelectasis. Lungs are otherwise clear. No pneumothorax or pleural effusion. Cardiac size within normal limits. No acute bone abnormality IMPRESSION: No active disease. Electronically Signed   By: Helyn Numbers M.D.   On: 10/31/2021 04:41        Scheduled Meds:  amLODipine  10 mg Oral Daily   aspirin EC  325 mg Oral Daily   atorvastatin  40 mg Oral Daily   enoxaparin (LOVENOX) injection  30 mg Subcutaneous Daily   gabapentin  300 mg Oral TID   insulin aspart  0-5 Units Subcutaneous QHS   insulin aspart  0-9 Units Subcutaneous TID WC   metoprolol succinate  50 mg Oral Daily   mometasone-formoterol  2 puff Inhalation BID   Oxcarbazepine  300 mg Oral TID   pantoprazole  40 mg Oral Daily   traZODone  50 mg Oral QHS   Continuous Infusions:  lactated ringers 125 mL/hr at 11/01/21 0405      LOS: 1 day    Time spent: 35 minutes    Dorcas Carrow, MD [facility] Pager [phone]

## 2021-11-02 ENCOUNTER — Inpatient Hospital Stay (HOSPITAL_COMMUNITY): Payer: Medicaid Other

## 2021-11-02 DIAGNOSIS — E1169 Type 2 diabetes mellitus with other specified complication: Secondary | ICD-10-CM | POA: Diagnosis not present

## 2021-11-02 DIAGNOSIS — Z7409 Other reduced mobility: Secondary | ICD-10-CM

## 2021-11-02 DIAGNOSIS — E876 Hypokalemia: Secondary | ICD-10-CM

## 2021-11-02 DIAGNOSIS — I10 Essential (primary) hypertension: Secondary | ICD-10-CM

## 2021-11-02 DIAGNOSIS — E782 Mixed hyperlipidemia: Secondary | ICD-10-CM

## 2021-11-02 DIAGNOSIS — E669 Obesity, unspecified: Secondary | ICD-10-CM

## 2021-11-02 DIAGNOSIS — I2510 Atherosclerotic heart disease of native coronary artery without angina pectoris: Secondary | ICD-10-CM | POA: Diagnosis not present

## 2021-11-02 DIAGNOSIS — N179 Acute kidney failure, unspecified: Secondary | ICD-10-CM | POA: Diagnosis not present

## 2021-11-02 DIAGNOSIS — I639 Cerebral infarction, unspecified: Secondary | ICD-10-CM | POA: Diagnosis not present

## 2021-11-02 LAB — BASIC METABOLIC PANEL
Anion gap: 11 (ref 5–15)
BUN: 14 mg/dL (ref 8–23)
CO2: 29 mmol/L (ref 22–32)
Calcium: 9.1 mg/dL (ref 8.9–10.3)
Chloride: 103 mmol/L (ref 98–111)
Creatinine, Ser: 1.04 mg/dL (ref 0.61–1.24)
GFR, Estimated: >60 mL/min (ref >60)
Glucose, Bld: 95 mg/dL (ref 70–99)
Potassium: 3.4 mmol/L — ABNORMAL LOW (ref 3.5–5.1)
Sodium: 143 mmol/L (ref 135–145)

## 2021-11-02 LAB — PHOSPHORUS: Phosphorus: 2.5 mg/dL (ref 2.5–4.6)

## 2021-11-02 LAB — GLUCOSE, CAPILLARY
Glucose-Capillary: 86 mg/dL (ref 70–99)
Glucose-Capillary: 126 mg/dL — ABNORMAL HIGH (ref 70–99)
Glucose-Capillary: 137 mg/dL — ABNORMAL HIGH (ref 70–99)
Glucose-Capillary: 158 mg/dL — ABNORMAL HIGH (ref 70–99)

## 2021-11-02 LAB — MAGNESIUM: Magnesium: 1.2 mg/dL — ABNORMAL LOW (ref 1.7–2.4)

## 2021-11-02 MED ORDER — POTASSIUM CHLORIDE CRYS ER 20 MEQ PO TBCR
40.0000 meq | EXTENDED_RELEASE_TABLET | Freq: Once | ORAL | Status: AC
  Administered 2021-11-02: 40 meq via ORAL
  Filled 2021-11-02: qty 2

## 2021-11-02 MED ORDER — HYDRALAZINE HCL 10 MG PO TABS: 10.0000 mg | ORAL_TABLET | Freq: Four times a day (QID) | ORAL | Status: DC | PRN

## 2021-11-02 MED ORDER — MAGNESIUM OXIDE -MG SUPPLEMENT 400 (240 MG) MG PO TABS
400.0000 mg | ORAL_TABLET | Freq: Two times a day (BID) | ORAL | Status: DC
  Administered 2021-11-02 – 2021-11-06 (×10): 400 mg via ORAL
  Filled 2021-11-02 (×10): qty 1

## 2021-11-02 MED ORDER — MAGNESIUM SULFATE 2 GM/50ML IV SOLN
2.0000 g | Freq: Once | INTRAVENOUS | Status: AC
  Administered 2021-11-02: 2 g via INTRAVENOUS
  Filled 2021-11-02: qty 50

## 2021-11-02 MED ORDER — MELATONIN 5 MG PO TABS
10.0000 mg | ORAL_TABLET | Freq: Once | ORAL | Status: AC
  Administered 2021-11-02: 10 mg via ORAL
  Filled 2021-11-02: qty 2

## 2021-11-02 MED ORDER — HALOPERIDOL LACTATE 5 MG/ML IJ SOLN
1.0000 mg | Freq: Once | INTRAMUSCULAR | Status: AC
Start: 2021-11-02 — End: 2021-11-02
  Administered 2021-11-02: 1 mg via INTRAVENOUS
  Filled 2021-11-02: qty 1

## 2021-11-02 NOTE — Plan of Care (Signed)
Patient alert to self. Vital signs are stable. Patient did not sleep at all during shift. MD notified due to this a one time dose of Melatonin was ordered but ineffective. Will pass off to incoming shift.  Problem: Elimination: Goal: Will not experience complications related to bowel motility Outcome: Progressing Goal: Will not experience complications related to urinary retention Outcome: Progressing   Problem: Safety: Goal: Ability to remain free from injury will improve Outcome: Progressing   Problem: Skin Integrity: Goal: Risk for impaired skin integrity will decrease Outcome: Progressing

## 2021-11-02 NOTE — TOC Initial Note (Signed)
Transition of Care [facility]) - Initial/Assessment Note    Patient Details  Name: Brent Young MRN: [id number] Date of Birth: 01-11-1961  Transition of Care [facility]) CM/SW Contact:    Lorri Frederick, LCSW Phone Number: 11/02/2021, 10:19 AM  Clinical Narrative:     Pt oriented x1 per chart, pt was able to engage in conversation regarding DC plan.  Pt confirms he has been living at [facility] for several months, pt is interested in getting PT there, says he has not been getting PT.  Permission given to speak with mother Nita Sells, son [name], sister Olegario Messier.  Per chart, MD had spoken with son [name].  CSW able to reach [place] by phone.  He also confirms pt has been at [place] long term, reports they have been asking for a transfer to a new facility, states they are still interested in in this.  Discussed that CSW can attempt to see if another facility would accept but that [facility] payer and need for LTC can complicate this, cannot hold at acute care just to locate new placement if existing is in place.    Referral sent out for SNF.              Expected Discharge Plan: Skilled Nursing Facility Barriers to Discharge: Continued Medical Work up, SNF Pending bed offer   Patient Goals and CMS Choice Patient states their goals for this hospitalization and ongoing recovery are:: "good walking"   Choice offered to / list presented to : Patient, Adult Children (son [name])  Expected Discharge Plan and Services Expected Discharge Plan: Skilled Nursing Facility In-house Referral: Clinical Social Work   Post Acute Care Choice: Skilled Nursing Facility Living arrangements for the past 2 months: Skilled Nursing Facility [facility])                                      Prior Living Arrangements/Services Living arrangements for the past 2 months: 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) Lives with:: Facility Resident Patient language and need for interpreter reviewed:: Yes Do you  feel safe going back to the place where you live?: Yes      Need for Family Participation in Patient Care: Yes (Comment) Care giver support system in place?: Yes (comment) Current home services: Other (comment) (na) Criminal Activity/Legal Involvement Pertinent to Current Situation/Hospitalization: No - Comment as needed  Activities of Daily Living Home Assistive Devices/Equipment: Wheelchair, Nurse, adult ADL Screening (condition at time of admission) Patient's cognitive ability adequate to safely complete daily activities?: Yes Is the patient deaf or have difficulty hearing?: No Does the patient have difficulty seeing, even when wearing glasses/contacts?: No Does the patient have difficulty concentrating, remembering, or making decisions?: No Patient able to express need for assistance with ADLs?: Yes Does the patient have difficulty dressing or bathing?: No (pt states he bathes and dresses himself) Independently performs ADLs?: No Communication: Independent Dressing (OT): Independent Grooming: Independent Feeding: Independent Bathing: Independent Toileting: Needs assistance Is this a change from baseline?: Pre-admission baseline In/Out Bed: Needs assistance Is this a change from baseline?: Pre-admission baseline Walks in Home: Needs assistance Is this a change from baseline?: Pre-admission baseline Does the patient have difficulty walking or climbing stairs?: Yes Weakness of Legs: Left (hx of cva) Weakness of Arms/Hands: None  Permission Sought/Granted Permission sought to share information with : Family Supports Permission granted to share information with : Yes, Verbal Permission Granted  Share Information  with NAME: mother Macon Large, son [name], sister Olegario Messier  Permission granted to share info w AGENCY: SNF        Emotional Assessment Appearance:: Appears stated age Attitude/Demeanor/Rapport: Engaged Affect (typically observed): Appropriate, Pleasant Orientation: :  Oriented to Self      Admission diagnosis:  Acute kidney injury (HCC) [N17.9] Fall, initial encounter [W19.XXXA] Acute CVA (cerebrovascular accident) (HCC) [I63.9] Severe comorbid illness [R69] Cerebrovascular accident (CVA), unspecified mechanism (HCC) [I63.9] Patient Active Problem List   Diagnosis Date Noted   Hypomagnesemia 11/02/2021   Acute CVA (cerebrovascular accident) (HCC) 10/31/2021   Impaired gait and mobility 10/31/2021   Impaired functional mobility, balance, gait, and endurance 10/31/2021   AKI (acute kidney injury) (HCC) 10/31/2021   Class 1 obesity 10/31/2021   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   Shoulder pain, right 02/19/2021   Demand ischemia (HCC) 02/19/2021   Hyperlipidemia, unspecified 02/19/2021   Hypokalemia 02/19/2021   Leukocytosis 02/19/2021   Obesity, Class II, BMI 35-39.9 02/19/2021   Stroke (HCC) 02/17/2021   Essential hypertension 02/17/2021   Diabetes mellitus type 2 in obese (HCC) 02/17/2021   Elevated troponin 02/17/2021   Hypertensive urgency 02/29/2020   Facial droop 02/28/2020   Anemia 02/28/2020   Closed nondisplaced fracture of greater tuberosity of right humerus 01/11/2020   Acute respiratory failure with hypoxia (HCC) 01/11/2020   ASCVD (arteriosclerotic cardiovascular disease) 09/14/2018   History of 2019 novel coronavirus disease (COVID-19) 08/22/2018   Class 2 severe obesity due to excess calories with serious comorbidity and body mass index (BMI) of 35.0 to 35.9 in adult [facility]) 08/22/2018   PCP:  Grayce Sessions, NP Pharmacy:   [facility] [address] [place] [address] Phone: [phone] Fax: [phone]  [facility] [address] [place] [address] Phone: [phone] Fax: [phone]  [id number] [place], [place] - [address] [place]  [address] Phone: [phone] Fax: [phone]     Social Determinants of Health (SDOH) Interventions    Readmission Risk Interventions     No data to display

## 2021-11-02 NOTE — [place] FL2 (Signed)
[place] MEDICAID FL2 LEVEL OF CARE SCREENING TOOL     IDENTIFICATION  Patient Name: Brent Young Birthdate: 08-17-1960 Sex: male Admission Date (Current Location): 10/31/2021  [place] and [place] Number:  [name] [id number] S Facility and Address:  [facility], [address], [place], [place] [address]      Provider Number: [id number]  Attending Physician Name and Address:  Joycelyn Das, MD  Relative Name and Phone Number:  Darik, [place] Mother [phone]    Current Level of Care: Hospital Recommended Level of Care: Skilled Nursing Facility Prior Approval Number:    Date Approved/Denied:   PASRR Number: [id number]  Discharge Plan: SNF    Current Diagnoses: Patient Active Problem List   Diagnosis Date Noted   Hypomagnesemia 11/02/2021   Acute CVA (cerebrovascular accident) (HCC) 10/31/2021   Impaired gait and mobility 10/31/2021   Impaired functional mobility, balance, gait, and endurance 10/31/2021   AKI (acute kidney injury) (HCC) 10/31/2021   Class 1 obesity 10/31/2021   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   Shoulder pain, right 02/19/2021   Demand ischemia (HCC) 02/19/2021   Hyperlipidemia, unspecified 02/19/2021   Hypokalemia 02/19/2021   Leukocytosis 02/19/2021   Obesity, Class II, BMI 35-39.9 02/19/2021   Stroke (HCC) 02/17/2021   Essential hypertension 02/17/2021   Diabetes mellitus type 2 in obese (HCC) 02/17/2021   Elevated troponin 02/17/2021   Hypertensive urgency 02/29/2020   Facial droop 02/28/2020   Anemia 02/28/2020   Closed nondisplaced fracture of greater tuberosity of right humerus 01/11/2020   Acute respiratory failure with hypoxia (HCC) 01/11/2020   ASCVD (arteriosclerotic cardiovascular disease) 09/14/2018   History of 2019 novel coronavirus disease (COVID-19) 08/22/2018   Class 2 severe obesity due to excess calories with serious comorbidity and body mass index (BMI) of 35.0 to 35.9 in  adult (HCC) 08/22/2018    Orientation RESPIRATION BLADDER Height & Weight     Self, Time, Situation, Place  Normal Incontinent, External catheter Weight: 199 lb 15.3 oz (90.7 kg) Height:  6' (182.9 cm)  BEHAVIORAL SYMPTOMS/MOOD NEUROLOGICAL BOWEL NUTRITION STATUS      Continent Diet (see discharge summary)  AMBULATORY STATUS COMMUNICATION OF NEEDS Skin    (wheelchair at baseline) Verbally Normal                       Personal Care Assistance Level of Assistance  Bathing, Feeding, Dressing, Total care Bathing Assistance: Maximum assistance Feeding assistance: Limited assistance Dressing Assistance: Maximum assistance Total Care Assistance: Maximum assistance   Functional Limitations Info  Sight, Hearing, Speech Sight Info: Adequate Hearing Info: Adequate Speech Info: Adequate    SPECIAL CARE FACTORS FREQUENCY  PT (By licensed PT), OT (By licensed OT)     PT Frequency: 5x week OT Frequency: 5x week            Contractures Contractures Info: Not present    Additional Factors Info  Code Status, Allergies, Insulin Sliding Scale Code Status Info: full Allergies Info: NKA   Insulin Sliding Scale Info: Novolog, see discharge summary       Current Medications (11/02/2021):  This is the current hospital active medication list Current Facility-Administered Medications  Medication Dose Route Frequency Provider Last Rate Last Admin   acetaminophen (TYLENOL) tablet 650 mg  650 mg Oral Q6H PRN Bobette Mo, MD       Or   acetaminophen (TYLENOL) suppository 650 mg  650 mg Rectal Q6H PRN Bobette Mo, MD  albuterol (PROVENTIL) (2.5 MG/3ML) 0.083% nebulizer solution 2.5 mg  2.5 mg Nebulization Q6H PRN Dorcas Carrow, MD       amLODipine (NORVASC) tablet 10 mg  10 mg Oral Daily Dorcas Carrow, MD   10 mg at 11/01/21 1650   aspirin EC tablet 81 mg  81 mg Oral Daily Micki Riley, MD   81 mg at 11/01/21 1719   atorvastatin (LIPITOR) tablet 40 mg  40 mg  Oral Daily Dorcas Carrow, MD   40 mg at 11/01/21 1650   clopidogrel (PLAVIX) tablet 75 mg  75 mg Oral Daily Micki Riley, MD   75 mg at 11/01/21 1719   enoxaparin (LOVENOX) injection 40 mg  40 mg Subcutaneous Daily Dorcas Carrow, MD       gabapentin (NEURONTIN) capsule 300 mg  300 mg Oral TID Dorcas Carrow, MD   300 mg at 11/01/21 2046   hydrALAZINE (APRESOLINE) tablet 10 mg  10 mg Oral PRN Dorcas Carrow, MD   10 mg at 11/01/21 2049   hydrOXYzine (ATARAX) tablet 10 mg  10 mg Oral TID PRN Dorcas Carrow, MD   10 mg at 11/01/21 2342   insulin aspart (novoLOG) injection 0-5 Units  0-5 Units Subcutaneous QHS Dorcas Carrow, MD       insulin aspart (novoLOG) injection 0-9 Units  0-9 Units Subcutaneous TID WC Dorcas Carrow, MD       lactated ringers infusion   Intravenous Continuous Bobette Mo, MD 125 mL/hr at 11/02/21 0614 New Bag at 11/02/21 3159   magnesium oxide (MAG-OX) tablet 400 mg  400 mg Oral BID Pokhrel, Laxman, MD       magnesium sulfate IVPB 2 g 50 mL  2 g Intravenous Once Pokhrel, Laxman, MD       metoprolol succinate (TOPROL-XL) 24 hr tablet 50 mg  50 mg Oral Daily Dorcas Carrow, MD   50 mg at 11/01/21 1650   mometasone-formoterol (DULERA) 100-5 MCG/ACT inhaler 2 puff  2 puff Inhalation BID Dorcas Carrow, MD   2 puff at 11/02/21 4585   Oxcarbazepine (TRILEPTAL) tablet 300 mg  300 mg Oral TID Dorcas Carrow, MD   300 mg at 11/01/21 2046   pantoprazole (PROTONIX) EC tablet 40 mg  40 mg Oral Daily Dorcas Carrow, MD   40 mg at 11/01/21 1225   potassium chloride SA (KLOR-CON M) CR tablet 40 mEq  40 mEq Oral Once Pokhrel, Laxman, MD       potassium chloride SA (KLOR-CON M) CR tablet 40 mEq  40 mEq Oral Once Pokhrel, Laxman, MD       prochlorperazine (COMPAZINE) injection 10 mg  10 mg Intravenous Q6H PRN Bobette Mo, MD       traZODone (DESYREL) tablet 50 mg  50 mg Oral QHS Dorcas Carrow, MD   50 mg at 11/01/21 2046     Discharge Medications: Please see discharge  summary for a list of discharge medications.  Relevant Imaging Results:  Relevant Lab Results:   Additional Information FY#[id number]  Lorri Frederick, LCSW

## 2021-11-02 NOTE — Progress Notes (Signed)
TR[facility] night cross cover note:   I was notified by RN of the patient's insomnia, refractory to doses of trazodone as well as Vistaril administered earlier tonight, as well as request for additional sleep aid.  As he has already received an antihistamine, will refrain from Benadryl.  Rather, ordered a one-time dose of melatonin 10 mg p.o., following which the patient is still having difficulty sleeping.  As it is now after 3 AM, will refrain from ordering any additional sleep aids at this time.      Newton Pigg, DO Hospitalist

## 2021-11-02 NOTE — Progress Notes (Signed)
STROKE TEAM PROGRESS NOTE   INTERVAL HISTORY His sister is at the bedside.  Patient is more alert and interactive today.  He was not cooperative for MRI C-spine yesterday hence its not been done.  He has no complaints.  Vital signs stable.  Neurological exam unchanged.  Transcranial Doppler study was suboptimal with very poor windows Vitals:   11/01/21 2048 11/02/21 0000 11/02/21 0402 11/02/21 0941  BP: (!) 189/81 132/70 133/78 (!) 191/77  Pulse:  87 83 80  Resp:   18 20  Temp:  97.9 F (36.6 C) 98.1 F (36.7 C) 97.7 F (36.5 C)  TempSrc:  Oral Oral Oral  SpO2:  93% 93% 95%  Weight:      Height:       CBC:  Recent Labs  Lab 10/28/21 2323 10/31/21 0623 11/01/21 0211  WBC 11.8* 13.1* 8.6  NEUTROABS 6.5 7.9*  --   HGB 12.9* 13.0 11.2*  HCT 38.4* 39.9 34.1*  MCV 89.3 92.1 89.7  PLT 323 297 279   Basic Metabolic Panel:  Recent Labs  Lab 11/01/21 0211 11/02/21 0200  NA 142 143  K 3.5 3.4*  CL 104 103  CO2 30 29  GLUCOSE 81 95  BUN 30* 14  CREATININE 2.11* 1.04  CALCIUM 8.6* 9.1  MG  --  1.2*  PHOS  --  2.5   Lipid Panel:  Recent Labs  Lab 11/01/21 0211  CHOL 93  TRIG 98  HDL 28*  CHOLHDL 3.3  VLDL 20  LDLCALC 45   HgbA1c:  Recent Labs  Lab 10/31/21 0825  HGBA1C 5.3   Urine Drug Screen: No results for input(s): "LABOPIA", "COCAINSCRNUR", "LABBENZ", "AMPHETMU", "THCU", "LABBARB" in the last 168 hours.  Alcohol Level No results for input(s): "ETH" in the last 168 hours.  IMAGING past 24 hours VAS [place] TRANSCRANIAL DOPPLER  Result Date: 11/02/2021  Transcranial Doppler Patient Name:  Brent Young  Date of Exam:   11/01/2021 Medical Rec #: [id number]         Accession #:    [id number] Date of Birth: 08-12-1960         Patient Gender: M Patient Age:   61 years Exam Location:  [facility] Procedure:      VAS [place] TRANSCRANIAL DOPPLER Referring Phys: Shon Hale --------------------------------------------------------------------------------   Indications: Stroke. 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 Limitations: Movement of head and hand, chewing motions, thickness of hair, poor              acoustic windows Limitations for diagnostic windows: Unable to insonate right transtemporal window. Unable to insonate left transtemporal window. Comparison Study: No prior study Performing Technologist: Sherren Kerns RVS  Examination Guidelines: A complete evaluation includes B-mode imaging, spectral Doppler, color Doppler, and power Doppler as needed of all accessible portions of each vessel. Bilateral testing is considered an integral part of a complete examination. Limited examinations for reoccurring indications may be performed as noted.  +----------+-------------+----------+-----------+------------------+ RIGHT TCD Right VM (cm)Depth (cm)Pulsatility     Comment       +----------+-------------+----------+-----------+------------------+ MCA           18.00                 1.46                       +----------+-------------+----------+-----------+------------------+  ACA                                         unable to insonate +----------+-------------+----------+-----------+------------------+ Term ICA                                    unable to insonate +----------+-------------+----------+-----------+------------------+ PCA                                         unable to insonate +----------+-------------+----------+-----------+------------------+ Opthalmic     15.00                 1.79                       +----------+-------------+----------+-----------+------------------+ ICA siphon                                  unable to insonate +----------+-------------+----------+-----------+------------------+  Vertebral                                   unable to insonate +----------+-------------+----------+-----------+------------------+  +----------+------------+----------+-----------+------------------+ LEFT TCD  Left VM (cm)Depth (cm)Pulsatility     Comment       +----------+------------+----------+-----------+------------------+ MCA                                        unable to insonate +----------+------------+----------+-----------+------------------+ ACA                                        unable to insonate +----------+------------+----------+-----------+------------------+ Term ICA                                   unable to insonate +----------+------------+----------+-----------+------------------+ PCA                                        unable to insonate +----------+------------+----------+-----------+------------------+ Opthalmic    36.00                 1.78                       +----------+------------+----------+-----------+------------------+ ICA siphon                                 unable to insonate +----------+------------+----------+-----------+------------------+ Vertebral                                  unable to insonate +----------+------------+----------+-----------+------------------+  +------------+------+-------+             VM cm Comment +------------+------+-------+ Prox Basilar-45.00        +------------+------+-------+ Summary:  Highly suboptimal study due to poor windows throughot.antegrade flow in both opthalmics, right middle cerebral and basilar arteries noted. *See table(s) above for TCD measurements and observations.  Diagnosing physician: Delia Heady MD Electronically signed by Delia Heady MD on 11/02/2021 at 11:42:03 AM.    Final     PHYSICAL EXAM  Temp:  [97.7 F (36.5 C)-98.4 F (36.9 C)] 97.7 F (36.5 C) (08/13 0941) Pulse Rate:  [80-88] 80 (08/13 0941) Resp:  [17-20] 20 (08/13 0941) BP:  (132-195)/(70-101) 191/77 (08/13 0941) SpO2:  [93 %-100 %] 95 % (08/13 0941)  General -middle-age African-American male, in no apparent distress.  Cardiovascular - Regular rhythm and rate.  Mental Status -  Level of arousal and disoriented to time, place, and person.   Language including expression, naming, repetition, comprehension was assessed and found intact. Attention span and concentration were impaired. Recent and remote memory were impaired Poor insight into his condition. Cranial Nerves II - XII - II - Visual field impaired left, full on the right. III, IV, VI - Extraocular movements intact.  Jaw jerk is brisk. V - Facial sensation intact bilaterally. VII - Facial movement intact bilaterally. VIII - Hearing & vestibular intact bilaterally. X - Palate elevates symmetrically. XI - Chin turning & shoulder shrug intact bilaterally. XII - Tongue protrusion intact.  Motor Strength - Right hemibody with RUE 2/5, RLE 4/5 LUE 3/5, LLE 0/5 Motor Tone - Muscle tone was assessed at the neck and appendages and was normal.  Sensory - Light touch, temperature/pinprick were assessed and were symmetrical.    Coordination -   Gait and Station - deferred.   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  Patient is poor historian.  Recently treated for UTI with gentamicin.  In the emergency room hemodynamically stable, found to have acute kidney injury.  Also found to have several acute or more subacute to small infarcts involving multiple vascular territories likely lacunar strokes due to small vessel disease.   Seen by neurology and admitted as a stroke.  Skeletal survey negative including CT head and CT spine.  Stroke: Acute multifocal ischemic strokes likely due to small vessel disease in a patient with multiple prior subcortical infarcts and history of hypertensive intracerebral hemorrhage  Code Stroke CT head  Unable to obtain CTA d/t AKI  Bilat carotid US bilateral 1-39% carotid stenosis Transcranial doppler highly suboptimal due to poor windows throughout.  Antegrade flow noted in bilateral ophthalmic and right middle cerebral and basilar arteries.   MRI Brain wo contrast  Several acute and more subacute small infarcts involving different vascular distributions bilaterally. CT cervical spine wo contrast No evidence of acute intracranial or cervical spine injury MR cervical spine PENDING 2D Echo 65-70% EF, Left atrium severely dilated, No thrombus or shunt detected Transcranial doppler is PENDING Bilat carotid US  Right Carotid: Velocities in the right ICA are consistent with a 1-39%  stenosis.  Left Carotid: Velocities in the left ICA are consistent with a 1-39%  stenosis. Vertebrals: Bilateral vertebral arteries demonstrate antegrade flow.  LDL 45 HgbA1c 5.3 VTE prophylaxis - recommended    Diet   Diet Carb Modified Fluid consistency: Thin;  Room service appropriate? Yes   On ASA 325 prior to admission Recommend ASA 81mg  and Plavix x 3 weeks then Plavix as monotherapy for life Therapy recommendations:  Pending, came from facility Disposition:  TBD  Hypertension Stable Permissive hypertension (OK if < 220/120) but gradually normalize in 5-7 days Long-term BP goal normotensive  Hyperlipidemia Home meds:  Lipitor 40mg  resumed in hospital LDL 45, goal < 70 High intensity statin not indicated as patient is at goal Continue statin at discharge  History of Stroke Nov 2022 acute to subacute right BG and CR infarcts secondary to small versus large vessel atherosclerosis in the  setting of multiple uncontrolled risk factors CT Head - Negative CTA H&N - The right V4 is likely occluded proximal to the takeoff of the right PICA, with no definitive flow seen in the right PICA, which is new from prior exam. Re-demonstrated moderate to severe calcified plaque in the carotid siphons, right greater than left, overall unchanged. 65-70% stenosis of the proximal left ICA MRI head - Acute to subacute perforator infarcts at the right basal ganglia and corona radiata. Extensive chronic small vessel ischemia. Cerebral angiogram left ICA 65 to 70% stenosis, bilateral ICA siphon mild to moderate stenosis, right VA severe stenosis 2D Echo - EF 60 to 65% LDL - 98 HgbA1c -9.9 UDS - positive for THC Discharged on ASA 325 and Plavix 75mg  DAPT for 3 months and then aspirin alone due to intracranial stenosis  AMS  DDx includes but is not limited to worsening encephalopathy due to acute kidney injury versus toxic/metabolic etiologies, infection, seizure, and undiagnosed dementia.  Home medications include trazodone and oxcarbazepine, and gentamicin. Creatinine on 8/8 was 0.86 and it is now 3.61. Possible acute increases in serum concentrations of medications with sedating or anticholinergic side effects is a significant component of the DDx given his acutely worsened renal function.  Will send dementia panel PENDING Will obtain EEG PENDING  ?Demyelination lesions Imaging with some concern for possible demyelination CT Cervical imaging for further evaluation is PENDING  Other Stroke Risk Factors Current Cigarette smoker, advised to stop smoking Hx stroke/TIA  Other Active Problems UTI, management per primary team  Hospital day # 2   Patient presented with altered mental status in the setting of urinary tract infection and uncontrolled hypertension.  MRI scan shows small left subcortical ischemic right cerebellar lacunar infarct patient has prior history of lacunar strokes as well as  hypertensive intracerebral hemorrhage with residual deficits and at baseline is wheelchair-bound with poor quality of life.  Recommend aspirin and Plavix for 3 weeks followed by Plavix alone and aggressive risk factor modification.  Check MRI scan cervical spine and he may need sedation with Haldol prior to MRI.  Continue ongoing therapies.  Long discussion with patient and family member at the bedside and answered questions.  Discussed with Dr.Pokhrel.  Greater than 50% time during this 35 minute visit was spent on counseling and coordination of care about his lacunar strokes and discussion about stroke evaluation, prevention and treatment and discussion with care team  , MD Medical Director [facility] Pager: [phone] 11/02/2021 11:47 AM  To contact Stroke Continuity provider, please refer to [facility]. After hours, contact General Neurology

## 2021-11-02 NOTE — Progress Notes (Signed)
Patient attempted twice for MRI scan both were unsuccessful, unable to scan due to moving around in scanner to much. Second attempt patient was given meds which did not help.

## 2021-11-02 NOTE — Progress Notes (Addendum)
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  Patient is a poor historian.  Patient was recently treated for UTI with antibiotic.  In the emergency room, patient was hemodynamically stable, found to have acute kidney injury.  Patient was also found to have several acute or more subacute to small infarcts involving multiple vascular territories.  Seen by neurology and admitted as stroke.  Skeletal survey negative including CT head and CT spine.  Assessment & Plan:  Principal Problem:   Acute CVA (cerebrovascular accident) [facility]) Active Problems:   Essential hypertension   Diabetes mellitus type 2 in obese (HCC)   Elevated troponin   Hyperlipidemia, unspecified   Hypokalemia   Leukocytosis   Impaired functional mobility, balance, gait, and endurance   ASCVD (arteriosclerotic cardiovascular disease)   AKI (acute kidney injury) ([place])   Class 1 obesity   Hypomagnesemia   Acute multiple territory ischemic stroke: Suspect embolism.  Also suspect demyelinating disease as differential.  Patient has residual weakness on the left but had a right-sided weakness as well with some altered mental status.  CT head was negative except for chronic small vessel disease.MRI of the brain showed several acute and subacute small infarct involving multiple vascular distributions.  Suspect demyelination also.  Carotid Doppler without significant stenosis.  Transcranial Doppler pending.  Cerebral angiogram not done because of abnormal kidney functions. Review of 2D echocardiogram showed LV ejection fraction of 65 to 70%, Severe left  concentric ventricular hypertrophy.  Grade 1 diastolic dysfunction.  No evidence of intracardiac thrombus or shunt.  Patient was on aspirin 325 mg daily at home which has been resumed.  LDL  45.  Continue atorvastatin 40 mg daily.  Latest Hemoglobin A1c, 5.3.  Well-controlled.  On metformin.  We will continue.  Patient has been seen by physical therapy who recommended skilled nursing facility placement.  MRI of the C-spine has been ordered to rule out demyelinating disease. Continue Trileptal and gabapentin.  Essential hypertension: Blood pressure stable.  On metoprolol.  ARB on hold.  Hyperlipidemia.  We will continue Lipitor.  Type 2 diabetes mellitus: Well-controlled.  On metformin and Jardiance as outpatient.  Continue to hold oral hypoglycemics, continue sliding scale insulin  Recently treated UTI: Urinalysis is clear.  Off gentamicin.  Leukocytosis.  Improved  Elevated troponin.  Mild elevation.  No ACS.  No chest pain.  Acute kidney injury:  Hold ARB metformin and recently was treated with gentamicin.  Has improved at this time after IV fluids.  Latest creatinine of 1.0.  We will discontinue maintenance fluids.  Hypokalemia.  Potassium 3.4 today.  We will replenish.  Check levels in a.m.  Hypomagnesemia.  Magnesium of 1.2 today.  We will replenish.  Chronic debility and weakness.  PT recommends a skilled nursing facility placement.  Class I obesity.  Would benefit from weight loss as outpatient.  DVT prophylaxis: enoxaparin (LOVENOX) injection 40 mg Start: 11/02/21 1000   Code Status: Full code  Family Communication:  None today.  Disposition Plan: Status is: Inpatient  Remains inpatient appropriate because: Skilled nursing facility placement, need for rehabilitation, pending MRI of the C-spine, neurological input, electrolyte imbalances,  Consultants:  Neurology  Procedures:  None  Antimicrobials:  None   Subjective: Today, patient was seen and examined at  bedside.  States that he could not sleep well.  Denies any dizziness lightheadedness nausea vomiting fever or chills.  Poor historian.  Objective: Vitals:   11/01/21 2004 11/01/21 2048 11/02/21 0000 11/02/21 0402  BP: (!) 176/87 (!) 189/81 132/70 133/78  Pulse: 88  87 83  Resp: 18   18  Temp: 98.4 F (36.9 C)  97.9 F (36.6 C) 98.1 F (36.7 C)  TempSrc: Oral  Oral Oral  SpO2: 94%  93% 93%  Weight:      Height:        Intake/Output Summary (Last 24 hours) at 11/02/2021 0914 Last data filed at 11/02/2021 0636 Gross per 24 hour  Intake 2640.6 ml  Output 1500 ml  Net 1140.6 ml    Filed Weights   10/31/21 1059 10/31/21 1928 11/01/21 0538  Weight: 114.2 kg 90.1 kg 90.7 kg   Body mass index is 27.12 kg/m.   General:  Average built, not in obvious distress, alert awake and communicative HENT:   No scleral pallor or icterus noted. Oral mucosa is moist.  Chest:  Clear breath sounds.  Diminished breath sounds bilaterally. No crackles or wheezes.  CVS: S1 &S2 heard. No murmur.  Regular rate and rhythm. Abdomen: Soft, nontender, nondistended.  Bowel sounds are heard.   Extremities: No cyanosis, clubbing or edema.  Peripheral pulses are palpable. Psych: Alert, awake and oriented to place and person.  Disoriented to month., normal mood CNS: Left-sided weakness more than the right, left leg weakness more pronounced. Skin: Warm and dry.  No rashes noted.   Data Reviewed: I have personally reviewed following labs and imaging studies  CBC: Recent Labs  Lab 10/28/21 2323 10/31/21 0623 11/01/21 0211  WBC 11.8* 13.1* 8.6  NEUTROABS 6.5 7.9*  --   HGB 12.9* 13.0 11.2*  HCT 38.4* 39.9 34.1*  MCV 89.3 92.1 89.7  PLT 323 297 [id number]    Basic Metabolic Panel: Recent Labs  Lab 10/28/21 2323 10/31/21 0623 11/01/21 0211 11/02/21 0200  NA 141 141 142 143  K 3.7 4.3 3.5 3.4*  CL 100 100 104 103  CO2 28 27 30 29   GLUCOSE 81 97 81 95  BUN 18 38* 30* 14  CREATININE 0.86 3.61* 2.11*  1.04  CALCIUM 9.3 8.9 8.6* 9.1  MG  --   --   --  1.2*  PHOS  --   --   --  2.5    GFR: Estimated Creatinine Clearance: 80.8 mL/min (by C-G formula based on SCr of 1.04 mg/dL). Liver Function Tests: Recent Labs  Lab 10/28/21 2323 10/31/21 0623  AST 13* 20  ALT 14 14  ALKPHOS 108 103  BILITOT 0.7 0.7  PROT 8.2* 7.8  ALBUMIN 4.4 4.3    No results for input(s): "LIPASE", "AMYLASE" in the last 168 hours. Recent Labs  Lab 11/01/21 1142  AMMONIA 30   Coagulation Profile: No results for input(s): "INR", "PROTIME" in the last 168 hours. Cardiac Enzymes: No results for input(s): "CKTOTAL", "CKMB", "CKMBINDEX", "TROPONINI" in the last 168 hours. BNP (last 3 results) No results for input(s): "PROBNP" in the last 8760 hours. HbA1C: Recent Labs    10/31/21 0825  HGBA1C 5.3    CBG: Recent Labs  Lab 11/01/21 1221 11/01/21 1644 11/01/21 2044 11/02/21 0613  GLUCAP 76 82 103* 86   Lipid Profile: Recent Labs    11/01/21  0211  CHOL 93  HDL 28*  LDLCALC 45  TRIG 98  CHOLHDL 3.3    Thyroid Function Tests: Recent Labs    11/01/21 1142  TSH 0.195*   Anemia Panel: Recent Labs    11/01/21 1142  VITAMINB12 294  FOLATE 6.8   Sepsis Labs: No results for input(s): "PROCALCITON", "LATICACIDVEN" in the last 168 hours.  Recent Results (from the past 240 hour(s))  Urine Culture     Status: None   Collection Time: 10/28/21 10:19 PM   Specimen: Urine, Clean Catch  Result Value Ref Range Status   Specimen Description   Final    URINE, CLEAN CATCH Performed at [facility], [place] [address], [place], [place] [address]    Special Requests   Final    NONE Performed at [facility], [place] [address], [place], [place] [address]    Culture   Final    NO GROWTH Performed at [facility], [place] [address] [place], [place] [address]    Report Status 10/30/2021 FINAL  Final  MRSA Next Gen by PCR, Nasal     Status: None    Collection Time: 11/01/21  7:41 AM   Specimen: Nasal Mucosa; Nasal Swab  Result Value Ref Range Status   MRSA by PCR Next Gen NOT DETECTED NOT DETECTED Final    Comment: (NOTE) The GeneXpert MRSA Assay (FDA approved for NASAL specimens only), is one component of a comprehensive MRSA colonization surveillance program. It is not intended to diagnose MRSA infection nor to guide or monitor treatment for MRSA infections. Test performance is not FDA approved in patients less than 37 years old. Performed at [facility], [place] [address] [place],  [address]       Radiology Studies: VAS [place] TRANSCRANIAL DOPPLER  Result Date: 11/01/2021  Transcranial Doppler Patient Name:  THIAGO CURE  Date of Exam:   11/01/2021 Medical Rec #: [id number]         Accession #:    [id number] Date of Birth: 1960/11/19         Patient Gender: M Patient Age:   8 years Exam Location:  [facility] Procedure:      VAS [place] TRANSCRANIAL DOPPLER Referring Phys: Charlene Brooke --------------------------------------------------------------------------------  Indications: Stroke. 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 Limitations: Movement of head and hand, chewing motions, thickness of hair, poor              acoustic windows Limitations for diagnostic windows: Unable to insonate right transtemporal window. Unable to insonate left transtemporal window. Comparison Study: No prior study Performing Technologist: Sharion Dove RVS  Examination Guidelines: A complete evaluation includes B-mode imaging, spectral Doppler, color Doppler, and power Doppler as needed of all accessible portions of each vessel. Bilateral testing is considered an integral part of a complete examination. Limited examinations for reoccurring  indications may be performed as noted.  +----------+-------------+----------+-----------+------------------+ RIGHT TCD Right VM (cm)Depth (cm)Pulsatility     Comment       +----------+-------------+----------+-----------+------------------+ MCA           18.00                 1.46                       +----------+-------------+----------+-----------+------------------+  ACA                                         unable to insonate +----------+-------------+----------+-----------+------------------+ Term ICA                                    unable to insonate +----------+-------------+----------+-----------+------------------+ PCA                                         unable to insonate +----------+-------------+----------+-----------+------------------+ Opthalmic     15.00                 1.79                       +----------+-------------+----------+-----------+------------------+ ICA siphon                                  unable to insonate +----------+-------------+----------+-----------+------------------+ Vertebral                                   unable to insonate +----------+-------------+----------+-----------+------------------+  +----------+------------+----------+-----------+------------------+ LEFT TCD  Left VM (cm)Depth (cm)Pulsatility     Comment       +----------+------------+----------+-----------+------------------+ MCA                                        unable to insonate +----------+------------+----------+-----------+------------------+ ACA                                        unable to insonate +----------+------------+----------+-----------+------------------+ Term ICA                                   unable to insonate +----------+------------+----------+-----------+------------------+ PCA                                        unable to insonate  +----------+------------+----------+-----------+------------------+ Opthalmic    36.00                 1.78                       +----------+------------+----------+-----------+------------------+ ICA siphon                                 unable to insonate +----------+------------+----------+-----------+------------------+ Vertebral                                  unable to insonate +----------+------------+----------+-----------+------------------+  +------------+------+-------+             VM cm Comment +------------+------+-------+ Prox Basilar-45.00        +------------+------+-------+  Preliminary    VAS US CAROTID (at [facility] and [facility] only)  Result Date: 11/01/2021 Carotid Arterial Duplex Study Patient Name:  WAYNE WICKLUND  Date of Exam:   10/31/2021 Medical Rec #: [id number]         Accession #:    [id number] Date of Birth: 01-31-1961         Patient Gender: M Patient Age:   29 years Exam Location:  [facility] Procedure:      VAS US CAROTID Referring Phys: Onalee Hua ORTIZ --------------------------------------------------------------------------------  Indications:       CVA. Risk Factors:      Hypertension, hyperlipidemia, Diabetes. Limitations        Today's exam was limited due to patient positioning, patient                    movement. Comparison Study:  No prior studies. Performing Technologist: Chanda Busing RVT  Examination Guidelines: A complete evaluation includes B-mode imaging, spectral Doppler, color Doppler, and power Doppler as needed of all accessible portions of each vessel. Bilateral testing is considered an integral part of a complete examination. Limited examinations for reoccurring indications may be performed as noted.  Right Carotid Findings: +----------+--------+--------+--------+--------------------------+--------+           PSV cm/sEDV cm/sStenosisPlaque Description        Comments  +----------+--------+--------+--------+--------------------------+--------+ CCA Prox  83      9               smooth and heterogenous            +----------+--------+--------+--------+--------------------------+--------+ CCA Distal54      11              smooth and heterogenous            +----------+--------+--------+--------+--------------------------+--------+ ICA Prox  82      19              irregular and heterogenous         +----------+--------+--------+--------+--------------------------+--------+ ICA Distal76      23                                        tortuous +----------+--------+--------+--------+--------------------------+--------+ ECA       109     7                                                  +----------+--------+--------+--------+--------------------------+--------+ +----------+--------+-------+--------+-------------------+           PSV cm/sEDV cmsDescribeArm Pressure (mmHG) +----------+--------+-------+--------+-------------------+ [id number]                                         +----------+--------+-------+--------+-------------------+ +---------+--------+--+--------+-+---------+ VertebralPSV cm/s47EDV cm/s4Antegrade +---------+--------+--+--------+-+---------+  Left Carotid Findings: +----------+--------+--------+--------+--------------------------+--------+           PSV cm/sEDV cm/sStenosisPlaque Description        Comments +----------+--------+--------+--------+--------------------------+--------+ CCA Prox  94      16              smooth and heterogenous            +----------+--------+--------+--------+--------------------------+--------+ CCA Distal47      9  smooth and heterogenous            +----------+--------+--------+--------+--------------------------+--------+ ICA Prox  79      24              irregular and heterogenous          +----------+--------+--------+--------+--------------------------+--------+ ICA Distal113     38                                        tortuous +----------+--------+--------+--------+--------------------------+--------+ ECA       96      0                                                  +----------+--------+--------+--------+--------------------------+--------+ +----------+--------+--------+--------+-------------------+           PSV cm/sEDV cm/sDescribeArm Pressure (mmHG) +----------+--------+--------+--------+-------------------+ Subclavian100                                         +----------+--------+--------+--------+-------------------+ +---------+--------+--+--------+--+---------+ VertebralPSV cm/s63EDV cm/s26Antegrade +---------+--------+--+--------+--+---------+   Summary: Right Carotid: Velocities in the right ICA are consistent with a 1-39% stenosis. Left Carotid: Velocities in the left ICA are consistent with a 1-39% stenosis. Vertebrals: Bilateral vertebral arteries demonstrate antegrade flow. *See table(s) above for measurements and observations.  Electronically signed by Orlie Pollen on 11/01/2021 at 12:27:36 PM.    Final    ECHOCARDIOGRAM COMPLETE  Result Date: 10/31/2021    ECHOCARDIOGRAM REPORT   Patient Name:   JABARRI INTERRANTE Date of Exam: 10/31/2021 Medical Rec #:  [id number]        Height:       73.0 in Accession #:    [id number]       Weight:       251.8 lb Date of Birth:  04/29/1960        BSA:          2.373 m Patient Age:    37 years         BP:           144/79 mmHg Patient Gender: M                HR:           90 bpm. Exam Location:  Inpatient Procedure: 2D Echo, Cardiac Doppler and Color Doppler Indications:    Stroke  History:        Patient has prior history of Echocardiogram examinations, most                 recent 02/17/2021. Risk Factors:Diabetes and Hypertension.  Sonographer:    Jefferey Pica Referring Phys: [id number] [place]  1. Left ventricular ejection fraction, by estimation, is 65 to 70%. The left ventricle has normal function. The left ventricle has no regional wall motion abnormalities. There is severe concentric left ventricular hypertrophy. Left ventricular diastolic  parameters are consistent with Grade I diastolic dysfunction (impaired relaxation).  2. Right ventricular systolic function is normal. The right ventricular size is normal. There is mildly elevated pulmonary artery systolic pressure. The estimated right ventricular systolic pressure is [id number] mmHg.  3. Left atrial size was severely dilated.  4. Right atrial size  was mildly dilated.  5. The mitral valve is degenerative. Trivial mitral valve regurgitation. No evidence of mitral stenosis. Moderate mitral annular calcification.  6. The aortic valve has an indeterminant number of cusps. There is moderate calcification of the aortic valve. There is moderate thickening of the aortic valve. Aortic valve regurgitation is moderate by PHT but visually appears only mild. Aortic valve sclerosis/calcification is present, without any evidence of aortic stenosis. Aortic regurgitation PHT measures 298 msec. Aortic valve Vmax measures 2.48 m/s.  7. The inferior vena cava is normal in size with greater than 50% respiratory variability, suggesting right atrial pressure of 3 mmHg. Conclusion(s)/Recommendation(s): No intracardiac source of embolism detected on this transthoracic study. Consider a transesophageal echocardiogram to exclude cardiac source of embolism if clinically indicated. FINDINGS  Left Ventricle: Left ventricular ejection fraction, by estimation, is 65 to 70%. The left ventricle has normal function. The left ventricle has no regional wall motion abnormalities. The left ventricular internal cavity size was normal in size. There is  severe concentric left ventricular hypertrophy. Left ventricular diastolic parameters are consistent with Grade I diastolic  dysfunction (impaired relaxation). Indeterminate filling pressures. Right Ventricle: The right ventricular size is normal. No increase in right ventricular wall thickness. Right ventricular systolic function is normal. There is mildly elevated pulmonary artery systolic pressure. The tricuspid regurgitant velocity is 3.09  m/s, and with an assumed right atrial pressure of 3 mmHg, the estimated right ventricular systolic pressure is 41.2 mmHg. Left Atrium: Left atrial size was severely dilated. Right Atrium: Right atrial size was mildly dilated. Pericardium: Trivial pericardial effusion is present. The pericardial effusion is posterior to the left ventricle. Mitral Valve: The mitral valve is degenerative in appearance. There is mild thickening of the mitral valve leaflet(s). There is mild calcification of the mitral valve leaflet(s). Moderate mitral annular calcification. Trivial mitral valve regurgitation. No evidence of mitral valve stenosis. Tricuspid Valve: The tricuspid valve is normal in structure. Tricuspid valve regurgitation is trivial. No evidence of tricuspid stenosis. Aortic Valve: The aortic valve has an indeterminant number of cusps. There is moderate calcification of the aortic valve. There is moderate thickening of the aortic valve. Aortic valve regurgitation is mild to moderate. Aortic regurgitation PHT measures 298 msec. Aortic valve sclerosis/calcification is present, without any evidence of aortic stenosis. Aortic valve peak gradient measures 24.6 mmHg. Pulmonic Valve: The pulmonic valve was normal in structure. Pulmonic valve regurgitation is not visualized. No evidence of pulmonic stenosis. Aorta: The aortic root is normal in size and structure. Venous: The inferior vena cava is normal in size with greater than 50% respiratory variability, suggesting right atrial pressure of 3 mmHg. IAS/Shunts: No atrial level shunt detected by color flow Doppler.  LEFT VENTRICLE PLAX 2D LVIDd:         3.70 cm    Diastology LVIDs:         1.90 cm   LV e' lateral:   5.98 cm/s LV PW:         1.70 cm   LV E/e' lateral: 10.8 LV IVS:        1.50 cm LVOT diam:     2.00 cm LV SV:         75 LV SV Index:   32 LVOT Area:     3.14 cm  RIGHT VENTRICLE             IVC RV Basal diam:  3.25 cm     IVC diam: 1.70 cm RV S prime:  14.70 cm/s TAPSE (M-mode): 2.4 cm LEFT ATRIUM              Index        RIGHT ATRIUM           Index LA diam:        3.60 cm  1.52 cm/m   RA Area:     25.30 cm LA Vol (A2C):   127.0 ml 53.52 ml/m  RA Volume:   81.80 ml  34.47 ml/m LA Vol (A4C):   89.8 ml  37.85 ml/m LA Biplane Vol: 111.0 ml 46.78 ml/m  AORTIC VALVE                 PULMONIC VALVE AV Area (Vmax): 1.75 cm     PV Vmax:       0.93 m/s AV Vmax:        248.00 cm/s  PV Peak grad:  3.4 mmHg AV Peak Grad:   24.6 mmHg LVOT Vmax:      138.00 cm/s LVOT Vmean:     85.500 cm/s LVOT VTI:       0.239 m AI PHT:         298 msec  AORTA Ao Root diam: 3.30 cm Ao Asc diam:  3.10 cm MITRAL VALVE                TRICUSPID VALVE MV Area (PHT): 5.70 cm     TR Peak grad:   38.2 mmHg MV Decel Time: 133 msec     TR Vmax:        309.00 cm/s MV E velocity: 64.60 cm/s MV A velocity: 131.00 cm/s  SHUNTS MV E/A ratio:  0.49         Systemic VTI:  0.24 m                             Systemic Diam: 2.00 cm Fransico Him MD Electronically signed by Fransico Him MD Signature Date/Time: 10/31/2021/12:17:43 PM    Final      Scheduled Meds:  amLODipine  10 mg Oral Daily   aspirin EC  81 mg Oral Daily   atorvastatin  40 mg Oral Daily   clopidogrel  75 mg Oral Daily   enoxaparin (LOVENOX) injection  40 mg Subcutaneous Daily   gabapentin  300 mg Oral TID   insulin aspart  0-5 Units Subcutaneous QHS   insulin aspart  0-9 Units Subcutaneous TID WC   metoprolol succinate  50 mg Oral Daily   mometasone-formoterol  2 puff Inhalation BID   Oxcarbazepine  300 mg Oral TID   pantoprazole  40 mg Oral Daily   potassium chloride  40 mEq Oral Once   traZODone  50 mg Oral QHS    Continuous Infusions:  lactated ringers 125 mL/hr at 11/02/21 0614     LOS: 2 days   Flora Lipps, MD [facility]

## 2021-11-03 DIAGNOSIS — I639 Cerebral infarction, unspecified: Secondary | ICD-10-CM | POA: Diagnosis not present

## 2021-11-03 DIAGNOSIS — I2510 Atherosclerotic heart disease of native coronary artery without angina pectoris: Secondary | ICD-10-CM | POA: Diagnosis not present

## 2021-11-03 DIAGNOSIS — E1169 Type 2 diabetes mellitus with other specified complication: Secondary | ICD-10-CM | POA: Diagnosis not present

## 2021-11-03 DIAGNOSIS — N179 Acute kidney failure, unspecified: Secondary | ICD-10-CM | POA: Diagnosis not present

## 2021-11-03 DIAGNOSIS — E669 Obesity, unspecified: Secondary | ICD-10-CM | POA: Diagnosis not present

## 2021-11-03 LAB — GLUCOSE, CAPILLARY
Glucose-Capillary: 114 mg/dL — ABNORMAL HIGH (ref 70–99)
Glucose-Capillary: 130 mg/dL — ABNORMAL HIGH (ref 70–99)
Glucose-Capillary: 131 mg/dL — ABNORMAL HIGH (ref 70–99)
Glucose-Capillary: 134 mg/dL — ABNORMAL HIGH (ref 70–99)

## 2021-11-03 LAB — CBC
HCT: 33.3 % — ABNORMAL LOW (ref 39.0–52.0)
Hemoglobin: 11.1 g/dL — ABNORMAL LOW (ref 13.0–17.0)
MCH: 29.7 pg (ref 26.0–34.0)
MCHC: 33.3 g/dL (ref 30.0–36.0)
MCV: 89 fL (ref 80.0–100.0)
Platelets: 290 10*3/uL (ref 150–400)
RBC: 3.74 MIL/uL — ABNORMAL LOW (ref 4.22–5.81)
RDW: 14.3 % (ref 11.5–15.5)
WBC: 7.8 10*3/uL (ref 4.0–10.5)
nRBC: 0 % (ref 0.0–0.2)

## 2021-11-03 LAB — BASIC METABOLIC PANEL
Anion gap: 9 (ref 5–15)
BUN: 9 mg/dL (ref 8–23)
CO2: 28 mmol/L (ref 22–32)
Calcium: 9 mg/dL (ref 8.9–10.3)
Chloride: 105 mmol/L (ref 98–111)
Creatinine, Ser: 1 mg/dL (ref 0.61–1.24)
GFR, Estimated: >60 mL/min (ref >60)
Glucose, Bld: 123 mg/dL — ABNORMAL HIGH (ref 70–99)
Potassium: 3.9 mmol/L (ref 3.5–5.1)
Sodium: 142 mmol/L (ref 135–145)

## 2021-11-03 LAB — MAGNESIUM: Magnesium: 1.3 mg/dL — ABNORMAL LOW (ref 1.7–2.4)

## 2021-11-03 MED ORDER — POTASSIUM CHLORIDE CRYS ER 20 MEQ PO TBCR
40.0000 meq | EXTENDED_RELEASE_TABLET | Freq: Two times a day (BID) | ORAL | Status: AC
  Administered 2021-11-03 (×2): 40 meq via ORAL
  Filled 2021-11-03 (×2): qty 2

## 2021-11-03 MED ORDER — POLYETHYLENE GLYCOL 3350 17 G PO PACK
17.0000 g | PACK | Freq: Every day | ORAL | Status: DC
  Administered 2021-11-03 – 2021-11-06 (×4): 17 g via ORAL
  Filled 2021-11-03 (×4): qty 1

## 2021-11-03 MED ORDER — MAGNESIUM SULFATE 2 GM/50ML IV SOLN
2.0000 g | Freq: Once | INTRAVENOUS | Status: AC
  Administered 2021-11-03: 2 g via INTRAVENOUS
  Filled 2021-11-03: qty 50

## 2021-11-03 MED ORDER — DOCUSATE SODIUM 100 MG PO CAPS
100.0000 mg | ORAL_CAPSULE | Freq: Two times a day (BID) | ORAL | Status: DC
  Administered 2021-11-03 – 2021-11-06 (×8): 100 mg via ORAL
  Filled 2021-11-03 (×8): qty 1

## 2021-11-03 NOTE — Progress Notes (Addendum)
STROKE TEAM PROGRESS NOTE   INTERVAL HISTORY No family is at the bedside.  Patient is alert and interactive on examination today.  He has no complaints.  Vital signs stable.  Neurological exam unchanged. Wheelchair bound at baseline.     Unable to tolerate MRI C-spine despite 2 attempts and sedation, will discontinue order.     Vitals:   11/02/21 2015 11/03/21 0000 11/03/21 0435 11/03/21 0807  BP: (!) 171/80 (!) 169/75 (!) 151/91 (!) 140/86  Pulse: 85  95 90  Resp: 18 20 18 18   Temp: 97.6 F (36.4 C) 98.1 F (36.7 C) 98.1 F (36.7 C) 98.6 F (37 C)  TempSrc: Oral Oral Oral Oral  SpO2: 90% 95% 90% 90%  Weight:      Height:       CBC:  Recent Labs  Lab 10/28/21 2323 10/31/21 0623 11/01/21 0211 11/03/21 0655  WBC 11.8* 13.1* 8.6 7.8  NEUTROABS 6.5 7.9*  --   --   HGB 12.9* 13.0 11.2* 11.1*  HCT 38.4* 39.9 34.1* 33.3*  MCV 89.3 92.1 89.7 89.0  PLT 323 297 279 290   Basic Metabolic Panel:  Recent Labs  Lab 11/02/21 0200 11/03/21 0655  NA 143 142  K 3.4* 3.9  CL 103 105  CO2 29 28  GLUCOSE 95 123*  BUN 14 9  CREATININE 1.04 1.00  CALCIUM 9.1 9.0  MG 1.2* 1.3*  PHOS 2.5  --    Lipid Panel:  Recent Labs  Lab 11/01/21 0211  CHOL 93  TRIG 98  HDL 28*  CHOLHDL 3.3  VLDL 20  LDLCALC 45   HgbA1c:  Recent Labs  Lab 10/31/21 0825  HGBA1C 5.3   Urine Drug Screen: No results for input(s): "LABOPIA", "COCAINSCRNUR", "LABBENZ", "AMPHETMU", "THCU", "LABBARB" in the last 168 hours.  Alcohol Level No results for input(s): "ETH" in the last 168 hours.  IMAGING past 24 hours No results found.  PHYSICAL EXAM  Temp:  [97.6 F (36.4 C)-98.6 F (37 C)] 98.6 F (37 C) (08/14 0807) Pulse Rate:  [80-95] 90 (08/14 0807) Resp:  [18-20] 18 (08/14 0807) BP: (140-188)/(71-91) 140/86 (08/14 0807) SpO2:  [90 %-97 %] 90 % (08/14 0807)  General -middle-age African-American male, in no apparent distress.  Cardiovascular - Regular rhythm and rate.  Mental Status -   Slightly drowsy, wakes to voice. Disoriented to time, place, and person.   Language including expression, naming, repetition, comprehension was assessed and found intact. Attention span and concentration were impaired. Recent and remote memory were impaired Poor insight into his condition. Cranial Nerves II - XII - II - Visual field impaired left, full on the right. III, IV, VI - Extraocular movements intact.  Jaw jerk is brisk. V - Facial sensation intact bilaterally. VII - Facial movement intact bilaterally. VIII - Hearing & vestibular intact bilaterally. X - Palate elevates symmetrically. XI - Chin turning & shoulder shrug intact bilaterally. XII - Tongue protrusion intact.  Motor Strength - Right hemibody with RUE 2/5, RLE 4/5, LUE 3/5, LLE 0/5 Motor Tone - Muscle tone was assessed at the neck and appendages and was normal.  Sensory - Light touch, temperature/pinprick were assessed and were symmetrical.    Gait and Station - deferred, patient is wheelchair bound at baseline.   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  Patient is poor historian.  Recently treated for UTI with gentamicin.  In the emergency room hemodynamically stable, found to have acute kidney injury.  Also found to have several acute or more subacute to small infarcts involving multiple vascular territories likely lacunar strokes due to small vessel disease.  Seen by neurology and admitted as a stroke.  Skeletal survey negative including CT head and CT spine.  Stroke: Acute multifocal ischemic strokes likely due to small vessel  disease in a patient with multiple prior subcortical infarcts and history of hypertensive intracerebral hemorrhage Code Stroke CT head  Unable to obtain CTA d/t AKI  Bilat carotid US bilateral 1-39% carotid stenosis Transcranial doppler highly suboptimal due to poor windows throughout.  Antegrade flow noted in bilateral ophthalmic and right middle cerebral and basilar arteries.   MRI Brain wo contrast: Several acute and more subacute small infarcts involving different vascular distributions bilaterally. CT cervical spine wo contrast: No evidence of acute intracranial or cervical spine injury MR cervical spine PENDING 2D Echo 65-70% EF, Left atrium severely dilated, No thrombus or shunt detected Transcranial doppler is PENDING Bilat carotid US  Right Carotid: Velocities in the right ICA are consistent with a 1-39% stenosis. Left Carotid: Velocities in the left ICA are consistent with a 1-39% stenosis. Vertebrals: Bilateral vertebral arteries demonstrate antegrade flow.  LDL 45 HgbA1c 5.3 VTE prophylaxis - recommended    Diet   Diet Carb Modified Fluid consistency: Thin; Room service appropriate? Yes   On ASA 325 prior to admission Recommend ASA 81mg  and Plavix x 3 weeks then Plavix as monotherapy for life Therapy recommendations:  Pending, came from facility Disposition:  TBD  Hypertension Stable. Home meds: losartan-hctz, hydralazine, amlodipine Permissive hypertension (OK if < 220/120) but gradually normalize in 5-7 days Long-term BP goal normotensive  Hyperlipidemia Home meds:  Lipitor 40mg  resumed in hospital LDL 45, goal < 70 High intensity statin not indicated as patient is at goal Continue statin at discharge  History of Stroke Nov 2022 acute to subacute right BG and CR infarcts secondary to small versus large vessel atherosclerosis in the setting of multiple uncontrolled risk factors CT Head - Negative CTA H&N - The right V4 is likely occluded proximal to the takeoff  of the right PICA, with no definitive flow seen in the right PICA, which is new from prior exam. Re-demonstrated moderate to severe calcified plaque in the carotid siphons, right greater than left, overall unchanged. 65-70% stenosis of the proximal left ICA MRI head - Acute to subacute perforator infarcts at the right basal ganglia and corona radiata. Extensive chronic small vessel ischemia. Cerebral angiogram left ICA 65 to 70% stenosis, bilateral ICA siphon mild to moderate stenosis, right VA severe stenosis 2D Echo - EF 60 to 65% LDL - 98 HgbA1c -9.9 UDS - positive for THC Discharged on ASA 325 and Plavix 75mg  DAPT for 3 months and then aspirin alone due to intracranial stenosis  Acute encephalopathy  DDx includes but is not limited to worsening encephalopathy due to acute kidney injury versus toxic/metabolic etiologies, infection, seizure, and undiagnosed dementia.  Home medications include trazodone and oxcarbazepine, and gentamicin. Creatinine on 8/8 was 0.86 and it is now 3.61. Possible acute increases in serum concentrations of medications with sedating or anticholinergic side effects is a significant component of the DDx given his  acutely worsened renal function.  EEG with evidence of mild generalized nonspecific cerebral dysfunction without seizures or seizure predisposition.  RPR nonreactive, TSH 0.195, vitamin B12 294, folate 6.8, ammonia 30  Will send dementia panel PENDING  ?Demyelination lesions Imaging with some concern for possible demyelination MRI Cervical imaging for further evaluation attempted twice with sedation and patient is unable to tolerate imaging. Will not pursue further attempts at this time as patient is quite disabled and bedbound at baseline and new diagnosis of multiple sclerosis will probably not result in changing treatment given his debilitated condition.  Other Stroke Risk Factors Current Cigarette smoker, advised to stop smoking Hx stroke/TIA  Other  Active Problems UTI, management per primary team  Hospital day # 3  -- Lanae Boast, AGACNP-BC [facility] [phone]  I have personally obtained history,examined this patient, reviewed notes, independently viewed imaging studies, participated in medical decision making and plan of care.ROS completed by me personally and pertinent positives fully documented  I have made any additions or clarifications directly to the above note. Agree with note above.  Aspirin and Plavix for 3 weeks followed by Plavix alone for life and aggressive risk factor modification.  We will not pursue further work-up for multiple sclerosis given 2 failed attempts despite sedation to get MRI of the C-spine and patient being so disabled at baseline he would not be candidate for aggressive treatment of MS given his pre-existing severe disability.  Stroke team will sign off.  Kindly call for questions discussed with Dr.Pokhrel.  Greater than 50% time during the study annual visit was spent on counseling and coordination of care and discussion patient and care team and answering questions. Delia Heady, MD Medical Director [facility] Pager: [phone] 11/03/2021 1:33 PM  To contact Stroke Continuity provider, please refer to [email or web address]. After hours, contact General Neurology

## 2021-11-03 NOTE — Progress Notes (Signed)
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  Patient is a poor historian.  Patient was recently treated for UTI with antibiotic.  In the ED,, patient was hemodynamically stable, found to have acute kidney injury.  Patient was also found to have several acute or more subacute to small infarcts involving multiple vascular territories.  Seen by neurology and admitted as stroke.  Skeletal survey negative including CT head and CT spine.  Assessment & Plan:  Principal Problem:   Acute CVA (cerebrovascular accident) [facility]) Active Problems:   Essential hypertension   Diabetes mellitus type 2 in obese (HCC)   Elevated troponin   Hyperlipidemia, unspecified   Hypokalemia   Leukocytosis   Impaired functional mobility, balance, gait, and endurance   ASCVD (arteriosclerotic cardiovascular disease)   AKI (acute kidney injury) (HCC)   Class 1 obesity   Hypomagnesemia   Acute multiple territory ischemic stroke: Suspect embolism.  Also suspect demyelinating disease as differential.  Patient has residual weakness on the left but had a right-sided weakness as well with some altered mental status.  Mental status has improved.  CT head was negative except for chronic small vessel disease. MRI of the brain showed several acute and subacute small infarct involving multiple vascular distributions.   Carotid Doppler without significant stenosis.  Transcranial Doppler showed a highly suboptimal study.  Review of 2D echocardiogram showed LV ejection fraction of 65 to 70%, Severe left concentric ventricular hypertrophy.  Grade 1 diastolic dysfunction.  No  evidence of intracardiac thrombus or shunt.  Patient was on aspirin 325 mg daily at home which has been resumed.  LDL  45.  Continue atorvastatin 40 mg daily.  Latest Hemoglobin A1c, 5.3.  Patient has been seen by physical therapy who recommended skilled nursing facility placement.  MRI of the C-spine has been ordered to rule out demyelinating disease which is pending the patient is unable to do MRI so neurology is planning to cancel this test.  Continue Trileptal and gabapentin.  Essential hypertension: Blood pressure stable.  On metoprolol.  ARB on hold.  Hyperlipidemia.  We will continue Lipitor.  Type 2 diabetes mellitus: Well-controlled.  On metformin and Jardiance as outpatient.  Continue to hold oral hypoglycemics, continue sliding scale insulin.  Latest POC glucose of 114.  Recently treated UTI: Urinalysis is clear.  Off gentamicin.  Leukocytosis.  Improved  Elevated troponin.  Mild elevation.  No ACS.  No chest pain.  Acute kidney injury:  Resolved.  Currently ARB metformin on hold.  And recently was treated with gentamicin.  Has improved at this time after IV fluids.  Latest creatinine of 1.0.  Off IV fluids.  Hypokalemia.  Potassium 3.4 on 11/02/2021.  Has been replenished and improved to 3.9.  We will continue to replace.  Hypomagnesemia.  Magnesium of 1.2 on 11/02/2021.  Has been replenished.  Is above 1.3 today.  We will continue to replenish.  Chronic debility and weakness.  PT recommends  skilled nursing facility placement.  DVT prophylaxis: enoxaparin (LOVENOX) injection 40 mg Start: 11/02/21 1000   Code Status: Full code  Family Communication:  I spoke with the patient/sister on the phone at [phone] and updated  her about the clinical condition of the patient.  She expressed that she would like to see if her brother could be discharged to a different skilled nursing facility.  Would communicate with the social worker about it.  Disposition Plan: Status is:  Inpatient  Remains inpatient appropriate because: Skilled nursing facility placement, need for rehabilitation, pending MRI of the C-spine, neurological input, electrolyte imbalances  Consultants:  Neurology  Procedures:  None  Antimicrobials:  None   Subjective:  Today, patient was seen and examined at bedside.  Patient denies any headache, nausea vomiting shortness of breath abdominal pain.  Wishes me to talk to the sister.  Spoke with the patient's sister who states that he has been having brain fogging more often than in the past.   Objective: Vitals:   11/02/21 1702 11/02/21 2015 11/03/21 0000 11/03/21 0435  BP: (!) 150/71 (!) 171/80 (!) 169/75 (!) 151/91  Pulse: 84 85  95  Resp: 19 18 20 18   Temp: 98.1 F (36.7 C) 97.6 F (36.4 C) 98.1 F (36.7 C) 98.1 F (36.7 C)  TempSrc: Oral Oral Oral Oral  SpO2: 96% 90% 95% 90%  Weight:      Height:        Intake/Output Summary (Last 24 hours) at 11/03/2021 0731 Last data filed at 11/03/2021 0604 Gross per 24 hour  Intake 1047.85 ml  Output 1100 ml  Net -52.15 ml    Filed Weights   10/31/21 1059 10/31/21 1928 11/01/21 0538  Weight: 114.2 kg 90.1 kg 90.7 kg   Body mass index is 27.12 kg/m.   General:  Average built, not in obvious distress HENT:   No scleral pallor or icterus noted. Oral mucosa is moist.  Chest: .  Diminished breath sounds bilaterally. No crackles or wheezes.  CVS: S1 &S2 heard. No murmur.  Regular rate and rhythm. Abdomen: Soft, nontender, nondistended.  Bowel sounds are heard.   Extremities: No cyanosis, clubbing or edema.  Peripheral pulses are palpable. Psych: Alert, awake and communicative. CNS: Left-sided weakness more than the right, left-sided lower extremity weakness Skin: Warm and dry.  No rashes noted.   Data Reviewed: I have personally reviewed following labs and imaging studies  CBC: Recent Labs  Lab 10/28/21 2323 10/31/21 0623 11/01/21 0211  WBC 11.8* 13.1* 8.6  NEUTROABS 6.5  7.9*  --   HGB 12.9* 13.0 11.2*  HCT 38.4* 39.9 34.1*  MCV 89.3 92.1 89.7  PLT 323 297 [id number]    Basic Metabolic Panel: Recent Labs  Lab 10/28/21 2323 10/31/21 0623 11/01/21 0211 11/02/21 0200  NA 141 141 142 143  K 3.7 4.3 3.5 3.4*  CL 100 100 104 103  CO2 28 27 30 29   GLUCOSE 81 97 81 95  BUN 18 38* 30* 14  CREATININE 0.86 3.61* 2.11* 1.04  CALCIUM 9.3 8.9 8.6* 9.1  MG  --   --   --  1.2*  PHOS  --   --   --  2.5    GFR: Estimated Creatinine Clearance: 80.8 mL/min (by C-G formula based on SCr of 1.04 mg/dL). Liver Function Tests: Recent Labs  Lab 10/28/21 2323 10/31/21 0623  AST 13* 20  ALT 14 14  ALKPHOS 108 103  BILITOT 0.7 0.7  PROT 8.2* 7.8  ALBUMIN 4.4 4.3    No results for input(s): "LIPASE", "AMYLASE" in the last 168 hours. Recent Labs  Lab 11/01/21 1142  AMMONIA 30    Coagulation Profile: No results for input(s): "INR", "PROTIME" in  the last 168 hours. Cardiac Enzymes: No results for input(s): "CKTOTAL", "CKMB", "CKMBINDEX", "TROPONINI" in the last 168 hours. BNP (last 3 results) No results for input(s): "PROBNP" in the last 8760 hours. HbA1C: Recent Labs    10/31/21 0825  HGBA1C 5.3    CBG: Recent Labs  Lab 11/02/21 0613 11/02/21 1229 11/02/21 1656 11/02/21 2120 11/03/21 0632  GLUCAP 86 126* 137* 158* 114*    Lipid Profile: Recent Labs    11/01/21 0211  CHOL 93  HDL 28*  LDLCALC 45  TRIG 98  CHOLHDL 3.3    Thyroid Function Tests: Recent Labs    11/01/21 1142  TSH 0.195*    Anemia Panel: Recent Labs    11/01/21 1142  VITAMINB12 294  FOLATE 6.8    Sepsis Labs: No results for input(s): "PROCALCITON", "LATICACIDVEN" in the last 168 hours.  Recent Results (from the past 240 hour(s))  Urine Culture     Status: None   Collection Time: 10/28/21 10:19 PM   Specimen: Urine, Clean Catch  Result Value Ref Range Status   Specimen Description   Final    URINE, CLEAN CATCH Performed at [facility],  [place] [address] [place], [place] [address]    Special Requests   Final    NONE Performed at [facility], [place] [address], [place], [place] [address]    Culture   Final    NO GROWTH Performed at [facility], [place] [address] [place], [place] [address]    Report Status 10/30/2021 FINAL  Final  MRSA Next Gen by PCR, Nasal     Status: None   Collection Time: 11/01/21  7:41 AM   Specimen: Nasal Mucosa; Nasal Swab  Result Value Ref Range Status   MRSA by PCR Next Gen NOT DETECTED NOT DETECTED Final    Comment: (NOTE) The GeneXpert MRSA Assay (FDA approved for NASAL specimens only), is one component of a comprehensive MRSA colonization surveillance program. It is not intended to diagnose MRSA infection nor to guide or monitor treatment for MRSA infections. Test performance is not FDA approved in patients less than 35 years old. Performed at [facility], [place] [address] [place], [place] [address]       Radiology Studies: VAS [place] TRANSCRANIAL DOPPLER  Result Date: 11/02/2021  Transcranial Doppler Patient Name:  JEVON WEDDELL  Date of Exam:   11/01/2021 Medical Rec #: [id number]         Accession #:    [id number] Date of Birth: 01/17/1961         Patient Gender: M Patient Age:   14 years Exam Location:  [facility] Procedure:      VAS [place] TRANSCRANIAL DOPPLER Referring Phys: Charlene Brooke --------------------------------------------------------------------------------  Indications: Stroke. 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 Limitations: Movement of head and hand, chewing motions, thickness of hair, poor              acoustic windows Limitations for diagnostic windows: Unable to insonate right transtemporal window. Unable to insonate left  transtemporal window. Comparison Study: No prior study Performing Technologist: Sharion Dove RVS  Examination Guidelines: A complete evaluation includes B-mode imaging, spectral Doppler, color Doppler, and power Doppler as needed of all accessible portions of each vessel. Bilateral testing is considered an integral part of  a complete examination. Limited examinations for reoccurring indications may be performed as noted.  +----------+-------------+----------+-----------+------------------+ RIGHT TCD Right VM (cm)Depth (cm)Pulsatility     Comment       +----------+-------------+----------+-----------+------------------+ MCA           18.00                 1.46                       +----------+-------------+----------+-----------+------------------+ ACA                                         unable to insonate +----------+-------------+----------+-----------+------------------+ Term ICA                                    unable to insonate +----------+-------------+----------+-----------+------------------+ PCA                                         unable to insonate +----------+-------------+----------+-----------+------------------+ Opthalmic     15.00                 1.79                       +----------+-------------+----------+-----------+------------------+ ICA siphon                                  unable to insonate +----------+-------------+----------+-----------+------------------+ Vertebral                                   unable to insonate +----------+-------------+----------+-----------+------------------+  +----------+------------+----------+-----------+------------------+ LEFT TCD  Left VM (cm)Depth (cm)Pulsatility     Comment       +----------+------------+----------+-----------+------------------+ MCA                                        unable to insonate +----------+------------+----------+-----------+------------------+ ACA                                         unable to insonate +----------+------------+----------+-----------+------------------+ Term ICA                                   unable to insonate +----------+------------+----------+-----------+------------------+ PCA                                        unable to insonate +----------+------------+----------+-----------+------------------+ Opthalmic    36.00                 1.78                       +----------+------------+----------+-----------+------------------+ ICA siphon  unable to insonate +----------+------------+----------+-----------+------------------+ Vertebral                                  unable to insonate +----------+------------+----------+-----------+------------------+  +------------+------+-------+             VM cm Comment +------------+------+-------+ Prox Basilar-45.00        +------------+------+-------+ Summary:  Highly suboptimal study due to poor windows throughot.antegrade flow in both opthalmics, right middle cerebral and basilar arteries noted. *See table(s) above for TCD measurements and observations.  Diagnosing physician: Delia Heady MD Electronically signed by Delia Heady MD on 11/02/2021 at 11:42:03 AM.    Final      Scheduled Meds:  amLODipine  10 mg Oral Daily   aspirin EC  81 mg Oral Daily   atorvastatin  40 mg Oral Daily   clopidogrel  75 mg Oral Daily   enoxaparin (LOVENOX) injection  40 mg Subcutaneous Daily   gabapentin  300 mg Oral TID   insulin aspart  0-5 Units Subcutaneous QHS   insulin aspart  0-9 Units Subcutaneous TID WC   magnesium oxide  400 mg Oral BID   metoprolol succinate  50 mg Oral Daily   mometasone-formoterol  2 puff Inhalation BID   Oxcarbazepine  300 mg Oral TID   pantoprazole  40 mg Oral Daily   traZODone  50 mg Oral QHS   Continuous Infusions:  lactated ringers 125 mL/hr at 11/02/21 1747     LOS: 3 days   Joycelyn Das, MD [facility]

## 2021-11-03 NOTE — TOC Progression Note (Signed)
Transition of Care [facility]) - Progression Note    Patient Details  Name: Brent Young MRN: [id number] Date of Birth: [birth year]  Transition of Care [facility]) CM/SW Contact  Baldemar Lenis, [place] Phone Number: 11/03/2021, 2:34 PM  Clinical Narrative:   CSW spoke with patient's son, Brent Young, and sister, Brent Young, to discuss SNF options. Patient only has bed offers at [facility] and [facility], and family does not want [facility], either. Family really not interested in patient returning to [facility]. CSW said any other SNF options would be outside of town. Brent Young in agreement, asking if CSW can look towards the [place] area, specifically interested in [facility] had declined). CSW faxed out referral further, awaiting responses. CSW did notify Brent Young that if no other SNF was located, patient wouldn't have any other choice but to return to [facility] for now to continue to work on a transfer elsewhere. CSW to follow.    Expected Discharge Plan: Skilled Nursing Facility Barriers to Discharge: Continued Medical Work up, SNF Pending bed offer  Expected Discharge Plan and Services Expected Discharge Plan: Skilled Nursing Facility In-house Referral: Clinical Social Work   Post Acute Care Choice: Skilled Nursing Facility Living arrangements for the past 2 months: Skilled Nursing Facility [facility])                                       Social Determinants of Health (SDOH) Interventions    Readmission Risk Interventions     No data to display

## 2021-11-04 ENCOUNTER — Other Ambulatory Visit: Payer: Self-pay

## 2021-11-04 DIAGNOSIS — I639 Cerebral infarction, unspecified: Secondary | ICD-10-CM | POA: Diagnosis not present

## 2021-11-04 DIAGNOSIS — D72829 Elevated white blood cell count, unspecified: Secondary | ICD-10-CM

## 2021-11-04 DIAGNOSIS — I2510 Atherosclerotic heart disease of native coronary artery without angina pectoris: Secondary | ICD-10-CM | POA: Diagnosis not present

## 2021-11-04 DIAGNOSIS — R778 Other specified abnormalities of plasma proteins: Secondary | ICD-10-CM

## 2021-11-04 DIAGNOSIS — N179 Acute kidney failure, unspecified: Secondary | ICD-10-CM | POA: Diagnosis not present

## 2021-11-04 DIAGNOSIS — E669 Obesity, unspecified: Secondary | ICD-10-CM | POA: Diagnosis not present

## 2021-11-04 LAB — CBC
HCT: 35 % — ABNORMAL LOW (ref 39.0–52.0)
Hemoglobin: 11.9 g/dL — ABNORMAL LOW (ref 13.0–17.0)
MCH: 30.1 pg (ref 26.0–34.0)
MCHC: 34 g/dL (ref 30.0–36.0)
MCV: 88.6 fL (ref 80.0–100.0)
Platelets: 281 10*3/uL (ref 150–400)
RBC: 3.95 MIL/uL — ABNORMAL LOW (ref 4.22–5.81)
RDW: 14.3 % (ref 11.5–15.5)
WBC: 9.2 10*3/uL (ref 4.0–10.5)
nRBC: 0 % (ref 0.0–0.2)

## 2021-11-04 LAB — BASIC METABOLIC PANEL
Anion gap: 5 (ref 5–15)
BUN: 8 mg/dL (ref 8–23)
CO2: 29 mmol/L (ref 22–32)
Calcium: 9.2 mg/dL (ref 8.9–10.3)
Chloride: 107 mmol/L (ref 98–111)
Creatinine, Ser: 0.89 mg/dL (ref 0.61–1.24)
GFR, Estimated: >60 mL/min (ref >60)
Glucose, Bld: 120 mg/dL — ABNORMAL HIGH (ref 70–99)
Potassium: 3.8 mmol/L (ref 3.5–5.1)
Sodium: 141 mmol/L (ref 135–145)

## 2021-11-04 LAB — GLUCOSE, CAPILLARY
Glucose-Capillary: 118 mg/dL — ABNORMAL HIGH (ref 70–99)
Glucose-Capillary: 121 mg/dL — ABNORMAL HIGH (ref 70–99)
Glucose-Capillary: 134 mg/dL — ABNORMAL HIGH (ref 70–99)
Glucose-Capillary: 100 mg/dL — ABNORMAL HIGH (ref 70–99)

## 2021-11-04 LAB — MAGNESIUM: Magnesium: 1.6 mg/dL — ABNORMAL LOW (ref 1.7–2.4)

## 2021-11-04 MED ORDER — DOCUSATE SODIUM 100 MG PO CAPS: 100.0000 mg | ORAL_CAPSULE | Freq: Every day | ORAL | 0 refills | Status: DC | PRN

## 2021-11-04 MED ORDER — ASPIRIN 81 MG PO TBEC: 81.0000 mg | DELAYED_RELEASE_TABLET | Freq: Every day | ORAL | 0 refills | Status: DC

## 2021-11-04 MED ORDER — POLYETHYLENE GLYCOL 3350 17 G PO PACK: 17.0000 g | PACK | Freq: Every day | ORAL | 0 refills | Status: DC | PRN

## 2021-11-04 MED ORDER — MAGNESIUM OXIDE -MG SUPPLEMENT 400 (240 MG) MG PO TABS: 400.0000 mg | ORAL_TABLET | Freq: Two times a day (BID) | ORAL | 0 refills | Status: DC

## 2021-11-04 MED ORDER — CLOPIDOGREL BISULFATE 75 MG PO TABS: 75.0000 mg | ORAL_TABLET | Freq: Every day | ORAL | Status: DC

## 2021-11-04 NOTE — Progress Notes (Signed)
Physical Therapy Treatment Patient Details Name: Brent Young MRN: [id number] DOB: November 28, 1960 Today's Date: 11/04/2021   History of Present Illness Pt is a 61 y/o M admitted 8/11 after unwitnessed fall. MRI revealing several acute/subacute infarcts involving different vascular distributions bilaterally. 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    PT Comments    Pt continues with very impaired sitting balance, inability to stand, inability to use either L or R UE functionally to assist with mobility, L LE paresis and requires max-totalAx2 for all mobility and ADLs. Aware that pt's sister is planning on taking pt home at this time despite pt's low level of function and being dependent for transfers and ADLs. Pt with need a hoyer lift, hospital bed, and a w/c with cushion, specifically a tall back with tilt in space as pt is 6'2" and has strongly L lateral lean with tendency to slide down in the recliner with inability to prevent self from falling out of w/c. I am concerned that family will not be able to manage patient at this level of care and continue to recommend pt returning to SNF. Acute PT to cont to follow.   Recommendations for follow up therapy are one component of a multi-disciplinary discharge planning process, led by the attending physician.  Recommendations may be updated based on patient status, additional functional criteria and insurance authorization.  Follow Up Recommendations  Skilled nursing-short term rehab (<3 hours/day) (aware sister is planning on taking the pt home) Can patient physically be transported by private vehicle: No   Assistance Recommended at Discharge Frequent or constant Supervision/Assistance  Patient can return home with the following Two people to help with walking and/or transfers;A lot of help with bathing/dressing/bathroom;Assistance with cooking/housework;Assistance with  feeding;Direct supervision/assist for medications management;Direct supervision/assist for financial management;Assist for transportation;Help with stairs or ramp for entrance   Equipment Recommendations  Wheelchair (measurements PT);Wheelchair cushion (measurements PT);Hospital bed;Other (comment) (a tall back tilt in space w/c with cushion, hoyer lift as per LSW sister is going to take pt home now)    Recommendations for Other Services       Precautions / Restrictions Precautions Precautions: Fall Restrictions Weight Bearing Restrictions: No     Mobility  Bed Mobility Overal bed mobility: Needs Assistance Bed Mobility: Rolling, Sit to Sidelying Rolling: Max assist, +2 for physical assistance Sidelying to sit: Max assist, Total assist, +2 for physical assistance       General bed mobility comments: pt maxA for rolling, unable to use bilat UEs functionally to assist, only able to bring R LE off EOB, totalAX2 for trunk elevation to EOB    Transfers Overall transfer level: Needs assistance Equipment used: 2 person hand held assist Transfers: Sit to/from Stand, Bed to chair/wheelchair/BSC Sit to Stand: Max assist, +2 physical assistance (1/2 stand)     Squat pivot transfers: Max assist, Total assist, +2 physical assistance     General transfer comment: completed smaller squat pivot transfers x3 to get to chair, pt unable to use UEs to assist, unable to maintain grip around therapist, only offered pushing off of R LE, pt with significant forward lean onto PT and OT    Ambulation/Gait               General Gait Details: pt non-ambulatory   Stairs             Wheelchair Mobility    Modified Rankin (Stroke Patients  Only) Modified Rankin (Stroke Patients Only) Pre-Morbid Rankin Score: Severe disability Modified Rankin: Severe disability     Balance Overall balance assessment: Needs assistance Sitting-balance support: Feet supported, Bilateral upper  extremity supported Sitting balance-Leahy Scale: Poor Sitting balance - Comments: posterior and R lateral lean   Standing balance support: During functional activity Standing balance-Leahy Scale: Zero Standing balance comment: reliant on 2 person max A, unable to self correct, strong R lateral lean, pt can straighten out elbow to command but unable to maintain to support self at EOB                            Cognition Arousal/Alertness: Awake/alert Behavior During Therapy: WFL for tasks assessed/performed Overall Cognitive Status: No family/caregiver present to determine baseline cognitive functioning Area of Impairment: Attention, Following commands, Safety/judgement, Awareness, Problem solving                   Current Attention Level: Focused   Following Commands: Follows one step commands with increased time, Follows one step commands inconsistently Safety/Judgement: Decreased awareness of safety, Decreased awareness of deficits Awareness: Intellectual Problem Solving: Slow processing, Requires verbal cues, Decreased initiation, Difficulty sequencing, Requires tactile cues General Comments: pt with delayed processing, decreased insight to deficits and capabilities as well as unrealistic functional expectations. pt engaged in conversation with visitor that was appropriate and relevant        Exercises      General Comments General comments (skin integrity, edema, etc.): VSS      Pertinent Vitals/Pain Pain Assessment Pain Assessment: No/denies pain    Home Living                          Prior Function            PT Goals (current goals can now be found in the care plan section) Acute Rehab PT Goals PT Goal Formulation: With patient Time For Goal Achievement: 11/15/21 Potential to Achieve Goals: Fair Progress towards PT goals: Not progressing toward goals - comment    Frequency    Min 2X/week      PT Plan Current plan remains  appropriate    Co-evaluation PT/OT/SLP Co-Evaluation/Treatment: Yes Reason for Co-Treatment: Complexity of the patient's impairments (multi-system involvement) PT goals addressed during session: Mobility/safety with mobility OT goals addressed during session: ADL's and self-care      AM-PAC PT "6 Clicks" Mobility   Outcome Measure  Help needed turning from your back to your side while in a flat bed without using bedrails?: Total Help needed moving from lying on your back to sitting on the side of a flat bed without using bedrails?: Total Help needed moving to and from a bed to a chair (including a wheelchair)?: Total Help needed standing up from a chair using your arms (e.g., wheelchair or bedside chair)?: Total Help needed to walk in hospital room?: Total Help needed climbing 3-5 steps with a railing? : Total 6 Click Score: 6    End of Session Equipment Utilized During Treatment: Gait belt Activity Tolerance: Patient tolerated treatment well Patient left: with call bell/phone within reach;in chair;with chair alarm set Nurse Communication: Mobility status;Need for lift equipment PT Visit Diagnosis: Muscle weakness (generalized) (M62.81)     Time: 8295-6213 PT Time Calculation (min) (ACUTE ONLY): 27 min  Charges:  $Therapeutic Activity: 8-22 mins  Lewis Shock, PT, DPT Acute Rehabilitation Services Secure chat preferred Office #: [phone]    Iona Hansen 11/04/2021, 1:56 PM

## 2021-11-04 NOTE — Discharge Summary (Signed)
Physician Discharge Summary   Patient: Brent Young MRN: [id number] DOB: Aug 17, 1960  Admit date:     10/31/2021  Discharge date: 11/05/21  Discharge Physician: Rebekah Chesterfield Judaea Burgoon   PCP: Grayce Sessions, NP   Recommendations at discharge:    Follow up with primary care provider  in 3 to 5 days. Patient will continue aspirin and Plavix for 21 days followed by Plavix indefinitely. Check CBC BMP magnesium in the next visit.  Discharge Diagnoses: Principal Problem:   Acute CVA (cerebrovascular accident) [facility]) Active Problems:   Essential hypertension   Diabetes mellitus type 2 in obese (HCC)   Hyperlipidemia, unspecified   Impaired functional mobility, balance, gait, and endurance   ASCVD (arteriosclerotic cardiovascular disease)   Class 1 obesity   Hypomagnesemia  Resolved Problems:   Elevated troponin   Hypokalemia   Leukocytosis   AKI (acute kidney injury) [facility])  Hospital Course: 61 year old male with history of osteoarthritis, type 2 diabetes, hypertension, history of coronary artery disease, 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  Patient is a poor historian.  Patient was recently treated for UTI with antibiotic.  In the ED, patient was hemodynamically stable, found to have acute kidney injury.  Patient was also found to have several acute or more subacute to small infarcts involving multiple vascular territories.  Seen by neurology and admitted as stroke.  Skeletal survey negative including CT head and CT spine.  Following conditions were addressed during hospitalization,  Acute multiple territory ischemic stroke:   Patient has residual weakness on the left but had a right-sided weakness as well with some altered mental status.  Mental status has improved.  CT head was negative except for chronic small vessel disease. MRI of the brain showed several acute and  subacute small infarct involving multiple vascular distributions.   Carotid Doppler without significant stenosis.  Transcranial Doppler showed a highly suboptimal study.  Review of 2D echocardiogram showed LV ejection fraction of 65 to 70%, Severe left concentric ventricular hypertrophy.  Grade 1 diastolic dysfunction.  No evidence of intracardiac thrombus or shunt.  Patient was on aspirin 325 mg daily at home.  Neurology has recommended aspirin and Plavix for 3 weeks followed by Plavix indefinitely.  LDL  45.  Continue atorvastatin. Latest Hemoglobin A1c, 5.3.  Patient has been seen by physical therapy who recommended skilled nursing facility placement.  MRI of the C-spine has been ordered to rule out demyelinating disease which is pending since the patient is unable to do MRI so neurology has canceled MRI.  No further work-up planned as per neurology.  Continue Trileptal and gabapentin.   Essential hypertension: Patient was initially kept on permissive hypertension.  Will resume amlodipine, hydralazine, metoprolol, Hyzaar from home   Hyperlipidemia.  Continue statins.   Type 2 diabetes mellitus: Well-controlled.  On metformin and Jardiance as outpatient.  Continue on discharge  Recently treated UTI: Urinalysis is clear.  Off gentamicin.   Leukocytosis.  Improved.  WBC prior to discharge was 9.2   Elevated troponin.  Nonspecific elevation.  Mild elevation.  No ACS.  No chest pain.  Acute kidney injury:  Resolved.  Will resume home medications on discharge.  Received IV fluids during hospitalization.    Hypokalemia.  Replenished during hospitalization.  Latest potassium of 3.8.   Hypomagnesemia.  Magnesium of 1.6 on 11/04/2021.  We will continue magnesium oxide for next 5 days.  Chronic debility and weakness.  PT recommended  skilled nursing facility placemen but family wishes to go home with home health at this time.  Consultants: Neurology Procedures performed: None Disposition: Home with  home health. Diet recommendation:  Discharge Diet Orders (From admission, onward)     Start     Ordered   11/04/21 0000  Diet Carb Modified        11/04/21 0939           Carb modified diet DISCHARGE MEDICATION: Allergies as of 11/05/2021   No Known Allergies      Medication List     STOP taking these medications    Esomeprazole Magnesium 20 MG Tbec       TAKE these medications    acetaminophen 500 MG tablet Commonly known as: TYLENOL Take 1,000 mg by mouth in the morning, at noon, and at bedtime.   acetaminophen 650 MG suppository Commonly known as: TYLENOL Place 1 suppository (650 mg total) rectally every 4 (four) hours as needed for mild pain (or temp > 37.5 C (99.5 F)).   albuterol (2.5 MG/3ML) 0.083% nebulizer solution Commonly known as: PROVENTIL Take 3 mLs (2.5 mg total) by nebulization every 6 (six) hours as needed for wheezing or shortness of breath. What changed: reasons to take this   amLODipine 10 MG tablet Commonly known as: NORVASC Take 1 tablet (10 mg total) by mouth daily.   aspirin EC 81 MG tablet Take 1 tablet (81 mg total) by mouth daily. Swallow whole. What changed:  medication strength how much to take additional instructions   atorvastatin 40 MG tablet Commonly known as: LIPITOR Take 1 tablet (40 mg total) by mouth daily.   clopidogrel 75 MG tablet Commonly known as: Plavix Take 1 tablet (75 mg total) by mouth daily.   diclofenac Sodium 1 % Gel Commonly known as: VOLTAREN Apply 2 g topically 4 (four) times daily.   docusate sodium 100 MG capsule Commonly known as: COLACE Take 1 capsule (100 mg total) by mouth daily as needed for mild constipation or moderate constipation.   fluticasone 50 MCG/ACT nasal spray Commonly known as: FLONASE Place 1 spray into both nostrils daily. What changed: how much to take   gabapentin 300 MG capsule Commonly known as: NEURONTIN TAKE 1 CAPSULE BY MOUTH THREE TIMES A DAY What changed:  See the new instructions.   hydrALAZINE 10 MG tablet Commonly known as: APRESOLINE Take 10 mg by mouth as needed (sBP>160 or dBP>100).   hydrOXYzine 10 MG tablet Commonly known as: ATARAX Take 1 tablet (10 mg total) by mouth 3 (three) times daily as needed for anxiety.   irbesartan 75 MG tablet Commonly known as: AVAPRO Take 1 tablet (75 mg total) by mouth daily.   lidocaine 4 % cream Commonly known as: LMX Apply 1 Application topically as needed (pain). Apply to arms   linagliptin 5 MG Tabs tablet Commonly known as: TRADJENTA Take 1 tablet (5 mg total) by mouth daily.   losartan-hydrochlorothiazide 50-12.5 MG tablet Commonly known as: HYZAAR Take 1 tablet by mouth daily.   magnesium oxide 400 (240 Mg) MG tablet Commonly known as: MAG-OX Take 1 tablet (400 mg total) by mouth 2 (two) times daily for 5 days.   melatonin 3 MG Tabs tablet Take 3 mg by mouth at bedtime.   Menthol (Topical Analgesic) 5.5 % Gel Apply 1 Application topically daily. Apply to arms/shoulders   metFORMIN 1000 MG tablet Commonly known as: GLUCOPHAGE Take 1 tablet (1,000 mg  total) by mouth 2 (two) times daily with a meal.   metoprolol succinate 50 MG 24 hr tablet Commonly known as: TOPROL-XL Take 50 mg by mouth daily. Take with or immediately following a meal.   mometasone-formoterol 100-5 MCG/ACT Aero Commonly known as: DULERA Inhale 2 puffs into the lungs 2 (two) times daily.   Oxcarbazepine 300 MG tablet Commonly known as: TRILEPTAL Take 1 tablet (300 mg total) by mouth 3 (three) times daily.   pantoprazole 40 MG tablet Commonly known as: PROTONIX Take 1 tablet (40 mg total) by mouth daily.   polyethylene glycol 17 g packet Commonly known as: MIRALAX / GLYCOLAX Take 17 g by mouth daily as needed for moderate constipation or severe constipation.   Resource 2.0 Liqd Take 120 mLs by mouth in the morning and at bedtime.   traZODone 50 MG tablet Commonly known as: DESYREL Take 50 mg  by mouth at bedtime.               Durable Medical Equipment  (From admission, onward)           Start     Ordered   11/04/21 1005  For home use only DME 3 n 1  Once        11/04/21 1004   11/04/21 1004  For home use only DME standard manual wheelchair with seat cushion  Once       Comments: Patient suffers from stroke which impairs their ability to perform daily activities like bathing, dressing, and grooming in the home.  A walker will not resolve issue with performing activities of daily living. A wheelchair will allow patient to safely perform daily activities. Patient can safely propel the wheelchair in the home or has a caregiver who can provide assistance. Length of need Lifetime. Accessories: elevating leg rests (ELRs), wheel locks, extensions and anti-tippers.   11/04/21 1004   11/04/21 1003  For home use only DME Hospital bed  Once       Question Answer Comment  Length of Need 6 Months   The above medical condition requires: Patient requires the ability to reposition frequently   Head must be elevated greater than: 30 degrees   Bed type Semi-electric   Hoyer Lift Yes   Support Surface: Gel Overlay      11/04/21 1004            Follow-up Information     Schedule an appointment as soon as possible for a visit  with Kerin Perna, NP.   Specialty: Internal Medicine Contact information: [address] [address] [phone]                Subjective  Today, patient was seen and examined at bedside.  Denies interval complaints. Discharge Exam: Filed Weights   10/31/21 1059 10/31/21 1928 11/01/21 0538  Weight: 114.2 kg 90.1 kg 90.7 kg      11/05/2021    7:52 AM 11/05/2021    5:41 AM 11/05/2021    5:39 AM  Vitals with BMI  Systolic [id number]  Diastolic 88 81 85  Pulse 74 73 71   Body mass index is 27.12 kg/m.  General:  Average built, not in obvious distress HENT:   No scleral pallor or icterus noted. Oral mucosa is  moist.  Chest:    Diminished breath sounds bilaterally. No crackles or wheezes.  CVS: S1 &S2 heard. No murmur.  Regular rate and rhythm. Abdomen: Soft, nontender, nondistended.  Bowel sounds are  heard.   Extremities: No cyanosis, clubbing or edema.  Peripheral pulses are palpable. Psych: Alert, awake and oriented, normal mood CNS:  No cranial nerve deficits.  Left-sided weakness more than the right, left hemiparesis.  Mild right sided weakness Skin: Warm and dry.  No rashes noted.   Condition at discharge: good  The results of significant diagnostics from this hospitalization (including imaging, microbiology, ancillary and laboratory) are listed below for reference.   Imaging Studies: VAS [place] TRANSCRANIAL DOPPLER  Result Date: 11/02/2021  Transcranial Doppler Patient Name:  KENNET ALWORTH  Date of Exam:   11/01/2021 Medical Rec #: [id number]         Accession #:    [id number] Date of Birth: 1960-08-02         Patient Gender: M Patient Age:   2 years Exam Location:  [facility] Procedure:      VAS [place] TRANSCRANIAL DOPPLER Referring Phys: Charlene Brooke --------------------------------------------------------------------------------  Indications: Stroke. 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 Limitations: Movement of head and hand, chewing motions, thickness of hair, poor              acoustic windows Limitations for diagnostic windows: Unable to insonate right transtemporal window. Unable to insonate left transtemporal window. Comparison Study: No prior study Performing Technologist: Sharion Dove RVS  Examination Guidelines: A complete evaluation includes B-mode imaging, spectral Doppler, color Doppler, and power Doppler as needed of all accessible portions of each vessel. Bilateral testing  is considered an integral part of a complete examination. Limited examinations for reoccurring indications may be performed as noted.  +----------+-------------+----------+-----------+------------------+ RIGHT TCD Right VM (cm)Depth (cm)Pulsatility     Comment       +----------+-------------+----------+-----------+------------------+ MCA           18.00                 1.46                       +----------+-------------+----------+-----------+------------------+ ACA                                         unable to insonate +----------+-------------+----------+-----------+------------------+ Term ICA                                    unable to insonate +----------+-------------+----------+-----------+------------------+ PCA                                         unable to insonate +----------+-------------+----------+-----------+------------------+ Opthalmic     15.00                 1.79                       +----------+-------------+----------+-----------+------------------+ ICA siphon                                  unable to insonate +----------+-------------+----------+-----------+------------------+ Vertebral  unable to insonate +----------+-------------+----------+-----------+------------------+  +----------+------------+----------+-----------+------------------+ LEFT TCD  Left VM (cm)Depth (cm)Pulsatility     Comment       +----------+------------+----------+-----------+------------------+ MCA                                        unable to insonate +----------+------------+----------+-----------+------------------+ ACA                                        unable to insonate +----------+------------+----------+-----------+------------------+ Term ICA                                   unable to insonate +----------+------------+----------+-----------+------------------+ PCA                                         unable to insonate +----------+------------+----------+-----------+------------------+ Opthalmic    36.00                 1.78                       +----------+------------+----------+-----------+------------------+ ICA siphon                                 unable to insonate +----------+------------+----------+-----------+------------------+ Vertebral                                  unable to insonate +----------+------------+----------+-----------+------------------+  +------------+------+-------+             VM cm Comment +------------+------+-------+ Prox Basilar-45.00        +------------+------+-------+ Summary:  Highly suboptimal study due to poor windows throughot.antegrade flow in both opthalmics, right middle cerebral and basilar arteries noted. *See table(s) above for TCD measurements and observations.  Diagnosing physician: Antony Contras MD Electronically signed by Antony Contras MD on 11/02/2021 at 11:42:03 AM.    Final    VAS US CAROTID (at [facility] only)  Result Date: 11/01/2021 Carotid Arterial Duplex Study Patient Name:  AZRAEL DIALLO  Date of Exam:   10/31/2021 Medical Rec #: [id number]         Accession #:    [id number] Date of Birth: 01/03/1961         Patient Gender: M Patient Age:   70 years Exam Location:  [facility] Procedure:      VAS US CAROTID Referring Phys: Shanon Brow ORTIZ --------------------------------------------------------------------------------  Indications:       CVA. Risk Factors:      Hypertension, hyperlipidemia, Diabetes. Limitations        Today's exam was limited due to patient positioning, patient                    movement. Comparison Study:  No prior studies. Performing Technologist: Oliver Hum RVT  Examination Guidelines: A complete evaluation includes B-mode imaging, spectral Doppler, color Doppler, and power Doppler as needed of all accessible portions of each vessel. Bilateral testing is considered an  integral part of a complete examination. Limited examinations for reoccurring indications may be performed  as noted.  Right Carotid Findings: +----------+--------+--------+--------+--------------------------+--------+           PSV cm/sEDV cm/sStenosisPlaque Description        Comments +----------+--------+--------+--------+--------------------------+--------+ CCA Prox  83      9               smooth and heterogenous            +----------+--------+--------+--------+--------------------------+--------+ CCA Distal54      11              smooth and heterogenous            +----------+--------+--------+--------+--------------------------+--------+ ICA Prox  82      19              irregular and heterogenous         +----------+--------+--------+--------+--------------------------+--------+ ICA Distal76      23                                        tortuous +----------+--------+--------+--------+--------------------------+--------+ ECA       109     7                                                  +----------+--------+--------+--------+--------------------------+--------+ +----------+--------+-------+--------+-------------------+           PSV cm/sEDV cmsDescribeArm Pressure (mmHG) +----------+--------+-------+--------+-------------------+ [id number]                                         +----------+--------+-------+--------+-------------------+ +---------+--------+--+--------+-+---------+ VertebralPSV cm/s47EDV cm/s4Antegrade +---------+--------+--+--------+-+---------+  Left Carotid Findings: +----------+--------+--------+--------+--------------------------+--------+           PSV cm/sEDV cm/sStenosisPlaque Description        Comments +----------+--------+--------+--------+--------------------------+--------+ CCA Prox  94      16              smooth and heterogenous             +----------+--------+--------+--------+--------------------------+--------+ CCA Distal47      9               smooth and heterogenous            +----------+--------+--------+--------+--------------------------+--------+ ICA Prox  79      24              irregular and heterogenous         +----------+--------+--------+--------+--------------------------+--------+ ICA Distal113     38                                        tortuous +----------+--------+--------+--------+--------------------------+--------+ ECA       96      0                                                  +----------+--------+--------+--------+--------------------------+--------+ +----------+--------+--------+--------+-------------------+           PSV cm/sEDV cm/sDescribeArm Pressure (mmHG) +----------+--------+--------+--------+-------------------+ Subclavian100                                         +----------+--------+--------+--------+-------------------+ +---------+--------+--+--------+--+---------+  VertebralPSV cm/s63EDV cm/s26Antegrade +---------+--------+--+--------+--+---------+   Summary: Right Carotid: Velocities in the right ICA are consistent with a 1-39% stenosis. Left Carotid: Velocities in the left ICA are consistent with a 1-39% stenosis. Vertebrals: Bilateral vertebral arteries demonstrate antegrade flow. *See table(s) above for measurements and observations.  Electronically signed by Orlie Pollen on 11/01/2021 at 12:27:36 PM.    Final    ECHOCARDIOGRAM COMPLETE  Result Date: 10/31/2021    ECHOCARDIOGRAM REPORT   Patient Name:   ENIO VALLEE Date of Exam: 10/31/2021 Medical Rec #:  [id number]        Height:       73.0 in Accession #:    [id number]       Weight:       251.8 lb Date of Birth:  1960-07-06        BSA:          2.373 m Patient Age:    70 years         BP:           144/79 mmHg Patient Gender: M                HR:           90 bpm. Exam Location:  Inpatient  Procedure: 2D Echo, Cardiac Doppler and Color Doppler Indications:    Stroke  History:        Patient has prior history of Echocardiogram examinations, most                 recent 02/17/2021. Risk Factors:Diabetes and Hypertension.  Sonographer:    Jefferey Pica Referring Phys: [id number] [place]  1. Left ventricular ejection fraction, by estimation, is 65 to 70%. The left ventricle has normal function. The left ventricle has no regional wall motion abnormalities. There is severe concentric left ventricular hypertrophy. Left ventricular diastolic  parameters are consistent with Grade I diastolic dysfunction (impaired relaxation).  2. Right ventricular systolic function is normal. The right ventricular size is normal. There is mildly elevated pulmonary artery systolic pressure. The estimated right ventricular systolic pressure is [id number] mmHg.  3. Left atrial size was severely dilated.  4. Right atrial size was mildly dilated.  5. The mitral valve is degenerative. Trivial mitral valve regurgitation. No evidence of mitral stenosis. Moderate mitral annular calcification.  6. The aortic valve has an indeterminant number of cusps. There is moderate calcification of the aortic valve. There is moderate thickening of the aortic valve. Aortic valve regurgitation is moderate by PHT but visually appears only mild. Aortic valve sclerosis/calcification is present, without any evidence of aortic stenosis. Aortic regurgitation PHT measures 298 msec. Aortic valve Vmax measures 2.48 m/s.  7. The inferior vena cava is normal in size with greater than 50% respiratory variability, suggesting right atrial pressure of 3 mmHg. Conclusion(s)/Recommendation(s): No intracardiac source of embolism detected on this transthoracic study. Consider a transesophageal echocardiogram to exclude cardiac source of embolism if clinically indicated. FINDINGS  Left Ventricle: Left ventricular ejection fraction, by estimation, is 65 to  70%. The left ventricle has normal function. The left ventricle has no regional wall motion abnormalities. The left ventricular internal cavity size was normal in size. There is  severe concentric left ventricular hypertrophy. Left ventricular diastolic parameters are consistent with Grade I diastolic dysfunction (impaired relaxation). Indeterminate filling pressures. Right Ventricle: The right ventricular size is normal. No increase in right ventricular wall thickness. Right ventricular systolic function is normal. There is mildly elevated pulmonary artery  systolic pressure. The tricuspid regurgitant velocity is 3.09  m/s, and with an assumed right atrial pressure of 3 mmHg, the estimated right ventricular systolic pressure is 41.2 mmHg. Left Atrium: Left atrial size was severely dilated. Right Atrium: Right atrial size was mildly dilated. Pericardium: Trivial pericardial effusion is present. The pericardial effusion is posterior to the left ventricle. Mitral Valve: The mitral valve is degenerative in appearance. There is mild thickening of the mitral valve leaflet(s). There is mild calcification of the mitral valve leaflet(s). Moderate mitral annular calcification. Trivial mitral valve regurgitation. No evidence of mitral valve stenosis. Tricuspid Valve: The tricuspid valve is normal in structure. Tricuspid valve regurgitation is trivial. No evidence of tricuspid stenosis. Aortic Valve: The aortic valve has an indeterminant number of cusps. There is moderate calcification of the aortic valve. There is moderate thickening of the aortic valve. Aortic valve regurgitation is mild to moderate. Aortic regurgitation PHT measures 298 msec. Aortic valve sclerosis/calcification is present, without any evidence of aortic stenosis. Aortic valve peak gradient measures 24.6 mmHg. Pulmonic Valve: The pulmonic valve was normal in structure. Pulmonic valve regurgitation is not visualized. No evidence of pulmonic stenosis. Aorta:  The aortic root is normal in size and structure. Venous: The inferior vena cava is normal in size with greater than 50% respiratory variability, suggesting right atrial pressure of 3 mmHg. IAS/Shunts: No atrial level shunt detected by color flow Doppler.  LEFT VENTRICLE PLAX 2D LVIDd:         3.70 cm   Diastology LVIDs:         1.90 cm   LV e' lateral:   5.98 cm/s LV PW:         1.70 cm   LV E/e' lateral: 10.8 LV IVS:        1.50 cm LVOT diam:     2.00 cm LV SV:         75 LV SV Index:   32 LVOT Area:     3.14 cm  RIGHT VENTRICLE             IVC RV Basal diam:  3.25 cm     IVC diam: 1.70 cm RV S prime:     14.70 cm/s TAPSE (M-mode): 2.4 cm LEFT ATRIUM              Index        RIGHT ATRIUM           Index LA diam:        3.60 cm  1.52 cm/m   RA Area:     25.30 cm LA Vol (A2C):   127.0 ml 53.52 ml/m  RA Volume:   81.80 ml  34.47 ml/m LA Vol (A4C):   89.8 ml  37.85 ml/m LA Biplane Vol: 111.0 ml 46.78 ml/m  AORTIC VALVE                 PULMONIC VALVE AV Area (Vmax): 1.75 cm     PV Vmax:       0.93 m/s AV Vmax:        248.00 cm/s  PV Peak grad:  3.4 mmHg AV Peak Grad:   24.6 mmHg LVOT Vmax:      138.00 cm/s LVOT Vmean:     85.500 cm/s LVOT VTI:       0.239 m AI PHT:         298 msec  AORTA Ao Root diam: 3.30 cm Ao Asc diam:  3.10 cm MITRAL VALVE  TRICUSPID VALVE MV Area (PHT): 5.70 cm     TR Peak grad:   38.2 mmHg MV Decel Time: 133 msec     TR Vmax:        309.00 cm/s MV E velocity: 64.60 cm/s MV A velocity: 131.00 cm/s  SHUNTS MV E/A ratio:  0.49         Systemic VTI:  0.24 m                             Systemic Diam: 2.00 cm Fransico Him MD Electronically signed by Fransico Him MD Signature Date/Time: 10/31/2021/12:17:43 PM    Final    MR BRAIN WO CONTRAST  Result Date: 10/31/2021 CLINICAL DATA:  Neuro deficit, acute, stroke suspected RUE weakness EXAM: MRI HEAD WITHOUT CONTRAST TECHNIQUE: Multiplanar, multiecho pulse sequences of the brain and surrounding structures were obtained without  intravenous contrast. COMPARISON:  02/18/2021 FINDINGS: Brain: Small foci of variably reduced diffusion bilaterally including involvement of the centrum semiovale, left lentiform nucleus, and right brachium pontis. Patchy and confluent areas of T2 hyperintensity in the supratentorial and pontine white matter likely reflect similar chronic microvascular ischemic changes. There are multiple chronic small vessel infarcts involving the central white matter, deep gray nuclei, pons, and cerebellum. Wallerian degeneration is noted along the right cerebral peduncle. Few scattered foci of susceptibility likely reflect chronic microhemorrhages. Prominence of the ventricles and sulci reflects similar parenchymal volume loss. There is no intracranial mass or mass effect there is no hydrocephalus or extra-axial fluid collection. Vascular: Major vessel flow voids at the skull base are preserved. Skull and upper cervical spine: Normal marrow signal is preserved. Sinuses/Orbits: Paranasal sinuses are aerated. Orbits are unremarkable. Other: Sella is unremarkable.  Mastoid air cells are clear. IMPRESSION: Several acute and more subacute small infarcts involving different vascular distributions bilaterally. Chronic microvascular ischemic changes with multiple chronic small vessel infarcts. Electronically Signed   By: Macy Mis M.D.   On: 10/31/2021 09:15   US RENAL  Result Date: 10/31/2021 CLINICAL DATA:  [id number] with acute renal failure. EXAM: RENAL / URINARY TRACT ULTRASOUND COMPLETE COMPARISON:  None Available. FINDINGS: Right Kidney: Renal measurements: 12.3 x 7.8 x 5.5 cm = volume: 276.7 mL. Echogenicity within normal limits. No mass, stones or hydronephrosis visualized. Left Kidney: Renal measurements: 11.2 x 5.6 x 5.9 cm = volume: 192.2 mL. Echogenicity within normal limits. No mass, stones or hydronephrosis visualized. Bladder: Appears normal for degree of bladder distention. Ureteral jets were not seen during the  exam. Other: None. IMPRESSION: 1. Sonographically unremarkable kidneys. 2. Unremarkable bladder for degree of distension with ureteral jets not seen during the study. Electronically Signed   By: Telford Nab M.D.   On: 10/31/2021 07:43   CT Head Wo Contrast  Result Date: 10/31/2021 CLINICAL DATA:  Unwitnessed fall EXAM: CT HEAD WITHOUT CONTRAST CT CERVICAL SPINE WITHOUT CONTRAST TECHNIQUE: Multidetector CT imaging of the head and cervical spine was performed following the standard protocol without intravenous contrast. Multiplanar CT image reconstructions of the cervical spine were also generated. RADIATION DOSE REDUCTION: This exam was performed according to the departmental dose-optimization program which includes automated exposure control, adjustment of the mA and/or kV according to patient size and/or use of iterative reconstruction technique. COMPARISON:  03/19/2021 head CT inferior FINDINGS: CT HEAD FINDINGS Brain: No evidence of acute infarction, hemorrhage, hydrocephalus, extra-axial collection or mass lesion/mass effect. Chronic small vessel ischemic gliosis in the cerebral white matter with chronic small  vessel infarcts most notable in the right corona radiata and thalamus. Premature brain atrophy. Vascular: No hyperdense vessel or unexpected calcification. Skull: Normal. Negative for fracture or focal lesion. Sinuses/Orbits: No acute finding. CT CERVICAL SPINE FINDINGS Alignment: Straightening of cervical lordosis. Skull base and vertebrae: No acute fracture. No primary bone lesion or focal pathologic process. Soft tissues and spinal canal: No prevertebral fluid or swelling. No visible canal hematoma. Disc levels: Generalized degenerative disc space narrowing, endplate degeneration, and ridging. Foraminal narrowing greatest at C3-4. Upper chest: No evidence of injury IMPRESSION: 1. No evidence of acute intracranial or cervical spine injury. 2. Notable chronic small vessel ischemia. Electronically  Signed   By: Jorje Guild M.D.   On: 10/31/2021 06:10   CT Cervical Spine Wo Contrast  Result Date: 10/31/2021 CLINICAL DATA:  Unwitnessed fall EXAM: CT HEAD WITHOUT CONTRAST CT CERVICAL SPINE WITHOUT CONTRAST TECHNIQUE: Multidetector CT imaging of the head and cervical spine was performed following the standard protocol without intravenous contrast. Multiplanar CT image reconstructions of the cervical spine were also generated. RADIATION DOSE REDUCTION: This exam was performed according to the departmental dose-optimization program which includes automated exposure control, adjustment of the mA and/or kV according to patient size and/or use of iterative reconstruction technique. COMPARISON:  03/19/2021 head CT inferior FINDINGS: CT HEAD FINDINGS Brain: No evidence of acute infarction, hemorrhage, hydrocephalus, extra-axial collection or mass lesion/mass effect. Chronic small vessel ischemic gliosis in the cerebral white matter with chronic small vessel infarcts most notable in the right corona radiata and thalamus. Premature brain atrophy. Vascular: No hyperdense vessel or unexpected calcification. Skull: Normal. Negative for fracture or focal lesion. Sinuses/Orbits: No acute finding. CT CERVICAL SPINE FINDINGS Alignment: Straightening of cervical lordosis. Skull base and vertebrae: No acute fracture. No primary bone lesion or focal pathologic process. Soft tissues and spinal canal: No prevertebral fluid or swelling. No visible canal hematoma. Disc levels: Generalized degenerative disc space narrowing, endplate degeneration, and ridging. Foraminal narrowing greatest at C3-4. Upper chest: No evidence of injury IMPRESSION: 1. No evidence of acute intracranial or cervical spine injury. 2. Notable chronic small vessel ischemia. Electronically Signed   By: Jorje Guild M.D.   On: 10/31/2021 06:10   DG Chest 1 View  Result Date: 10/31/2021 CLINICAL DATA:  Unwitnessed fall . EXAM: CHEST  1 VIEW COMPARISON:   10/28/2021 FINDINGS: Mild left basilar atelectasis. Lungs are otherwise clear. No pneumothorax or pleural effusion. Cardiac size within normal limits. No acute bone abnormality IMPRESSION: No active disease. Electronically Signed   By: Fidela Salisbury M.D.   On: 10/31/2021 04:41   DG Chest 2 View  Result Date: 10/28/2021 CLINICAL DATA:  Cough EXAM: CHEST - 2 VIEW COMPARISON:  03/25/2021 FINDINGS: Heart and mediastinal contours are within normal limits. No focal opacities or effusions. No acute bony abnormality. IMPRESSION: No active cardiopulmonary disease. Electronically Signed   By: Rolm Baptise M.D.   On: 10/28/2021 21:05    Microbiology: Results for orders placed or performed during the hospital encounter of 10/31/21  MRSA Next Gen by PCR, Nasal     Status: None   Collection Time: 11/01/21  7:41 AM   Specimen: Nasal Mucosa; Nasal Swab  Result Value Ref Range Status   MRSA by PCR Next Gen NOT DETECTED NOT DETECTED Final    Comment: (NOTE) The GeneXpert MRSA Assay (FDA approved for NASAL specimens only), is one component of a comprehensive MRSA colonization surveillance program. It is not intended to diagnose MRSA infection nor to guide or  monitor treatment for MRSA infections. Test performance is not FDA approved in patients less than 5 years old. Performed at [facility], [place] [address], [place],  [address]     Labs: CBC: Recent Labs  Lab 10/31/21 [phone] 11/01/21 0211 11/03/21 0655 11/04/21 0407  WBC 13.1* 8.6 7.8 9.2  NEUTROABS 7.9*  --   --   --   HGB 13.0 11.2* 11.1* 11.9*  HCT 39.9 34.1* 33.3* 35.0*  MCV 92.1 89.7 89.0 88.6  PLT 297 279 290 [id number]   Basic Metabolic Panel: Recent Labs  Lab 10/31/21 0623 11/01/21 0211 11/02/21 0200 11/03/21 0655 11/04/21 0407  NA 141 142 143 142 141  K 4.3 3.5 3.4* 3.9 3.8  CL 100 104 103 105 107  CO2 27 30 29 28 29   GLUCOSE 97 81 95 123* 120*  BUN 38* 30* 14 9 8   CREATININE 3.61* 2.11* 1.04 1.00 0.89  CALCIUM 8.9  8.6* 9.1 9.0 9.2  MG  --   --  1.2* 1.3* 1.6*  PHOS  --   --  2.5  --   --    Liver Function Tests: Recent Labs  Lab 10/31/21 0623  AST 20  ALT 14  ALKPHOS 103  BILITOT 0.7  PROT 7.8  ALBUMIN 4.3   CBG: Recent Labs  Lab 11/04/21 0819 11/04/21 1232 11/04/21 1632 11/04/21 2207 11/05/21 0634  GLUCAP 118* 121* 134* 100* 94    Discharge time spent: greater than 30 minutes.  Signed: Flora Lipps, MD [facility] 11/05/2021

## 2021-11-04 NOTE — Progress Notes (Addendum)
Speech Language Pathology Treatment: Cognitive-Linquistic  Patient Details Name: Brent Young MRN: [id number] DOB: 08/08/1960 Today's Date: 11/04/2021 Time: 7353-2992 SLP Time Calculation (min) (ACUTE ONLY): 24 min  Assessment / Plan / Recommendation Clinical Impression  Pt alert and partially reclined in bed, eager to participate in skilled interventions this date. Pt oriented to self and partially to situation and place. Provided education regarding use of external memory aids (phone, room white board) in order to improve recall of current day of week and month of year. With mod verbal cues, pt utilized aid to provide all concepts related to time with 100% accuracy. Verbal problem solving scenarios regarding safety at SNF presented, with pt identifying problems and providing appropriate solutions to problems with 50% accuracy provided mod verbal cues. Pt would initially provide an appropriate course of action for a problem scenario and then would subsequently provide an unsafe/poor solution. Few instances of word finding noted during this task and informal conversation. Responsive naming and confrontational naming tasks completed with 100% accuracy. Will continue f/u during acute stay. Highly recommend f/u with SLP services at next level of care as well for ongoing treatment of cognitive functions.   Screened for swallow function with RN/pt reporting no difficulties with regular diet/thin liquids. He takes meds whole in puree without difficulty as well. He passed [facility] 8/11. No further f/u for swallow function warranted.    HPI HPI: 61yo male admitted 10/31/21 after a fall at [place]. 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 CXR = no active disease. MRI = Several acute and more subacute small infarcts involving different vascular distributions bilaterally.  Chronic microvascular ischemic changes with multiple chronic small vessel infarcts.      SLP Plan  Continue with current  plan of care      Recommendations for follow up therapy are one component of a multi-disciplinary discharge planning process, led by the attending physician.  Recommendations may be updated based on patient status, additional functional criteria and insurance authorization.    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

## 2021-11-04 NOTE — Progress Notes (Signed)
Occupational Therapy Treatment Patient Details Name: Brent Young MRN: [id number] DOB: 07/09/1960 Today's Date: 11/04/2021   History of present illness Pt is a 61 y/o M admitted 8/11 after unwitnessed fall. MRI revealing several acute/subacute infarcts involving different vascular distributions bilaterally. 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   OT comments  Pt with slow progression towards goals, able to complete seated grooming task with mod A using LUE. Pt needing max-total A +2 for all aspects of bed mobility and transfers. Pt presenting with impairments listed below, continue to recommend SNF at d/c, however should pt decline, pt will need DME listed below and 24/7 assistance.    Recommendations for follow up therapy are one component of a multi-disciplinary discharge planning process, led by the attending physician.  Recommendations may be updated based on patient status, additional functional criteria and insurance authorization.    Follow Up Recommendations  Skilled nursing-short term rehab (<3 hours/day)    Assistance Recommended at Discharge Frequent or constant Supervision/Assistance  Patient can return home with the following  Two people to help with walking and/or transfers;Two people to help with bathing/dressing/bathroom;Assistance with cooking/housework;Assistance with feeding;Direct supervision/assist for medications management;Direct supervision/assist for financial management;Assist for transportation;Help with stairs or ramp for entrance   Equipment Recommendations  BSC/3in1;Other (comment);Wheelchair (measurements OT);Wheelchair cushion (measurements OT);Hospital bed (drop arm,hoyer lift)    Recommendations for Other Services PT consult    Precautions / Restrictions Precautions Precautions: Fall Restrictions Weight Bearing Restrictions: No       Mobility Bed Mobility Overal bed  mobility: Needs Assistance     Sidelying to sit: Max assist, Total assist, +2 for physical assistance            Transfers Overall transfer level: Needs assistance Equipment used: 2 person hand held assist Transfers: Sit to/from Stand, Bed to chair/wheelchair/BSC Sit to Stand: Max assist, +2 physical assistance   Squat pivot transfers: Max assist, Total assist, +2 physical assistance       General transfer comment: taking x3 smaller squat pivot transfers to get to chair     Balance Overall balance assessment: Needs assistance Sitting-balance support: Feet supported, Bilateral upper extremity supported Sitting balance-Leahy Scale: Poor Sitting balance - Comments: posterior and R lateral lean   Standing balance support: During functional activity Standing balance-Leahy Scale: Zero Standing balance comment: reliant on 2 person max A                           ADL either performed or assessed with clinical judgement   ADL Overall ADL's : Needs assistance/impaired Eating/Feeding: Moderate assistance   Grooming: Moderate assistance                   Toilet Transfer: Total assistance;+2 for physical assistance;+2 for safety/equipment;Maximal assistance           Functional mobility during ADLs: Maximal assistance;Total assistance;+2 for physical assistance      Extremity/Trunk Assessment Upper Extremity Assessment RUE Deficits / Details: able to elevate shoulders, 0/5 strength, no active movement noted RUE Sensation: WNL RUE Coordination: decreased fine motor;decreased gross motor LUE Deficits / Details: 2/5 AROM, decreased strength and coordination, needing hand over hand assist to guide washcloth to face for grooming task LUE Sensation: WNL LUE Coordination: decreased fine motor;decreased gross motor   Lower Extremity Assessment Lower Extremity Assessment: Defer to PT evaluation  Vision   Additional Comments: no change per pt report,  disconjugate gaze noted, will further assess   Perception Perception Perception: Not tested   Praxis Praxis Praxis: Not tested    Cognition Arousal/Alertness: Awake/alert Behavior During Therapy: WFL for tasks assessed/performed Overall Cognitive Status: Impaired/Different from baseline Area of Impairment: Attention, Memory, Following commands, Safety/judgement, Awareness, Problem solving, Orientation                 Orientation Level: Situation Current Attention Level: Focused Memory: Decreased short-term memory Following Commands: Follows one step commands with increased time, Follows one step commands inconsistently Safety/Judgement: Decreased awareness of safety, Decreased awareness of deficits Awareness: Intellectual Problem Solving: Slow processing, Requires verbal cues, Decreased initiation, Difficulty sequencing, Requires tactile cues General Comments: disoriented to situation        Exercises      Shoulder Instructions       General Comments VSS    Pertinent Vitals/ Pain       Pain Assessment Pain Assessment: Faces Pain Score: 4  Faces Pain Scale: Hurts little more Pain Location: bottom Pain Descriptors / Indicators: Discomfort Pain Intervention(s): Limited activity within patient's tolerance, Monitored during session  Home Living                                          Prior Functioning/Environment              Frequency  Min 2X/week        Progress Toward Goals  OT Goals(current goals can now be found in the care plan section)  Progress towards OT goals: Progressing toward goals  Acute Rehab OT Goals Patient Stated Goal: none stated OT Goal Formulation: With patient Time For Goal Achievement: 11/15/21 Potential to Achieve Goals: Good ADL Goals Pt Will Perform Grooming: with min assist;sitting Pt Will Transfer to Toilet: with mod assist;with +2 assist;bedside commode;stand pivot transfer;squat pivot  transfer Additional ADL Goal #1: pt will complete bed mobility mod A +2 in prep for ADLs/transfers.  Plan Discharge plan remains appropriate;Frequency remains appropriate    Co-evaluation    PT/OT/SLP Co-Evaluation/Treatment: Yes Reason for Co-Treatment: Complexity of the patient's impairments (multi-system involvement);To address functional/ADL transfers;For patient/therapist safety   OT goals addressed during session: ADL's and self-care      AM-PAC OT "6 Clicks" Daily Activity     Outcome Measure   Help from another person eating meals?: A Lot Help from another person taking care of personal grooming?: A Lot Help from another person toileting, which includes using toliet, bedpan, or urinal?: Total Help from another person bathing (including washing, rinsing, drying)?: Total Help from another person to put on and taking off regular upper body clothing?: Total Help from another person to put on and taking off regular lower body clothing?: Total 6 Click Score: 8    End of Session Equipment Utilized During Treatment: Gait belt  OT Visit Diagnosis: Other abnormalities of gait and mobility (R26.89);Unsteadiness on feet (R26.81);Muscle weakness (generalized) (M62.81);History of falling (Z91.81);Other symptoms and signs involving cognitive function;Low vision, both eyes (H54.2);Hemiplegia and hemiparesis Hemiplegia - Right/Left: Right Hemiplegia - caused by: Cerebral infarction   Activity Tolerance Patient tolerated treatment well   Patient Left in chair;with call bell/phone within reach;with chair alarm set   Nurse Communication Mobility status;Need for lift equipment        Time: 1112-1140 OT Time Calculation (min): 28 min  Charges: OT General Charges $OT Visit: 1 Visit OT Treatments $Self Care/Home Management : 8-22 mins  Alfonzo Beers, OTD, OTR/L Acute Rehab [phone]   Mayer Masker 11/04/2021, 12:46 PM

## 2021-11-04 NOTE — Progress Notes (Signed)
Wheelchair:  Patient suffers from stroke which impairs their ability to perform daily activities like bathing, dressing, and grooming in the home.  A walker will not resolve issue with performing activities of daily living. A wheelchair will allow patient to safely perform daily activities. Patient can safely propel the wheelchair in the home or has a caregiver who can provide assistance. Length of need Lifetime.  Accessories: elevating leg rests (ELRs), wheel locks, extensions and anti-tippers.  Hospital Bed:  Length of Need 6 Months  The above medical condition requires: Patient requires the ability to reposition frequently  Head must be elevated greater than: 30 degrees  Bed type Semi-electric  Hoyer Lift Yes  Support Surface: Gel Overlay   3 in 1:  Pt is unable to ambulate to the restroom. Pt requires a 3 in 1 for bedside use.

## 2021-11-04 NOTE — Plan of Care (Signed)
  Problem: Activity: Goal: Risk for activity intolerance will decrease 11/04/2021 0129 by Beaulah Dinning, RN Outcome: Progressing 11/04/2021 0128 by Beaulah Dinning, RN Outcome: Progressing   Problem: Nutrition: Goal: Adequate nutrition will be maintained 11/04/2021 0129 by Beaulah Dinning, RN Outcome: Progressing 11/04/2021 0128 by Beaulah Dinning, RN Outcome: Progressing   Problem: Coping: Goal: Level of anxiety will decrease 11/04/2021 0129 by Beaulah Dinning, RN Outcome: Progressing 11/04/2021 0128 by Beaulah Dinning, RN Outcome: Progressing   Problem: Pain Managment: Goal: General experience of comfort will improve 11/04/2021 0129 by Beaulah Dinning, RN Outcome: Progressing 11/04/2021 0128 by Beaulah Dinning, RN Outcome: Progressing   Problem: Safety: Goal: Ability to remain free from injury will improve 11/04/2021 0129 by Beaulah Dinning, RN Outcome: Progressing 11/04/2021 0128 by Beaulah Dinning, RN Outcome: Progressing   Problem: Skin Integrity: Goal: Risk for impaired skin integrity will decrease 11/04/2021 0129 by Beaulah Dinning, RN Outcome: Progressing 11/04/2021 0128 by Beaulah Dinning, RN Outcome: Progressing

## 2021-11-04 NOTE — TOC Progression Note (Addendum)
Transition of Care [facility]) - Progression Note    Patient Details  Name: Brent Young MRN: [id number] Date of Birth: 06/24/1960  Transition of Care [facility]) CM/SW Contact  Baldemar Lenis, [place] Phone Number: 11/04/2021, 4:09 PM  Clinical Narrative:   CSW spoke with patient's sister, Olegario Messier, earlier today to discuss other SNF offers. Olegario Messier asked for information and said she would check them out. CSW informed Olegario Messier that patient is discharged, so a decision will need to be made soon on where he is going. Olegario Messier indicated that she wasn't happy with placing him back in a SNF unless she was happy with it, so she would take him home while she kept searching for something suitable. CSW discussed barriers with taking patient home (he won't get home health services, just DME, and caregivers through [place] can take a few weeks to be approved/arranged). Olegario Messier indicated understanding and that she would take him home. CSW discussed with Olegario Messier DME needs (hospital bed, hoyer lift, wheelchair, 3N1), and Olegario Messier said she would work on cleaning out space for equipment to be delivered. CSW placed DME referral with [facility], pending delivery.  CSW notified later in the day by RN that sister wanted to talk about resources to take the patient home. CSW spoke with sister again, explained the same information that was told to her this morning. CSW asked if sister still wanted to take the patient home or if she had changed her mind, and she said she still wanted to take the patient home with her. Sister's address is [address] [place], [place]. Patient will need PTAR home once DME is delivered. CSW updated MD with discharge plan.    Expected Discharge Plan: Home/Self Care Barriers to Discharge: Equipment Delay  Expected Discharge Plan and Services Expected Discharge Plan: Skilled Nursing Facility In-house Referral: Clinical Social Work   Post Acute Care Choice: Skilled Nursing Facility Living arrangements  for the past 2 months: Skilled Nursing Facility [facility]) Expected Discharge Date: 11/04/21                                     Social Determinants of Health (SDOH) Interventions    Readmission Risk Interventions     No data to display

## 2021-11-05 ENCOUNTER — Other Ambulatory Visit: Payer: Self-pay

## 2021-11-05 LAB — GLUCOSE, CAPILLARY
Glucose-Capillary: 94 mg/dL (ref 70–99)
Glucose-Capillary: 131 mg/dL — ABNORMAL HIGH (ref 70–99)
Glucose-Capillary: 117 mg/dL — ABNORMAL HIGH (ref 70–99)
Glucose-Capillary: 139 mg/dL — ABNORMAL HIGH (ref 70–99)

## 2021-11-05 MED ORDER — ASPIRIN 81 MG PO TBEC
81.0000 mg | DELAYED_RELEASE_TABLET | Freq: Every day | ORAL | 0 refills | Status: DC
  Filled 2021-11-05: qty 21, 21d supply, fill #0

## 2021-11-05 MED ORDER — MAGNESIUM OXIDE 400 MG PO TABS
400.0000 mg | ORAL_TABLET | Freq: Two times a day (BID) | ORAL | 0 refills | Status: DC
  Filled 2021-11-05: qty 10, 5d supply, fill #0

## 2021-11-05 MED ORDER — DOCUSATE SODIUM 100 MG PO CAPS
100.0000 mg | ORAL_CAPSULE | Freq: Every day | ORAL | 0 refills | Status: DC | PRN
  Filled 2021-11-05: qty 10, 10d supply, fill #0

## 2021-11-05 MED ORDER — CLOPIDOGREL BISULFATE 75 MG PO TABS
75.0000 mg | ORAL_TABLET | Freq: Every day | ORAL | 11 refills | Status: DC
  Filled 2021-11-05: qty 30, 30d supply, fill #0

## 2021-11-05 MED ORDER — POLYETHYLENE GLYCOL 3350 17 G PO PACK
17.0000 g | PACK | Freq: Every day | ORAL | 0 refills | Status: DC | PRN
  Filled 2021-11-05: qty 14, 14d supply, fill #0

## 2021-11-05 MED ORDER — OXYCODONE-ACETAMINOPHEN 5-325 MG PO TABS
1.0000 | ORAL_TABLET | Freq: Once | ORAL | Status: AC
  Administered 2021-11-05: 1 via ORAL
  Filled 2021-11-05: qty 1

## 2021-11-05 NOTE — Progress Notes (Signed)
Patient seen and examined at bedside.  No interval complaints.  Vitals reviewed.  Patient was discharged to skilled nursing facility yesterday but at this time wishes to go home.  No changes in the medication regimen.  Stable for discharge.  Please refer to discharge summary dated 11/04/2021.

## 2021-11-05 NOTE — Progress Notes (Signed)
Hoyer Lift:  Patient requires periodic movement to prevent further deterioration. Patient and/or caregiver unable to bodily transfer patient safely without the use of lift.  Hospital bed:  Patient can safely and independently operate bed controls and adjustments.

## 2021-11-05 NOTE — TOC Transition Note (Signed)
Transition of Care [facility]) - CM/SW Discharge Note   Patient Details  Name: THAYER EMBLETON MRN: [id number] Date of Birth: 29-Aug-1960  Transition of [facility]) CM/SW Contact:  Baldemar Lenis, LCSW Phone Number: 11/05/2021, 3:58 PM   Clinical Narrative:   CSW discussed DME delivery with [employer], updated notes and orders and sent to them for authorization. Per [employer], DME to be delivered today. CSW updated sister, Olegario Messier, who said she was having phone trouble but would look out for a call about delivery. CSW updated sister with information on caregivers from [facility] through [place], she is to review and determine preferences for caregiving agency.   CSW attempted to reach out to sister this afternoon to see if she'd heard from [employer] on DME delivery and phone is unable to receive calls. CSW contacted patient's son to discuss, and he is able to assist with DME delivery. CSW notified [employer] to call the son for delivery, son to update CSW once equipment is delivered to schedule [facility] for patient to go home.     Final next level of care: Home/Self Care Barriers to Discharge: Equipment Delay   Patient Goals and CMS Choice Patient states their goals for this hospitalization and ongoing recovery are:: "good walking"   Choice offered to / list presented to : Patient, Adult Children (son [name])  Discharge Placement                Patient to be transferred to facility by: [facility] Name of family member notified: Tami Lin Patient and family notified of of transfer: 11/05/21  Discharge Plan and Services In-house Referral: Clinical Social Work   Post Acute Care Choice: Skilled Nursing Facility                               Social Determinants of Health (SDOH) Interventions     Readmission Risk Interventions     No data to display

## 2021-11-05 NOTE — Progress Notes (Signed)
Discharge instructions called to son, [name], due to sister having phone troubles. All questions answered.

## 2021-11-06 ENCOUNTER — Other Ambulatory Visit (HOSPITAL_COMMUNITY): Payer: Self-pay

## 2021-11-06 ENCOUNTER — Other Ambulatory Visit: Payer: Self-pay

## 2021-11-06 LAB — GLUCOSE, CAPILLARY
Glucose-Capillary: 107 mg/dL — ABNORMAL HIGH (ref 70–99)
Glucose-Capillary: 148 mg/dL — ABNORMAL HIGH (ref 70–99)
Glucose-Capillary: 167 mg/dL — ABNORMAL HIGH (ref 70–99)
Glucose-Capillary: 103 mg/dL — ABNORMAL HIGH (ref 70–99)

## 2021-11-06 MED ORDER — CLOPIDOGREL BISULFATE 75 MG PO TABS
75.0000 mg | ORAL_TABLET | Freq: Every day | ORAL | 11 refills | Status: AC
  Filled 2021-11-06: qty 30, 30d supply, fill #0

## 2021-11-06 MED ORDER — DICLOFENAC SODIUM 1 % EX GEL: 2.0000 g | Freq: Four times a day (QID) | CUTANEOUS | 0 refills | Status: DC

## 2021-11-06 MED ORDER — DICLOFENAC SODIUM 1 % EX GEL: 2.0000 g | Freq: Four times a day (QID) | CUTANEOUS | Status: DC

## 2021-11-06 MED ORDER — LINAGLIPTIN 5 MG PO TABS
5.0000 mg | ORAL_TABLET | Freq: Every day | ORAL | 0 refills | Status: AC
  Filled 2021-11-06: qty 30, 30d supply, fill #0

## 2021-11-06 MED ORDER — GABAPENTIN 300 MG PO CAPS
300.0000 mg | ORAL_CAPSULE | Freq: Three times a day (TID) | ORAL | 1 refills | Status: AC
  Filled 2021-11-06: qty 90, 30d supply, fill #0

## 2021-11-06 MED ORDER — ATORVASTATIN CALCIUM 40 MG PO TABS
40.0000 mg | ORAL_TABLET | Freq: Every day | ORAL | 1 refills | Status: AC
  Filled 2021-11-06: qty 60, 60d supply, fill #0

## 2021-11-06 MED ORDER — PANTOPRAZOLE SODIUM 40 MG PO TBEC
40.0000 mg | DELAYED_RELEASE_TABLET | Freq: Every day | ORAL | 1 refills | Status: AC
  Filled 2021-11-06: qty 30, 30d supply, fill #0

## 2021-11-06 MED ORDER — MOMETASONE FURO-FORMOTEROL FUM 100-5 MCG/ACT IN AERO: 2.0000 | INHALATION_SPRAY | Freq: Two times a day (BID) | RESPIRATORY_TRACT | Status: DC

## 2021-11-06 MED ORDER — LINAGLIPTIN 5 MG PO TABS: 5.0000 mg | ORAL_TABLET | Freq: Every day | ORAL | 0 refills | Status: DC

## 2021-11-06 MED ORDER — HYDRALAZINE HCL 10 MG PO TABS
10.0000 mg | ORAL_TABLET | ORAL | 0 refills | Status: AC | PRN
Start: 2021-11-06 — End: 2021-12-06
  Filled 2021-11-06 (×2): qty 30, 30d supply, fill #0

## 2021-11-06 MED ORDER — MOMETASONE FURO-FORMOTEROL FUM 100-5 MCG/ACT IN AERO: 2.0000 | INHALATION_SPRAY | Freq: Two times a day (BID) | RESPIRATORY_TRACT | 0 refills | Status: DC

## 2021-11-06 MED ORDER — PANTOPRAZOLE SODIUM 40 MG PO TBEC: 40.0000 mg | DELAYED_RELEASE_TABLET | Freq: Every day | ORAL | Status: DC

## 2021-11-06 MED ORDER — METOPROLOL SUCCINATE ER 50 MG PO TB24
50.0000 mg | ORAL_TABLET | Freq: Every day | ORAL | 0 refills | Status: AC
  Filled 2021-11-06: qty 30, 30d supply, fill #0

## 2021-11-06 MED ORDER — FLUTICASONE PROPIONATE 50 MCG/ACT NA SUSP
1.0000 | Freq: Every day | NASAL | 2 refills | Status: AC
Start: 2021-11-06 — End: ?
  Filled 2021-11-06: qty 16, 30d supply, fill #0

## 2021-11-06 MED ORDER — MAGNESIUM OXIDE 400 MG PO TABS
400.0000 mg | ORAL_TABLET | Freq: Two times a day (BID) | ORAL | 0 refills | Status: AC
  Filled 2021-11-06: qty 10, 5d supply, fill #0

## 2021-11-06 MED ORDER — ALBUTEROL SULFATE (2.5 MG/3ML) 0.083% IN NEBU
2.5000 mg | INHALATION_SOLUTION | Freq: Four times a day (QID) | RESPIRATORY_TRACT | 12 refills | Status: DC | PRN
Start: 2021-11-06 — End: 2021-11-06

## 2021-11-06 MED ORDER — AMLODIPINE BESYLATE 10 MG PO TABS
10.0000 mg | ORAL_TABLET | Freq: Every day | ORAL | 1 refills | Status: AC
  Filled 2021-11-06: qty 90, 90d supply, fill #0

## 2021-11-06 MED ORDER — ASPIRIN 81 MG PO TBEC
81.0000 mg | DELAYED_RELEASE_TABLET | Freq: Every day | ORAL | 0 refills | Status: AC
  Filled 2021-11-06: qty 21, 21d supply, fill #0

## 2021-11-06 MED ORDER — ALBUTEROL SULFATE (2.5 MG/3ML) 0.083% IN NEBU
2.5000 mg | INHALATION_SOLUTION | Freq: Four times a day (QID) | RESPIRATORY_TRACT | 12 refills | Status: AC | PRN
  Filled 2021-11-06: qty 90, 8d supply, fill #0

## 2021-11-06 MED ORDER — DOCUSATE SODIUM 100 MG PO CAPS
100.0000 mg | ORAL_CAPSULE | Freq: Every day | ORAL | 0 refills | Status: AC | PRN
Start: 2021-11-06 — End: ?
  Filled 2021-11-06: qty 10, 10d supply, fill #0

## 2021-11-06 MED ORDER — TRAZODONE HCL 50 MG PO TABS
50.0000 mg | ORAL_TABLET | Freq: Every day | ORAL | 0 refills | Status: AC
  Filled 2021-11-06: qty 30, 30d supply, fill #0

## 2021-11-06 MED ORDER — MOMETASONE FURO-FORMOTEROL FUM 100-5 MCG/ACT IN AERO
2.0000 | INHALATION_SPRAY | Freq: Two times a day (BID) | RESPIRATORY_TRACT | 0 refills | Status: AC
  Filled 2021-11-06: qty 13, 30d supply, fill #0

## 2021-11-06 MED ORDER — MELATONIN 3 MG PO TABS
3.0000 mg | ORAL_TABLET | Freq: Every day | ORAL | 0 refills | Status: AC
  Filled 2021-11-06: qty 30, 30d supply, fill #0

## 2021-11-06 MED ORDER — LIDOCAINE 4 % EX CREA
1.0000 | TOPICAL_CREAM | CUTANEOUS | 0 refills | Status: AC | PRN
  Filled 2021-11-06: qty 30, fill #0

## 2021-11-06 MED ORDER — LINAGLIPTIN 5 MG PO TABS: 5.0000 mg | ORAL_TABLET | Freq: Every day | ORAL | Status: DC

## 2021-11-06 MED ORDER — METFORMIN HCL 1000 MG PO TABS
1000.0000 mg | ORAL_TABLET | Freq: Two times a day (BID) | ORAL | 3 refills | Status: AC
  Filled 2021-11-06: qty 180, 90d supply, fill #0

## 2021-11-06 MED ORDER — PANTOPRAZOLE SODIUM 40 MG PO TBEC: 40.0000 mg | DELAYED_RELEASE_TABLET | Freq: Every day | ORAL | 1 refills | Status: DC

## 2021-11-06 MED ORDER — ATORVASTATIN CALCIUM 40 MG PO TABS: 40.0000 mg | ORAL_TABLET | Freq: Every day | ORAL | 1 refills | Status: DC

## 2021-11-06 MED ORDER — POLYETHYLENE GLYCOL 3350 17 GM/SCOOP PO POWD
17.0000 g | Freq: Every day | ORAL | 0 refills | Status: AC | PRN
Start: 2021-11-06 — End: ?
  Filled 2021-11-06: qty 238, 14d supply, fill #0

## 2021-11-06 MED ORDER — OXCARBAZEPINE 300 MG PO TABS
300.0000 mg | ORAL_TABLET | Freq: Three times a day (TID) | ORAL | 1 refills | Status: AC
  Filled 2021-11-06: qty 90, 30d supply, fill #0

## 2021-11-06 MED ORDER — DICLOFENAC SODIUM 1 % EX GEL
2.0000 g | Freq: Four times a day (QID) | CUTANEOUS | 0 refills | Status: AC
  Filled 2021-11-06: qty 100, 30d supply, fill #0

## 2021-11-06 MED ORDER — LOSARTAN POTASSIUM-HCTZ 50-12.5 MG PO TABS
1.0000 | ORAL_TABLET | Freq: Every day | ORAL | 0 refills | Status: AC
  Filled 2021-11-06: qty 30, 30d supply, fill #0

## 2021-11-06 MED ORDER — ALBUTEROL SULFATE (2.5 MG/3ML) 0.083% IN NEBU: 2.5000 mg | INHALATION_SOLUTION | Freq: Four times a day (QID) | RESPIRATORY_TRACT | 12 refills | Status: DC | PRN

## 2021-11-06 NOTE — TOC Transition Note (Signed)
Transition of Care [facility]) - CM/SW Discharge Note   Patient Details  Name: Brent Young MRN: [id number] Date of Birth: 10-04-1960  Transition of Care [facility]) CM/SW Contact:  Baldemar Lenis, LCSW Phone Number: 11/06/2021, 4:20 PM   Clinical Narrative:   CSW spoke with sister this morning that DME was never delivered. CSW contacted [employer] to ask about delay and confirmed that DME would be delivered today. CSW confirmed with sister that she received DME, asking for a heavy duty 3N1 and a longer bed with rails. CSW contacted [employer], they can swap out the 3N1 with a new order, but bed upgrade will be an additional cost. CSW explained cost to sister, she doesn't want to pay for upgrade at this time. CSW explained how to upgrade in the future. CSW confirmed with RN that patient received meds from [facility]. Transport scheduled with [facility] for next available.   Nurse to please call family when [facility] arrives to pick up patient.    Final next level of care: Home/Self Care Barriers to Discharge: Barriers Resolved   Patient Goals and CMS Choice Patient states their goals for this hospitalization and ongoing recovery are:: "good walking"   Choice offered to / list presented to : Patient, Adult Children (son [name])  Discharge Placement                Patient to be transferred to facility by: [facility] Name of family member notified: Olegario Messier Patient and family notified of of transfer: 11/06/21  Discharge Plan and Services In-house Referral: Clinical Social Work   Post Acute Care Choice: Skilled Nursing Facility                               Social Determinants of Health (SDOH) Interventions     Readmission Risk Interventions     No data to display

## 2021-11-06 NOTE — Plan of Care (Signed)
Pt discharged home with transport. BP (!) 166/86 (BP Location: Right Arm)   Pulse 82   Temp 97.8 F (36.6 C) (Oral)   Resp 18   Ht 6' (1.829 m)   Wt 90.7 kg   SpO2 94%   BMI 27.12 kg/m  No c/o pain. Pt belongings returned, sister informed of departure. Pillow for sitting included for pt.  No other needs voiced at this time. Reva Bores 11/06/21 11:21 PM

## 2021-11-06 NOTE — Discharge Summary (Addendum)
Physician Discharge Summary   Patient: Brent Young MRN: [id number] DOB: Aug 03, 1960  Admit date:     10/31/2021  Discharge date: 11/06/21  Discharge Physician: Carlyle Lipa   PCP: Kerin Perna, NP   Recommendations at discharge:     Follow up with primary care provider  in 3 to 5 days. Patient will continue aspirin and Plavix for 21 days followed by Plavix indefinitely. Check CBC BMP magnesium in the next visit.  Discharge Diagnoses: Principal Problem:   Acute CVA (cerebrovascular accident) [facility]) Active Problems:   Essential hypertension   Diabetes mellitus type 2 in obese (HCC)   Hyperlipidemia, unspecified   Impaired functional mobility, balance, gait, and endurance   ASCVD (arteriosclerotic cardiovascular disease)   Class 1 obesity   Hypomagnesemia  Resolved Problems:   Elevated troponin   Hypokalemia   Leukocytosis   AKI (acute kidney injury) [facility])  Hospital Course: 61 year old male with history of osteoarthritis, type 2 diabetes, hypertension, history of coronary artery disease, 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  Patient is a poor historian.  Patient was recently treated for UTI with antibiotic.  In the ED, patient was hemodynamically stable, found to have acute kidney injury.  Patient was also found to have several acute or more subacute to small infarcts involving multiple vascular territories.  Seen by neurology and admitted as stroke.  Skeletal survey negative including CT head and CT spine.  The original plan per the evaluation of the physical therapist was to discharge the patient to a skilled nursing facility.  However the patient eventually changed his mind.  Supposed to be discharged on 11/04/2021 due to this change in mind patient was monitored here in the hospital no new events have occurred.  The patient is currently  being  discharged home with home health services.  Assessment and Plan: Acute multiple territory ischemic stroke:   Patient has residual weakness on the left but had a right-sided weakness as well with some altered mental status.  Mental status has improved.  CT head was negative except for chronic small vessel disease. MRI of the brain showed several acute and subacute small infarct involving multiple vascular distributions.   Carotid Doppler without significant stenosis.  Transcranial Doppler showed a highly suboptimal study.  Review of 2D echocardiogram showed LV ejection fraction of 65 to 70%, Severe left concentric ventricular hypertrophy.  Grade 1 diastolic dysfunction.  No evidence of intracardiac thrombus or shunt.  Patient was on aspirin 325 mg daily at home.  Neurology has recommended aspirin and Plavix for 3 weeks followed by Plavix indefinitely.  LDL  45.  Continue atorvastatin. Latest Hemoglobin A1c, 5.3.  Patient has been seen by physical therapy who recommended skilled nursing facility placement.  MRI of the C-spine has been ordered to rule out demyelinating disease which is pending since the patient is unable to do MRI so neurology has canceled MRI.  No further work-up planned as per neurology.  Continue Trileptal and gabapentin.   Essential hypertension: Patient was initially kept on permissive hypertension.  Will resume amlodipine, hydralazine, metoprolol, Hyzaar from home   Hyperlipidemia.  Continue statins.   Type 2 diabetes mellitus: Well-controlled.  On metformin and Jardiance as outpatient.  Continue on discharge   Recently treated UTI: Urinalysis is clear.  Off gentamicin.   Leukocytosis.  Improved.  WBC prior to discharge was 9.2   Elevated troponin.  Nonspecific elevation.  Mild elevation.  No ACS.  No chest pain.   Acute kidney injury:  Resolved.  Will resume home medications on discharge.  Received IV fluids during hospitalization.     Hypokalemia.  Replenished during  hospitalization.  Latest potassium of 3.8.   Hypomagnesemia.  Magnesium of 1.6 on 11/04/2021.  We will continue magnesium oxide for next 5 days.     Chronic debility and weakness.  PT recommended  skilled nursing facility placemen but family wishes to go home with home health at this time.         Consultants: Neurology Procedures performed: None  Disposition: Home with home health Diet recommendation:  Discharge Diet Orders (From admission, onward)     Start     Ordered   11/04/21 0000  Diet Carb Modified        11/04/21 0939           Carb modified diet DISCHARGE MEDICATION: Allergies as of 11/06/2021   No Known Allergies      Medication List     STOP taking these medications    Esomeprazole Magnesium 20 MG Tbec       TAKE these medications    acetaminophen 500 MG tablet Commonly known as: TYLENOL Take 1,000 mg by mouth in the morning, at noon, and at bedtime.   acetaminophen 650 MG suppository Commonly known as: TYLENOL Place 1 suppository (650 mg total) rectally every 4 (four) hours as needed for mild pain (or temp > 37.5 C (99.5 F)).   albuterol (2.5 MG/3ML) 0.083% nebulizer solution Commonly known as: PROVENTIL Take 3 mLs (2.5 mg total) by nebulization every 6 (six) hours as needed for wheezing or shortness of breath. What changed: reasons to take this   amLODipine 10 MG tablet Commonly known as: NORVASC Take 1 tablet (10 mg total) by mouth daily.   aspirin EC 81 MG tablet Take 1 tablet (81 mg total) by mouth daily. Swallow whole. What changed:  medication strength how much to take additional instructions   atorvastatin 40 MG tablet Commonly known as: LIPITOR Take 1 tablet (40 mg total) by mouth daily.   clopidogrel 75 MG tablet Commonly known as: Plavix Take 1 tablet (75 mg total) by mouth daily.   diclofenac Sodium 1 % Gel Commonly known as: VOLTAREN Apply 2 g topically 4 (four) times daily.   docusate sodium 100 MG  capsule Commonly known as: COLACE Take 1 capsule (100 mg total) by mouth daily as needed for mild constipation or moderate constipation.   fluticasone 50 MCG/ACT nasal spray Commonly known as: FLONASE Place 1 spray into both nostrils daily. What changed: how much to take   gabapentin 300 MG capsule Commonly known as: NEURONTIN TAKE 1 CAPSULE BY MOUTH THREE TIMES A DAY What changed: See the new instructions.   hydrALAZINE 10 MG tablet Commonly known as: APRESOLINE Take 10 mg by mouth as needed (sBP>160 or dBP>100).   hydrOXYzine 10 MG tablet Commonly known as: ATARAX Take 1 tablet (10 mg total) by mouth 3 (three) times daily as needed for anxiety.   irbesartan 75 MG tablet Commonly known as: AVAPRO Take 1 tablet (75 mg total) by mouth daily.   lidocaine 4 % cream Commonly known as: LMX Apply 1 Application topically as needed (pain). Apply to arms   linagliptin 5 MG Tabs tablet Commonly known as: TRADJENTA Take 1 tablet (5 mg total) by mouth daily.   losartan-hydrochlorothiazide 50-12.5 MG tablet Commonly known as: HYZAAR Take 1  tablet by mouth daily.   magnesium oxide 400 MG tablet Commonly known as: MAG-OX Take 1 tablet (400 mg total) by mouth 2 (two) times daily for 5 days.   melatonin 3 MG Tabs tablet Take 3 mg by mouth at bedtime.   Menthol (Topical Analgesic) 5.5 % Gel Apply 1 Application topically daily. Apply to arms/shoulders   metFORMIN 1000 MG tablet Commonly known as: GLUCOPHAGE Take 1 tablet (1,000 mg total) by mouth 2 (two) times daily with a meal.   metoprolol succinate 50 MG 24 hr tablet Commonly known as: TOPROL-XL Take 50 mg by mouth daily. Take with or immediately following a meal.   mometasone-formoterol 100-5 MCG/ACT Aero Commonly known as: DULERA Inhale 2 puffs into the lungs 2 (two) times daily.   Oxcarbazepine 300 MG tablet Commonly known as: TRILEPTAL Take 1 tablet (300 mg total) by mouth 3 (three) times daily.   pantoprazole 40  MG tablet Commonly known as: PROTONIX Take 1 tablet (40 mg total) by mouth daily.   polyethylene glycol 17 g packet Commonly known as: MIRALAX / GLYCOLAX Take 17 g by mouth daily as needed for moderate constipation or severe constipation.   Resource 2.0 Liqd Take 120 mLs by mouth in the morning and at bedtime.   traZODone 50 MG tablet Commonly known as: DESYREL Take 50 mg by mouth at bedtime.               Durable Medical Equipment  (From admission, onward)           Start     Ordered   11/04/21 1005  For home use only DME 3 n 1  Once        11/04/21 1004   11/04/21 1004  For home use only DME standard manual wheelchair with seat cushion  Once       Comments: Patient suffers from stroke which impairs their ability to perform daily activities like bathing, dressing, and grooming in the home.  A walker will not resolve issue with performing activities of daily living. A wheelchair will allow patient to safely perform daily activities. Patient can safely propel the wheelchair in the home or has a caregiver who can provide assistance. Length of need Lifetime. Accessories: elevating leg rests (ELRs), wheel locks, extensions and anti-tippers.   11/04/21 1004   11/04/21 1003  For home use only DME Hospital bed  Once       Question Answer Comment  Length of Need 6 Months   The above medical condition requires: Patient requires the ability to reposition frequently   Head must be elevated greater than: 30 degrees   Bed type Semi-electric   Hoyer Lift Yes   Support Surface: Gel Overlay      11/04/21 1004            Follow-up Information     Schedule an appointment as soon as possible for a visit  with Grayce Sessions, NP.   Specialty: Internal Medicine Contact information: [address] [place] [address] [phone]                Discharge Exam: Filed Weights   10/31/21 1059 10/31/21 1928 11/01/21 0538  Weight: 114.2 kg 90.1 kg 90.7 kg    General:  Average built, not in obvious distress HENT:   No scleral pallor or icterus noted. Oral mucosa is moist.  Chest:    Diminished breath sounds bilaterally. No crackles or wheezes.  CVS: S1 &S2 heard. No murmur.  Regular rate and rhythm. Abdomen: Soft, nontender, nondistended.  Bowel sounds are heard.   Extremities: No cyanosis, clubbing or edema.  Peripheral pulses are palpable. Psych: Alert, awake and oriented, normal mood CNS:  No cranial nerve deficits.  Left-sided weakness more than the right, left hemiparesis.  Mild right sided weakness Skin: Warm and dry.  No rashes noted  Condition at discharge: fair  The results of significant diagnostics from this hospitalization (including imaging, microbiology, ancillary and laboratory) are listed below for reference.   Imaging Studies: VAS [place] TRANSCRANIAL DOPPLER  Result Date: 11/02/2021  Transcranial Doppler Patient Name:  DEYONTE CANTALUPO  Date of Exam:   11/01/2021 Medical Rec #: [id number]         Accession #:    [id number] Date of Birth: 1961/01/01         Patient Gender: M Patient Age:   31 years Exam Location:  [facility] Procedure:      VAS [place] TRANSCRANIAL DOPPLER Referring Phys: Charlene Brooke --------------------------------------------------------------------------------  Indications: Stroke. 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 Limitations: Movement of head and hand, chewing motions, thickness of hair, poor              acoustic windows Limitations for diagnostic windows: Unable to insonate right transtemporal window. Unable to insonate left transtemporal window. Comparison Study: No prior study Performing Technologist: Sharion Dove RVS  Examination Guidelines: A complete evaluation includes B-mode imaging, spectral  Doppler, color Doppler, and power Doppler as needed of all accessible portions of each vessel. Bilateral testing is considered an integral part of a complete examination. Limited examinations for reoccurring indications may be performed as noted.  +----------+-------------+----------+-----------+------------------+ RIGHT TCD Right VM (cm)Depth (cm)Pulsatility     Comment       +----------+-------------+----------+-----------+------------------+ MCA           18.00                 1.46                       +----------+-------------+----------+-----------+------------------+ ACA                                         unable to insonate +----------+-------------+----------+-----------+------------------+ Term ICA                                    unable to insonate +----------+-------------+----------+-----------+------------------+ PCA                                         unable to insonate +----------+-------------+----------+-----------+------------------+ Opthalmic     15.00                 1.79                       +----------+-------------+----------+-----------+------------------+ ICA siphon                                  unable to insonate +----------+-------------+----------+-----------+------------------+  Vertebral                                   unable to insonate +----------+-------------+----------+-----------+------------------+  +----------+------------+----------+-----------+------------------+ LEFT TCD  Left VM (cm)Depth (cm)Pulsatility     Comment       +----------+------------+----------+-----------+------------------+ MCA                                        unable to insonate +----------+------------+----------+-----------+------------------+ ACA                                        unable to insonate +----------+------------+----------+-----------+------------------+ Term ICA                                    unable to insonate +----------+------------+----------+-----------+------------------+ PCA                                        unable to insonate +----------+------------+----------+-----------+------------------+ Opthalmic    36.00                 1.78                       +----------+------------+----------+-----------+------------------+ ICA siphon                                 unable to insonate +----------+------------+----------+-----------+------------------+ Vertebral                                  unable to insonate +----------+------------+----------+-----------+------------------+  +------------+------+-------+             VM cm Comment +------------+------+-------+ Prox Basilar-45.00        +------------+------+-------+ Summary:  Highly suboptimal study due to poor windows throughot.antegrade flow in both opthalmics, right middle cerebral and basilar arteries noted. *See table(s) above for TCD measurements and observations.  Diagnosing physician: Antony Contras MD Electronically signed by Antony Contras MD on 11/02/2021 at 11:42:03 AM.    Final    VAS US CAROTID (at [facility] only)  Result Date: 11/01/2021 Carotid Arterial Duplex Study Patient Name:  DMARCUS ALISON  Date of Exam:   10/31/2021 Medical Rec #: [id number]         Accession #:    [id number] Date of Birth: 1960/04/25         Patient Gender: M Patient Age:   83 years Exam Location:  [facility] Procedure:      VAS US CAROTID Referring Phys: Shanon Brow ORTIZ --------------------------------------------------------------------------------  Indications:       CVA. Risk Factors:      Hypertension, hyperlipidemia, Diabetes. Limitations        Today's exam was limited due to patient positioning, patient                    movement. Comparison Study:  No prior studies. Performing Technologist: Oliver Hum RVT  Examination Guidelines: A complete evaluation includes B-mode imaging, spectral  Doppler, color  Doppler, and power Doppler as needed of all accessible portions of each vessel. Bilateral testing is considered an integral part of a complete examination. Limited examinations for reoccurring indications may be performed as noted.  Right Carotid Findings: +----------+--------+--------+--------+--------------------------+--------+           PSV cm/sEDV cm/sStenosisPlaque Description        Comments +----------+--------+--------+--------+--------------------------+--------+ CCA Prox  83      9               smooth and heterogenous            +----------+--------+--------+--------+--------------------------+--------+ CCA Distal54      11              smooth and heterogenous            +----------+--------+--------+--------+--------------------------+--------+ ICA Prox  82      19              irregular and heterogenous         +----------+--------+--------+--------+--------------------------+--------+ ICA Distal76      23                                        tortuous +----------+--------+--------+--------+--------------------------+--------+ ECA       109     7                                                  +----------+--------+--------+--------+--------------------------+--------+ +----------+--------+-------+--------+-------------------+           PSV cm/sEDV cmsDescribeArm Pressure (mmHG) +----------+--------+-------+--------+-------------------+ [id number]                                         +----------+--------+-------+--------+-------------------+ +---------+--------+--+--------+-+---------+ VertebralPSV cm/s47EDV cm/s4Antegrade +---------+--------+--+--------+-+---------+  Left Carotid Findings: +----------+--------+--------+--------+--------------------------+--------+           PSV cm/sEDV cm/sStenosisPlaque Description        Comments +----------+--------+--------+--------+--------------------------+--------+ CCA Prox  94       16              smooth and heterogenous            +----------+--------+--------+--------+--------------------------+--------+ CCA Distal47      9               smooth and heterogenous            +----------+--------+--------+--------+--------------------------+--------+ ICA Prox  79      24              irregular and heterogenous         +----------+--------+--------+--------+--------------------------+--------+ ICA Distal113     38                                        tortuous +----------+--------+--------+--------+--------------------------+--------+ ECA       96      0                                                  +----------+--------+--------+--------+--------------------------+--------+ +----------+--------+--------+--------+-------------------+  PSV cm/sEDV cm/sDescribeArm Pressure (mmHG) +----------+--------+--------+--------+-------------------+ Subclavian100                                         +----------+--------+--------+--------+-------------------+ +---------+--------+--+--------+--+---------+ VertebralPSV cm/s63EDV cm/s26Antegrade +---------+--------+--+--------+--+---------+   Summary: Right Carotid: Velocities in the right ICA are consistent with a 1-39% stenosis. Left Carotid: Velocities in the left ICA are consistent with a 1-39% stenosis. Vertebrals: Bilateral vertebral arteries demonstrate antegrade flow. *See table(s) above for measurements and observations.  Electronically signed by Orlie Pollen on 11/01/2021 at 12:27:36 PM.    Final    ECHOCARDIOGRAM COMPLETE  Result Date: 10/31/2021    ECHOCARDIOGRAM REPORT   Patient Name:   GERASIMOS SCHMITH Date of Exam: 10/31/2021 Medical Rec #:  [id number]        Height:       73.0 in Accession #:    [id number]       Weight:       251.8 lb Date of Birth:  1960/11/02        BSA:          2.373 m Patient Age:    43 years         BP:           144/79 mmHg Patient Gender: M                 HR:           90 bpm. Exam Location:  Inpatient Procedure: 2D Echo, Cardiac Doppler and Color Doppler Indications:    Stroke  History:        Patient has prior history of Echocardiogram examinations, most                 recent 02/17/2021. Risk Factors:Diabetes and Hypertension.  Sonographer:    Jefferey Pica Referring Phys: [id number] [place]  1. Left ventricular ejection fraction, by estimation, is 65 to 70%. The left ventricle has normal function. The left ventricle has no regional wall motion abnormalities. There is severe concentric left ventricular hypertrophy. Left ventricular diastolic  parameters are consistent with Grade I diastolic dysfunction (impaired relaxation).  2. Right ventricular systolic function is normal. The right ventricular size is normal. There is mildly elevated pulmonary artery systolic pressure. The estimated right ventricular systolic pressure is [id number] mmHg.  3. Left atrial size was severely dilated.  4. Right atrial size was mildly dilated.  5. The mitral valve is degenerative. Trivial mitral valve regurgitation. No evidence of mitral stenosis. Moderate mitral annular calcification.  6. The aortic valve has an indeterminant number of cusps. There is moderate calcification of the aortic valve. There is moderate thickening of the aortic valve. Aortic valve regurgitation is moderate by PHT but visually appears only mild. Aortic valve sclerosis/calcification is present, without any evidence of aortic stenosis. Aortic regurgitation PHT measures 298 msec. Aortic valve Vmax measures 2.48 m/s.  7. The inferior vena cava is normal in size with greater than 50% respiratory variability, suggesting right atrial pressure of 3 mmHg. Conclusion(s)/Recommendation(s): No intracardiac source of embolism detected on this transthoracic study. Consider a transesophageal echocardiogram to exclude cardiac source of embolism if clinically indicated. FINDINGS  Left Ventricle: Left  ventricular ejection fraction, by estimation, is 65 to 70%. The left ventricle has normal function. The left ventricle has no regional wall motion abnormalities. The left ventricular internal cavity size was normal in size. There is  severe concentric left ventricular hypertrophy. Left ventricular diastolic parameters are consistent with Grade I diastolic dysfunction (impaired relaxation). Indeterminate filling pressures. Right Ventricle: The right ventricular size is normal. No increase in right ventricular wall thickness. Right ventricular systolic function is normal. There is mildly elevated pulmonary artery systolic pressure. The tricuspid regurgitant velocity is 3.09  m/s, and with an assumed right atrial pressure of 3 mmHg, the estimated right ventricular systolic pressure is [id number] mmHg. Left Atrium: Left atrial size was severely dilated. Right Atrium: Right atrial size was mildly dilated. Pericardium: Trivial pericardial effusion is present. The pericardial effusion is posterior to the left ventricle. Mitral Valve: The mitral valve is degenerative in appearance. There is mild thickening of the mitral valve leaflet(s). There is mild calcification of the mitral valve leaflet(s). Moderate mitral annular calcification. Trivial mitral valve regurgitation. No evidence of mitral valve stenosis. Tricuspid Valve: The tricuspid valve is normal in structure. Tricuspid valve regurgitation is trivial. No evidence of tricuspid stenosis. Aortic Valve: The aortic valve has an indeterminant number of cusps. There is moderate calcification of the aortic valve. There is moderate thickening of the aortic valve. Aortic valve regurgitation is mild to moderate. Aortic regurgitation PHT measures 298 msec. Aortic valve sclerosis/calcification is present, without any evidence of aortic stenosis. Aortic valve peak gradient measures 24.6 mmHg. Pulmonic Valve: The pulmonic valve was normal in structure. Pulmonic valve regurgitation is not  visualized. No evidence of pulmonic stenosis. Aorta: The aortic root is normal in size and structure. Venous: The inferior vena cava is normal in size with greater than 50% respiratory variability, suggesting right atrial pressure of 3 mmHg. IAS/Shunts: No atrial level shunt detected by color flow Doppler.  LEFT VENTRICLE PLAX 2D LVIDd:         3.70 cm   Diastology LVIDs:         1.90 cm   LV e' lateral:   5.98 cm/s LV PW:         1.70 cm   LV E/e' lateral: 10.8 LV IVS:        1.50 cm LVOT diam:     2.00 cm LV SV:         75 LV SV Index:   32 LVOT Area:     3.14 cm  RIGHT VENTRICLE             IVC RV Basal diam:  3.25 cm     IVC diam: 1.70 cm RV S prime:     14.70 cm/s TAPSE (M-mode): 2.4 cm LEFT ATRIUM              Index        RIGHT ATRIUM           Index LA diam:        3.60 cm  1.52 cm/m   RA Area:     25.30 cm LA Vol (A2C):   127.0 ml 53.52 ml/m  RA Volume:   81.80 ml  34.47 ml/m LA Vol (A4C):   89.8 ml  37.85 ml/m LA Biplane Vol: 111.0 ml 46.78 ml/m  AORTIC VALVE                 PULMONIC VALVE AV Area (Vmax): 1.75 cm     PV Vmax:       0.93 m/s AV Vmax:        248.00 cm/s  PV Peak grad:  3.4 mmHg AV Peak Grad:   24.6 mmHg LVOT Vmax:  138.00 cm/s LVOT Vmean:     85.500 cm/s LVOT VTI:       0.239 m AI PHT:         298 msec  AORTA Ao Root diam: 3.30 cm Ao Asc diam:  3.10 cm MITRAL VALVE                TRICUSPID VALVE MV Area (PHT): 5.70 cm     TR Peak grad:   38.2 mmHg MV Decel Time: 133 msec     TR Vmax:        309.00 cm/s MV E velocity: 64.60 cm/s MV A velocity: 131.00 cm/s  SHUNTS MV E/A ratio:  0.49         Systemic VTI:  0.24 m                             Systemic Diam: 2.00 cm Fransico Him MD Electronically signed by Fransico Him MD Signature Date/Time: 10/31/2021/12:17:43 PM    Final    MR BRAIN WO CONTRAST  Result Date: 10/31/2021 CLINICAL DATA:  Neuro deficit, acute, stroke suspected RUE weakness EXAM: MRI HEAD WITHOUT CONTRAST TECHNIQUE: Multiplanar, multiecho pulse sequences of the  brain and surrounding structures were obtained without intravenous contrast. COMPARISON:  02/18/2021 FINDINGS: Brain: Small foci of variably reduced diffusion bilaterally including involvement of the centrum semiovale, left lentiform nucleus, and right brachium pontis. Patchy and confluent areas of T2 hyperintensity in the supratentorial and pontine white matter likely reflect similar chronic microvascular ischemic changes. There are multiple chronic small vessel infarcts involving the central white matter, deep gray nuclei, pons, and cerebellum. Wallerian degeneration is noted along the right cerebral peduncle. Few scattered foci of susceptibility likely reflect chronic microhemorrhages. Prominence of the ventricles and sulci reflects similar parenchymal volume loss. There is no intracranial mass or mass effect there is no hydrocephalus or extra-axial fluid collection. Vascular: Major vessel flow voids at the skull base are preserved. Skull and upper cervical spine: Normal marrow signal is preserved. Sinuses/Orbits: Paranasal sinuses are aerated. Orbits are unremarkable. Other: Sella is unremarkable.  Mastoid air cells are clear. IMPRESSION: Several acute and more subacute small infarcts involving different vascular distributions bilaterally. Chronic microvascular ischemic changes with multiple chronic small vessel infarcts. Electronically Signed   By: Macy Mis M.D.   On: 10/31/2021 09:15   US RENAL  Result Date: 10/31/2021 CLINICAL DATA:  [id number] with acute renal failure. EXAM: RENAL / URINARY TRACT ULTRASOUND COMPLETE COMPARISON:  None Available. FINDINGS: Right Kidney: Renal measurements: 12.3 x 7.8 x 5.5 cm = volume: 276.7 mL. Echogenicity within normal limits. No mass, stones or hydronephrosis visualized. Left Kidney: Renal measurements: 11.2 x 5.6 x 5.9 cm = volume: 192.2 mL. Echogenicity within normal limits. No mass, stones or hydronephrosis visualized. Bladder: Appears normal for degree of bladder  distention. Ureteral jets were not seen during the exam. Other: None. IMPRESSION: 1. Sonographically unremarkable kidneys. 2. Unremarkable bladder for degree of distension with ureteral jets not seen during the study. Electronically Signed   By: Telford Nab M.D.   On: 10/31/2021 07:43   CT Head Wo Contrast  Result Date: 10/31/2021 CLINICAL DATA:  Unwitnessed fall EXAM: CT HEAD WITHOUT CONTRAST CT CERVICAL SPINE WITHOUT CONTRAST TECHNIQUE: Multidetector CT imaging of the head and cervical spine was performed following the standard protocol without intravenous contrast. Multiplanar CT image reconstructions of the cervical spine were also generated. RADIATION DOSE REDUCTION: This exam was performed according to  the departmental dose-optimization program which includes automated exposure control, adjustment of the mA and/or kV according to patient size and/or use of iterative reconstruction technique. COMPARISON:  03/19/2021 head CT inferior FINDINGS: CT HEAD FINDINGS Brain: No evidence of acute infarction, hemorrhage, hydrocephalus, extra-axial collection or mass lesion/mass effect. Chronic small vessel ischemic gliosis in the cerebral white matter with chronic small vessel infarcts most notable in the right corona radiata and thalamus. Premature brain atrophy. Vascular: No hyperdense vessel or unexpected calcification. Skull: Normal. Negative for fracture or focal lesion. Sinuses/Orbits: No acute finding. CT CERVICAL SPINE FINDINGS Alignment: Straightening of cervical lordosis. Skull base and vertebrae: No acute fracture. No primary bone lesion or focal pathologic process. Soft tissues and spinal canal: No prevertebral fluid or swelling. No visible canal hematoma. Disc levels: Generalized degenerative disc space narrowing, endplate degeneration, and ridging. Foraminal narrowing greatest at C3-4. Upper chest: No evidence of injury IMPRESSION: 1. No evidence of acute intracranial or cervical spine injury. 2.  Notable chronic small vessel ischemia. Electronically Signed   By: Jorje Guild M.D.   On: 10/31/2021 06:10   CT Cervical Spine Wo Contrast  Result Date: 10/31/2021 CLINICAL DATA:  Unwitnessed fall EXAM: CT HEAD WITHOUT CONTRAST CT CERVICAL SPINE WITHOUT CONTRAST TECHNIQUE: Multidetector CT imaging of the head and cervical spine was performed following the standard protocol without intravenous contrast. Multiplanar CT image reconstructions of the cervical spine were also generated. RADIATION DOSE REDUCTION: This exam was performed according to the departmental dose-optimization program which includes automated exposure control, adjustment of the mA and/or kV according to patient size and/or use of iterative reconstruction technique. COMPARISON:  03/19/2021 head CT inferior FINDINGS: CT HEAD FINDINGS Brain: No evidence of acute infarction, hemorrhage, hydrocephalus, extra-axial collection or mass lesion/mass effect. Chronic small vessel ischemic gliosis in the cerebral white matter with chronic small vessel infarcts most notable in the right corona radiata and thalamus. Premature brain atrophy. Vascular: No hyperdense vessel or unexpected calcification. Skull: Normal. Negative for fracture or focal lesion. Sinuses/Orbits: No acute finding. CT CERVICAL SPINE FINDINGS Alignment: Straightening of cervical lordosis. Skull base and vertebrae: No acute fracture. No primary bone lesion or focal pathologic process. Soft tissues and spinal canal: No prevertebral fluid or swelling. No visible canal hematoma. Disc levels: Generalized degenerative disc space narrowing, endplate degeneration, and ridging. Foraminal narrowing greatest at C3-4. Upper chest: No evidence of injury IMPRESSION: 1. No evidence of acute intracranial or cervical spine injury. 2. Notable chronic small vessel ischemia. Electronically Signed   By: Jorje Guild M.D.   On: 10/31/2021 06:10   DG Chest 1 View  Result Date: 10/31/2021 CLINICAL DATA:   Unwitnessed fall . EXAM: CHEST  1 VIEW COMPARISON:  10/28/2021 FINDINGS: Mild left basilar atelectasis. Lungs are otherwise clear. No pneumothorax or pleural effusion. Cardiac size within normal limits. No acute bone abnormality IMPRESSION: No active disease. Electronically Signed   By: Fidela Salisbury M.D.   On: 10/31/2021 04:41   DG Chest 2 View  Result Date: 10/28/2021 CLINICAL DATA:  Cough EXAM: CHEST - 2 VIEW COMPARISON:  03/25/2021 FINDINGS: Heart and mediastinal contours are within normal limits. No focal opacities or effusions. No acute bony abnormality. IMPRESSION: No active cardiopulmonary disease. Electronically Signed   By: Rolm Baptise M.D.   On: 10/28/2021 21:05    Microbiology: Results for orders placed or performed during the hospital encounter of 10/31/21  MRSA Next Gen by PCR, Nasal     Status: None   Collection Time: 11/01/21  7:41 AM  Specimen: Nasal Mucosa; Nasal Swab  Result Value Ref Range Status   MRSA by PCR Next Gen NOT DETECTED NOT DETECTED Final    Comment: (NOTE) The GeneXpert MRSA Assay (FDA approved for NASAL specimens only), is one component of a comprehensive MRSA colonization surveillance program. It is not intended to diagnose MRSA infection nor to guide or monitor treatment for MRSA infections. Test performance is not FDA approved in patients less than 37 years old. Performed at [facility], [address] [place], [place] [address]     Labs: CBC: Recent Labs  Lab 10/31/21 [id number] 11/01/21 0211 11/03/21 0655 11/04/21 0407  WBC 13.1* 8.6 7.8 9.2  NEUTROABS 7.9*  --   --   --   HGB 13.0 11.2* 11.1* 11.9*  HCT 39.9 34.1* 33.3* 35.0*  MCV 92.1 89.7 89.0 88.6  PLT 297 279 290 281   Basic Metabolic Panel: Recent Labs  Lab 10/31/21 0623 11/01/21 0211 11/02/21 0200 11/03/21 0655 11/04/21 0407  NA 141 142 143 142 141  K 4.3 3.5 3.4* 3.9 3.8  CL 100 104 103 105 107  CO2 27 30 29 28 29   GLUCOSE 97 81 95 123* 120*  BUN 38* 30* 14 9 8    CREATININE 3.61* 2.11* 1.04 1.00 0.89  CALCIUM 8.9 8.6* 9.1 9.0 9.2  MG  --   --  1.2* 1.3* 1.6*  PHOS  --   --  2.5  --   --    Liver Function Tests: Recent Labs  Lab 10/31/21 0623  AST 20  ALT 14  ALKPHOS 103  BILITOT 0.7  PROT 7.8  ALBUMIN 4.3   CBG: Recent Labs  Lab 11/05/21 0634 11/05/21 1145 11/05/21 1546 11/05/21 2148 11/06/21 0600  GLUCAP 94 131* 117* 139* 107*    Discharge time spent: greater than 30 minutes.  Signed: 11/07/21, MD [facility] 11/06/2021

## 2021-11-06 NOTE — Progress Notes (Signed)
Occupational Therapy Treatment Patient Details Name: Brent Young MRN: [id number] DOB: [birth year] Today's Date: 11/06/2021   History of present illness Pt is a 61 y/o M admitted 8/11 after unwitnessed fall. MRI revealing several acute/subacute infarcts involving different vascular distributions bilaterally. 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   OT comments  Patient received in supine and leaning to right. Patient was max assist to assist with positioning in bed. Patient seen to address grooming with oral care and washing face and hands. Patient was provided red foam to build up toothbrush and hand over hand assist to perform all grooming tasks. Patient is expected to discharge home today with family. Patient could benefit from further OT with HHOT.    Recommendations for follow up therapy are one component of a multi-disciplinary discharge planning process, led by the attending physician.  Recommendations may be updated based on patient status, additional functional criteria and insurance authorization.    Follow Up Recommendations  Home health OT    Assistance Recommended at Discharge Frequent or constant Supervision/Assistance  Patient can return home with the following  Two people to help with walking and/or transfers;Two people to help with bathing/dressing/bathroom;Assistance with cooking/housework;Assistance with feeding;Direct supervision/assist for medications management;Direct supervision/assist for financial management;Assist for transportation;Help with stairs or ramp for entrance   Equipment Recommendations  BSC/3in1;Wheelchair (measurements OT);Wheelchair cushion (measurements OT);Hospital bed;Other (comment) (drop arm, hoyer lift)    Recommendations for Other Services      Precautions / Restrictions Precautions Precautions: Fall Restrictions Weight Bearing Restrictions: No        Mobility Bed Mobility Overal bed mobility: Needs Assistance Bed Mobility: Rolling Rolling: Max assist         General bed mobility comments: rolling performed in bed to assist with pressure relief for bottom    Transfers                   General transfer comment: performed treatment at bed level due to expected to discharge from hospital with PTAR     Balance Overall balance assessment: Needs assistance     Sitting balance - Comments: treatment at bed level                                   ADL either performed or assessed with clinical judgement   ADL Overall ADL's : Needs assistance/impaired     Grooming: Wash/dry hands;Wash/dry face;Oral care;Moderate assistance;Bed level Grooming Details (indicate cue type and reason): mod assist with hand over hand to perform tasks.  Provided red foam to assist with grip with toothbrush                               General ADL Comments: hand over hand to perform grooming tasks    Extremity/Trunk Assessment Upper Extremity Assessment RUE Deficits / Details: able to elevate shoulders, 0/5 strength, no active movement noted RUE Sensation: WNL RUE Coordination: decreased fine motor;decreased gross motor LUE Deficits / Details: 2/5 AROM, decreased strength and coordination, needing hand over hand assist to guide washcloth to face for grooming task LUE Sensation: WNL LUE Coordination: decreased fine motor;decreased gross motor            Vision       Perception     Praxis  Cognition Arousal/Alertness: Awake/alert Behavior During Therapy: WFL for tasks assessed/performed Overall Cognitive Status: No family/caregiver present to determine baseline cognitive functioning Area of Impairment: Attention, Following commands, Safety/judgement, Awareness, Problem solving                 Orientation Level: Situation Current Attention Level: Focused Memory: Decreased short-term  memory Following Commands: Follows one step commands with increased time, Follows one step commands inconsistently Safety/Judgement: Decreased awareness of safety, Decreased awareness of deficits Awareness: Intellectual Problem Solving: Slow processing, Requires verbal cues, Decreased initiation, Difficulty sequencing, Requires tactile cues General Comments: pleasant, discussed family and career        Exercises      Shoulder Instructions       General Comments      Pertinent Vitals/ Pain       Pain Assessment Pain Assessment: Faces Faces Pain Scale: Hurts a little bit Pain Location: bottom Pain Descriptors / Indicators: Discomfort Pain Intervention(s): Monitored during session, Repositioned  Home Living                                          Prior Functioning/Environment              Frequency  Min 2X/week        Progress Toward Goals  OT Goals(current goals can now be found in the care plan section)  Progress towards OT goals: Progressing toward goals  Acute Rehab OT Goals Patient Stated Goal: go home OT Goal Formulation: With patient Time For Goal Achievement: 11/15/21 Potential to Achieve Goals: Good ADL Goals Pt Will Perform Grooming: with min assist;sitting Pt Will Transfer to Toilet: with mod assist;with +2 assist;bedside commode;stand pivot transfer;squat pivot transfer Additional ADL Goal #1: pt will complete bed mobility mod A +2 in prep for ADLs/transfers.  Plan Discharge plan remains appropriate;Frequency remains appropriate    Co-evaluation                 AM-PAC OT "6 Clicks" Daily Activity     Outcome Measure   Help from another person eating meals?: A Lot Help from another person taking care of personal grooming?: A Lot Help from another person toileting, which includes using toliet, bedpan, or urinal?: Total Help from another person bathing (including washing, rinsing, drying)?: Total Help from another  person to put on and taking off regular upper body clothing?: Total Help from another person to put on and taking off regular lower body clothing?: Total 6 Click Score: 8    End of Session    OT Visit Diagnosis: Other abnormalities of gait and mobility (R26.89);Unsteadiness on feet (R26.81);Muscle weakness (generalized) (M62.81);History of falling (Z91.81);Other symptoms and signs involving cognitive function;Low vision, both eyes (H54.2);Hemiplegia and hemiparesis Hemiplegia - Right/Left: Right Hemiplegia - caused by: Cerebral infarction   Activity Tolerance Patient tolerated treatment well   Patient Left in bed;with call bell/phone within reach;with bed alarm set   Nurse Communication Mobility status        Time: 2683-4196 OT Time Calculation (min): 19 min  Charges: OT General Charges $OT Visit: 1 Visit OT Treatments $Self Care/Home Management : 8-22 mins  Alfonse Flavors, OTA Acute Rehabilitation Services  Office [phone]   Dewain Penning 11/06/2021, 2:42 PM

## 2021-11-07 ENCOUNTER — Other Ambulatory Visit: Payer: Self-pay

## 2021-11-10 ENCOUNTER — Other Ambulatory Visit: Payer: Self-pay

## 2021-11-11 ENCOUNTER — Telehealth: Payer: Self-pay

## 2021-11-11 NOTE — Telephone Encounter (Signed)
Transition Care Management Follow-up Telephone Call  I spoke to patient's sister, Lynden Ang, to discuss patient's status and needs. Date of discharge and from where: 11/06/2021 [facility] How have you been since you were released from the hospital? She said he is not doing good.  He has been in bed since arriving at her home - 11/06/2021. She and patient's son, [name], are providing all of patient's care.  She said nothing was set up to provide them assistance at home. She also noted that the care he would receive at a facility might be more care than they can provide. They are overwhelmed.   She said someone from Medicaid came to the house to assess him for PCS.  The number of hours of support has not been determined. I explained that even if he is approved for PCS, the hours are still very limited.  Any questions or concerns? Yes- the are overwhelmed with his care  Items Reviewed: Did the pt receive and understand the discharge instructions provided? Yes  Medications obtained and verified?  She said his son manages the medications and she thinks he has everything.  Other? No  Any new allergies since your discharge? No  Dietary orders reviewed? No Do you have support at home? Yes -his sister and his son. His sister also cares for their 17 year old mother who has dementia.   Home Care and Equipment/Supplies: Were home health services ordered? no If so, what is the name of the agency? N/a  Has the agency set up a time to come to the patient's home? not applicable Were any new equipment or medical supplies ordered?  Yes: hospital bed, wheelchair, 3:1 commode and hoyer lift What is the name of the medical supply agency? [employer] Were you able to get the supplies/equipment? Yes- however he needs a bigger BSC Do you have any questions related to the use of the equipment or supplies? Yes: She said that the equipment was just dropped off and no one showed them how to use it.  I instructed her  to call [employer] and request someone come out to the home to show them how to use the equipment.  They do not know how to use the hoyer lift and have not been able to get him out of bed.   Functional Questionnaire: (I = Independent and D = Dependent) ADLs: dependent for all care needs. Has to be fed. Hoyer transfer. Using pull ups.    Follow up appointments reviewed:  PCP Hospital f/u appt confirmed? Yes  Scheduled to see Efrain Sella, NP - 12/02/2021  Specialist Hospital f/u appt confirmed? Yes  Scheduled to see neurology- 12/16/2021.  Are transportation arrangements needed? Yes - I gave his sister the number for DSS to call to arrange transportation to his medical appointments. I also explained that they need a 3 day notice to arrange th rides.  He will need stretcher to transport him if they are not able to get him into a wheelchair.  If their condition worsens, is the pt aware to call PCP or go to the Emergency Dept.? Yes Was the patient provided with contact information for the PCP's office or ED? Yes Was to pt encouraged to call back with questions or concerns? Yes

## 2021-11-12 ENCOUNTER — Other Ambulatory Visit: Payer: Self-pay

## 2021-11-12 NOTE — Telephone Encounter (Signed)
Pt sister, Olegario Messier, needs to speak to Erskine Squibb again as he has a dr appt. Request Erskine Squibb return call at [phone] asap

## 2021-11-13 NOTE — Telephone Encounter (Signed)
Call returned to patient's sister, Lynden Ang.  She explained that the [facility] aide started and they were told that he would receive 80 hours of PCS but she was not sure if that was 80 hours/ week or per month or 80 hours total.  She then said that another nurse came out to assess him and she is waiting to hear how many hours will be approved for now.  Regarding transportation, Lynden Ang called DSS and was told to call back around 11/19/2021 because it is too early to schedule a ride for his 12/02/2021 appointment at [facility].  Cathy then explained that the aide showed her how to use the hoyer lift, however, the wheelchair is too small and the [facility] is also too small.  She said he is going stir crazy  being in bed and she needs to be able to get him up to the wheelchair. I instructed her to call [employer] and request a larger wheelchair and BSC, if they need additional orders, they should call the provider who ordered the DME. I provided her with the phone number for [employer] as well as the main number for [facility]. She knows that the SW who assisted with discharge was Blenda Nicely, so if she needs additional assistance, she will call the hospital and request to speak with Ms Marilynne Halsted.  Patient's PCP has not seen him in over 1 year and his appointment is not until 12/03/2021.

## 2021-11-16 ENCOUNTER — Other Ambulatory Visit: Payer: Self-pay

## 2021-11-16 ENCOUNTER — Emergency Department (HOSPITAL_COMMUNITY)
Admission: EM | Admit: 2021-11-16 | Discharge: 2021-11-17 | Disposition: A | Discharge status: Home / Self Care | Payer: Medicaid Other | Attending: Emergency Medicine | Admitting: Emergency Medicine

## 2021-11-16 ENCOUNTER — Encounter (HOSPITAL_COMMUNITY): Payer: Self-pay

## 2021-11-16 DIAGNOSIS — Z79899 Other long term (current) drug therapy: Secondary | ICD-10-CM | POA: Diagnosis not present

## 2021-11-16 DIAGNOSIS — Z7984 Long term (current) use of oral hypoglycemic drugs: Secondary | ICD-10-CM | POA: Insufficient documentation

## 2021-11-16 DIAGNOSIS — K5900 Constipation, unspecified: Secondary | ICD-10-CM

## 2021-11-16 DIAGNOSIS — Z7982 Long term (current) use of aspirin: Secondary | ICD-10-CM | POA: Insufficient documentation

## 2021-11-16 DIAGNOSIS — R4182 Altered mental status, unspecified: Secondary | ICD-10-CM | POA: Diagnosis not present

## 2021-11-16 DIAGNOSIS — Z7902 Long term (current) use of antithrombotics/antiplatelets: Secondary | ICD-10-CM | POA: Diagnosis not present

## 2021-11-16 DIAGNOSIS — K5641 Fecal impaction: Secondary | ICD-10-CM | POA: Diagnosis not present

## 2021-11-16 DIAGNOSIS — R109 Unspecified abdominal pain: Secondary | ICD-10-CM | POA: Diagnosis present

## 2021-11-16 LAB — BASIC METABOLIC PANEL
Anion gap: 12 (ref 5–15)
BUN: 18 mg/dL (ref 8–23)
CO2: 30 mmol/L (ref 22–32)
Calcium: 9.7 mg/dL (ref 8.9–10.3)
Chloride: 104 mmol/L (ref 98–111)
Creatinine, Ser: 0.83 mg/dL (ref 0.61–1.24)
GFR, Estimated: >60 mL/min (ref >60)
Glucose, Bld: 105 mg/dL — ABNORMAL HIGH (ref 70–99)
Potassium: 3.7 mmol/L (ref 3.5–5.1)
Sodium: 146 mmol/L — ABNORMAL HIGH (ref 135–145)

## 2021-11-16 LAB — CBC WITH DIFFERENTIAL/PLATELET
Abs Immature Granulocytes: 0.07 10*3/uL (ref 0.00–0.07)
Basophils Absolute: 0.1 10*3/uL (ref 0.0–0.1)
Basophils Relative: 1 %
Eosinophils Absolute: 0.1 10*3/uL (ref 0.0–0.5)
Eosinophils Relative: 1 %
HCT: 40.4 % (ref 39.0–52.0)
Hemoglobin: 13.2 g/dL (ref 13.0–17.0)
Immature Granulocytes: 1 %
Lymphocytes Relative: 15 %
Lymphs Abs: 2 10*3/uL (ref 0.7–4.0)
MCH: 29.6 pg (ref 26.0–34.0)
MCHC: 32.7 g/dL (ref 30.0–36.0)
MCV: 90.6 fL (ref 80.0–100.0)
Monocytes Absolute: 1.4 10*3/uL — ABNORMAL HIGH (ref 0.1–1.0)
Monocytes Relative: 10 %
Neutro Abs: 10.4 10*3/uL — ABNORMAL HIGH (ref 1.7–7.7)
Neutrophils Relative %: 72 %
Platelets: 428 10*3/uL — ABNORMAL HIGH (ref 150–400)
RBC: 4.46 MIL/uL (ref 4.22–5.81)
RDW: 14.3 % (ref 11.5–15.5)
WBC: 14.1 10*3/uL — ABNORMAL HIGH (ref 4.0–10.5)
nRBC: 0 % (ref 0.0–0.2)

## 2021-11-16 LAB — URINALYSIS, ROUTINE W REFLEX MICROSCOPIC
Bilirubin Urine: NEGATIVE
Glucose, UA: NEGATIVE mg/dL
Hgb urine dipstick: NEGATIVE
Ketones, ur: 5 mg/dL — AB
Leukocytes,Ua: NEGATIVE
Nitrite: NEGATIVE
Protein, ur: NEGATIVE mg/dL
Specific Gravity, Urine: 1.013 (ref 1.005–1.030)
pH: 7 (ref 5.0–8.0)

## 2021-11-16 MED ORDER — FLEET ENEMA 7-19 GM/118ML RE ENEM
1.0000 | ENEMA | Freq: Once | RECTAL | Status: AC
Start: 2021-11-16 — End: 2021-11-16
  Administered 2021-11-16: 1 via RECTAL

## 2021-11-16 MED ORDER — FLEET ENEMA 7-19 GM/118ML RE ENEM
1.0000 | ENEMA | Freq: Once | RECTAL | Status: AC
  Administered 2021-11-16: 1 via RECTAL
  Filled 2021-11-16: qty 1

## 2021-11-16 NOTE — ED Notes (Signed)
[facility] called for transportation back home. 

## 2021-11-16 NOTE — Discharge Instructions (Addendum)
Take 2 scoops of miralax daily until he is having bowel movements regularly.  If he starts having watery stools or diarrhea you can decrease miralax to 1 scoop daily or 1 scoop every other day.  No evidence of kidney problems today or urinary tract infection.  Make sure he stays hydrated.

## 2021-11-16 NOTE — ED Provider Notes (Signed)
[facility]-EMERGENCY DEPT Provider Note   CSN: [id number] Arrival date & time: 11/16/21  1840     History  Chief Complaint  Patient presents with   Constipation   Altered Mental Status    Brent Young is a 61 y.o. male.  Patient is a 61 year old male with multiple medical problems including multiple CVAs who had been in rehab since November and last CVA was 2 weeks ago and since that time he was discharged home to his sister's house and she is the one providing the history.  She reports since he has been home he has not had a bowel movement.  They have been trying stool softeners, prune juice and various other remedies to get him to use the bathroom but he has not.  He has still been eating a normal diet until today when he started complaining of abdominal pain and cramping.  He has not had any nausea or vomiting but has also not eaten today.  She reports no other acute issues at this time.  The history is provided by the patient, a caregiver and medical records.  Constipation Altered Mental Status      Home Medications Prior to Admission medications   Medication Sig Start Date End Date Taking? Authorizing Provider  acetaminophen (TYLENOL) 500 MG tablet Take 1,000 mg by mouth in the morning, at noon, and at bedtime.   Yes [provider]  albuterol (PROVENTIL) (2.5 MG/3ML) 0.083% nebulizer solution Take 3 mLs (2.5 mg total) by nebulization every 6 (six) hours as needed for wheezing or shortness of breath. 11/06/21 01/26/22 Yes Pudota, Elsie Ra, MD  amLODipine (NORVASC) 10 MG tablet Take 1 tablet (10 mg total) by mouth daily. 11/06/21  Yes Pudota, Elsie Ra, MD  aspirin EC 81 MG tablet Take 1 tablet (81 mg total) by mouth daily. Swallow whole. 11/06/21  Yes Pudota, Elsie Ra, MD  atorvastatin (LIPITOR) 40 MG tablet Take 1 tablet (40 mg total) by mouth daily. 11/06/21 03/06/22 Yes Pudota, Elsie Ra, MD  clopidogrel (PLAVIX) 75 MG tablet Take 1 tablet  (75 mg total) by mouth daily. 11/06/21 11/06/22 Yes Pudota, Elsie Ra, MD  diclofenac Sodium (VOLTAREN) 1 % GEL Apply 2 g topically 4 (four) times daily. 11/06/21  Yes Pudota, Elsie Ra, MD  docusate sodium (COLACE) 100 MG capsule Take 1 capsule (100 mg total) by mouth daily as needed for mild constipation or moderate constipation. 11/06/21  Yes Pudota, Elsie Ra, MD  fluticasone (FLONASE) 50 MCG/ACT nasal spray Place 1 spray into both nostrils daily. 11/06/21  Yes Pudota, Elsie Ra, MD  gabapentin (NEURONTIN) 300 MG capsule Take 1 capsule (300 mg total) by mouth 3 (three) times daily. 11/06/21  Yes Pudota, Elsie Ra, MD  hydrALAZINE (APRESOLINE) 10 MG tablet Take 1 tablet (10 mg total) by mouth as needed (sBP>160 or dBP>100). 11/06/21 12/06/21 Yes Pudota, Elsie Ra, MD  hydrOXYzine (ATARAX) 10 MG tablet Take 10 mg by mouth 3 (three) times daily as needed.   Yes [provider]  irbesartan (AVAPRO) 75 MG tablet Take 75 mg by mouth daily.   Yes [provider]  lidocaine (LMX) 4 % cream Apply 1 Application topically as needed (pain). Apply to arms 11/06/21  Yes Pudota, Elsie Ra, MD  linagliptin (TRADJENTA) 5 MG TABS tablet Take 1 tablet (5 mg total) by mouth daily. 11/06/21 12/06/21 Yes Pudota, Elsie Ra, MD  losartan-hydrochlorothiazide (HYZAAR) 50-12.5 MG tablet Take 1 tablet by mouth daily. 11/06/21  Yes Danella Deis  P, MD  melatonin 3 MG TABS tablet Take 1 tablet (3 mg total) by mouth at bedtime. 11/06/21  Yes Pudota, Elsie Ra, MD  metFORMIN (GLUCOPHAGE) 1000 MG tablet Take 1 tablet (1,000 mg total) by mouth 2 (two) times daily with a meal. 11/06/21  Yes Pudota, Elsie Ra, MD  metoprolol succinate (TOPROL-XL) 50 MG 24 hr tablet Take 1 tablet (50 mg total) by mouth daily. Take with or immediately following a meal. 11/06/21  Yes Pudota, Elsie Ra, MD  mometasone-formoterol (DULERA) 100-5 MCG/ACT AERO Inhale 2 puffs into the lungs 2 (two) times daily. 11/06/21  Yes Pudota, Elsie Ra, MD  Oxcarbazepine (TRILEPTAL) 300 MG tablet Take 1 tablet (300 mg total) by mouth 3 (three) times daily. 11/06/21  Yes Pudota, Elsie Ra, MD  pantoprazole (PROTONIX) 40 MG tablet Take 1 tablet (40 mg total) by mouth daily. 11/06/21 01/17/22 Yes Pudota, Elsie Ra, MD  polyethylene glycol powder (GLYCOLAX/MIRALAX) 17 GM/SCOOP powder Take 17 g by mouth daily as needed for moderate constipation or severe constipation. 11/06/21  Yes Pudota, Elsie Ra, MD  traZODone (DESYREL) 50 MG tablet Take 1 tablet (50 mg total) by mouth at bedtime. 11/06/21  Yes Pudota, Elsie Ra, MD  acetaminophen (TYLENOL) 650 MG suppository Place 1 suppository (650 mg total) rectally every 4 (four) hours as needed for mild pain (or temp > 37.5 C (99.5 F)). Patient not taking: Reported on 11/04/2021 03/24/21   Jacquelynn Cree, PA-C      Allergies    Patient has no known allergies.    Review of Systems   Review of Systems  Gastrointestinal:  Positive for constipation.    Physical Exam Updated Vital Signs BP 130/79   Pulse (!) 103   Temp 98 F (36.7 C)   Resp 14   Ht 6' (1.829 m)   Wt 90.7 kg   SpO2 95%   BMI 27.12 kg/m  Physical Exam Vitals and nursing note reviewed.  Constitutional:      General: He is not in acute distress.    Appearance: He is well-developed.  HENT:     Head: Normocephalic and atraumatic.  Eyes:     Conjunctiva/sclera: Conjunctivae normal.     Pupils: Pupils are equal, round, and reactive to light.  Cardiovascular:     Rate and Rhythm: Normal rate and regular rhythm.     Heart sounds: No murmur heard. Pulmonary:     Effort: Pulmonary effort is normal. No respiratory distress.     Breath sounds: Normal breath sounds. No wheezing or rales.  Abdominal:     General: There is distension.     Palpations: Abdomen is soft.     Tenderness: There is no abdominal tenderness. There is no guarding or rebound.  Genitourinary:    Comments: Fecal impaction Musculoskeletal:        General: No  tenderness. Normal range of motion.     Cervical back: Normal range of motion and neck supple.  Skin:    General: Skin is warm and dry.     Coloration: Skin is pale.     Findings: No erythema or rash.  Neurological:     Mental Status: He is alert and oriented to person, place, and time.     Comments: Left upper and lower ext weakness  Psychiatric:        Behavior: Behavior normal.     ED Results / Procedures / Treatments   Labs (all labs ordered are listed, but only abnormal results are  displayed) Labs Reviewed  CBC WITH DIFFERENTIAL/PLATELET - Abnormal; Notable for the following components:      Result Value   WBC 14.1 (*)    Platelets 428 (*)    Neutro Abs 10.4 (*)    Monocytes Absolute 1.4 (*)    All other components within normal limits  BASIC METABOLIC PANEL - Abnormal; Notable for the following components:   Sodium 146 (*)    Glucose, Bld 105 (*)    All other components within normal limits  URINALYSIS, ROUTINE W REFLEX MICROSCOPIC - Abnormal; Notable for the following components:   Ketones, ur 5 (*)    All other components within normal limits    EKG None  Radiology No results found.  Procedures Procedures    Medications Ordered in ED Medications  sodium phosphate (FLEET) 7-19 GM/118ML enema 1 enema (1 enema Rectal Given 11/16/21 2023)  sodium phosphate (FLEET) 7-19 GM/118ML enema 1 enema (1 enema Rectal Given 11/16/21 2050)    ED Course/ Medical Decision Making/ A&P                           Medical Decision Making Amount and/or Complexity of Data Reviewed Labs: ordered.  Risk OTC drugs.   Pt with multiple medical problems and comorbidities and presenting today with a complaint that caries a high risk for morbidity and mortality.  Who is presenting today with diffuse abdominal pain and constipation for the last 2 weeks.  Patient is awake and alert and when I initially entered the room he is requesting a bedpan because he feels like he has to have a  bowel movement.  Spoke with patient's sister who gives the whole story.  Her only concern is that today he started developing abdominal pain and he has not had a bowel movement in 2 weeks.  Otherwise he has not had vomiting or fever.  11:15 PM Upon arrival to the emergency room patient had a mild large soft bowel movement.  He reports his abdomen no longer hurts and has no abdominal pain on exam.  However on rectal exam patient still has a large amount of stool in his rectum.  Initial enema without much relief and a second fleets enema was attempted.  11:15 PM After enemas patient has had a little more stool removed.  There is a little stool still at fingertip but feel that this can be taking care of at home with MiraLAX regularly.  Discussing with the patient's son who is now at bedside they were also worried because his urine has been smelling strongly and they wanted to make sure he did not have a UTI as he does carry a history of that in the past.  CBC, BMP and UA are pending.  11:15 PM I independently interpreted patient's labs and CBC with a leukocytosis of 14 but no other acute findings, BMP in normal limits and UA without evidence of infection.  Findings discussed with the patient and his son.  At this time patient appears stable for discharge back to his home.  He will start doing MiraLAX daily until he is having regular bowel movements.         Final Clinical Impression(s) / ED Diagnoses Final diagnoses:  Constipation, unspecified constipation type    Rx / DC Orders ED Discharge Orders     None         Gwyneth Sprout, MD 11/16/21 2316

## 2021-11-16 NOTE — ED Triage Notes (Signed)
Patient BIBA from home. Patient sister states patient has not had a bowel movement in 2 weeks and is more confused than normally. Patient recently admitted for a stroke. Patient on plavix.  Vital signs were:   174/90 93% on room air

## 2021-11-16 NOTE — ED Notes (Addendum)
Peri care provided after incontinent loose BM.

## 2021-11-25 ENCOUNTER — Telehealth: Payer: Self-pay

## 2021-11-25 ENCOUNTER — Telehealth (INDEPENDENT_AMBULATORY_CARE_PROVIDER_SITE_OTHER): Payer: Self-pay | Admitting: Primary Care

## 2021-11-25 NOTE — Telephone Encounter (Signed)
Patients sister called to report that patient did take an at home covid test which was positive. She is asking if any medication can be prescribed? Also asking does his appt on 9/12 need to be switched to virtual or rescheduled? Please contact patient sister Lynden Ang with advise. Vivien Rota

## 2021-11-25 NOTE — Telephone Encounter (Signed)
Patient's sister, Lynden Ang, called to report that she was diagnosed with COVID last week. Now the patient has a non -productive cough. He does not have a fever or body aches,or shortness of breath, no loss of taste smell reported. She said he just seems weak and a little more confused. She has been keeping him hydrated. She has been wearing a mask when near him and she said she put a mask on him.  He receives PCS: 3 hours /day M-F and 2 hours S/S. Lynden Ang has a home COVID test and she said the aide told her she would test the patient but she did not do so today. She wants to know if there is any medication that should be prescribed for him.  He has an appointment at [facility] next week - 12/02/2021. She can arrange transportation if needed and will have his son accompany him. Do you want to change to virtual visit rather than cancel and re-schedule?  Marcelino Duster- please advise

## 2021-11-26 NOTE — Telephone Encounter (Signed)
Per encounter note from Erskine Squibb, pt sister reported non-productive cough. No fever, chills or bodyaches. Also they wanted to know if appt needed to be changed to virtual, rescheduled or if he should still come to office for appt on 9/12.

## 2021-11-27 NOTE — Telephone Encounter (Signed)
Called all numbers listed number not available no advice given

## 2021-12-01 ENCOUNTER — Telehealth (INDEPENDENT_AMBULATORY_CARE_PROVIDER_SITE_OTHER): Payer: Self-pay | Admitting: Primary Care

## 2021-12-01 NOTE — Telephone Encounter (Signed)
Patients sister called for an update on the letter she needs sent to DSS to allow patients son to ride with him to his appointment. Please follow up with Olegario Messier.

## 2021-12-01 NOTE — Telephone Encounter (Signed)
Returned call to pt sister Brent Young. And per Brent Young she would like appt virtual due to transportation. Appt has been changed to virtual

## 2021-12-01 NOTE — Telephone Encounter (Signed)
FYI

## 2021-12-01 NOTE — Telephone Encounter (Signed)
Pts Sister Lynden Ang is calling to speak to Erskine Squibb to fax letter to DSS transportation for his son to ride on transportation with the pt. Please advise CB- [phone]

## 2021-12-01 NOTE — Telephone Encounter (Signed)
Returned call to pt sister Cathy. And per Cathy she would like appt virtual due to transportation. Appt has been changed to virtual  

## 2021-12-01 NOTE — Telephone Encounter (Signed)
Copied from CRM [id number]. Topic: Appointment Scheduling - Scheduling Inquiry for Clinic >> Dec 01, 2021  4:09 PM Esperanza Sheets wrote: Pt sister Olegario Messier inquiring if tomorrows 9-12 HFU can be done by telephone due to pt not having transportation  Please assist further >> Dec 01, 2021  4:13 PM Elvina Sidle wrote: Left note for Marcelino Duster re: changing to phone visit

## 2021-12-01 NOTE — Telephone Encounter (Signed)
I spoke to his sister, Lynden Ang,  this afternoon and I just faxed a letter to DSS transportation requesting his son accompany him to the appt.  She told me pick up was scheduled for 1400.    She must have just changed her mind about the in person appt.

## 2021-12-01 NOTE — Telephone Encounter (Signed)
I spoke to patient's sister, Lynden Ang who explained that the patient needs his son to accompany him to his appointment at [facility] tomorrow and DSS is requesting a letter stating that the son must accompany him. Letter can be faxed to [phone].  If for some reason the transportation cannot be arranged, she said a virtual visit would be fine   I faxed the letter as his sister requested.

## 2021-12-02 ENCOUNTER — Telehealth (INDEPENDENT_AMBULATORY_CARE_PROVIDER_SITE_OTHER): Payer: Medicaid Other | Admitting: Primary Care

## 2021-12-03 ENCOUNTER — Telehealth: Payer: Self-pay

## 2021-12-03 NOTE — Telephone Encounter (Signed)
I spoke to [employer] who confirmed that the patient has already been approved for [facility] and he started receiving services with [facility] on 11/13/2021.

## 2021-12-08 ENCOUNTER — Telehealth: Payer: Self-pay

## 2021-12-08 NOTE — Telephone Encounter (Signed)
Call received from patient's sister, Brent Young.  She is requesting an order for a larger BSC.    She is also inquiring about the status of the referral for home health - RN, PT,OT, ST. She thought it was placed after the virtual appointment on 12/01/2021. She said her brother is laying in bed and they are not trained how to care for him.    I told her that I would check with Ms Oletta Lamas, NP . I also explained to her that there is no guarantee that a home health agency will accept the referral. They have to be in network with his insurance and have staffing available to accept it.  I suggested that skilled care facility might be the best option for him but she said she prefers to keep him at home.  Regarding PCS, she said that the aide had COVID and was out for a week.  I instructed her to contact the agency that the aide worked for and request a replacement. She said she already did that. Then I instructed her to call [name] and request another agency. She said she has tried that but will call [place] again.

## 2021-12-09 ENCOUNTER — Ambulatory Visit (INDEPENDENT_AMBULATORY_CARE_PROVIDER_SITE_OTHER): Payer: Medicaid Other | Admitting: Primary Care

## 2021-12-09 ENCOUNTER — Other Ambulatory Visit: Payer: Self-pay

## 2021-12-09 DIAGNOSIS — R159 Full incontinence of feces: Secondary | ICD-10-CM

## 2021-12-09 DIAGNOSIS — R32 Unspecified urinary incontinence: Secondary | ICD-10-CM

## 2021-12-09 MED ORDER — LINACLOTIDE 72 MCG PO CAPS
72.0000 ug | ORAL_CAPSULE | Freq: Every day | ORAL | 3 refills | Status: AC
  Filled 2021-12-09: qty 30, 30d supply, fill #0

## 2021-12-09 NOTE — Progress Notes (Signed)
[facility]  Virtual Visit via Telephone Note  I connected with Brent Young, on 12/02/2021 at 2:44 PM  by telephone and verified that I am speaking with the correct person using two identifiers.   Consent: I discussed the limitations, risks, security and privacy concerns of performing an evaluation and management service by telephone and the availability of in person appointments. I also discussed with the patient that there may be a patient responsible charge related to this service. The patient expressed understanding and agreed to proceed.   Location of Patient: Home  Location of Provider: [facility]   Persons participating in Telemedicine visit: Pervis Hocking,  NP  History of Present Illness: Brent Young is a 61 year old male.  Having a a telemetry visit due to transportation mixup. past he has a medical history of Allergy, Arthritis, Diabetes mellitus without complication (HCC), Hypertension, and Stroke (HCC).  Patient continues to have problems with left leg numbness and weakness.  Has a history of falls currently smokes.  Does not. 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  He is unable to perform ADLs without assistance.  He is also incontinent of bowel and bladder .  He is also unable to do IADLs and his sister helps with his care.  He does have some assistance at the home PCS.  He previously participated in  PT/OT/ST for 3 hours 5 days a week. Sister was on the call with him and was given verbal permission by patient. Past Medical History:  Diagnosis Date   Allergy    Arthritis    Diabetes mellitus without complication (HCC)    Hypertension    Stroke [facility])    Encounter Diagnoses  Name Primary?   Incontinence of feces, unspecified fecal incontinence type Yes   Urinary incontinence, unspecified type     No Known  Allergies  Current Outpatient Medications on File Prior to Visit  Medication Sig Dispense Refill   acetaminophen (TYLENOL) 500 MG tablet Take 1,000 mg by mouth in the morning, at noon, and at bedtime.     acetaminophen (TYLENOL) 650 MG suppository Place 1 suppository (650 mg total) rectally every 4 (four) hours as needed for mild pain (or temp > 37.5 C (99.5 F)). (Patient not taking: Reported on 11/04/2021) 12 suppository 0   albuterol (PROVENTIL) (2.5 MG/3ML) 0.083% nebulizer solution Take 3 mLs (2.5 mg total) by nebulization every 6 (six) hours as needed for wheezing or shortness of breath. 90 mL 12   amLODipine (NORVASC) 10 MG tablet Take 1 tablet (10 mg total) by mouth daily. 90 tablet 1   aspirin EC 81 MG tablet Take 1 tablet (81 mg total) by mouth daily. Swallow whole. 21 tablet 0   atorvastatin (LIPITOR) 40 MG tablet Take 1 tablet (40 mg total) by mouth daily. 60 tablet 1   clopidogrel (PLAVIX) 75 MG tablet Take 1 tablet (75 mg total) by mouth daily. 30 tablet 11   diclofenac Sodium (VOLTAREN) 1 % GEL Apply 2 g topically 4 (four) times daily. 100 g 0   docusate sodium (COLACE) 100 MG capsule Take 1 capsule (100 mg total) by mouth daily as needed for mild constipation or moderate constipation. 10 capsule 0   fluticasone (FLONASE) 50 MCG/ACT nasal spray Place 1 spray into both nostrils daily. 16 g 2   gabapentin (NEURONTIN) 300 MG capsule Take 1 capsule (300 mg total) by  mouth 3 (three) times daily. 90 capsule 1   hydrALAZINE (APRESOLINE) 10 MG tablet Take 1 tablet (10 mg total) by mouth as needed (sBP>160 or dBP>100). 30 tablet 0   hydrOXYzine (ATARAX) 10 MG tablet Take 10 mg by mouth 3 (three) times daily as needed.     irbesartan (AVAPRO) 75 MG tablet Take 75 mg by mouth daily.     lidocaine (LMX) 4 % cream Apply 1 Application topically as needed (pain). Apply to arms 30 g 0   linagliptin (TRADJENTA) 5 MG TABS tablet Take 1 tablet (5 mg total) by mouth daily. 30 tablet 0    losartan-hydrochlorothiazide (HYZAAR) 50-12.5 MG tablet Take 1 tablet by mouth daily. 30 tablet 0   melatonin 3 MG TABS tablet Take 1 tablet (3 mg total) by mouth at bedtime. 30 tablet 0   metFORMIN (GLUCOPHAGE) 1000 MG tablet Take 1 tablet (1,000 mg total) by mouth 2 (two) times daily with a meal. 180 tablet 3   metoprolol succinate (TOPROL-XL) 50 MG 24 hr tablet Take 1 tablet (50 mg total) by mouth daily. Take with or immediately following a meal. 30 tablet 0   mometasone-formoterol (DULERA) 100-5 MCG/ACT AERO Inhale 2 puffs into the lungs 2 (two) times daily. 13 g 0   Oxcarbazepine (TRILEPTAL) 300 MG tablet Take 1 tablet (300 mg total) by mouth 3 (three) times daily. 270 tablet 1   pantoprazole (PROTONIX) 40 MG tablet Take 1 tablet (40 mg total) by mouth daily. 30 tablet 1   polyethylene glycol powder (GLYCOLAX/MIRALAX) 17 GM/SCOOP powder Take 17 g by mouth daily as needed for moderate constipation or severe constipation. 238 g 0   traZODone (DESYREL) 50 MG tablet Take 1 tablet (50 mg total) by mouth at bedtime. 30 tablet 0   No current facility-administered medications on file prior to visit.    Observations/Objective: There were no vitals taken for this visit.   Assessment and Plan: Diagnoses and all orders for this visit:  Incontinence of feces, unspecified fecal incontinence type Secondary to CVA  Urinary incontinence, unspecified type Will have [place] fax office note and demographics to [facility]     Follow Up Instructions: As needed    I discussed the assessment and treatment plan with the patient. The patient was provided an opportunity to ask questions and all were answered. The patient agreed with the plan and demonstrated an understanding of the instructions.   The patient was advised to call back or seek an in-person evaluation if the symptoms worsen or if the condition fails to improve as anticipated.     I provided 20 minutes total of non-face-to-face time during  this encounter including median intraservice time, reviewing previous notes, investigations, ordering medications, medical decision making, coordinating care and patient verbalized understanding at the end of the visit.    This note has been created with Surveyor, quantity. Any transcriptional errors are unintentional.   Kerin Perna, NP 12/02/2021, 2:44 PM

## 2021-12-09 NOTE — Telephone Encounter (Signed)
Had to add pt to the schedule again did not charge copay. Sharyn Lull was able to do note and will route everything to [facility] Attn: Annie Main for incontinence supplies

## 2021-12-11 NOTE — Progress Notes (Unsigned)
[name] [address] [place]. [place] [address] [phone]       HOSPITAL FOLLOW UP NOTE  [name] Date of Birth:  06-14-1960 Medical Record Number:  [id number]   Reason for Referral:  hospital stroke follow up    SUBJECTIVE:   CHIEF COMPLAINT:  Chief Complaint  Patient presents with   Follow-up    Pt in room #2 with son and sister. Pt here today for an f/u.    HPI:   Brent Young is a 61 y.o. who  has a past medical history of Allergy, Arthritis, Diabetes mellitus without complication ([place]), Hypertension, and Stroke ([place]).  Patient presented on 02/16/2021 with left leg numbness and weakness. He woke up that morning an was unable to feel leg. He fell to the floor when he tried to get out of bed. Brent Young helped him get back to bed and left for work. Later, he tried to get back up and fell again. He was unable to get up and laid in the floor most of the day. When family returned home they called EMS and he was transported to the ER. 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 Cerebral angiogram completed showed left ICA 65 to 70% stenosis, bilateral ICA siphon mild to moderate stenosis right VA stenosis. tPA not administered. He was started on asa 325mg  and Plavix 75mg  daily for three months then advised to continue asa alone. He was [facility] to [facility]/27/20[facility]. He participated in PT/OT/ST for 3 hours 5 days a week. He was discharged from [facility] 04/03/2021. He is now residing at [facility]. He continues PT/OT/ST. He feels that left arm weakness is improving. He continues to have left lower weakness. Sensation is normal. He has walked a few steps with therapy but mostly wheelchair bound. He plans to return home with his son asap. He was advised to follow up out patient with pulmonology for sleep study due to hypercarbia/hypoxia requiring O2 therapy. Likely OSA. He reports supplemental O2 needed with PT but otherwise  he does not use. He denies difficulty breathing. He denies smoking.   Personally reviewed hospitalization pertinent progress notes, lab work and imaging.  Evaluated by Dr. Erlinda Young.   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 He was in [facility] until 10/31/2021 when he was seen by ER for an unwitnessed fall. Wheelchair bound at baseline. Workup showed several acute and subacute infarcts involving multiple vascular territories. Imaging concerning for possible demyelination. Cervical spine MRI attempted with sedation x 2 but patient was unable to complete. Imaging cancelled as MS diagnosis would not change care plan due to debilitated condition. He was on asa 325mg  and started on Plavix for three weeks. Plan to continue Plavix only, thereafter. PT recommended SNF placement.   Sister reports that she was unaware he was in the hospital for three days. His sister asked to bring him home as she did not feel comfortable with him returning to the facility. He returned to her home 11/06/2021. Per hospital notes, He is not mobile. He is bed bound. He reports pain with sitting in wheelchair. He was seen by PCP about 2 weeks ago by virtual appt due to concerns with transportation. Per PCP, home health needs recorded and note sent to order RN, PT, OT and ST.    He describes a sharp/needle sensation in left shoulder. He is unable to lift left arm for any  extended period of time. No tenderness with palpation of shoulder.   He was given hospital bed, hoyer lift and wheelchair.   He does not smoke. They do not check BP routinely.   He continues Plavix and atorvastatin.   [facility] came out to see him for personal care services. She reports never seeing a PT/OT professional.   Brent Young [phone]  PERTINENT IMAGING/LABS  CT Head - No acute intracranial process. ASPECTS is 10. 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 Re-demonstrated moderate to severe calcified plaque in the carotid siphons, right greater than left, overall unchanged. 65-70% stenosis of the proximal left ICA MRI head - Acute to subacute perforator infarcts at the right basal ganglia and corona radiata. Extensive chronic small vessel ischemia. Cerebral angiogram left ICA 65 to 70% stenosis, bilateral ICA siphon mild to moderate stenosis, right VA severe stenosis. 2D Echo - EF 60 to 65%   A1C Lab Results  Component Value Date   HGBA1C 5.3 10/31/2021    Lipid Panel     Component Value Date/Time   CHOL 93 11/01/2021 0211   CHOL 141 10/08/2020 1140   TRIG 98 11/01/2021 0211   HDL 28 (L) 11/01/2021 0211   HDL 43 10/08/2020 1140   CHOLHDL 3.3 11/01/2021 0211   VLDL 20 11/01/2021 0211   LDLCALC 45 11/01/2021 0211   LDLCALC 75 10/08/2020 1140   LABVLDL 23 10/08/2020 1140    ROS:   14 system review of systems performed and negative with exception of those listed in HPI  PMH:  Past Medical History:  Diagnosis Date   Allergy    Arthritis    Diabetes mellitus without complication (HCC)    Hypertension    Stroke (HCC)     PSH:  Past Surgical History:  Procedure Laterality Date   FRACTURE SURGERY Left    Patient fractured left arm   IR ANGIO INTRA EXTRACRAN SEL COM CAROTID INNOMINATE UNI L MOD SED  02/19/2021   IR ANGIO INTRA EXTRACRAN SEL INTERNAL CAROTID UNI R MOD SED  02/19/2021   IR ANGIO VERTEBRAL SEL SUBCLAVIAN INNOMINATE UNI R MOD SED  02/19/2021   IR ANGIO VERTEBRAL SEL VERTEBRAL UNI L MOD SED  02/19/2021   IR US GUIDE VASC ACCESS RIGHT  02/19/2021    Social History:  Social History   Socioeconomic History   Marital status: Married    Spouse name: Not on file   Number of children: Not on file   Years of education: Not on file   Highest education level: Not on file  Occupational History   Not on file  Tobacco Use   Smoking  status: Some Days    Types: Cigarettes   Smokeless tobacco: Never  Vaping Use   Vaping Use: Never used  Substance and Sexual Activity   Alcohol use: Not Currently    Comment: occasionally   Drug use: Not Currently    Types: Marijuana   Sexual activity: Not on file  Other Topics Concern   Not on file  Social History Narrative   Not on file   Social Determinants of Health   Financial Resource Strain: Not on file  Food Insecurity: Not on file  Transportation Needs: Not on file  Physical Activity: Not on file  Stress: Not on file  Social Connections: Not on file  Intimate Partner Violence:  Not on file    Family History:  Family History  Problem Relation Age of Onset   Colon cancer Maternal Grandmother    Esophageal cancer Neg Hx    Stomach cancer Neg Hx    Rectal cancer Neg Hx     Medications:   Current Outpatient Medications on File Prior to Visit  Medication Sig Dispense Refill   acetaminophen (TYLENOL) 500 MG tablet Take 1,000 mg by mouth in the morning, at noon, and at bedtime.     acetaminophen (TYLENOL) 650 MG suppository Place 1 suppository (650 mg total) rectally every 4 (four) hours as needed for mild pain (or temp > 37.5 C (99.5 F)). 12 suppository 0   albuterol (PROVENTIL) (2.5 MG/3ML) 0.083% nebulizer solution Take 3 mLs (2.5 mg total) by nebulization every 6 (six) hours as needed for wheezing or shortness of breath. 90 mL 12   amLODipine (NORVASC) 10 MG tablet Take 1 tablet (10 mg total) by mouth daily. 90 tablet 1   aspirin EC 81 MG tablet Take 1 tablet (81 mg total) by mouth daily. Swallow whole. 21 tablet 0   atorvastatin (LIPITOR) 40 MG tablet Take 1 tablet (40 mg total) by mouth daily. 60 tablet 1   clopidogrel (PLAVIX) 75 MG tablet Take 1 tablet (75 mg total) by mouth daily. 30 tablet 11   diclofenac Sodium (VOLTAREN) 1 % GEL Apply 2 g topically 4 (four) times daily. 100 g 0   docusate sodium (COLACE) 100 MG capsule Take 1 capsule (100 mg total) by mouth  daily as needed for mild constipation or moderate constipation. 10 capsule 0   fluticasone (FLONASE) 50 MCG/ACT nasal spray Place 1 spray into both nostrils daily. 16 g 2   gabapentin (NEURONTIN) 300 MG capsule Take 1 capsule (300 mg total) by mouth 3 (three) times daily. 90 capsule 1   hydrOXYzine (ATARAX) 10 MG tablet Take 10 mg by mouth 3 (three) times daily as needed.     irbesartan (AVAPRO) 75 MG tablet Take 75 mg by mouth daily.     lidocaine (LMX) 4 % cream Apply 1 Application topically as needed (pain). Apply to arms 30 g 0   linaclotide (LINZESS) 72 MCG capsule Take 1 capsule (72 mcg total) by mouth daily before breakfast. 30 capsule 3   losartan-hydrochlorothiazide (HYZAAR) 50-12.5 MG tablet Take 1 tablet by mouth daily. 30 tablet 0   melatonin 3 MG TABS tablet Take 1 tablet (3 mg total) by mouth at bedtime. 30 tablet 0   metFORMIN (GLUCOPHAGE) 1000 MG tablet Take 1 tablet (1,000 mg total) by mouth 2 (two) times daily with a meal. 180 tablet 3   metoprolol succinate (TOPROL-XL) 50 MG 24 hr tablet Take 1 tablet (50 mg total) by mouth daily. Take with or immediately following a meal. 30 tablet 0   mometasone-formoterol (DULERA) 100-5 MCG/ACT AERO Inhale 2 puffs into the lungs 2 (two) times daily. 13 g 0   Oxcarbazepine (TRILEPTAL) 300 MG tablet Take 1 tablet (300 mg total) by mouth 3 (three) times daily. 270 tablet 1   pantoprazole (PROTONIX) 40 MG tablet Take 1 tablet (40 mg total) by mouth daily. 30 tablet 1   polyethylene glycol powder (GLYCOLAX/MIRALAX) 17 GM/SCOOP powder Take 17 g by mouth daily as needed for moderate constipation or severe constipation. 238 g 0   traZODone (DESYREL) 50 MG tablet Take 1 tablet (50 mg total) by mouth at bedtime. 30 tablet 0   hydrALAZINE (APRESOLINE) 10 MG tablet Take 1 tablet (  10 mg total) by mouth as needed (sBP>160 or dBP>100). 30 tablet 0   linagliptin (TRADJENTA) 5 MG TABS tablet Take 1 tablet (5 mg total) by mouth daily. 30 tablet 0   No current  facility-administered medications on file prior to visit.    Allergies:  No Known Allergies    OBJECTIVE:  Physical Exam  Vitals:   12/16/21 1029  BP: (!) 140/93  Pulse: 98  Weight: 252 lb 4.8 oz (114.4 kg)  Height: 6' (1.829 m)    Body mass index is 34.22 kg/m. No results found.     10/08/2020   10:52 AM  Depression screen PHQ 2/9  Decreased Interest 1  Down, Depressed, Hopeless 1  PHQ - 2 Score 2  Altered sleeping 1  Tired, decreased energy 1  Change in appetite 0  Feeling bad or failure about yourself  0  Trouble concentrating 0  Moving slowly or fidgety/restless 0  Suicidal thoughts 0  PHQ-9 Score 4  Difficult doing work/chores Not difficult at all     General: well developed, well nourished, seated, in no evident distress Head: head normocephalic and atraumatic.   Neck: supple with no carotid or supraclavicular bruits Cardiovascular: regular rate and rhythm, no murmurs Musculoskeletal: no deformity Skin:  no rash/petichiae Vascular:  Normal pulses all extremities   Neurologic Exam Mental Status: Awake and fully alert. very mild dysarthria. Oriented to place and time. Recent and remote memory intact. Attention span, concentration and fund of knowledge appropriate. Mood and affect appropriate.  Cranial Nerves: Fundoscopic exam reveals sharp disc margins. Pupils equal, briskly reactive to light. Extraocular movements full without nystagmus. Visual fields full to confrontation. Hearing intact. Facial sensation intact. Face, tongue, palate moves normally and symmetrically.  Motor: Normal bulk and tone. Normal strength in right upper and lower extremities, left upper 4/5, left lower hip flexion 2/5, left lower flexion and extension 3/5.  Sensory.: intact to touch Coordination: Finger-to-nose and heel-to-shin reduced in left upper, unable to perform left lower Gait and Station: wheelchair bound  Reflexes: 1+ and symmetric.    NIHSS  5 Modified Rankin   4    ASSESSMENT: Brent Young is a 60 y.o. year old male admitted at [facility] for slurred speech, right facial droop, and fall. Vascular risk factors include HTN, HLD, DMT2 uncontrolled, previous CVA, previous smoker.   PLAN:  Acute to subacute right BG and CR infarcts secondary to small versus large vessel atherosclerosis in the setting of multiple uncontrolled risk factors : Residual deficit: left sided weakness, lower>upper extremity. Continue aspirin 325 mg daily  and atorvastatin  for secondary stroke prevention.  Discussed secondary stroke prevention measures and importance of close PCP follow up for aggressive stroke risk factor management. I have gone over the pathophysiology of stroke, warning signs and symptoms, risk factors and their management in some detail with instructions to go to the closest emergency room for symptoms of concern. HTN: BP goal <130/90.  Elevated in the office today. Advised close monitoring at home. Contact PCP if readings are greater than 140/90 consistently. Continue irbesartan, metoprolol and amlodipine as directed by PCP.  HLD: LDL goal <70. Recent LDL 98. Continue atorvastatin 40mg  daily per PCP.  DMII: A1c goal<7.0. Recent A1c 9.9. Please continue close follow up with PCP for CBG management.  Obesity: Healthy lifestyle habits with low carb diet and regular exercise.  Seizure prophylaxis: continue oxcarbazepine 300mg  TID per PCP.  Hypercapnia/hypoxia in hospital: referred to pulmonology. Needs sleep eval.  [place] to assist with scheduling appt. Discussed possibility of performing sleep eval with [facility] if no pulmonary needs, however, based on PT notes, he requires supplemental Os with ambulation.    Follow up in 4-6 months or call earlier if needed   CC:  [facility] provider: Dr. Pearlean Brownie PCP: Grayce Sessions, NP    I spent 45 minutes of face-to-face and non-face-to-face time with patient.  This included previsit chart review including  review of recent hospitalization, lab review, study review, order entry, electronic health record documentation, patient education regarding recent stroke including etiology, secondary stroke prevention measures and importance of managing stroke risk factors, residual deficits and typical recovery time and answered all other questions to patient satisfaction   Shawnie Dapper, [facility]  [facility] [address] [place], [place] [address]  Phone [phone] Fax [phone] Note: This document was prepared with digital dictation and possible smart phrase technology. Any transcriptional errors that result from this process are unintentional.

## 2021-12-11 NOTE — Patient Instructions (Addendum)
Below is our plan:  We will continue stroke prevention and co morbidity management wit PCP. PCP has referred you to home health services, however, it may be difficult to find services quickly. I have placed a referral to social work to see if there are any community resources available to assistance at home. I would encourage you to reconsider placement in skilled nursing facility as he needs consistent care. He should continue Plavix and atorvastatin through PCP. Continue to monitor left shoulder pain. May consider further workup if not improving with PT.   Please make sure you are staying well hydrated. I recommend 50-60 ounces daily. Well balanced diet and regular exercise encouraged. Consistent sleep schedule with 6-8 hours recommended.   Please continue follow up with care team as directed.   Follow up in 4 months   You may receive a survey regarding today's visit. I encourage you to leave honest feed back as I do use this information to improve patient care. Thank you for seeing me today!

## 2021-12-15 ENCOUNTER — Telehealth (INDEPENDENT_AMBULATORY_CARE_PROVIDER_SITE_OTHER): Payer: Self-pay | Admitting: Primary Care

## 2021-12-15 NOTE — Telephone Encounter (Signed)
Copied from [place] [id number]. Topic: General - Other >> Dec 15, 2021 10:58 AM Charlotte Sanes J wrote: Reason for CRM: [facility] calling about order they received for a bariatric bed for the pt / but they are getting kick back from medicaid that the pt is in a living facility / they called to confirm this info / please advise / they had called pt but not able to reach pt at any number

## 2021-12-15 NOTE — Telephone Encounter (Signed)
Returned call to [facility] and spoke to [place]. Made her aware that we didn't send a rx for a bariartic bed.

## 2021-12-15 NOTE — Telephone Encounter (Signed)
Copied from [place] [id number]. Topic: General - Other >> Dec 12, 2021  4:00 PM Ludger Nutting wrote: Brent Young with [facility] call and said that patient is not eligible for the bariatric commode with his current weight, unless supporting documentation like wide hip width could be provided. Please advise.

## 2021-12-15 NOTE — Telephone Encounter (Signed)
Will forward to provider  

## 2021-12-16 ENCOUNTER — Ambulatory Visit: Payer: Medicaid Other | Admitting: Family Medicine

## 2021-12-16 ENCOUNTER — Other Ambulatory Visit: Payer: Self-pay

## 2021-12-16 ENCOUNTER — Encounter: Payer: Self-pay | Admitting: Family Medicine

## 2021-12-16 VITALS — BP 140/93 | HR 98 | Ht 72.0 in | Wt 252.3 lb

## 2021-12-16 DIAGNOSIS — R531 Weakness: Secondary | ICD-10-CM

## 2021-12-16 DIAGNOSIS — E669 Obesity, unspecified: Secondary | ICD-10-CM

## 2021-12-16 DIAGNOSIS — I10 Essential (primary) hypertension: Secondary | ICD-10-CM | POA: Diagnosis not present

## 2021-12-16 DIAGNOSIS — I639 Cerebral infarction, unspecified: Secondary | ICD-10-CM | POA: Diagnosis not present

## 2021-12-16 DIAGNOSIS — E1169 Type 2 diabetes mellitus with other specified complication: Secondary | ICD-10-CM | POA: Diagnosis not present

## 2021-12-16 DIAGNOSIS — E785 Hyperlipidemia, unspecified: Secondary | ICD-10-CM

## 2021-12-17 ENCOUNTER — Telehealth: Payer: Self-pay | Admitting: Family Medicine

## 2021-12-17 ENCOUNTER — Encounter: Payer: Self-pay | Admitting: Family Medicine

## 2021-12-17 NOTE — Telephone Encounter (Signed)
Called the patient's sister Juliann Pulse. There was no answer. LVM advising her to call back to discuss the patient's care

## 2021-12-17 NOTE — Telephone Encounter (Signed)
Referral for social work sent to [facility] [employer]. Phone: [phone], Fax: [phone]

## 2021-12-17 NOTE — Telephone Encounter (Signed)
Please reach out to his sister, Juliann Pulse. Number in my office note. Let her know that I have tried to contact his PCP but have not yet heard back. Remind her to stay in touch regarding the home health referral. Social work had told them that due to his insurance payer, he would not be able to receive home health services, only DME equipment. I have placed a referral to social work to see if there is any assistance they can provide for community resources for personal care services. I will again encourage them to consider placement in SNF. They were offered return to [place] and a bed at [facility] while he was in the hospital. [facility] denied placement. She can work with his PCP for assistance with SNF placement if she wishes.

## 2021-12-17 NOTE — Telephone Encounter (Signed)
Sister called back and I was able to review this information that Amy advised. She was appreciative for the call and will continue to work on this. She will let us know if anything is needed from [place].

## 2021-12-18 ENCOUNTER — Other Ambulatory Visit (INDEPENDENT_AMBULATORY_CARE_PROVIDER_SITE_OTHER): Payer: Self-pay | Admitting: Primary Care

## 2021-12-18 DIAGNOSIS — I63231 Cerebral infarction due to unspecified occlusion or stenosis of right carotid arteries: Secondary | ICD-10-CM

## 2021-12-18 NOTE — Telephone Encounter (Signed)
I spoke to patient's sister,Brent Young, regarding the home health referral.  I explained to her that I just received it and will start looking for home health agencies but there is no guarantee that an agency will accept the referral.  They need to accept his insurance and have staffing available.  She said she understood. She explained the difficulties with caring for him as they are not able to get him out of bed and she fears his skin is breaking down.   I inquired about the [facility] aide and she said that they don't have anyone coming. She said that the aide they had got COVID and has never come back and the agency said they don't have anyone else to fill the case.  I told her that it is very important that she contact [name] and request a new agency and let them know that the agency she chose does not have staff available for her brother.   I gave her 2 numbers for [facility] and she said she will call.   We discussed the option of skilled nursing care and inpatient rehab. This was the discharge plan from the hospital but she said that she wanted to bring him home because the only facility that would accept him was [name] and they did not have a good experience with [name] in the past.  She is now willing to look at other facilities but will not consider [name].  I explained to her that she can call the nursing facilities that she would consider and explain his needs and inquire if they have any male beds available.  If she finds a facility that may accept him we can send them the clinical information they need.  She also understands that there is no guarantee that a facility will accept him.   This information is  from the hospital SW notes: CSW discussed barriers with taking patient home (he won't get home health services, just DME, and caregivers through [place] can take a few weeks to be approved/arranged). Brent Young indicated understanding and that she would take him home.     The home  health referral was faxed to [place] for review.  Brent Young/ [facility] was also sent information to review the referral.

## 2021-12-18 NOTE — Telephone Encounter (Signed)
Patient's sister, Juliann Pulse called to get an update on patient's care plan for home health. Please advise.

## 2021-12-22 ENCOUNTER — Telehealth (INDEPENDENT_AMBULATORY_CARE_PROVIDER_SITE_OTHER): Payer: Self-pay | Admitting: Primary Care

## 2021-12-22 NOTE — Telephone Encounter (Signed)
Pts son is calling back but states that Juliann Pulse his aunt was trying to get in touch with [facility]. [phone]

## 2021-12-22 NOTE — Telephone Encounter (Signed)
I called [facility] and spoke to [place] who said they will review the referral.  If approved, Medicaid will usually only authorize 3 visits for PT after the evaluation visit. Referral was then faxed for review to [phone]     I spoke to [facility] and faxed referral for review to [phone].   I also contacted the following agencies and they declined the referral:  Brianna/ [facility],  Karen/ [facility]  Karen/ [facility]   I then spoke to patient's sister, Brent Young and explained the status of the home health referral - 5 agencies have declined and I am waiting for 2 agencies to review the request.  Brent Young said she is overwhelmed and her brother is declining. She said she called DSS about his medicaid and was told that since he is no longer in a long term care facility, they are cancelling his Medicaid. She was not sure of the next step.  I explained that now he back in the community and he could be eligible for regular Medicaid.  She was not sure of that and only said that she was told everything is being cancelled.  She was hesitant to call DSS again.  I provided her with the phone number for the [facility] [phone]  for guidance/ advocacy related to long term care services and Medicaid. She said she would call tomorrow and then provide me with an update.

## 2021-12-22 NOTE — Telephone Encounter (Signed)
Message received from [facility] stating that they are not able to accept the referral due to staffing.   Message also received from [facility] stating they are not able to accept the referral either

## 2021-12-23 NOTE — Telephone Encounter (Signed)
I spoke to patient's sister, Brent Young this afternoon.  She explained that she left a message for [employer] and is waiting for a call back    She said she contacted [name] again and was told that she needs to find a provider for [facility] services, they are not able to give her names of any agencies. I explained to her that they are not able to choose an agency but they have always provided patients with options of agencies to choose from that are in their network. I told her I would contact [name].  She again verbalized frustration with trying to get help for her brother. She would like him in a facility to get therapy but does not want him in a long term care facility where he would just lay in bed. She said if all he is going to do is lay in bed and not receive therapy, he can lay in bed at home and she and his son are there with him. She has contacted some facilities but said they have not guaranteed  therapy for him.  I explained that I have now contacted 10 home health agencies and 9 have declined the referral. Only 1 is still reviewing.   She said that DSS told her that his long term Medicaid will be terminated and he will need to re-apply for community Medicaid.    We discussed making a referral to [facility]PS to inquire about assistance with placement due to her issues with Medicaid and availability of home health services She is not against getting help, she just does not want him placed in a facility where he will not get therapy.

## 2021-12-23 NOTE — Telephone Encounter (Signed)
Call received from [facility] stating they are declining the referral

## 2021-12-23 NOTE — Telephone Encounter (Signed)
I spoke to [place]  and she declined the referral.  I also called [facility] and spoke to [place] who also declined the referral.  I called [facility], spoke to Intake and she requested referral be faxed to [phone] for review. Referral then faxed as requested.   I spoke to [place] and she said they did not receive the referral that was faxed yesterday.  Fax number confirmed and referral refaxed

## 2021-12-23 NOTE — Telephone Encounter (Signed)
Call received from [facility] who stated that she found the referral and they are not accepting it due to staffing

## 2021-12-24 ENCOUNTER — Telehealth: Payer: Self-pay | Admitting: Emergency Medicine

## 2021-12-24 NOTE — Telephone Encounter (Signed)
[name] is now managing PCS program for the state.  I spoke to Marti/ customer service with [facility] [phone] regarding patient's need for a new service provider.  Inez Catalina stated that the patient's sister could call them. She also emailed  me a listing of PCS providers but the listing is 41 pages long and does not indicate what providers service [facility]. I sent a message back to [place] inquiring if there is something more user friendly for patient's sister.

## 2021-12-24 NOTE — Telephone Encounter (Signed)
Copied from [place] [id number]. Topic: General - Other >> Dec 24, 2021  8:44 AM Leilani Able wrote: Reason for CRM:  For Evern Bio from  [facility] states they can not accept this pt for home health. ??? Call @ [phone]

## 2022-01-01 NOTE — Telephone Encounter (Signed)
Call placed to patient's sister, Juliann Pulse [phone], to inquire how he is doing and how she is managing his care. Message left with call back requested.

## 2022-01-12 ENCOUNTER — Telehealth (INDEPENDENT_AMBULATORY_CARE_PROVIDER_SITE_OTHER): Payer: Self-pay | Admitting: Primary Care

## 2022-01-12 NOTE — Telephone Encounter (Signed)
Will forward to provider  

## 2022-01-12 NOTE — Telephone Encounter (Signed)
[employer] called in for providers assistance. Mr Brent Young said that pt died on 2022-01-25 and is being cremated. He is seeking to have provider sign off on death certificate in the system.   Milas Kocher [phone]   Cell: [phone]

## 2022-01-13 ENCOUNTER — Telehealth (INDEPENDENT_AMBULATORY_CARE_PROVIDER_SITE_OTHER): Payer: Self-pay | Admitting: Primary Care

## 2022-01-13 NOTE — Telephone Encounter (Signed)
Copied from [place] [id number]. Topic: General - Other >> Jan 13, 2022  2:34 PM Everette C wrote: Reason for CRM: Mr Minta Balsam of [place] has called to request completion of the patient's death certificate   NCDAVES ID # [id number]  Please contact further if needed

## 2022-01-21 DEATH — deceased

## 2022-04-29 ENCOUNTER — Encounter: Payer: Self-pay | Admitting: Family Medicine

## 2022-04-29 ENCOUNTER — Ambulatory Visit: Payer: Self-pay | Admitting: Family Medicine

## 2022-04-29 NOTE — Progress Notes (Deleted)
[name] [address] [place]. [place] [address] [phone]       HOSPITAL FOLLOW UP NOTE  [name] Date of Birth:  Nov 30, 1960 Medical Record Number:  [id number]   Reason for Referral:  hospital stroke follow up    SUBJECTIVE:   CHIEF COMPLAINT:  No chief complaint on file.   HPI:   Brent Young is a 62 y.o. who  has a past medical history of Allergy, Arthritis, Diabetes mellitus without complication ([place]), Hypertension, and Stroke ([place]).  Patient presented on 02/16/2021 with left leg numbness and weakness. He woke up that morning an was unable to feel leg. He fell to the floor when he tried to get out of bed. Margorie John helped him get back to bed and left for work. Later, he tried to get back up and fell again. He was unable to get up and laid in the floor most of the day. When family returned home they called EMS and he was transported to the ER. 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 Cerebral angiogram completed showed left ICA 65 to 70% stenosis, bilateral ICA siphon mild to moderate stenosis right VA stenosis. tPA not administered. He was started on asa 325mg  and Plavix 75mg  daily for three months then advised to continue asa alone. He was admitted to [facility] 02/27/2021. He participated in PT/OT/ST for 3 hours 5 days a week. He was discharged from [facility] 04/03/2021. He is now residing at [facility]. He continues PT/OT/ST. He feels that left arm weakness is improving. He continues to have left lower weakness. Sensation is normal. He has walked a few steps with therapy but mostly wheelchair bound. He plans to return home with his son asap. He was advised to follow up out patient with pulmonology for sleep study due to hypercarbia/hypoxia requiring O2 therapy. Likely OSA. He reports supplemental O2 needed with PT but otherwise he does not use. He denies difficulty breathing. He denies smoking.   Personally reviewed  hospitalization pertinent progress notes, lab work and imaging.  Evaluated by Dr. Erlinda Hong.   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 He was in [facility] until 10/31/2021 when he was seen by ER for an unwitnessed fall. Wheelchair bound at baseline. Workup showed several acute and subacute infarcts involving multiple vascular territories. Imaging concerning for possible demyelination. Cervical spine MRI attempted with sedation x 2 but patient was unable to complete. Imaging cancelled as MS diagnosis would not change care plan due to debilitated condition. He was on asa 325mg  and started on Plavix for three weeks. Plan to continue Plavix only, thereafter. PT recommended SNF placement.   Sister reports that she was unaware he was in the hospital for three days. His sister asked to bring him home as she did not feel comfortable with him returning to the facility. Bed found at [facility] but was also refused by patient and family. Family requested [facility] but facility refused. SW discussed concerns with Brent Young regarding returning home. She explained that he would not be able to get home health services, only DME equipment. He returned to her home 11/06/2021. Per hospital notes, He is not mobile. He is bed bound. He reports pain with sitting in wheelchair. He was seen by PCP about 2 weeks ago by virtual appt due to concerns with transportation. Per PCP, home health needs recorded and note sent to order RN, PT, OT and  ST.    He is complaining of left shoulder pain. He describes a sharp/needle sensation in left shoulder. He is unable to lift left arm for any extended period of time. No tenderness with palpation of shoulder. He has been taking gabapentin for neuropathy but not sure it helps.   He was given hospital bed, hoyer lift and wheelchair through [facility]. [place] came out to see him for personal care services. She reports never seeing a PT/OT professional.  PCS care discontinued about 2 weeks ago due to lack of staffing.    He does not smoke. They do not check BP routinely.   He continues Plavix and atorvastatin.   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    PERTINENT IMAGING/LABS  CT Head - No acute intracranial process. ASPECTS is 10. 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 Re-demonstrated moderate to severe calcified plaque in the carotid siphons, right greater than left, overall unchanged. 65-70% stenosis of the proximal left ICA MRI head - Acute to subacute perforator infarcts at the right basal ganglia and corona radiata. Extensive chronic small vessel ischemia. Cerebral angiogram left ICA 65 to 70% stenosis, bilateral ICA siphon mild to moderate stenosis, right VA severe stenosis. 2D Echo - EF 60 to 65%   A1C Lab Results  Component Value Date   HGBA1C 5.3 10/31/2021    Lipid Panel     Component Value Date/Time   CHOL 93 11/01/2021 0211   CHOL 141 10/08/2020 1140   TRIG 98 11/01/2021 0211   HDL 28 (L) 11/01/2021 0211   HDL 43 10/08/2020 1140   CHOLHDL 3.3 11/01/2021 0211   VLDL 20 11/01/2021 0211   LDLCALC 45 11/01/2021 0211   [place] 75 10/08/2020 1140   LABVLDL 23 10/08/2020 1140    ROS:   14 system review of systems performed and negative with exception of those listed in HPI  PMH:  Past Medical History:  Diagnosis Date   Allergy    Arthritis    Diabetes mellitus without complication ([place])    Hypertension    Stroke ([place])     PSH:  Past Surgical History:  Procedure Laterality Date   FRACTURE SURGERY Left    Patient fractured left arm   IR ANGIO INTRA EXTRACRAN SEL COM CAROTID INNOMINATE UNI L MOD SED  02/19/2021   IR ANGIO INTRA EXTRACRAN SEL INTERNAL CAROTID UNI R MOD SED  02/19/2021   IR ANGIO  VERTEBRAL SEL SUBCLAVIAN INNOMINATE UNI R MOD SED  02/19/2021   IR ANGIO VERTEBRAL SEL VERTEBRAL UNI L MOD SED  02/19/2021   IR US GUIDE VASC ACCESS RIGHT  02/19/2021    Social History:  Social History   Socioeconomic History   Marital status: Married    Spouse name: Not on file   Number of children: Not on file   Years of education: Not on file   Highest education level: Not on file  Occupational History   Not on file  Tobacco Use   Smoking status: Some Days    Types: Cigarettes   Smokeless tobacco: Never  Vaping Use   Vaping Use: Never used  Substance and Sexual Activity   Alcohol use: Not Currently    Comment: occasionally   Drug use: Not Currently    Types: Marijuana  Sexual activity: Not on file  Other Topics Concern   Not on file  Social History Narrative   Not on file   Social Determinants of Health   Financial Resource Strain: Not on file  Food Insecurity: Not on file  Transportation Needs: Not on file  Physical Activity: Not on file  Stress: Not on file  Social Connections: Not on file  Intimate Partner Violence: Not on file    Family History:  Family History  Problem Relation Age of Onset   Colon cancer Maternal Grandmother    Esophageal cancer Neg Hx    Stomach cancer Neg Hx    Rectal cancer Neg Hx     Medications:   Current Outpatient Medications on File Prior to Visit  Medication Sig Dispense Refill   acetaminophen (TYLENOL) 500 MG tablet Take 1,000 mg by mouth in the morning, at noon, and at bedtime.     acetaminophen (TYLENOL) 650 MG suppository Place 1 suppository (650 mg total) rectally every 4 (four) hours as needed for mild pain (or temp > 37.5 C (99.5 F)). 12 suppository 0   albuterol (PROVENTIL) (2.5 MG/3ML) 0.083% nebulizer solution Take 3 mLs (2.5 mg total) by nebulization every 6 (six) hours as needed for wheezing or shortness of breath. 90 mL 12   amLODipine (NORVASC) 10 MG tablet Take 1 tablet (10 mg total) by mouth daily. 90  tablet 1   aspirin EC 81 MG tablet Take 1 tablet (81 mg total) by mouth daily. Swallow whole. 21 tablet 0   atorvastatin (LIPITOR) 40 MG tablet Take 1 tablet (40 mg total) by mouth daily. 60 tablet 1   clopidogrel (PLAVIX) 75 MG tablet Take 1 tablet (75 mg total) by mouth daily. 30 tablet 11   diclofenac Sodium (VOLTAREN) 1 % GEL Apply 2 g topically 4 (four) times daily. 100 g 0   docusate sodium (COLACE) 100 MG capsule Take 1 capsule (100 mg total) by mouth daily as needed for mild constipation or moderate constipation. 10 capsule 0   fluticasone (FLONASE) 50 MCG/ACT nasal spray Place 1 spray into both nostrils daily. 16 g 2   gabapentin (NEURONTIN) 300 MG capsule Take 1 capsule (300 mg total) by mouth 3 (three) times daily. 90 capsule 1   hydrALAZINE (APRESOLINE) 10 MG tablet Take 1 tablet (10 mg total) by mouth as needed (sBP>160 or dBP>100). 30 tablet 0   hydrOXYzine (ATARAX) 10 MG tablet Take 10 mg by mouth 3 (three) times daily as needed.     irbesartan (AVAPRO) 75 MG tablet Take 75 mg by mouth daily.     lidocaine (LMX) 4 % cream Apply 1 Application topically as needed (pain). Apply to arms 30 g 0   linaclotide (LINZESS) 72 MCG capsule Take 1 capsule (72 mcg total) by mouth daily before breakfast. 30 capsule 3   linagliptin (TRADJENTA) 5 MG TABS tablet Take 1 tablet (5 mg total) by mouth daily. 30 tablet 0   losartan-hydrochlorothiazide (HYZAAR) 50-12.5 MG tablet Take 1 tablet by mouth daily. 30 tablet 0   melatonin 3 MG TABS tablet Take 1 tablet (3 mg total) by mouth at bedtime. 30 tablet 0   metFORMIN (GLUCOPHAGE) 1000 MG tablet Take 1 tablet (1,000 mg total) by mouth 2 (two) times daily with a meal. 180 tablet 3   metoprolol succinate (TOPROL-XL) 50 MG 24 hr tablet Take 1 tablet (50 mg total) by mouth daily. Take with or immediately following a meal. 30 tablet 0   mometasone-formoterol (DULERA)  100-5 MCG/ACT AERO Inhale 2 puffs into the lungs 2 (two) times daily. 13 g 0   Oxcarbazepine  (TRILEPTAL) 300 MG tablet Take 1 tablet (300 mg total) by mouth 3 (three) times daily. 270 tablet 1   pantoprazole (PROTONIX) 40 MG tablet Take 1 tablet (40 mg total) by mouth daily. 30 tablet 1   polyethylene glycol powder (GLYCOLAX/MIRALAX) 17 GM/SCOOP powder Take 17 g by mouth daily as needed for moderate constipation or severe constipation. 238 g 0   traZODone (DESYREL) 50 MG tablet Take 1 tablet (50 mg total) by mouth at bedtime. 30 tablet 0   No current facility-administered medications on file prior to visit.    Allergies:  No Known Allergies    OBJECTIVE:  Physical Exam  There were no vitals filed for this visit.   There is no height or weight on file to calculate BMI. No results found.     10/08/2020   10:52 AM  Depression screen PHQ 2/9  Decreased Interest 1  Down, Depressed, Hopeless 1  PHQ - 2 Score 2  Altered sleeping 1  Tired, decreased energy 1  Change in appetite 0  Feeling bad or failure about yourself  0  Trouble concentrating 0  Moving slowly or fidgety/restless 0  Suicidal thoughts 0  PHQ-9 Score 4  Difficult doing work/chores Not difficult at all     General: well developed, well nourished, seated, in no evident distress Head: head normocephalic and atraumatic.   Neck: supple with no carotid or supraclavicular bruits Cardiovascular: regular rate and rhythm, no murmurs Musculoskeletal: no deformity Skin:  no rash/petichiae Vascular:  Normal pulses all extremities   Neurologic Exam Mental Status: Awake and fully alert. very mild dysarthria. Oriented to place and time. Recent and remote memory intact. Attention span, concentration and fund of knowledge appropriate. Mood and affect appropriate.  Cranial Nerves: Fundoscopic exam reveals sharp disc margins. Pupils equal, briskly reactive to light. Extraocular movements full without nystagmus. Visual fields full to confrontation. Hearing intact. Facial sensation intact. Face, tongue, palate moves  normally and symmetrically.  Motor: Normal bulk and tone. 4+/5 in right upper and 5/5 right lower extremity, left upper 3+/5, left lower hip flexion 2/5, left lower flexion and extension 2/5. Questionable effort.  Sensory.: intact to touch Coordination: unable to perform Gait and Station: unable to assess, on stretcher  Reflexes: 1+ and symmetric.     ASSESSMENT: Brent Young is a 62 y.o. year old male admitted at [facility] for slurred speech, right facial droop, and fall. Vascular risk factors include HTN, HLD, DMT2 uncontrolled, previous CVA, previous smoker.   PLAN:  Acute to subacute right BG and CR infarcts secondary to small versus large vessel atherosclerosis in the setting of multiple uncontrolled risk factors : Residual deficit: left sided weakness, lower>upper extremity. Continue Plavix and atorvastatin  for secondary stroke prevention.  Discussed secondary stroke prevention measures and importance of close PCP follow up for aggressive stroke risk factor management. I have gone over the pathophysiology of stroke, warning signs and symptoms, risk factors and their management in some detail with instructions to go to the closest emergency room for symptoms of concern. HTN: BP goal <130/90.  Elevated in the office today. Advised close monitoring at home. Contact PCP if readings are greater than 140/90 consistently. Continue irbesartan, metoprolol and amlodipine as directed by PCP.  HLD: LDL goal <70. Recent LDL 45. Continue atorvastatin 40mg  daily per PCP.  DMII: A1c goal<7.0. Recent A1c 5.3. Please continue  close follow up with PCP for CBG management.  Obesity: Healthy lifestyle habits with low carb diet and regular exercise.  Left shoulder pain: continue gabapentin. Needs PT (ordered by PCP) Seizure prophylaxis: continue oxcarbazepine 300mg  TID per PCP.  Care giver strain due to medical complexity and Immobility: PCP has placed home health referral. I will place social work  referral to assist family with community resources for personal care.    Follow up in 4 months or call earlier if needed   CC:  [facility] provider: Dr. Leonie Man PCP: Kerin Perna, NP    I spent 45 minutes of face-to-face and non-face-to-face time with patient.  This included previsit chart review including review of recent hospitalization, lab review, study review, order entry, electronic health record documentation, patient education regarding recent stroke including etiology, secondary stroke prevention measures and importance of managing stroke risk factors, residual deficits and typical recovery time and answered all other questions to patient satisfaction   Debbora Presto, [facility]  [facility] [address] [place], [place] [address]  Phone [phone] Fax [phone] Note: This document was prepared with digital dictation and possible smart phrase technology. Any transcriptional errors that result from this process are unintentional.

## 2022-05-29 IMAGING — CT CT HEAD W/O CM
3 of 4 series · 13 of 47 positions shown, 15 images · non-contrast
Comparison: 02/16/2021

CLINICAL DATA: Chronic headache

EXAM:
CT HEAD WITHOUT CONTRAST
TECHNIQUE: Contiguous axial images were obtained from the base of the skull
through the vertex without intravenous contrast.

[Series 3: head without · axial · non-contrast · 0.48mm/px · z∈[-174,-30]mm · 7 of 39 slices shown, 9 images]
[im 5/39  brain]
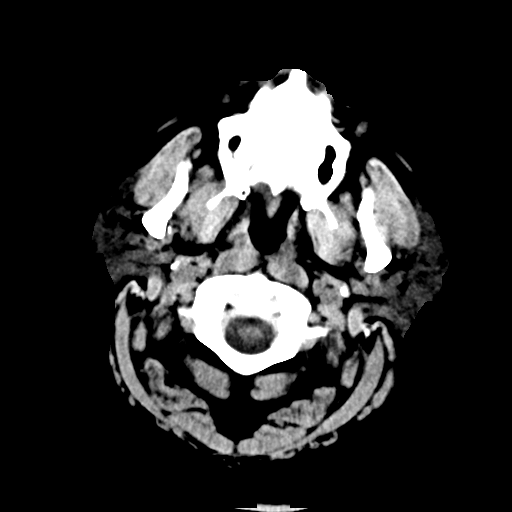
[im 5/39  bone]
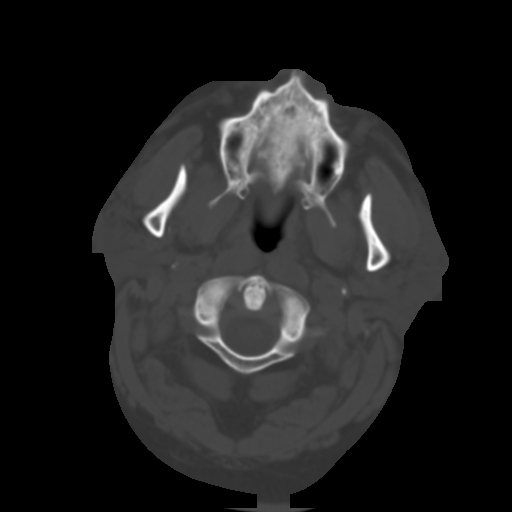
[im 10/39  brain]
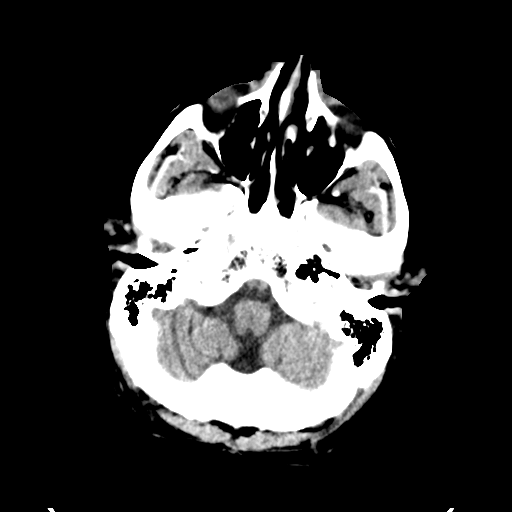
[im 15/39  brain]
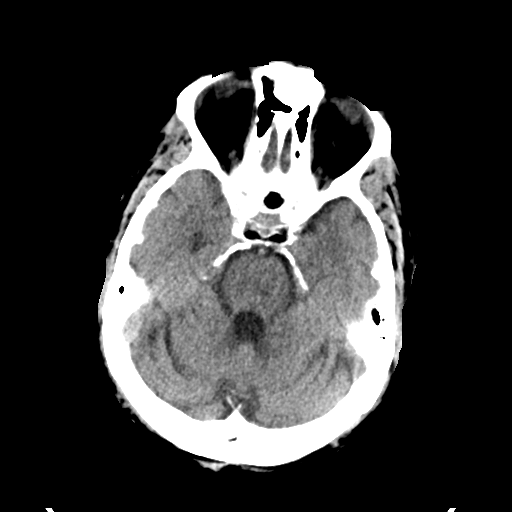
[im 20/39  brain]
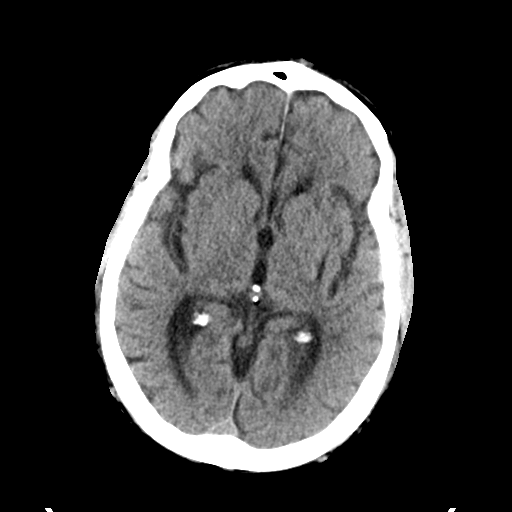
[im 24/39  brain]
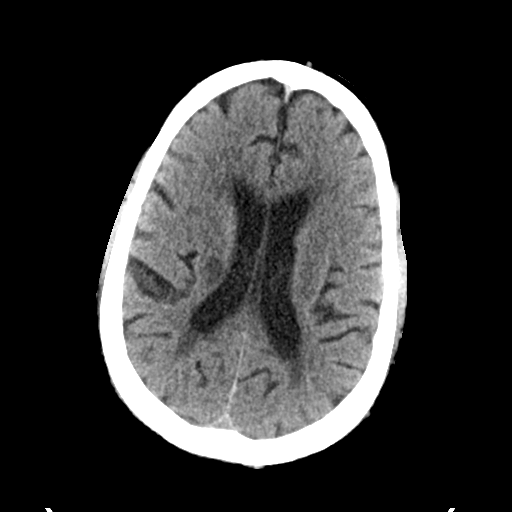
[im 24/39  bone]
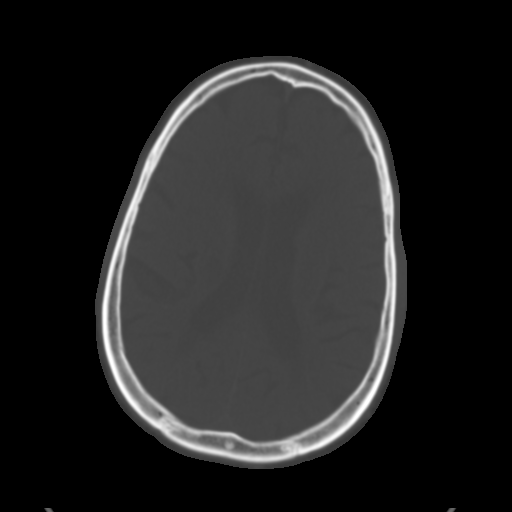
[im 29/39  brain]
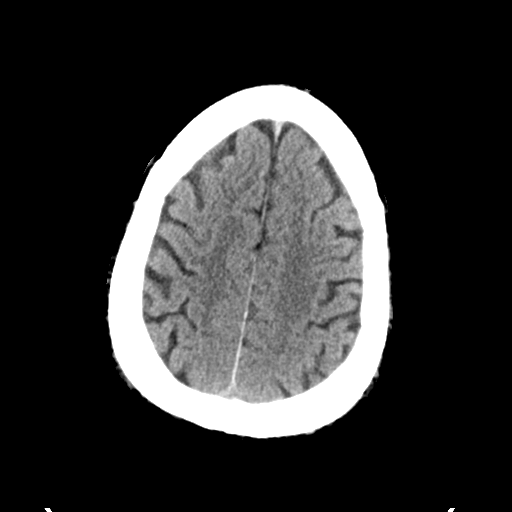
[im 34/39  brain]
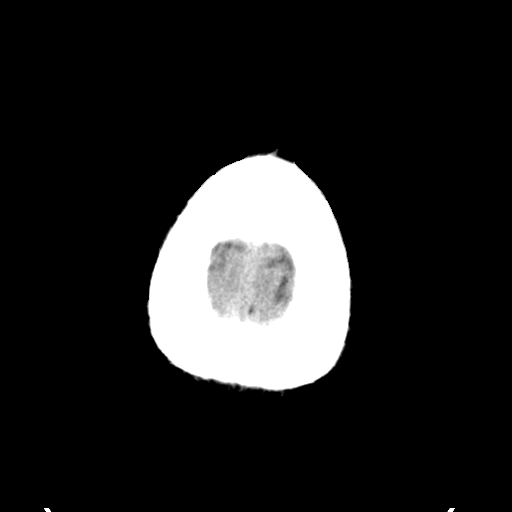

[Series 5: head without cor · coronal · non-contrast · 0.38mm/px · 3 of 77 slices shown]
[im 26/77  brain]
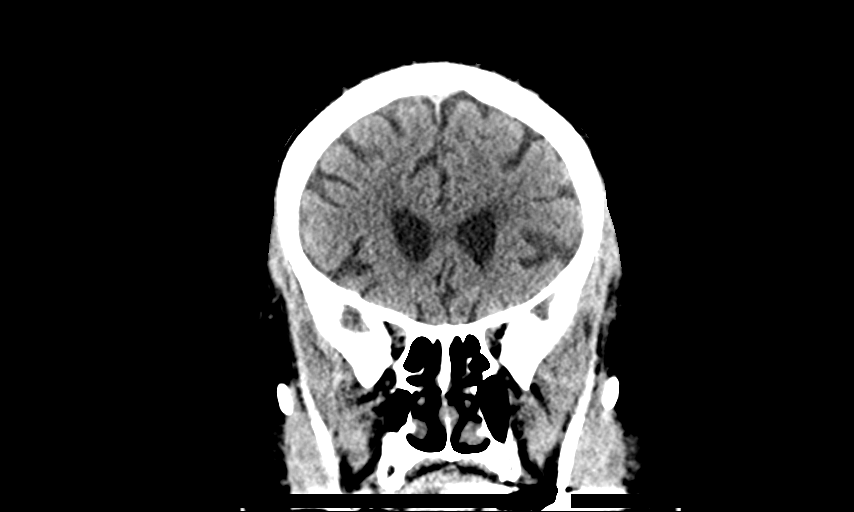
[im 34/77  brain]
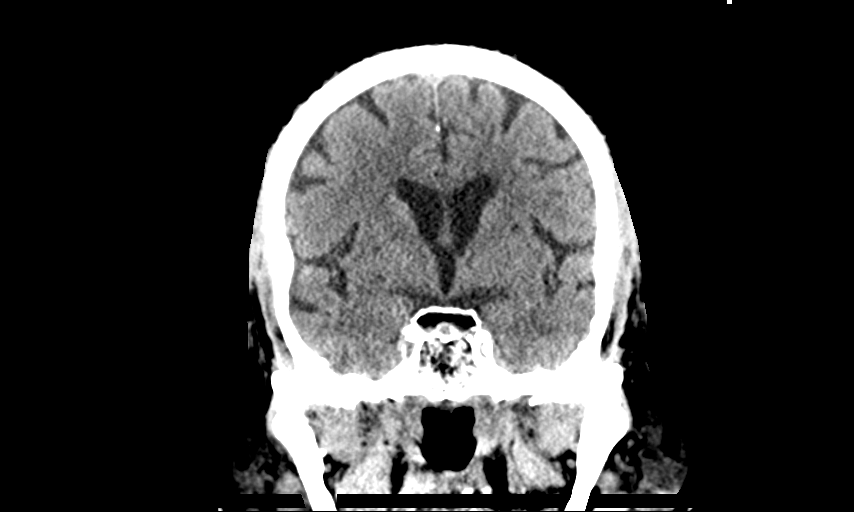
[im 43/77  brain]
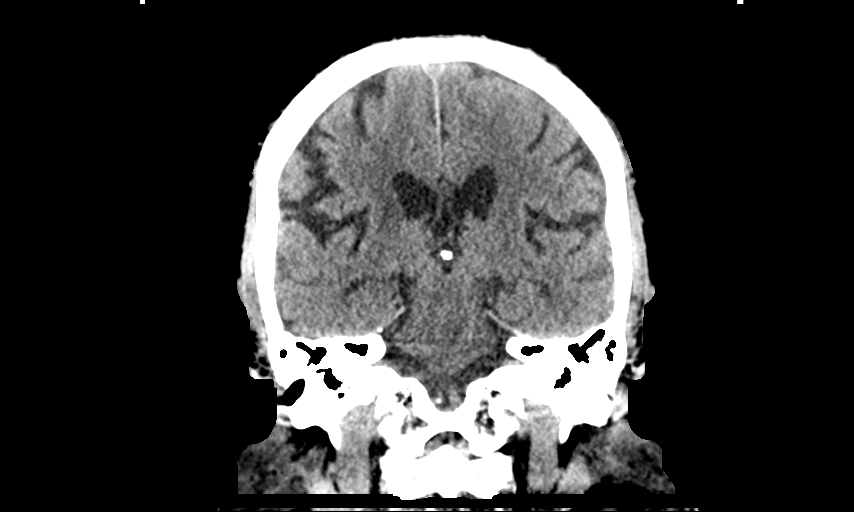

[Series 6: head without sag · sagittal · non-contrast · 0.38mm/px · 3 of 67 slices shown]
[im 23/67  brain]
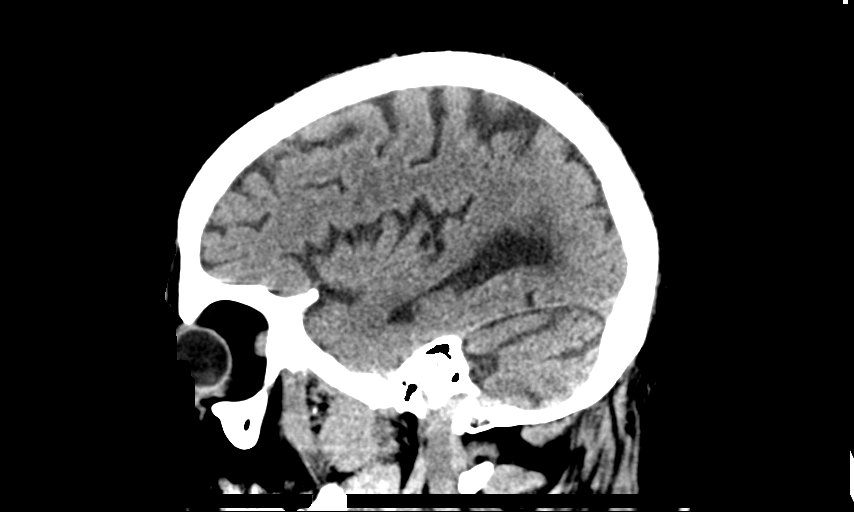
[im 34/67  brain]
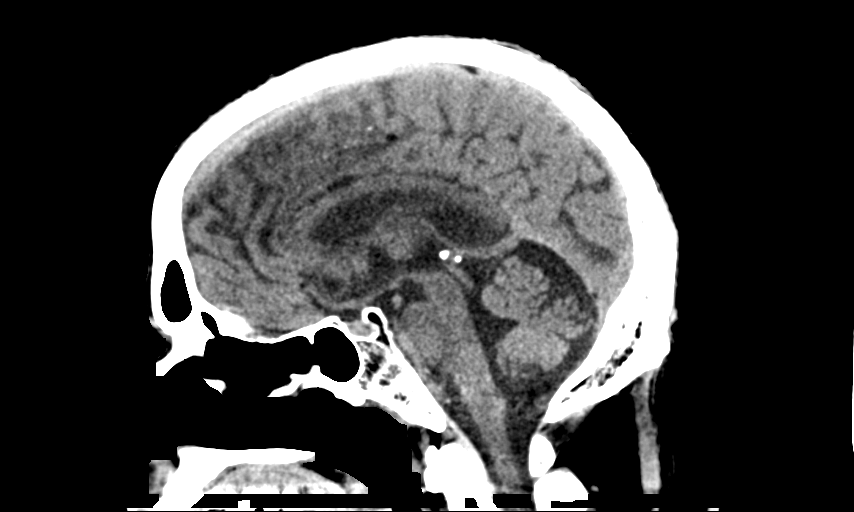
[im 45/67  brain]
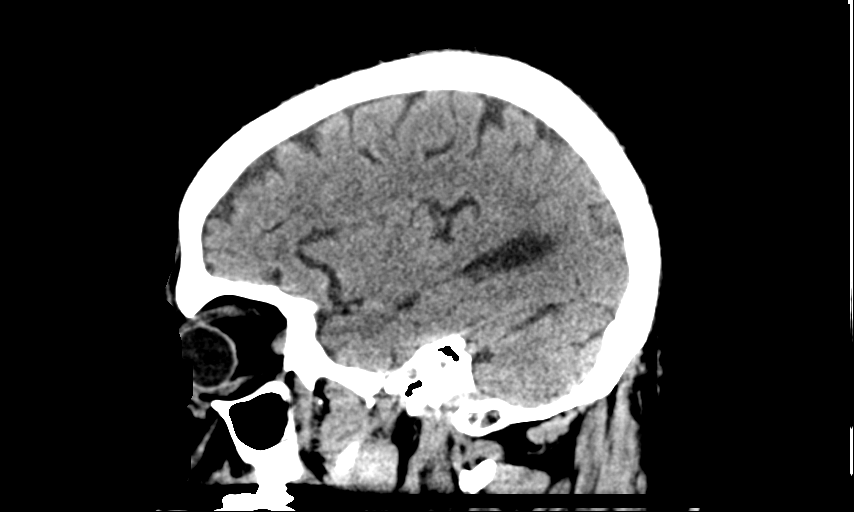

[13 of 47 positions shown; findings below may reference images not displayed]

FINDINGS: Brain: There is no mass, hemorrhage or extra-axial collection. There
is generalized atrophy without lobar predilection. Hypodensity of
the white matter is most commonly associated with chronic
microvascular disease. There is an old infarct of the posterior
right basal ganglia.

Vascular: Atherosclerotic calcification of the vertebral and
internal carotid arteries at the skull base. No abnormal
hyperdensity of the major intracranial arteries or dural venous
sinuses.

Skull: The visualized skull base, calvarium and extracranial soft
tissues are normal.

Sinuses/Orbits: No fluid levels or advanced mucosal thickening of
the visualized paranasal sinuses. No mastoid or middle ear effusion.
The orbits are normal.
IMPRESSION: 1. No acute intracranial abnormality.
2. Old right basal ganglia infarct and findings of chronic
microvascular ischemia.

## 2022-06-04 IMAGING — DX DG CHEST 1V PORT
1 series · 1 of 1 positions shown · non-contrast
Comparison: Chest radiograph 03/18/2021

CLINICAL DATA: Hypoxia, shortness of breath

EXAM:
PORTABLE CHEST 1 VIEW

[chest ap]
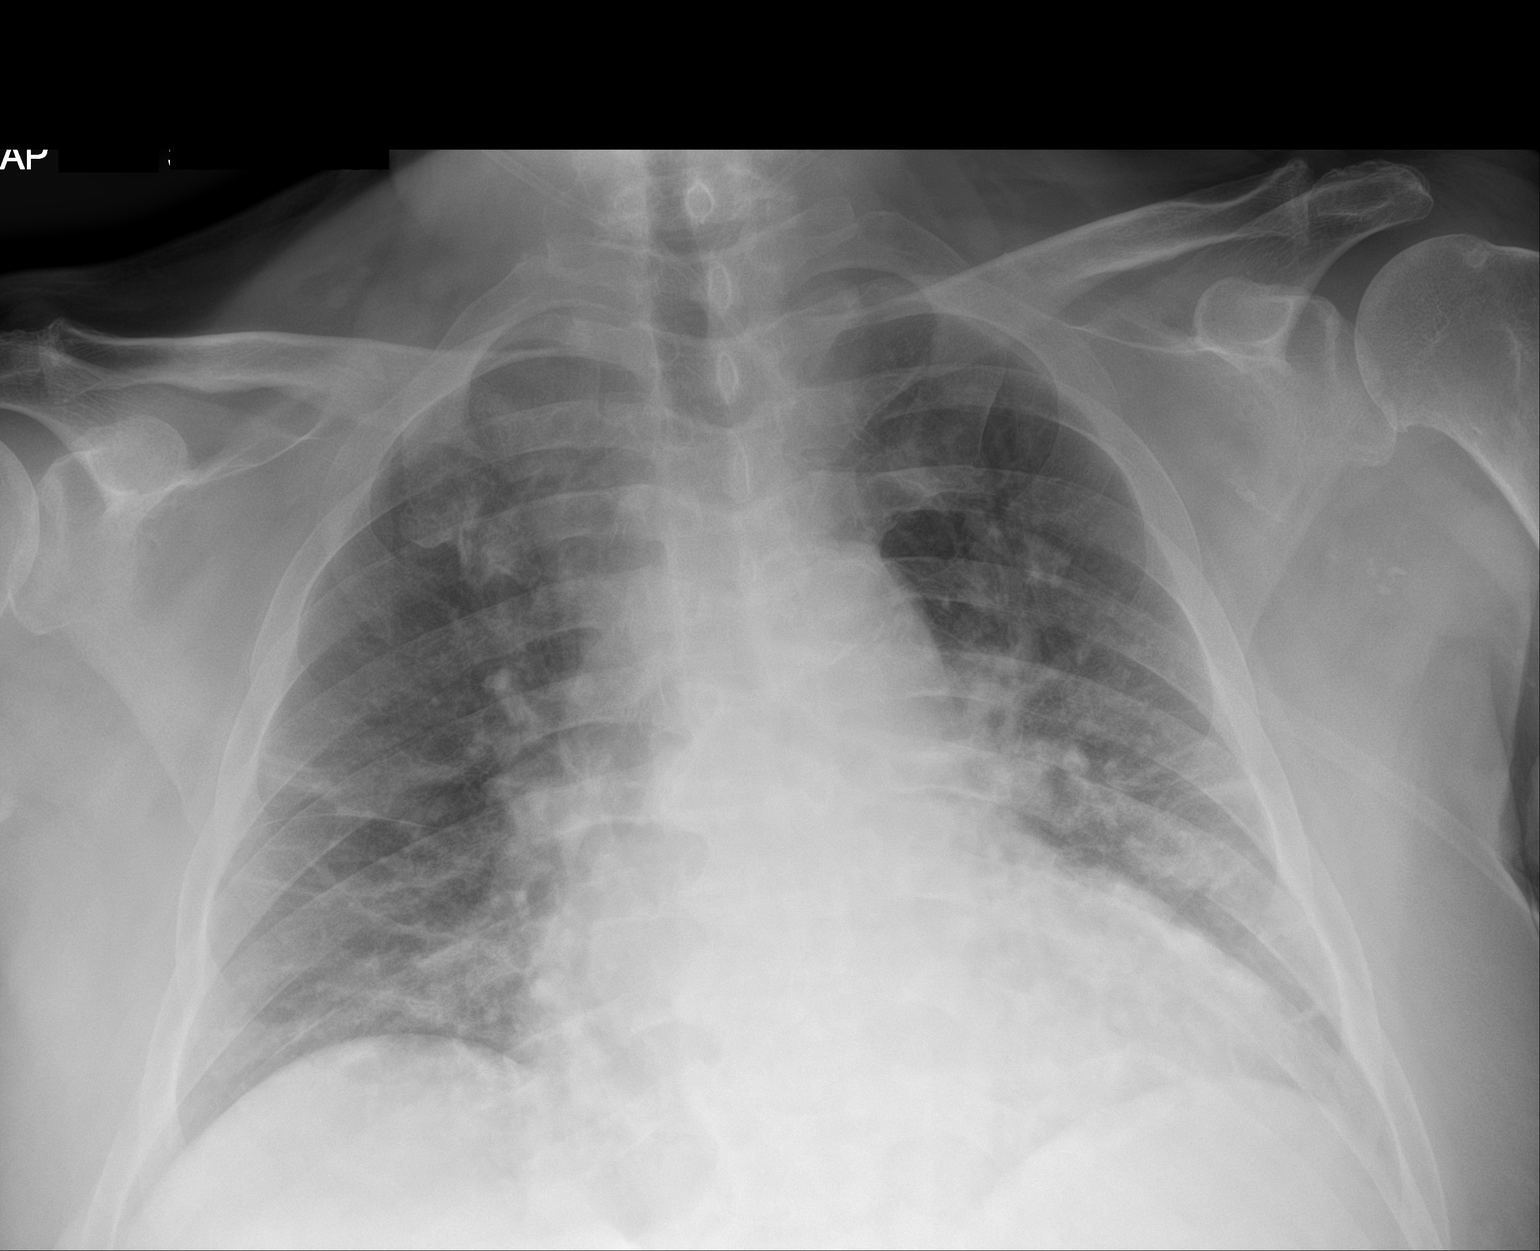

[1 of 1 positions shown; findings below may reference images not displayed]

FINDINGS: The heart is enlarged, unchanged. The mediastinal contours are
stable.

Linear opacities in both mid lungs are similar to prior studies and
are favored to reflect scarring. Previously seen patchy opacities in
the medial right base have improved. There is no new or worsening
focal airspace disease. There is no significant pleural effusion.
There is no pneumothorax.

There is no acute osseous abnormality.
IMPRESSION: Previously seen opacities in the medial right base have improved.
Additional linear opacities in both lungs are unchanged and are
favored to reflect scarring. No new or worsening focal airspace
disease.
